# Patient Record
Sex: Male | Born: 1955
Health system: Southern US, Community
[De-identification: ages and names within clinical notes are randomized; demographics above are authoritative.]

## PROBLEM LIST (undated history)

## (undated) ENCOUNTER — Emergency Department (HOSPITAL_COMMUNITY): Discharge status: Absent without leave | Payer: Self-pay

## (undated) DIAGNOSIS — K219 Gastro-esophageal reflux disease without esophagitis: Secondary | ICD-10-CM

## (undated) DIAGNOSIS — R9431 Abnormal electrocardiogram [ECG] [EKG]: Secondary | ICD-10-CM

## (undated) DIAGNOSIS — R519 Headache, unspecified: Secondary | ICD-10-CM

## (undated) DIAGNOSIS — G4730 Sleep apnea, unspecified: Secondary | ICD-10-CM

## (undated) DIAGNOSIS — S02609A Fracture of mandible, unspecified, initial encounter for closed fracture: Secondary | ICD-10-CM

## (undated) DIAGNOSIS — F1910 Other psychoactive substance abuse, uncomplicated: Secondary | ICD-10-CM

## (undated) DIAGNOSIS — N289 Disorder of kidney and ureter, unspecified: Secondary | ICD-10-CM

## (undated) DIAGNOSIS — N189 Chronic kidney disease, unspecified: Secondary | ICD-10-CM

## (undated) DIAGNOSIS — I10 Essential (primary) hypertension: Secondary | ICD-10-CM

## (undated) DIAGNOSIS — B1920 Unspecified viral hepatitis C without hepatic coma: Secondary | ICD-10-CM

## (undated) HISTORY — PX: HAND SURGERY: SHX662

## (undated) HISTORY — DX: Unspecified viral hepatitis C without hepatic coma: B19.20

## (undated) HISTORY — PX: BRAIN SURGERY: SHX531

## (undated) HISTORY — PX: SALIVARY GLAND SURGERY: SHX768

---

## 2007-02-13 ENCOUNTER — Emergency Department (HOSPITAL_COMMUNITY): Admission: EM | Admit: 2007-02-13 | Discharge: 2007-02-13 | Payer: Self-pay | Admitting: Emergency Medicine

## 2008-11-22 ENCOUNTER — Emergency Department (HOSPITAL_COMMUNITY): Admission: EM | Admit: 2008-11-22 | Discharge: 2008-11-22 | Payer: Self-pay | Admitting: Emergency Medicine

## 2009-11-22 ENCOUNTER — Emergency Department (HOSPITAL_COMMUNITY): Admission: EM | Admit: 2009-11-22 | Discharge: 2009-11-23 | Payer: Self-pay | Admitting: Emergency Medicine

## 2009-11-28 ENCOUNTER — Inpatient Hospital Stay (HOSPITAL_COMMUNITY): Admission: EM | Admit: 2009-11-28 | Discharge: 2009-12-01 | Payer: Self-pay | Admitting: Emergency Medicine

## 2009-11-28 ENCOUNTER — Ambulatory Visit: Payer: Self-pay | Admitting: Cardiovascular Disease

## 2009-11-29 ENCOUNTER — Encounter (INDEPENDENT_AMBULATORY_CARE_PROVIDER_SITE_OTHER): Payer: Self-pay | Admitting: Internal Medicine

## 2009-12-08 ENCOUNTER — Ambulatory Visit: Payer: Self-pay | Admitting: Internal Medicine

## 2010-01-25 ENCOUNTER — Encounter: Payer: Self-pay | Admitting: Cardiovascular Disease

## 2010-01-25 ENCOUNTER — Ambulatory Visit: Payer: Self-pay | Admitting: Cardiovascular Disease

## 2010-01-25 ENCOUNTER — Inpatient Hospital Stay (HOSPITAL_COMMUNITY): Admission: EM | Admit: 2010-01-25 | Discharge: 2010-01-27 | Payer: Self-pay | Admitting: Emergency Medicine

## 2010-01-30 ENCOUNTER — Emergency Department (HOSPITAL_COMMUNITY): Admission: EM | Admit: 2010-01-30 | Discharge: 2010-01-30 | Payer: Self-pay | Admitting: Emergency Medicine

## 2010-02-01 ENCOUNTER — Encounter (INDEPENDENT_AMBULATORY_CARE_PROVIDER_SITE_OTHER): Payer: Self-pay | Admitting: *Deleted

## 2010-07-31 NOTE — Letter (Signed)
Summary: Appointment - Missed  [facility], [place]  [address] [place]   [place], [place] [address]   Phone: [phone]  Fax: [phone]     February 01, 2010 MRN: [id number]   Corey Guerrero [place], [place]  [address]   Dear [name],  [facility] records indicate you  need to make your post hospital appointment and to get lab work done with Dr. Burt Knack.  It is very important that we reach you to schedule this appointment. We look forward to participating in your health care needs. Please contact us at the number listed above at your earliest convenience to schedule this appointment.     Sincerely, Acupuncturist Team LG

## 2010-09-14 LAB — CBC
HCT: 37.9 % — ABNORMAL LOW (ref 39.0–52.0)
Hemoglobin: 12.7 g/dL — ABNORMAL LOW (ref 13.0–17.0)
MCH: 32.2 pg (ref 26.0–34.0)
MCHC: 33.5 g/dL (ref 30.0–36.0)
MCV: 95.9 fL (ref 78.0–100.0)
Platelets: 155 10*3/uL (ref 150–400)
RBC: 3.95 MIL/uL — ABNORMAL LOW (ref 4.22–5.81)
RDW: 12.1 % (ref 11.5–15.5)
WBC: 3.9 10*3/uL — ABNORMAL LOW (ref 4.0–10.5)

## 2010-09-14 LAB — POCT CARDIAC MARKERS
CKMB, poc: 3 ng/mL (ref 1.0–8.0)
Myoglobin, poc: 81.1 ng/mL (ref 12–200)
Troponin i, poc: <0.05 ng/mL (ref 0.00–0.09)

## 2010-09-14 LAB — BASIC METABOLIC PANEL
BUN: 18 mg/dL (ref 6–23)
CO2: 30 mEq/L (ref 19–32)
Calcium: 9.1 mg/dL (ref 8.4–10.5)
Chloride: 107 mEq/L (ref 96–112)
Creatinine, Ser: 1.4 mg/dL (ref 0.4–1.5)
GFR calc Af Amer: >60 mL/min (ref >60)
GFR calc non Af Amer: 53 mL/min — ABNORMAL LOW (ref >60)
Glucose, Bld: 104 mg/dL — ABNORMAL HIGH (ref 70–99)
Potassium: 4.1 mEq/L (ref 3.5–5.1)
Sodium: 142 mEq/L (ref 135–145)

## 2010-09-14 LAB — DIFFERENTIAL
Basophils Absolute: 0 10*3/uL (ref 0.0–0.1)
Basophils Relative: 0 % (ref 0–1)
Eosinophils Absolute: 0 10*3/uL (ref 0.0–0.7)
Eosinophils Relative: 1 % (ref 0–5)
Lymphocytes Relative: 26 % (ref 12–46)
Lymphs Abs: 1 10*3/uL (ref 0.7–4.0)
Monocytes Absolute: 0.4 10*3/uL (ref 0.1–1.0)
Monocytes Relative: 9 % (ref 3–12)
Neutro Abs: 2.5 10*3/uL (ref 1.7–7.7)
Neutrophils Relative %: 64 % (ref 43–77)

## 2010-09-15 LAB — CBC
HCT: 35.5 % — ABNORMAL LOW (ref 39.0–52.0)
HCT: 40 % (ref 39.0–52.0)
Hemoglobin: 12.1 g/dL — ABNORMAL LOW (ref 13.0–17.0)
Hemoglobin: 13.6 g/dL (ref 13.0–17.0)
MCH: 33.4 pg (ref 26.0–34.0)
MCH: 33.4 pg (ref 26.0–34.0)
MCHC: 33.9 g/dL (ref 30.0–36.0)
MCHC: 34.1 g/dL (ref 30.0–36.0)
MCV: 97.8 fL (ref 78.0–100.0)
MCV: 98.6 fL (ref 78.0–100.0)
Platelets: 134 10*3/uL — ABNORMAL LOW (ref 150–400)
Platelets: 156 10*3/uL (ref 150–400)
RBC: 3.63 MIL/uL — ABNORMAL LOW (ref 4.22–5.81)
RBC: 4.06 MIL/uL — ABNORMAL LOW (ref 4.22–5.81)
RDW: 12.8 % (ref 11.5–15.5)
RDW: 12.8 % (ref 11.5–15.5)
WBC: 4.3 10*3/uL (ref 4.0–10.5)
WBC: 4.8 10*3/uL (ref 4.0–10.5)

## 2010-09-15 LAB — CARDIAC PANEL(CRET KIN+CKTOT+MB+TROPI)
CK, MB: 4.1 ng/mL — ABNORMAL HIGH (ref 0.3–4.0)
CK, MB: 4.8 ng/mL — ABNORMAL HIGH (ref 0.3–4.0)
Relative Index: 1.4 (ref 0.0–2.5)
Relative Index: 1.6 (ref 0.0–2.5)
Total CK: 258 U/L — ABNORMAL HIGH (ref 7–232)
Total CK: 338 U/L — ABNORMAL HIGH (ref 7–232)
Troponin I: 0.04 ng/mL (ref 0.00–0.06)
Troponin I: 0.05 ng/mL (ref 0.00–0.06)

## 2010-09-15 LAB — RAPID URINE DRUG SCREEN, HOSP PERFORMED
Amphetamines: NOT DETECTED
Barbiturates: NOT DETECTED
Benzodiazepines: NOT DETECTED
Cocaine: POSITIVE — AB
Opiates: POSITIVE — AB
Tetrahydrocannabinol: NOT DETECTED

## 2010-09-15 LAB — POCT I-STAT, CHEM 8
BUN: 16 mg/dL (ref 6–23)
Calcium, Ion: 1.03 mmol/L — ABNORMAL LOW (ref 1.12–1.32)
Chloride: 105 mEq/L (ref 96–112)
Creatinine, Ser: 1.5 mg/dL (ref 0.4–1.5)
Glucose, Bld: 127 mg/dL — ABNORMAL HIGH (ref 70–99)
HCT: 42 % (ref 39.0–52.0)
Hemoglobin: 14.3 g/dL (ref 13.0–17.0)
Potassium: 3.6 mEq/L (ref 3.5–5.1)
Sodium: 142 mEq/L (ref 135–145)
TCO2: 28 mmol/L (ref 0–100)

## 2010-09-15 LAB — BASIC METABOLIC PANEL
BUN: 11 mg/dL (ref 6–23)
BUN: 11 mg/dL (ref 6–23)
BUN: 13 mg/dL (ref 6–23)
BUN: 13 mg/dL (ref 6–23)
CO2: 28 mEq/L (ref 19–32)
CO2: 28 mEq/L (ref 19–32)
CO2: 28 mEq/L (ref 19–32)
CO2: 31 mEq/L (ref 19–32)
Calcium: 8.4 mg/dL (ref 8.4–10.5)
Calcium: 8.9 mg/dL (ref 8.4–10.5)
Calcium: 8.9 mg/dL (ref 8.4–10.5)
Calcium: 9.1 mg/dL (ref 8.4–10.5)
Chloride: 104 mEq/L (ref 96–112)
Chloride: 96 mEq/L (ref 96–112)
Chloride: 97 mEq/L (ref 96–112)
Chloride: 99 mEq/L (ref 96–112)
Creatinine, Ser: 1.31 mg/dL (ref 0.4–1.5)
Creatinine, Ser: 1.35 mg/dL (ref 0.4–1.5)
Creatinine, Ser: 1.41 mg/dL (ref 0.4–1.5)
Creatinine, Ser: 1.53 mg/dL — ABNORMAL HIGH (ref 0.4–1.5)
GFR calc Af Amer: 58 mL/min — ABNORMAL LOW (ref >60)
GFR calc Af Amer: >60 mL/min (ref >60)
GFR calc Af Amer: >60 mL/min (ref >60)
GFR calc Af Amer: >60 mL/min (ref >60)
GFR calc non Af Amer: 48 mL/min — ABNORMAL LOW (ref >60)
GFR calc non Af Amer: 53 mL/min — ABNORMAL LOW (ref >60)
GFR calc non Af Amer: 56 mL/min — ABNORMAL LOW (ref >60)
GFR calc non Af Amer: 57 mL/min — ABNORMAL LOW (ref >60)
Glucose, Bld: 132 mg/dL — ABNORMAL HIGH (ref 70–99)
Glucose, Bld: 93 mg/dL (ref 70–99)
Glucose, Bld: 96 mg/dL (ref 70–99)
Glucose, Bld: 99 mg/dL (ref 70–99)
Potassium: 2.9 mEq/L — ABNORMAL LOW (ref 3.5–5.1)
Potassium: 3.3 mEq/L — ABNORMAL LOW (ref 3.5–5.1)
Potassium: 3.5 mEq/L (ref 3.5–5.1)
Potassium: 3.6 mEq/L (ref 3.5–5.1)
Sodium: 131 mEq/L — ABNORMAL LOW (ref 135–145)
Sodium: 133 mEq/L — ABNORMAL LOW (ref 135–145)
Sodium: 135 mEq/L (ref 135–145)
Sodium: 140 mEq/L (ref 135–145)

## 2010-09-15 LAB — CK TOTAL AND CKMB (NOT AT ARMC)
CK, MB: 6.1 ng/mL (ref 0.3–4.0)
Relative Index: 1.3 (ref 0.0–2.5)
Total CK: 471 U/L — ABNORMAL HIGH (ref 7–232)

## 2010-09-15 LAB — POCT CARDIAC MARKERS
CKMB, poc: 2.8 ng/mL (ref 1.0–8.0)
Myoglobin, poc: 120 ng/mL (ref 12–200)
Troponin i, poc: <0.05 ng/mL (ref 0.00–0.09)

## 2010-09-15 LAB — DIFFERENTIAL
Basophils Absolute: 0 10*3/uL (ref 0.0–0.1)
Basophils Relative: 0 % (ref 0–1)
Eosinophils Absolute: 0 10*3/uL (ref 0.0–0.7)
Eosinophils Relative: 1 % (ref 0–5)
Lymphocytes Relative: 27 % (ref 12–46)
Lymphs Abs: 1.3 10*3/uL (ref 0.7–4.0)
Monocytes Absolute: 0.3 10*3/uL (ref 0.1–1.0)
Monocytes Relative: 7 % (ref 3–12)
Neutro Abs: 3.1 10*3/uL (ref 1.7–7.7)
Neutrophils Relative %: 65 % (ref 43–77)

## 2010-09-15 LAB — MRSA PCR SCREENING: MRSA by PCR: NEGATIVE

## 2010-09-15 LAB — TROPONIN I: Troponin I: 0.05 ng/mL (ref 0.00–0.06)

## 2010-09-17 LAB — BASIC METABOLIC PANEL
BUN: 10 mg/dL (ref 6–23)
BUN: 16 mg/dL (ref 6–23)
BUN: 8 mg/dL (ref 6–23)
BUN: 34 mg/dL — ABNORMAL HIGH (ref 6–23)
CO2: 30 mEq/L (ref 19–32)
CO2: 33 mEq/L — ABNORMAL HIGH (ref 19–32)
CO2: 34 mEq/L — ABNORMAL HIGH (ref 19–32)
CO2: 28 mEq/L (ref 19–32)
Calcium: 8.7 mg/dL (ref 8.4–10.5)
Calcium: 9.4 mg/dL (ref 8.4–10.5)
Calcium: 9.5 mg/dL (ref 8.4–10.5)
Calcium: 9.5 mg/dL (ref 8.4–10.5)
Chloride: 100 mEq/L (ref 96–112)
Chloride: 101 mEq/L (ref 96–112)
Chloride: 98 mEq/L (ref 96–112)
Chloride: 95 mEq/L — ABNORMAL LOW (ref 96–112)
Creatinine, Ser: 1.44 mg/dL (ref 0.4–1.5)
Creatinine, Ser: 1.53 mg/dL — ABNORMAL HIGH (ref 0.4–1.5)
Creatinine, Ser: 1.58 mg/dL — ABNORMAL HIGH (ref 0.4–1.5)
Creatinine, Ser: 3.48 mg/dL — ABNORMAL HIGH (ref 0.4–1.5)
GFR calc Af Amer: 56 mL/min — ABNORMAL LOW (ref >60)
GFR calc Af Amer: 58 mL/min — ABNORMAL LOW (ref >60)
GFR calc Af Amer: >60 mL/min (ref >60)
GFR calc Af Amer: 23 mL/min — ABNORMAL LOW (ref >60)
GFR calc non Af Amer: 46 mL/min — ABNORMAL LOW (ref >60)
GFR calc non Af Amer: 48 mL/min — ABNORMAL LOW (ref >60)
GFR calc non Af Amer: 52 mL/min — ABNORMAL LOW (ref >60)
GFR calc non Af Amer: 19 mL/min — ABNORMAL LOW (ref >60)
Glucose, Bld: 76 mg/dL (ref 70–99)
Glucose, Bld: 91 mg/dL (ref 70–99)
Glucose, Bld: 97 mg/dL (ref 70–99)
Glucose, Bld: 96 mg/dL (ref 70–99)
Potassium: 3.5 mEq/L (ref 3.5–5.1)
Potassium: 3.5 mEq/L (ref 3.5–5.1)
Potassium: 3.6 mEq/L (ref 3.5–5.1)
Potassium: 4.2 mEq/L (ref 3.5–5.1)
Sodium: 137 mEq/L (ref 135–145)
Sodium: 137 mEq/L (ref 135–145)
Sodium: 139 mEq/L (ref 135–145)
Sodium: 135 mEq/L (ref 135–145)

## 2010-09-17 LAB — COMPREHENSIVE METABOLIC PANEL
ALT: 33 U/L (ref 0–53)
AST: 49 U/L — ABNORMAL HIGH (ref 0–37)
Albumin: 4.3 g/dL (ref 3.5–5.2)
Alkaline Phosphatase: 51 U/L (ref 39–117)
BUN: 18 mg/dL (ref 6–23)
CO2: 26 mEq/L (ref 19–32)
Calcium: 8.9 mg/dL (ref 8.4–10.5)
Chloride: 109 mEq/L (ref 96–112)
Creatinine, Ser: 1.65 mg/dL — ABNORMAL HIGH (ref 0.4–1.5)
GFR calc Af Amer: 53 mL/min — ABNORMAL LOW (ref >60)
GFR calc non Af Amer: 44 mL/min — ABNORMAL LOW (ref >60)
Glucose, Bld: 96 mg/dL (ref 70–99)
Potassium: 3.4 mEq/L — ABNORMAL LOW (ref 3.5–5.1)
Sodium: 143 mEq/L (ref 135–145)
Total Bilirubin: 0.6 mg/dL (ref 0.3–1.2)
Total Protein: 7.8 g/dL (ref 6.0–8.3)

## 2010-09-17 LAB — CARDIAC PANEL(CRET KIN+CKTOT+MB+TROPI)
CK, MB: 4.4 ng/mL — ABNORMAL HIGH (ref 0.3–4.0)
CK, MB: 8.9 ng/mL (ref 0.3–4.0)
Relative Index: 0.6 (ref 0.0–2.5)
Relative Index: 1.2 (ref 0.0–2.5)
Total CK: 715 U/L — ABNORMAL HIGH (ref 7–232)
Total CK: 741 U/L — ABNORMAL HIGH (ref 7–232)
Troponin I: 0.05 ng/mL (ref 0.00–0.06)
Troponin I: 0.06 ng/mL (ref 0.00–0.06)

## 2010-09-17 LAB — RAPID URINE DRUG SCREEN, HOSP PERFORMED
Amphetamines: NOT DETECTED
Barbiturates: NOT DETECTED
Benzodiazepines: NOT DETECTED
Cocaine: POSITIVE — AB
Opiates: POSITIVE — AB
Tetrahydrocannabinol: NOT DETECTED

## 2010-09-17 LAB — CBC
HCT: 34.5 % — ABNORMAL LOW (ref 39.0–52.0)
HCT: 38.9 % — ABNORMAL LOW (ref 39.0–52.0)
HCT: 41.2 % (ref 39.0–52.0)
Hemoglobin: 11.6 g/dL — ABNORMAL LOW (ref 13.0–17.0)
Hemoglobin: 12.8 g/dL — ABNORMAL LOW (ref 13.0–17.0)
Hemoglobin: 13.7 g/dL (ref 13.0–17.0)
MCHC: 33.1 g/dL (ref 30.0–36.0)
MCHC: 32.9 g/dL (ref 30.0–36.0)
MCHC: 33.3 g/dL (ref 30.0–36.0)
MCV: 98.9 fL (ref 78.0–100.0)
MCV: 98.8 fL (ref 78.0–100.0)
MCV: 99 fL (ref 78.0–100.0)
Platelets: 157 10*3/uL (ref 150–400)
Platelets: 180 10*3/uL (ref 150–400)
Platelets: 216 10*3/uL (ref 150–400)
RBC: 3.49 MIL/uL — ABNORMAL LOW (ref 4.22–5.81)
RBC: 3.93 MIL/uL — ABNORMAL LOW (ref 4.22–5.81)
RBC: 4.17 MIL/uL — ABNORMAL LOW (ref 4.22–5.81)
RDW: 13.1 % (ref 11.5–15.5)
RDW: 13 % (ref 11.5–15.5)
RDW: 13.1 % (ref 11.5–15.5)
WBC: 5.2 10*3/uL (ref 4.0–10.5)
WBC: 6.1 10*3/uL (ref 4.0–10.5)
WBC: 7.9 10*3/uL (ref 4.0–10.5)

## 2010-09-17 LAB — CK TOTAL AND CKMB (NOT AT ARMC)
CK, MB: 9.1 ng/mL (ref 0.3–4.0)
Relative Index: 1.1 (ref 0.0–2.5)
Total CK: 813 U/L — ABNORMAL HIGH (ref 7–232)

## 2010-09-17 LAB — APTT: aPTT: 28 seconds (ref 24–37)

## 2010-09-17 LAB — ETHANOL: Alcohol, Ethyl (B): 114 mg/dL — ABNORMAL HIGH (ref 0–10)

## 2010-09-17 LAB — DIFFERENTIAL
Basophils Absolute: 0 10*3/uL (ref 0.0–0.1)
Basophils Relative: 0 % (ref 0–1)
Eosinophils Absolute: 0 10*3/uL (ref 0.0–0.7)
Eosinophils Relative: 0 % (ref 0–5)
Lymphocytes Relative: 22 % (ref 12–46)
Lymphs Abs: 1.7 10*3/uL (ref 0.7–4.0)
Monocytes Absolute: 0.7 10*3/uL (ref 0.1–1.0)
Monocytes Relative: 8 % (ref 3–12)
Neutro Abs: 5.5 10*3/uL (ref 1.7–7.7)
Neutrophils Relative %: 69 % (ref 43–77)

## 2010-09-17 LAB — URINALYSIS, ROUTINE W REFLEX MICROSCOPIC
Glucose, UA: NEGATIVE mg/dL
Ketones, ur: NEGATIVE mg/dL
Leukocytes, UA: NEGATIVE
Nitrite: NEGATIVE
Protein, ur: 100 mg/dL — AB
Specific Gravity, Urine: 1.037 — ABNORMAL HIGH (ref 1.005–1.030)
Urobilinogen, UA: 0.2 mg/dL (ref 0.0–1.0)
pH: 5 (ref 5.0–8.0)

## 2010-09-17 LAB — URINE MICROSCOPIC-ADD ON

## 2010-09-17 LAB — PROTIME-INR
INR: 1.03 (ref 0.00–1.49)
Prothrombin Time: 13.4 seconds (ref 11.6–15.2)

## 2010-09-17 LAB — POCT CARDIAC MARKERS
CKMB, poc: 9.6 ng/mL (ref 1.0–8.0)
Myoglobin, poc: >500 ng/mL (ref 12–200)
Troponin i, poc: <0.05 ng/mL (ref 0.00–0.09)

## 2010-09-17 LAB — TROPONIN I: Troponin I: 0.1 ng/mL — ABNORMAL HIGH (ref 0.00–0.06)

## 2010-09-17 LAB — SAMPLE TO BLOOD BANK

## 2010-10-09 LAB — RAPID STREP SCREEN (MED CTR MEBANE ONLY): Streptococcus, Group A Screen (Direct): NEGATIVE

## 2011-05-20 ENCOUNTER — Other Ambulatory Visit: Payer: Self-pay

## 2011-05-20 ENCOUNTER — Encounter: Payer: Self-pay | Admitting: *Deleted

## 2011-05-20 ENCOUNTER — Inpatient Hospital Stay (HOSPITAL_COMMUNITY)
Admission: EM | Admit: 2011-05-20 | Discharge: 2011-05-28 | DRG: 683 | Disposition: A | Discharge status: Other Acute Inpatient Hospital | Payer: Self-pay | Attending: Internal Medicine | Admitting: Internal Medicine

## 2011-05-20 DIAGNOSIS — F1010 Alcohol abuse, uncomplicated: Secondary | ICD-10-CM

## 2011-05-20 DIAGNOSIS — N183 Chronic kidney disease, stage 3 unspecified: Secondary | ICD-10-CM | POA: Diagnosis present

## 2011-05-20 DIAGNOSIS — I10 Essential (primary) hypertension: Secondary | ICD-10-CM | POA: Diagnosis present

## 2011-05-20 DIAGNOSIS — F1410 Cocaine abuse, uncomplicated: Secondary | ICD-10-CM | POA: Diagnosis present

## 2011-05-20 DIAGNOSIS — E876 Hypokalemia: Secondary | ICD-10-CM | POA: Diagnosis present

## 2011-05-20 DIAGNOSIS — R748 Abnormal levels of other serum enzymes: Secondary | ICD-10-CM | POA: Diagnosis present

## 2011-05-20 DIAGNOSIS — I129 Hypertensive chronic kidney disease with stage 1 through stage 4 chronic kidney disease, or unspecified chronic kidney disease: Principal | ICD-10-CM | POA: Diagnosis present

## 2011-05-20 DIAGNOSIS — Z9119 Patient's noncompliance with other medical treatment and regimen: Secondary | ICD-10-CM

## 2011-05-20 DIAGNOSIS — R9431 Abnormal electrocardiogram [ECG] [EKG]: Secondary | ICD-10-CM | POA: Diagnosis present

## 2011-05-20 DIAGNOSIS — Z91199 Patient's noncompliance with other medical treatment and regimen due to unspecified reason: Secondary | ICD-10-CM

## 2011-05-20 DIAGNOSIS — N289 Disorder of kidney and ureter, unspecified: Secondary | ICD-10-CM

## 2011-05-20 DIAGNOSIS — N179 Acute kidney failure, unspecified: Secondary | ICD-10-CM | POA: Diagnosis present

## 2011-05-20 DIAGNOSIS — F1910 Other psychoactive substance abuse, uncomplicated: Secondary | ICD-10-CM | POA: Diagnosis present

## 2011-05-20 DIAGNOSIS — R799 Abnormal finding of blood chemistry, unspecified: Secondary | ICD-10-CM | POA: Diagnosis present

## 2011-05-20 DIAGNOSIS — F1210 Cannabis abuse, uncomplicated: Secondary | ICD-10-CM | POA: Diagnosis present

## 2011-05-20 DIAGNOSIS — I161 Hypertensive emergency: Secondary | ICD-10-CM

## 2011-05-20 DIAGNOSIS — Z0189 Encounter for other specified special examinations: Secondary | ICD-10-CM

## 2011-05-20 HISTORY — DX: Abnormal electrocardiogram (ECG) (EKG): R94.31

## 2011-05-20 HISTORY — DX: Fracture of mandible, unspecified, initial encounter for closed fracture: S02.609A

## 2011-05-20 HISTORY — DX: Disorder of kidney and ureter, unspecified: N28.9

## 2011-05-20 HISTORY — DX: Other psychoactive substance abuse, uncomplicated: F19.10

## 2011-05-20 HISTORY — DX: Essential (primary) hypertension: I10

## 2011-05-20 LAB — DIFFERENTIAL
Basophils Absolute: 0 10*3/uL (ref 0.0–0.1)
Basophils Relative: 1 % (ref 0–1)
Eosinophils Absolute: 0.1 10*3/uL (ref 0.0–0.7)
Eosinophils Relative: 1 % (ref 0–5)
Lymphocytes Relative: 30 % (ref 12–46)
Lymphs Abs: 1.3 10*3/uL (ref 0.7–4.0)
Monocytes Absolute: 0.3 10*3/uL (ref 0.1–1.0)
Monocytes Relative: 7 % (ref 3–12)
Neutro Abs: 2.6 10*3/uL (ref 1.7–7.7)
Neutrophils Relative %: 61 % (ref 43–77)

## 2011-05-20 LAB — COMPREHENSIVE METABOLIC PANEL
ALT: 30 U/L (ref 0–53)
AST: 47 U/L — ABNORMAL HIGH (ref 0–37)
Albumin: 3.5 g/dL (ref 3.5–5.2)
Alkaline Phosphatase: 54 U/L (ref 39–117)
BUN: 28 mg/dL — ABNORMAL HIGH (ref 6–23)
CO2: 32 mEq/L (ref 19–32)
Calcium: 9.3 mg/dL (ref 8.4–10.5)
Chloride: 96 mEq/L (ref 96–112)
Creatinine, Ser: 2.72 mg/dL — ABNORMAL HIGH (ref 0.50–1.35)
GFR calc Af Amer: 29 mL/min — ABNORMAL LOW (ref >90)
GFR calc non Af Amer: 25 mL/min — ABNORMAL LOW (ref >90)
Glucose, Bld: 101 mg/dL — ABNORMAL HIGH (ref 70–99)
Potassium: 3.1 mEq/L — ABNORMAL LOW (ref 3.5–5.1)
Sodium: 135 mEq/L (ref 135–145)
Total Bilirubin: 0.5 mg/dL (ref 0.3–1.2)
Total Protein: 7.3 g/dL (ref 6.0–8.3)

## 2011-05-20 LAB — RAPID URINE DRUG SCREEN, HOSP PERFORMED
Amphetamines: NOT DETECTED
Barbiturates: NOT DETECTED
Benzodiazepines: NOT DETECTED
Cocaine: POSITIVE — AB
Opiates: NOT DETECTED
Tetrahydrocannabinol: NOT DETECTED

## 2011-05-20 LAB — CBC
HCT: 38.7 % — ABNORMAL LOW (ref 39.0–52.0)
HCT: 38.9 % — ABNORMAL LOW (ref 39.0–52.0)
Hemoglobin: 13.4 g/dL (ref 13.0–17.0)
Hemoglobin: 13.4 g/dL (ref 13.0–17.0)
MCH: 33.3 pg (ref 26.0–34.0)
MCH: 33.2 pg (ref 26.0–34.0)
MCHC: 34.6 g/dL (ref 30.0–36.0)
MCHC: 34.4 g/dL (ref 30.0–36.0)
MCV: 96.3 fL (ref 78.0–100.0)
MCV: 96.3 fL (ref 78.0–100.0)
Platelets: 139 10*3/uL — ABNORMAL LOW (ref 150–400)
Platelets: 142 10*3/uL — ABNORMAL LOW (ref 150–400)
RBC: 4.02 MIL/uL — ABNORMAL LOW (ref 4.22–5.81)
RBC: 4.04 MIL/uL — ABNORMAL LOW (ref 4.22–5.81)
RDW: 12.5 % (ref 11.5–15.5)
RDW: 12.6 % (ref 11.5–15.5)
WBC: 4.3 10*3/uL (ref 4.0–10.5)
WBC: 5.5 10*3/uL (ref 4.0–10.5)

## 2011-05-20 LAB — URINALYSIS, ROUTINE W REFLEX MICROSCOPIC
Bilirubin Urine: NEGATIVE
Glucose, UA: 100 mg/dL — AB
Ketones, ur: NEGATIVE mg/dL
Leukocytes, UA: NEGATIVE
Nitrite: NEGATIVE
Protein, ur: 100 mg/dL — AB
Specific Gravity, Urine: 1.021 (ref 1.005–1.030)
Urobilinogen, UA: 1 mg/dL (ref 0.0–1.0)
pH: 6.5 (ref 5.0–8.0)

## 2011-05-20 LAB — URINE MICROSCOPIC-ADD ON

## 2011-05-20 LAB — CREATININE, SERUM
Creatinine, Ser: 2.48 mg/dL — ABNORMAL HIGH (ref 0.50–1.35)
GFR calc Af Amer: 32 mL/min — ABNORMAL LOW (ref >90)
GFR calc non Af Amer: 28 mL/min — ABNORMAL LOW (ref >90)

## 2011-05-20 LAB — CARDIAC PANEL(CRET KIN+CKTOT+MB+TROPI)
CK, MB: 7.5 ng/mL (ref 0.3–4.0)
Relative Index: 1.7 (ref 0.0–2.5)
Total CK: 434 U/L — ABNORMAL HIGH (ref 7–232)
Troponin I: <0.3 ng/mL (ref <0.30)

## 2011-05-20 LAB — ETHANOL: Alcohol, Ethyl (B): <11 mg/dL (ref 0–11)

## 2011-05-20 MED ORDER — VITAMIN B-1 100 MG PO TABS
100.0000 mg | ORAL_TABLET | Freq: Every day | ORAL | Status: DC
  Administered 2011-05-20 – 2011-05-28 (×9): 100 mg via ORAL
  Filled 2011-05-20 (×9): qty 1

## 2011-05-20 MED ORDER — FOLIC ACID 1 MG PO TABS
1.0000 mg | ORAL_TABLET | Freq: Every day | ORAL | Status: DC
  Administered 2011-05-20 – 2011-05-28 (×9): 1 mg via ORAL
  Filled 2011-05-20 (×9): qty 1

## 2011-05-20 MED ORDER — POTASSIUM CHLORIDE CRYS ER 20 MEQ PO TBCR
40.0000 meq | EXTENDED_RELEASE_TABLET | Freq: Once | ORAL | Status: AC
  Administered 2011-05-20: 40 meq via ORAL
  Filled 2011-05-20: qty 2

## 2011-05-20 MED ORDER — NICARDIPINE HCL IN NACL 20-0.86 MG/200ML-% IV SOLN
5.0000 mg/h | INTRAVENOUS | Status: DC
  Administered 2011-05-20: 5 mg/h via INTRAVENOUS
  Administered 2011-05-20: 15 mg/h via INTRAVENOUS
  Administered 2011-05-21 (×2): 5 mg/h via INTRAVENOUS
  Administered 2011-05-21: 2.5 mg/h via INTRAVENOUS
  Administered 2011-05-21: 5 mg/h via INTRAVENOUS
  Administered 2011-05-22: 2 mg/h via INTRAVENOUS
  Filled 2011-05-20 (×6): qty 200

## 2011-05-20 MED ORDER — TRAZODONE HCL 50 MG PO TABS
50.0000 mg | ORAL_TABLET | Freq: Every evening | ORAL | Status: DC | PRN
  Administered 2011-05-20 – 2011-05-22 (×2): 50 mg via ORAL
  Filled 2011-05-20 (×2): qty 1

## 2011-05-20 MED ORDER — HYDROCHLOROTHIAZIDE 12.5 MG PO CAPS
12.5000 mg | ORAL_CAPSULE | ORAL | Status: AC
  Administered 2011-05-20: 12.5 mg via ORAL
  Filled 2011-05-20: qty 1

## 2011-05-20 MED ORDER — ACETAMINOPHEN 325 MG PO TABS
650.0000 mg | ORAL_TABLET | Freq: Four times a day (QID) | ORAL | Status: DC | PRN
  Administered 2011-05-20 – 2011-05-24 (×3): 650 mg via ORAL
  Filled 2011-05-20 (×3): qty 2

## 2011-05-20 MED ORDER — ACETAMINOPHEN 650 MG RE SUPP: 650.0000 mg | Freq: Four times a day (QID) | RECTAL | Status: DC | PRN

## 2011-05-20 MED ORDER — HEPARIN SODIUM (PORCINE) 5000 UNIT/ML IJ SOLN
5000.0000 [IU] | Freq: Three times a day (TID) | INTRAMUSCULAR | Status: DC
  Administered 2011-05-20 – 2011-05-28 (×23): 5000 [IU] via SUBCUTANEOUS
  Filled 2011-05-20 (×30): qty 1

## 2011-05-20 MED ORDER — DILTIAZEM HCL 100 MG IV SOLR
5.0000 mg/h | Freq: Once | INTRAVENOUS | Status: AC
  Administered 2011-05-20: 5 mg/h via INTRAVENOUS
  Filled 2011-05-20: qty 100

## 2011-05-20 MED ORDER — HYDRALAZINE HCL 20 MG/ML IJ SOLN
10.0000 mg | Freq: Once | INTRAMUSCULAR | Status: AC
  Administered 2011-05-20: 20 mg via INTRAVENOUS
  Filled 2011-05-20: qty 0.5

## 2011-05-20 NOTE — ED Notes (Signed)
Next set of cardiac enzymes at [facility]

## 2011-05-20 NOTE — ED Notes (Signed)
Verified cardene gtt with secondary rn.
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
## 2011-05-20 NOTE — ED Notes (Signed)
Pt has been wanded by security. 
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
## 2011-05-20 NOTE — ED Notes (Addendum)
Pt in NAD. Eating a snack. Used the bathroom. Systolic BP still 0000000 upon last check. Cardizem drip still running.
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
## 2011-05-20 NOTE — ED Notes (Signed)
cardene gtt increased to 10/hr per guidelines to keep sbp less than 160

## 2011-05-20 NOTE — ED Notes (Signed)
Pt states [facility] sent him over for medical clearance. Pt states he wants detox for alcohol and substance abuse

## 2011-05-21 ENCOUNTER — Other Ambulatory Visit: Payer: Self-pay

## 2011-05-21 LAB — CBC
HCT: 37.2 % — ABNORMAL LOW (ref 39.0–52.0)
Hemoglobin: 13.2 g/dL (ref 13.0–17.0)
MCH: 33.4 pg (ref 26.0–34.0)
MCHC: 35.5 g/dL (ref 30.0–36.0)
MCV: 94.2 fL (ref 78.0–100.0)
Platelets: 138 10*3/uL — ABNORMAL LOW (ref 150–400)
RBC: 3.95 MIL/uL — ABNORMAL LOW (ref 4.22–5.81)
RDW: 12.5 % (ref 11.5–15.5)
WBC: 6.6 10*3/uL (ref 4.0–10.5)

## 2011-05-21 LAB — CARDIAC PANEL(CRET KIN+CKTOT+MB+TROPI)
CK, MB: 6.5 ng/mL (ref 0.3–4.0)
CK, MB: 7.4 ng/mL (ref 0.3–4.0)
CK, MB: 7.5 ng/mL (ref 0.3–4.0)
Relative Index: 2.7 — ABNORMAL HIGH (ref 0.0–2.5)
Relative Index: 3 — ABNORMAL HIGH (ref 0.0–2.5)
Relative Index: 3.1 — ABNORMAL HIGH (ref 0.0–2.5)
Total CK: 238 U/L — ABNORMAL HIGH (ref 7–232)
Total CK: 248 U/L — ABNORMAL HIGH (ref 7–232)
Total CK: 244 U/L — ABNORMAL HIGH (ref 7–232)
Troponin I: 0.55 ng/mL (ref <0.30)
Troponin I: 0.72 ng/mL (ref <0.30)
Troponin I: 0.76 ng/mL (ref <0.30)

## 2011-05-21 LAB — MRSA PCR SCREENING: MRSA by PCR: NEGATIVE

## 2011-05-21 MED ORDER — PROMETHAZINE HCL 25 MG PO TABS: 12.5000 mg | ORAL_TABLET | Freq: Four times a day (QID) | ORAL | Status: DC | PRN

## 2011-05-21 MED ORDER — OXYCODONE HCL 5 MG PO TABS
5.0000 mg | ORAL_TABLET | Freq: Once | ORAL | Status: AC
  Administered 2011-05-22: 5 mg via ORAL
  Filled 2011-05-21: qty 1

## 2011-05-21 MED ORDER — BISACODYL 10 MG RE SUPP: 10.0000 mg | Freq: Every day | RECTAL | Status: DC | PRN

## 2011-05-21 MED ORDER — LIDOCAINE VISCOUS 2 % MT SOLN
20.0000 mL | OROMUCOSAL | Status: DC | PRN
  Administered 2011-05-21 – 2011-05-26 (×5): 20 mL via OROMUCOSAL
  Filled 2011-05-21 (×3): qty 20

## 2011-05-21 MED ORDER — SODIUM CHLORIDE 0.45 % IV SOLN
INTRAVENOUS | Status: DC
  Administered 2011-05-21: 15:00:00 via INTRAVENOUS

## 2011-05-21 MED ORDER — LORAZEPAM 2 MG/ML IJ SOLN
2.0000 mg | INTRAMUSCULAR | Status: DC | PRN
  Administered 2011-05-26 – 2011-05-27 (×4): 2 mg via INTRAVENOUS
  Filled 2011-05-21 (×4): qty 1

## 2011-05-21 MED ORDER — ASPIRIN EC 81 MG PO TBEC
81.0000 mg | DELAYED_RELEASE_TABLET | Freq: Every day | ORAL | Status: DC
  Administered 2011-05-21 – 2011-05-28 (×8): 81 mg via ORAL
  Filled 2011-05-21 (×8): qty 1

## 2011-05-21 MED ORDER — PROMETHAZINE HCL 25 MG/ML IJ SOLN
12.5000 mg | Freq: Four times a day (QID) | INTRAMUSCULAR | Status: DC | PRN
  Filled 2011-05-21: qty 1

## 2011-05-21 NOTE — Discharge Planning (Signed)
SDU 1235 Pt updated about [facility]

## 2011-05-21 NOTE — ED Notes (Signed)
Started cardene gtt back. Patient sbp over 160.

## 2011-05-21 NOTE — ED Notes (Signed)
Notifying md.

## 2011-05-21 NOTE — ED Notes (Signed)
Cardene drip restarted @5mg  /hr

## 2011-05-21 NOTE — ED Notes (Signed)
Turned cardene off.  Patient sbp below 160

## 2011-05-21 NOTE — ED Notes (Signed)
Critical lab....  Troponin 0.55 and mb 6.5

## 2011-05-21 NOTE — ED Notes (Signed)
Decreased cardene to 5 mg/hr

## 2011-05-21 NOTE — Discharge Planning (Signed)
Pt referred to and seen by [facility] representative about [facility] services.  Corey Guerrero voiced concern that pt states he is scheduled to be at [facility] recovery services on 05/22/11 at 8 am for detox.  CM spoke to [place], [place] team member to see if pt has been followed during ED visit.  He has not been referred to [facility].  Discussed concerns about scheduled appointment (appt).  CM spoke with pt who provided a card from [facility] confirming an appt on 05/22/11 at 8 am.

## 2011-05-21 NOTE — ED Notes (Signed)
Spoke to [place] regarding pt's plan of care with residential substance abuse services at [facility]. Writer assisted with pt's plan of care by calling [facility] [phone] to verify pt's scheduled appointment. Spoke with nursing staff Butch Penny at [facility] and she verified that pt's appointment is schedule 05/22/2011 @ 8 am. Pt will need to plan on starting the program 05/22/11 and his bed will be available on this day as well. Butch Penny sts that if pt is NOT able to keep his scheduled appointment he will need to contact [facility] in the morning so that he is not noted in their system as a "No Show".   Waldon Merl, MS (Assessment Counselor)

## 2011-05-21 NOTE — Progress Notes (Signed)
Subjective: Alert & orientedX3, denies CP, headaches, no SOB. Objective: Vital signs in last 24 hours: Temp:  [98 F (36.7 C)-98.7 F (37.1 C)] 98 F (36.7 C) (11/20 1600) Pulse Rate:  [41-78] 75  (11/20 1800) Resp:  [13-24] 16  (11/20 1800) BP: (100-198)/(51-115) 170/101 mmHg (11/20 1800) SpO2:  [95 %-100 %] 100 % (11/20 1800) Weight:  [67.9 kg (149 lb 11.1 oz)] 149 lb 11.1 oz (67.9 kg) (11/20 1600) Last BM Date: 05/20/11 Intake/Output from previous day: 11/19 0701 - 11/20 0700 In: -  Out: 1110 [Urine:1110] Intake/Output this shift: Total I/O In: 415 [P.O.:120; I.V.:295] Out: 1100 [Urine:1100]    General Appearance:    Alert, cooperative, no distress, appears stated age  Lungs:     Clear to auscultation bilaterally, respirations unlabored   Heart:    Regular rate and rhythm, S1 and S2 normal, no murmur, rub   or gallop  Abdomen:     Soft, non-tender, bowel sounds active all four quadrants,    no masses, no organomegaly  Extremities:   Extremities normal, atraumatic, no cyanosis or edema       Weight change:   Intake/Output Summary (Last 24 hours) at 05/21/11 1837 Last data filed at 05/21/11 1800  Gross per 24 hour  Intake    415 ml  Output   2210 ml  Net  -1795 ml    Lab Results:   Basename 05/20/11 1840 05/20/11 0854  NA -- 135  K -- 3.1*  CL -- 96  CO2 -- 32  GLUCOSE -- 101*  BUN -- 28*  CREATININE 2.48* 2.72*  CALCIUM -- 9.3    Basename 05/21/11 0532 05/20/11 1840  WBC 6.6 5.5  HGB 13.2 13.4  HCT 37.2* 38.9*  PLT 138* 142*  MCV 94.2 96.3   PT/INR No results found for this basename: LABPROT:2,INR:2 in the last 72 hours ABG No results found for this basename: PHART:2,PCO2:2,PO2:2,HCO3:2 in the last 72 hours  Micro Results: Recent Results (from the past 240 hour(s))  MRSA PCR SCREENING     Status: Normal   Collection Time   05/21/11  4:18 PM      Component Value Range Status Comment   MRSA by PCR NEGATIVE  NEGATIVE  Final     Studies/Results: No results found. Medications:  Scheduled Meds:   . aspirin EC  81 mg Oral Daily  . diltiazem (CARDIZEM) infusion  5 mg/hr Intravenous Once  . folic acid  1 mg Oral Daily  . heparin  5,000 Units Subcutaneous Q8H  . potassium chloride  40 mEq Oral Once  . thiamine  100 mg Oral Daily   Continuous Infusions:   . sodium chloride 50 mL/hr at 05/21/11 1447  . niCARDipine 5 mg/hr (05/21/11 1800)   PRN Meds:.acetaminophen, acetaminophen, bisacodyl, LORazepam, promethazine, promethazine, traZODone Assessment/Plan:  .HTN (hypertension), malignant/hypertensive emergency  - secondary to noncompliance and his polysubstance abuse.  continue nicardipine drip as BP still poorly controlled, follow and transition to POs when better controlled and follow. Continue close monitoring, change to ICU, follow.   .Acute renal failure (ARF)- possibly secondary to poorly controlled hypertension versus pre-renal, follow recheck and further manage as appropriate. Consider renal US in am if not improving on recheck. .Abnormal EKG/elevated Troponins- he is chest pain free,will place on ASA, avoiding b-blockers secondary to cocaine abuse. will obtain 2-d echo and follow, cardiology notified of troponin elevation .Hypokalemia- potassium replaced, follow and recheck. .Polysubstance abuse- once pt medically cleared we'll have social work to assist  with placement at [facility]     LOS: 1 day   Natalie Mceuen C 05/21/2011, 6:37 PM

## 2011-05-21 NOTE — Discharge Planning (Signed)
[place] to speak with Butch Penny Discussed admission for medical clearance for detox but found malignant htn.  Butch Penny spoke with [facility] administration and informed CM  that [facility] would work with pt per [facility] and/or unit CM to contact Jethro Bolus at [phone] on day of d/c so that he can be assisted to get back into [facility] program for detox.  Butch Penny confirmed [facility] is not a medical facility and has limited medical staff (part time RN) CM updated [facility] hospitalist [facility] 5 team CM

## 2011-05-21 NOTE — ED Notes (Signed)
Turned off cardene gtt

## 2011-05-21 NOTE — ED Notes (Signed)
sbp , 160 Cardene drip decreased to 2.5mg /hr

## 2011-05-22 ENCOUNTER — Observation Stay (HOSPITAL_COMMUNITY): Payer: Self-pay

## 2011-05-22 DIAGNOSIS — I517 Cardiomegaly: Secondary | ICD-10-CM

## 2011-05-22 LAB — CBC
HCT: 37.4 % — ABNORMAL LOW (ref 39.0–52.0)
Hemoglobin: 13 g/dL (ref 13.0–17.0)
MCH: 33.2 pg (ref 26.0–34.0)
MCHC: 34.8 g/dL (ref 30.0–36.0)
MCV: 95.4 fL (ref 78.0–100.0)
Platelets: 142 10*3/uL — ABNORMAL LOW (ref 150–400)
RBC: 3.92 MIL/uL — ABNORMAL LOW (ref 4.22–5.81)
RDW: 12.4 % (ref 11.5–15.5)
WBC: 5.7 10*3/uL (ref 4.0–10.5)

## 2011-05-22 LAB — BASIC METABOLIC PANEL
BUN: 19 mg/dL (ref 6–23)
BUN: 15 mg/dL (ref 6–23)
CO2: 34 mEq/L — ABNORMAL HIGH (ref 19–32)
CO2: 30 mEq/L (ref 19–32)
Calcium: 9.2 mg/dL (ref 8.4–10.5)
Calcium: 9.2 mg/dL (ref 8.4–10.5)
Chloride: 99 mEq/L (ref 96–112)
Chloride: 98 mEq/L (ref 96–112)
Creatinine, Ser: 2.16 mg/dL — ABNORMAL HIGH (ref 0.50–1.35)
Creatinine, Ser: 2 mg/dL — ABNORMAL HIGH (ref 0.50–1.35)
GFR calc Af Amer: 38 mL/min — ABNORMAL LOW (ref >90)
GFR calc Af Amer: 42 mL/min — ABNORMAL LOW (ref >90)
GFR calc non Af Amer: 33 mL/min — ABNORMAL LOW (ref >90)
GFR calc non Af Amer: 36 mL/min — ABNORMAL LOW (ref >90)
Glucose, Bld: 90 mg/dL (ref 70–99)
Glucose, Bld: 121 mg/dL — ABNORMAL HIGH (ref 70–99)
Potassium: 2.6 mEq/L — CL (ref 3.5–5.1)
Potassium: 3.5 mEq/L (ref 3.5–5.1)
Sodium: 138 mEq/L (ref 135–145)
Sodium: 135 mEq/L (ref 135–145)

## 2011-05-22 LAB — MAGNESIUM: Magnesium: 1.8 mg/dL (ref 1.5–2.5)

## 2011-05-22 LAB — CREATININE, URINE, RANDOM: Creatinine, Urine: 80.8 mg/dL

## 2011-05-22 LAB — SODIUM, URINE, RANDOM: Sodium, Ur: 56 mEq/L

## 2011-05-22 MED ORDER — POTASSIUM CHLORIDE CRYS ER 20 MEQ PO TBCR
40.0000 meq | EXTENDED_RELEASE_TABLET | Freq: Once | ORAL | Status: AC
  Administered 2011-05-22: 40 meq via ORAL
  Filled 2011-05-22: qty 2

## 2011-05-22 MED ORDER — POTASSIUM CHLORIDE CRYS ER 20 MEQ PO TBCR
40.0000 meq | EXTENDED_RELEASE_TABLET | Freq: Two times a day (BID) | ORAL | Status: DC
  Administered 2011-05-22 – 2011-05-23 (×3): 40 meq via ORAL
  Filled 2011-05-22 (×5): qty 2

## 2011-05-22 MED ORDER — SODIUM CHLORIDE 0.45 % IV SOLN
Freq: Once | INTRAVENOUS | Status: AC
  Administered 2011-05-22: 05:00:00 via INTRAVENOUS
  Filled 2011-05-22: qty 1000

## 2011-05-22 MED ORDER — DILTIAZEM HCL ER COATED BEADS 120 MG PO CP24
120.0000 mg | ORAL_CAPSULE | Freq: Every day | ORAL | Status: DC
  Administered 2011-05-22 – 2011-05-23 (×2): 120 mg via ORAL
  Filled 2011-05-22 (×3): qty 1

## 2011-05-22 MED ORDER — HYDRALAZINE HCL 20 MG/ML IJ SOLN
10.0000 mg | Freq: Once | INTRAMUSCULAR | Status: AC
  Administered 2011-05-22: 10 mg via INTRAVENOUS

## 2011-05-22 MED ORDER — MAGNESIUM SULFATE 40 MG/ML IJ SOLN
2.0000 g | Freq: Once | INTRAMUSCULAR | Status: AC
  Administered 2011-05-22: 2 g via INTRAVENOUS
  Filled 2011-05-22: qty 50

## 2011-05-22 MED ORDER — POTASSIUM CHLORIDE CRYS ER 20 MEQ PO TBCR
40.0000 meq | EXTENDED_RELEASE_TABLET | Freq: Three times a day (TID) | ORAL | Status: DC
  Administered 2011-05-22: 40 meq via ORAL

## 2011-05-22 MED ORDER — HYDRALAZINE HCL 20 MG/ML IJ SOLN
INTRAMUSCULAR | Status: AC
  Filled 2011-05-22: qty 1

## 2011-05-22 MED ORDER — POTASSIUM CHLORIDE CRYS ER 20 MEQ PO TBCR
EXTENDED_RELEASE_TABLET | ORAL | Status: AC
  Filled 2011-05-22: qty 2

## 2011-05-22 MED ORDER — POTASSIUM CHLORIDE CRYS ER 20 MEQ PO TBCR: 40.0000 meq | EXTENDED_RELEASE_TABLET | Freq: Once | ORAL | Status: DC

## 2011-05-22 MED ORDER — POTASSIUM CHLORIDE CRYS ER 20 MEQ PO TBCR: 40.0000 meq | EXTENDED_RELEASE_TABLET | Freq: Three times a day (TID) | ORAL | Status: DC

## 2011-05-22 NOTE — Progress Notes (Signed)
Subjective: Alert & orientedX3, denies CP, headaches, no SOB. Objective: Vital signs in last 24 hours: Temp:  [97.9 F (36.6 C)-98.3 F (36.8 C)] 98 F (36.7 C) (11/21 0800) Pulse Rate:  [56-90] 61  (11/21 0900) Resp:  [14-25] 17  (11/21 0900) BP: (107-170)/(61-105) 144/93 mmHg (11/21 0900) SpO2:  [97 %-100 %] 99 % (11/21 0900) Weight:  [67.9 kg (149 lb 11.1 oz)-69.5 kg (153 lb 3.5 oz)] 153 lb 3.5 oz (69.5 kg) (11/21 0000) Last BM Date: 05/21/11 Intake/Output from previous day: 11/20 0701 - 11/21 0700 In: 2697.5 [P.O.:1200; I.V.:1497.5] Out: 4005 [Urine:4005] Intake/Output this shift:      General Appearance:    Alert, cooperative, no distress, appears stated age  Lungs:     Clear to auscultation bilaterally, respirations unlabored   Heart:    Regular rate and rhythm, S1 and S2 normal, no murmur, rub   or gallop  Abdomen:     Soft, non-tender, bowel sounds active all four quadrants,    no masses, no organomegaly  Extremities:   Extremities normal, atraumatic, no cyanosis or edema       Weight change:   Intake/Output Summary (Last 24 hours) at 05/22/11 1048 Last data filed at 05/22/11 0700  Gross per 24 hour  Intake 2697.5 ml  Output   4005 ml  Net -1307.5 ml    Lab Results:   Basename 05/22/11 0310 05/20/11 1840 05/20/11 0854  NA 138 -- 135  K 2.6* -- 3.1*  CL 99 -- 96  CO2 34* -- 32  GLUCOSE 90 -- 101*  BUN 19 -- 28*  CREATININE 2.16* 2.48* --  CALCIUM 9.2 -- 9.3    Basename 05/22/11 0310 05/21/11 0532  WBC 5.7 6.6  HGB 13.0 13.2  HCT 37.4* 37.2*  PLT 142* 138*  MCV 95.4 94.2   PT/INR No results found for this basename: LABPROT:2,INR:2 in the last 72 hours ABG No results found for this basename: PHART:2,PCO2:2,PO2:2,HCO3:2 in the last 72 hours  Micro Results: Recent Results (from the past 240 hour(s))  MRSA PCR SCREENING     Status: Normal   Collection Time   05/21/11  4:18 PM      Component Value Range Status Comment   MRSA by PCR NEGATIVE   NEGATIVE  Final    Studies/Results: No results found. Medications:  Scheduled Meds:    . aspirin EC  81 mg Oral Daily  . diltiazem  120 mg Oral Daily  . folic acid  1 mg Oral Daily  . heparin  5,000 Units Subcutaneous Q8H  . magnesium sulfate IVPB  2 g Intravenous Once  . oxyCODONE  5 mg Oral Once  . potassium chloride  40 mEq Oral BID  . sodium chloride 0.45 % with kcl   Intravenous Once  . thiamine  100 mg Oral Daily  . DISCONTD: potassium chloride SA      . DISCONTD: potassium chloride  40 mEq Oral TID  . DISCONTD: potassium chloride  40 mEq Oral TID  . DISCONTD: potassium chloride  40 mEq Oral Once   Continuous Infusions:    . DISCONTD: sodium chloride 50 mL/hr at 05/21/11 1447  . DISCONTD: niCARDipine 1 mg/hr (05/22/11 0700)   PRN Meds:.acetaminophen, acetaminophen, bisacodyl, lidocaine, LORazepam, promethazine, promethazine, traZODone Assessment/Plan:  .HTN (hypertension), malignant/hypertensive emergency  - secondary to noncompliance and his polysubstance abuse.  BP improved.  nicardipine drip discontinued and patient currently on oral Cardizem per cardiology. Continue close monitoring, change to SDU, follow.   .Acute  renal failure (ARF)- possibly secondary to poorly controlled hypertension versus pre-renal, follow recheck and further manage as appropriate. Good urine output. Improving slowly. Check a FENA. Check renal US. Marland KitchenAbnormal EKG/elevated Troponins- he is chest pain free,will place on ASA, avoiding b-blockers secondary to cocaine abuse. will obtain 2-d echo pending, cardiology notified of troponin elevation and following and feel abn EKG secondary to LVH. Marland KitchenHypokalemia- potassium replace, check Magnesium level. .Polysubstance abuse- once pt medically cleared we'll have social work to assist with placement at [facility]     LOS: 2 days   [facility] 05/22/2011, 10:48 AM

## 2011-05-22 NOTE — Progress Notes (Signed)
UR CHART REVIEWED 

## 2011-05-22 NOTE — Progress Notes (Addendum)
CRITICAL VALUE ALERT  Critical value received:  Potassium 2.6  Date of notification: 05/22/11  Time of notification:  4:05  Critical value read back:yes  Nurse who received alert:  Benjamin Stain  MD notified (1st page):  Joie Bimler  Time of first page:  4:12  MD notified (2nd page):  Time of second page:  Responding MD:  Joie Bimler  Time MD responded:  4:23

## 2011-05-22 NOTE — Progress Notes (Signed)
Patient ID: Corey Guerrero, male   DOB: 1955/10/16, 55 y.o.   MRN: [id number] @ Subjective:  Denies SSCP, palpitations or Dyspnea Wants to go to rehab at [facility]  Objective:  Vital Signs in the last 24 hours:       Wt Readings from Last 1 Encounters:  05/22/11 69.5 kg (153 lb 3.5 oz)    Temp:  [97.9 F (36.6 C)-98.7 F (37.1 C)] 97.9 F (36.6 C) (11/21 0400) Pulse Rate:  [56-90] 65  (11/21 0645) Resp:  [14-25] 19  (11/21 0645) BP: (107-178)/(61-105) 158/94 mmHg (11/21 0746) SpO2:  [97 %-100 %] 98 % (11/21 0645) Weight:  [67.9 kg (149 lb 11.1 oz)-69.5 kg (153 lb 3.5 oz)] 153 lb 3.5 oz (69.5 kg) (11/21 0000)  Intake/Output from previous day: 11/20 0701 - 11/21 0700 In: 2697.5 [P.O.:1200; I.V.:1497.5] Out: 4005 [Urine:4005] Intake/Output from this shift:    Physical Exam: General appearance: alert and no distress Lungs: clear to auscultation bilaterally Heart: regular rate and rhythm, S1, S2 normal, no murmur, click, rub or gallop Abdomen: soft, non-tender; bowel sounds normal; no masses,  no organomegaly Extremities: extremities normal, atraumatic, no cyanosis or edema Pulses: 2+ and symmetric  Lab Results:  Basename 05/22/11 0310 05/21/11 0532  WBC 5.7 6.6  HGB 13.0 13.2  PLT 142* 138*    Basename 05/22/11 0310 05/20/11 1840 05/20/11 0854  NA 138 -- 135  K 2.6* -- 3.1*  CL 99 -- 96  CO2 34* -- 32  GLUCOSE 90 -- 101*  BUN 19 -- 28*  CREATININE 2.16* 2.48* --    Basename 05/21/11 1804 05/21/11 1100  TROPONINI 0.76* 0.72*   Hepatic Function Panel  Basename 05/20/11 0854  PROT 7.3  ALBUMIN 3.5  AST 47*  ALT 30  ALKPHOS 54  BILITOT 0.5  BILIDIR --  IBILI --   No results found for this basename: CHOL in the last 72 hours No results found for this basename: PROTIME in the last 72 hours  Imaging: No results found.  Cardiac Studies: ECG:  SR LVH with strain Echo:  01/25/10 normal with moderate LVH and EF 55%  Medications:     . aspirin EC  81 mg  Oral Daily  . folic acid  1 mg Oral Daily  . heparin  5,000 Units Subcutaneous Q8H  . oxyCODONE  5 mg Oral Once  . potassium chloride  40 mEq Oral TID  . sodium chloride 0.45 % with kcl   Intravenous Once  . thiamine  100 mg Oral Daily  . DISCONTD: potassium chloride SA      . DISCONTD: potassium chloride  40 mEq Oral TID    Assessment/Plan:  Abnromal ECG:  Represents LVH.  Confirmed on echo 7/11  F/U echo today HTN:  Off nicardipine drip start oral calcium blocker CRF:  Should be seen by nephrology in house to establish long term care as this will be difficult to arrange as outpatient KCL:  Supplement  Probably ok to go to rehab latter today or tomorrow Will sign off if echo ok  Jenkins Rouge 05/22/2011, 7:56 AM

## 2011-05-23 LAB — BASIC METABOLIC PANEL
BUN: 16 mg/dL (ref 6–23)
CO2: 30 mEq/L (ref 19–32)
Calcium: 9.1 mg/dL (ref 8.4–10.5)
Chloride: 101 mEq/L (ref 96–112)
Creatinine, Ser: 2 mg/dL — ABNORMAL HIGH (ref 0.50–1.35)
GFR calc Af Amer: 42 mL/min — ABNORMAL LOW (ref >90)
GFR calc non Af Amer: 36 mL/min — ABNORMAL LOW (ref >90)
Glucose, Bld: 92 mg/dL (ref 70–99)
Potassium: 3.8 mEq/L (ref 3.5–5.1)
Sodium: 136 mEq/L (ref 135–145)

## 2011-05-23 LAB — MAGNESIUM: Magnesium: 2.2 mg/dL (ref 1.5–2.5)

## 2011-05-23 MED ORDER — ZOLPIDEM TARTRATE 5 MG PO TABS: 5.0000 mg | ORAL_TABLET | Freq: Every evening | ORAL | Status: DC | PRN

## 2011-05-23 MED ORDER — LABETALOL HCL 200 MG PO TABS
200.0000 mg | ORAL_TABLET | Freq: Two times a day (BID) | ORAL | Status: DC
  Administered 2011-05-23 – 2011-05-25 (×5): 200 mg via ORAL
  Filled 2011-05-23 (×6): qty 1

## 2011-05-23 MED ORDER — HYDRALAZINE HCL 20 MG/ML IJ SOLN
10.0000 mg | Freq: Four times a day (QID) | INTRAMUSCULAR | Status: DC | PRN
  Administered 2011-05-23: 10 mg via INTRAVENOUS
  Filled 2011-05-23 (×2): qty 1

## 2011-05-23 NOTE — Progress Notes (Signed)
Subjective: Alert & orientedX3, denies CP, headaches, no SOB. Patient states did not tolerate trazodone last night. Objective: Vital signs in last 24 hours: Temp:  [97 F (36.1 C)-98.5 F (36.9 C)] 98.3 F (36.8 C) (11/22 0800) Pulse Rate:  [55-69] 55  (11/22 0354) Resp:  [15-25] 15  (11/22 0354) BP: (132-187)/(88-105) 187/105 mmHg (11/22 0914) SpO2:  [99 %-100 %] 99 % (11/22 0354) Weight:  [70.3 kg (154 lb 15.7 oz)] 154 lb 15.7 oz (70.3 kg) (11/22 0000) Last BM Date: 05/22/11 Intake/Output from previous day: 11/21 0701 - 11/22 0700 In: 2535 [P.O.:1680; I.V.:630] Out: 3125 [Urine:3125] Intake/Output this shift: Total I/O In: -  Out: 500 [Urine:500]    General Appearance:    Alert, cooperative, no distress, appears stated age  Lungs:     Clear to auscultation bilaterally, respirations unlabored   Heart:    Regular rate and rhythm, S1 and S2 normal, no murmur, rub   or gallop  Abdomen:     Soft, non-tender, bowel sounds active all four quadrants,    no masses, no organomegaly  Extremities:   Extremities normal, atraumatic, no cyanosis or edema       Weight change: 2.4 kg (5 lb 4.7 oz)  Intake/Output Summary (Last 24 hours) at 05/23/11 1021 Last data filed at 05/23/11 0800  Gross per 24 hour  Intake   2040 ml  Output   3275 ml  Net  -1235 ml    Lab Results:   [facility] 05/23/11 0317 05/22/11 1556  NA 136 135  K 3.8 3.5  CL 101 98  CO2 30 30  GLUCOSE 92 121*  BUN 16 15  CREATININE 2.00* 2.00*  CALCIUM 9.1 9.2    Basename 05/22/11 0310 05/21/11 0532  WBC 5.7 6.6  HGB 13.0 13.2  HCT 37.4* 37.2*  PLT 142* 138*  MCV 95.4 94.2   PT/INR No results found for this basename: LABPROT:2,INR:2 in the last 72 hours ABG No results found for this basename: PHART:2,PCO2:2,PO2:2,HCO3:2 in the last 72 hours  Micro Results: Recent Results (from the past 240 hour(s))  MRSA PCR SCREENING     Status: Normal   Collection Time   05/21/11  4:18 PM      Component Value  Range Status Comment   MRSA by PCR NEGATIVE  NEGATIVE  Final    Studies/Results: US Renal  05/22/2011  *RADIOLOGY REPORT*  Clinical Data: Acute renal failure.  RENAL/URINARY TRACT ULTRASOUND COMPLETE  Comparison:  CT scan of the abdomen dated and 01/25/2010  Findings:  Right Kidney:  9.9 cm in length.  Diffuse increased echogenicity of the renal parenchyma consistent with renal medical disease.  No obstruction.  Left Kidney:  9.6 cm in length.  Diffuse increased echogenicity of the renal parenchyma consistent with renal medical disease.  No hydronephrosis.  Bladder:  Normal.  IMPRESSION:  1.  Increased echogenicity of the renal parenchyma bilaterally consistent with renal medical disease. 2.  No hydronephrosis or mass lesions.  Original Report Authenticated By: Larey Seat, M.D.   Medications:  Scheduled Meds:    . aspirin EC  81 mg Oral Daily  . diltiazem  120 mg Oral Daily  . folic acid  1 mg Oral Daily  . heparin  5,000 Units Subcutaneous Q8H  . hydrALAZINE  10 mg Intravenous Once  . magnesium sulfate IVPB  2 g Intravenous Once  . potassium chloride  40 mEq Oral BID  . potassium chloride  40 mEq Oral Once  . potassium chloride  40 mEq Oral Once  . thiamine  100 mg Oral Daily  . DISCONTD: potassium chloride  40 mEq Oral Once   Continuous Infusions:   PRN Meds:.acetaminophen, acetaminophen, bisacodyl, hydrALAZINE, lidocaine, LORazepam, promethazine, promethazine, traZODone Assessment/Plan:  .HTN (hypertension), malignant/hypertensive emergency  - secondary to noncompliance and his polysubstance abuse.  BP improved.  nicardipine drip discontinued and patient currently on oral Cardizem per cardiology. SBP in 180s this morning. Will add labetalol 200mg  BID to regimen and follow. Continue close monitoring.  .Acute renal failure (ARF)- possibly secondary to poorly controlled hypertension versus pre-renal, follow recheck and further manage as appropriate. Good urine output. Improving  slowly. FENA = 1.08. Renal US with increased parencymal echogenicity consistent with medical renal dz. Last Cr 1.4 01/2010. Will need outpatient follow up. .Abnormal EKG/elevated Troponins- he is chest pain free,will place on ASA, avoiding b-blockers secondary to cocaine abuse.  2-d echo with EF 55-60% WITH nwma., cardiology  feel abn EKG AND TROPONINS  secondary to LVH. Patient asymptomatic. Continue BP control. .Hypokalemia- repleted. .Polysubstance abuse- once pt medically cleared we'll have social work to assist with placement at [facility] .    LOS: 3 days   [facility] 05/23/2011, 10:21 AM

## 2011-05-23 NOTE — Progress Notes (Signed)
Received Trazadone 50mg  at 2231 for sleep. About 40 minutes later he began feeling Short of breath,and than felt panic.Oxygen sats 100, heart rate 70, B/P 158/93. No wheezes heard,no rash or erythema noted.   No work of breathing noted.Stayed with him providing emotional support and reassurance, and encouraged him to relax. Gave him 2L of oxygen via nasal cannula. He began to feel better, and than fell asleep. We listed Trazadone as an intolerance, and informed him not to take it in the future. Requested MD on call to view note, and make any further recommendations.

## 2011-05-23 NOTE — Progress Notes (Signed)
Patient ID: Corey Guerrero, male   DOB: 10-21-1955, 55 y.o.   MRN: [id number] @ Subjective:  Denies SSCP, palpitations or Dyspnea Wants to go to rehab at [facility]  Objective:  Vital Signs in the last 24 hours:       Wt Readings from Last 1 Encounters:  05/23/11 70.3 kg (154 lb 15.7 oz)    Temp:  [97 F (36.1 C)-98.5 F (36.9 C)] 98.3 F (36.8 C) (11/22 0800) Pulse Rate:  [55-69] 55  (11/22 0354) Resp:  [15-25] 15  (11/22 0354) BP: (132-187)/(88-105) 187/105 mmHg (11/22 0914) SpO2:  [99 %-100 %] 99 % (11/22 0354) Weight:  [70.3 kg (154 lb 15.7 oz)] 154 lb 15.7 oz (70.3 kg) (11/22 0000)  Intake/Output from previous day: 11/21 0701 - 11/22 0700 In: 2535 [P.O.:1680; I.V.:630] Out: 3125 [Urine:3125] Intake/Output from this shift: Total I/O In: -  Out: 500 [Urine:500]  Physical Exam: General appearance: alert and no distress Lungs: clear to auscultation bilaterally Heart: regular rate and rhythm, S1, S2 normal, no murmur, click, rub or gallop Abdomen: soft, non-tender; bowel sounds normal; no masses,  no organomegaly Extremities: extremities normal, atraumatic, no cyanosis or edema Pulses: 2+ and symmetric  Lab Results:  Basename 05/22/11 0310 05/21/11 0532  WBC 5.7 6.6  HGB 13.0 13.2  PLT 142* 138*    Basename 05/23/11 0317 05/22/11 1556  NA 136 135  K 3.8 3.5  CL 101 98  CO2 30 30  GLUCOSE 92 121*  BUN 16 15  CREATININE 2.00* 2.00*    Basename 05/21/11 1804 05/21/11 1100  TROPONINI 0.76* 0.72*   Hepatic Function Panel No results found for this basename: PROT,ALBUMIN,AST,ALT,ALKPHOS,BILITOT,BILIDIR,IBILI in the last 72 hours No results found for this basename: CHOL in the last 72 hours No results found for this basename: PROTIME in the last 72 hours  Imaging: US Renal  05/22/2011  *RADIOLOGY REPORT*  Clinical Data: Acute renal failure.  RENAL/URINARY TRACT ULTRASOUND COMPLETE  Comparison:  CT scan of the abdomen dated and 01/25/2010  Findings:  Right Kidney:   9.9 cm in length.  Diffuse increased echogenicity of the renal parenchyma consistent with renal medical disease.  No obstruction.  Left Kidney:  9.6 cm in length.  Diffuse increased echogenicity of the renal parenchyma consistent with renal medical disease.  No hydronephrosis.  Bladder:  Normal.  IMPRESSION:  1.  Increased echogenicity of the renal parenchyma bilaterally consistent with renal medical disease. 2.  No hydronephrosis or mass lesions.  Original Report Authenticated By: Larey Seat, M.D.    Cardiac Studies: ECG:  SR LVH with strain Echo:  01/25/10 normal with moderate LVH and EF 55%  Medications:      . aspirin EC  81 mg Oral Daily  . diltiazem  120 mg Oral Daily  . folic acid  1 mg Oral Daily  . heparin  5,000 Units Subcutaneous Q8H  . hydrALAZINE  10 mg Intravenous Once  . magnesium sulfate IVPB  2 g Intravenous Once  . potassium chloride  40 mEq Oral BID  . potassium chloride  40 mEq Oral Once  . potassium chloride  40 mEq Oral Once  . thiamine  100 mg Oral Daily  . DISCONTD: potassium chloride  40 mEq Oral Once    Assessment/Plan:  Abnromal ECG:  Represents LVH.  Confirmed on echo 7/11  F/U yesterday reviewed and confirms moderat LVH no other abnormalities HTN:  Continue calcium blocker and consider labatolol  Plan per primary service  BP not  in dangerous range CRF:  Should be seen by nephrology in house to establish long term care as this will be difficult to arrange as outpatient  Probably ok to go to rehab latter today or tomorrow Will sign off   Jenkins Rouge 05/23/2011, 10:11 AM

## 2011-05-24 DIAGNOSIS — N179 Acute kidney failure, unspecified: Secondary | ICD-10-CM | POA: Diagnosis present

## 2011-05-24 LAB — BASIC METABOLIC PANEL
BUN: 16 mg/dL (ref 6–23)
CO2: 28 mEq/L (ref 19–32)
Calcium: 9.2 mg/dL (ref 8.4–10.5)
Chloride: 103 mEq/L (ref 96–112)
Creatinine, Ser: 2.31 mg/dL — ABNORMAL HIGH (ref 0.50–1.35)
GFR calc Af Amer: 35 mL/min — ABNORMAL LOW (ref >90)
GFR calc non Af Amer: 30 mL/min — ABNORMAL LOW (ref >90)
Glucose, Bld: 97 mg/dL (ref 70–99)
Potassium: 4 mEq/L (ref 3.5–5.1)
Sodium: 137 mEq/L (ref 135–145)

## 2011-05-24 MED ORDER — AMLODIPINE BESYLATE 10 MG PO TABS
10.0000 mg | ORAL_TABLET | Freq: Every day | ORAL | Status: DC
  Administered 2011-05-24 – 2011-05-28 (×5): 10 mg via ORAL
  Filled 2011-05-24 (×5): qty 1

## 2011-05-24 NOTE — Progress Notes (Signed)
Subjective: Alert & orientedX3. No complaints. Objective: Vital signs in last 24 hours: Temp:  [97.2 F (36.2 C)-98.5 F (36.9 C)] 97.8 F (36.6 C) (11/23 1428) Pulse Rate:  [59-71] 61  (11/23 1428) Resp:  [18-22] 18  (11/23 1428) BP: (135-152)/(80-96) 147/80 mmHg (11/23 1428) SpO2:  [97 %-100 %] 98 % (11/23 1428) Weight:  [69.5 kg (153 lb 3.5 oz)] 153 lb 3.5 oz (69.5 kg) (11/22 2133) Last BM Date: 05/24/11 Intake/Output from previous day: 11/22 0701 - 11/23 0700 In: 240 [P.O.:240] Out: 500 [Urine:500] Intake/Output this shift: Total I/O In: 120 [P.O.:120] Out: -     General Appearance:    Alert, cooperative, no distress, appears stated age  Lungs:     Clear to auscultation bilaterally, respirations unlabored   Heart:    Regular rate and rhythm, S1 and S2 normal, no murmur, rub   or gallop  Abdomen:     Soft, non-tender, bowel sounds active all four quadrants,    no masses, no organomegaly  Extremities:   Extremities normal, atraumatic, no cyanosis or edema       Weight change: -0.8 kg (-1 lb 12.2 oz)  Intake/Output Summary (Last 24 hours) at 05/24/11 1851 Last data filed at 05/24/11 0900  Gross per 24 hour  Intake    360 ml  Output      0 ml  Net    360 ml    Lab Results:   Basename 05/24/11 0513 05/23/11 0317  NA 137 136  K 4.0 3.8  CL 103 101  CO2 28 30  GLUCOSE 97 92  BUN 16 16  CREATININE 2.31* 2.00*  CALCIUM 9.2 9.1    Basename 05/22/11 0310  WBC 5.7  HGB 13.0  HCT 37.4*  PLT 142*  MCV 95.4   PT/INR No results found for this basename: LABPROT:2,INR:2 in the last 72 hours ABG No results found for this basename: PHART:2,PCO2:2,PO2:2,HCO3:2 in the last 72 hours  Micro Results: Recent Results (from the past 240 hour(s))  MRSA PCR SCREENING     Status: Normal   Collection Time   05/21/11  4:18 PM      Component Value Range Status Comment   MRSA by PCR NEGATIVE  NEGATIVE  Final    Studies/Results: No results found. Medications:    Scheduled Meds:    . amLODipine  10 mg Oral Daily  . aspirin EC  81 mg Oral Daily  . folic acid  1 mg Oral Daily  . heparin  5,000 Units Subcutaneous Q8H  . labetalol  200 mg Oral BID  . thiamine  100 mg Oral Daily  . DISCONTD: diltiazem  120 mg Oral Daily  . DISCONTD: potassium chloride  40 mEq Oral BID   Continuous Infusions:   PRN Meds:.acetaminophen, acetaminophen, bisacodyl, hydrALAZINE, lidocaine, LORazepam, promethazine, promethazine, zolpidem Assessment/Plan:  .HTN (hypertension), malignant/hypertensive emergency  - secondary to noncompliance and his polysubstance abuse.  BP improved.  nicardipine drip discontinued and patient currently on oral Cardizem per cardiology. Cardizem changed to norvasc. Continue labetalol.Hydralazine PRN.  Marland KitchenAcute renal failure (ARF) on CKD- possibly secondary to poorly controlled hypertension versus pre-renal. Renal function worse from yesterday. Good urine output. Improving slowly. FENA = 1.08. Renal US with increased parencymal echogenicity consistent with medical renal dz. Last Cr 1.4 01/2010. Renal consulting and appreciate their input and rxcs. .Abnormal EKG/elevated Troponins- he is chest pain free,will place on ASA, avoiding b-blockers secondary to cocaine abuse.  2-d echo with EF 55-60% WITH nwma., cardiology  feel  abn EKG AND TROPONINS  secondary to LVH. Patient asymptomatic. Continue BP control. .Hypokalemia- repleted. .Polysubstance abuse- once pt medically cleared we'll have social work to assist with placement at [facility] .    LOS: 4 days   [facility] 05/24/2011, 6:51 PM
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
## 2011-05-25 LAB — RENAL FUNCTION PANEL
Albumin: 3.3 g/dL — ABNORMAL LOW (ref 3.5–5.2)
BUN: 18 mg/dL (ref 6–23)
CO2: 25 mEq/L (ref 19–32)
Calcium: 9.4 mg/dL (ref 8.4–10.5)
Chloride: 102 mEq/L (ref 96–112)
Creatinine, Ser: 2.23 mg/dL — ABNORMAL HIGH (ref 0.50–1.35)
GFR calc Af Amer: 37 mL/min — ABNORMAL LOW (ref >90)
GFR calc non Af Amer: 32 mL/min — ABNORMAL LOW (ref >90)
Glucose, Bld: 89 mg/dL (ref 70–99)
Phosphorus: 3.3 mg/dL (ref 2.3–4.6)
Potassium: 4.3 mEq/L (ref 3.5–5.1)
Sodium: 136 mEq/L (ref 135–145)

## 2011-05-25 LAB — CBC
HCT: 37.3 % — ABNORMAL LOW (ref 39.0–52.0)
Hemoglobin: 12.6 g/dL — ABNORMAL LOW (ref 13.0–17.0)
MCH: 33 pg (ref 26.0–34.0)
MCHC: 33.8 g/dL (ref 30.0–36.0)
MCV: 97.6 fL (ref 78.0–100.0)
Platelets: 149 10*3/uL — ABNORMAL LOW (ref 150–400)
RBC: 3.82 MIL/uL — ABNORMAL LOW (ref 4.22–5.81)
RDW: 12.8 % (ref 11.5–15.5)
WBC: 5 10*3/uL (ref 4.0–10.5)

## 2011-05-25 MED ORDER — FUROSEMIDE 80 MG PO TABS
80.0000 mg | ORAL_TABLET | Freq: Two times a day (BID) | ORAL | Status: DC
  Administered 2011-05-25 – 2011-05-27 (×4): 80 mg via ORAL
  Filled 2011-05-25 (×5): qty 1

## 2011-05-25 MED ORDER — LABETALOL HCL 200 MG PO TABS
400.0000 mg | ORAL_TABLET | Freq: Two times a day (BID) | ORAL | Status: DC
  Administered 2011-05-25 – 2011-05-28 (×6): 400 mg via ORAL
  Filled 2011-05-25 (×7): qty 2

## 2011-05-25 MED ORDER — HYDRALAZINE HCL 20 MG/ML IJ SOLN
10.0000 mg | Freq: Once | INTRAMUSCULAR | Status: AC
  Administered 2011-05-25: 10 mg via INTRAVENOUS

## 2011-05-25 NOTE — Progress Notes (Signed)
Subjective: No complaints. Patient motivated for rehab at [facility]. Objective: Vital signs in last 24 hours: Temp:  [97.5 F (36.4 C)-98.1 F (36.7 C)] 97.5 F (36.4 C) (11/24 1426) Pulse Rate:  [64-68] 68  (11/24 1426) Resp:  [20] 20  (11/24 1426) BP: (126-178)/(87-121) 126/87 mmHg (11/24 1426) SpO2:  [98 %-99 %] 99 % (11/24 1426) Last BM Date: 05/25/11 Intake/Output from previous day: 11/23 0701 - 11/24 0700 In: 120 [P.O.:120] Out: -  Intake/Output this shift: Total I/O In: 240 [P.O.:240] Out: -     General Appearance:    Alert, cooperative, no distress, appears stated age.  Lungs:     Clear to auscultation bilaterally, respirations unlabored.   Heart:    Regular rate and rhythm, S1 and S2 normal, no murmur, rub   or gallop  Abdomen:     Soft, non-tender, bowel sounds active all four quadrants,    no masses, no organomegaly  Extremities:   Extremities normal, atraumatic, no cyanosis or edema       Weight change:   Intake/Output Summary (Last 24 hours) at 05/25/11 1808 Last data filed at 05/25/11 1700  Gross per 24 hour  Intake    240 ml  Output      0 ml  Net    240 ml    Lab Results:   Basename 05/25/11 0535 05/24/11 0513  NA 136 137  K 4.3 4.0  CL 102 103  CO2 25 28  GLUCOSE 89 97  BUN 18 16  CREATININE 2.23* 2.31*  CALCIUM 9.4 9.2    Basename 05/25/11 0535  WBC 5.0  HGB 12.6*  HCT 37.3*  PLT 149*  MCV 97.6   PT/INR No results found for this basename: LABPROT:2,INR:2 in the last 72 hours ABG No results found for this basename: PHART:2,PCO2:2,PO2:2,HCO3:2 in the last 72 hours  Micro Results: Recent Results (from the past 240 hour(s))  MRSA PCR SCREENING     Status: Normal   Collection Time   05/21/11  4:18 PM      Component Value Range Status Comment   MRSA by PCR NEGATIVE  NEGATIVE  Final    Studies/Results: No results found. Medications:  Scheduled Meds:    . amLODipine  10 mg Oral Daily  . aspirin EC  81 mg Oral Daily  . folic  acid  1 mg Oral Daily  . furosemide  80 mg Oral BID  . heparin  5,000 Units Subcutaneous Q8H  . hydrALAZINE  10 mg Intravenous Once  . labetalol  400 mg Oral BID  . thiamine  100 mg Oral Daily  . DISCONTD: labetalol  200 mg Oral BID   Continuous Infusions:   PRN Meds:.acetaminophen, acetaminophen, bisacodyl, hydrALAZINE, lidocaine, LORazepam, promethazine, promethazine, zolpidem Assessment/Plan:  .HTN (hypertension), malignant/hypertensive emergency  - secondary to noncompliance and his polysubstance abuse.Continue norvasc. Labetalolol dose increased. Patient started on diuretic per renal. Hydralazine prn.  .Acute renal failure (ARF) on CKD- possibly secondary to poorly controlled hypertension and cocaine abuse. Renal function fluctuating. Good urine output. Renal US with increased parencymal echogenicity consistent with medical renal dz. Last Cr 1.4 01/2010. Renal consulting and appreciate their input and rxcs. .Abnormal EKG/elevated Troponins- he is chest pain free. avoiding b-blockers secondary to cocaine abuse.  2-d echo with EF 55-60% WITH nwma., cardiology  feel abn EKG AND TROPONINS  secondary to LVH. Patient asymptomatic. Continue BP control. .Hypokalemia- repleted. .Polysubstance abuse- once pt medically cleared we'll have social work to assist with placement at [facility] .  LOS: 5 days   Ossiel Marchio 05/25/2011, 6:08 PM

## 2011-05-25 NOTE — Progress Notes (Signed)
Subjective: Interval History: none.  Objective: Vital signs in last 24 hours: Temp:  [97.8 F (36.6 C)-98.1 F (36.7 C)] 98.1 F (36.7 C) (11/24 0634) Pulse Rate:  [61-68] 68  (11/24 0634) Resp:  [18-20] 20  (11/24 0634) BP: (147-178)/(80-121) 171/121 mmHg (11/24 0707) SpO2:  [98 %] 98 % (11/24 0634) Weight change:   Intake/Output from previous day: 11/23 0701 - 11/24 0700 In: 120 [P.O.:120] Out: -  Intake/Output this shift:    General appearance: alert, cooperative and anxious,pressured speech Resp: clear to auscultation bilaterally Cardio: regular rate and rhythm, S1, S2 normal, systolic murmur: holosystolic 2/6, blowing at apex and LV lift GI: Liver down 4 cm Extremities: no edema, redness or tenderness in the calves or thighs  Lab Results:  [facility] 05/25/11 0535  WBC 5.0  HGB 12.6*  HCT 37.3*  PLT 149*   BMET:  Basename 05/25/11 0535 05/24/11 0513  NA 136 137  K 4.3 4.0  CL 102 103  CO2 25 28  GLUCOSE 89 97  BUN 18 16  CREATININE 2.23* 2.31*  CALCIUM 9.4 9.2   No results found for this basename: PTH:2 in the last 72 hours Iron Studies: No results found for this basename: IRON,TIBC,TRANSFERRIN,FERRITIN in the last 72 hours  Studies/Results: No results found.  I have reviewed the patient's current medications. And modified  Assessment/Plan: 1CkD 3, secondary to HTN and cocaine 2 HTN will ^ meds, add diuretic 3 Substance abuse needs rehab, ? Using or withdrawal  P as above    LOS: 5 days   Maddock Finigan L 05/25/2011,1:36 PM

## 2011-05-26 LAB — RENAL FUNCTION PANEL
Albumin: 3.4 g/dL — ABNORMAL LOW (ref 3.5–5.2)
BUN: 17 mg/dL (ref 6–23)
CO2: 29 mEq/L (ref 19–32)
Calcium: 9.6 mg/dL (ref 8.4–10.5)
Chloride: 99 mEq/L (ref 96–112)
Creatinine, Ser: 2.49 mg/dL — ABNORMAL HIGH (ref 0.50–1.35)
GFR calc Af Amer: 32 mL/min — ABNORMAL LOW (ref >90)
GFR calc non Af Amer: 28 mL/min — ABNORMAL LOW (ref >90)
Glucose, Bld: 88 mg/dL (ref 70–99)
Phosphorus: 4.3 mg/dL (ref 2.3–4.6)
Potassium: 3.6 mEq/L (ref 3.5–5.1)
Sodium: 136 mEq/L (ref 135–145)

## 2011-05-26 MED ORDER — POTASSIUM CHLORIDE CRYS ER 20 MEQ PO TBCR
40.0000 meq | EXTENDED_RELEASE_TABLET | Freq: Every day | ORAL | Status: DC
  Administered 2011-05-26 – 2011-05-28 (×3): 40 meq via ORAL
  Filled 2011-05-26 (×3): qty 2

## 2011-05-26 NOTE — Progress Notes (Signed)
Subjective: Interval History: none.  Objective: Vital signs in last 24 hours:  Temp:  [97.5 F (36.4 C)-98.2 F (36.8 C)] 98.2 F (36.8 C) (11/25 0600) Pulse Rate:  [60-68] 60  (11/25 0600) Resp:  [18-20] 18  (11/25 0600) BP: (126-155)/(87-96) 136/87 mmHg (11/25 0600) SpO2:  [99 %-100 %] 100 % (11/25 0600)  Weight change:   Intake/Output: I/O last 3 completed shifts: In: 240 [P.O.:240] Out: -    Intake/Output this shift:     General appearance: alert, cooperative and anxious,pressured speech  Resp: clear to auscultation bilaterally  Cardio: regular rate and rhythm, S1, S2 normal, systolic murmur: holosystolic 2/6, blowing at apex and LV lift  GI: Liver down 4 cm  Extremities: no edema, redness or tenderness in the calves or thighs   Lab Results:  [facility] 05/25/11 0535  WBC 5.0  HGB 12.6*  HCT 37.3*  PLT 149*   BMET  Basename 05/26/11 0605 05/25/11 0535 05/24/11 0513  NA 136 136 137  K 3.6 4.3 4.0  CL 99 102 103  CO2 29 25 28   GLUCOSE 88 89 97  BUN 17 18 16   CREATININE 2.49* 2.23* 2.31*  CALCIUM 9.6 9.4 9.2  PHOS 4.3 3.3 --   LFT  Basename 05/26/11 0605  PROT --  ALBUMIN 3.4*  AST --  ALT --  ALKPHOS --  BILITOT --  BILIDIR --  IBILI --   PT/INR No results found for this basename: LABPROT:2,INR:2 in the last 72 hours Hepatitis Panel No results found for this basename: HEPBSAG,HCVAB,HEPAIGM,HEPBIGM in the last 72 hours  Studies/Results: No results found.  I have reviewed the patient's current medications.  Assessment/Plan: 1CkD 3, secondary to HTN and cocaine  2 HTN will ^ meds, add diuretic  3 Substance abuse needs rehab, ? Using or withdrawal 4.Follow up with Dr Marval Regal as an outpatient     LOS: 6 Corey Guerrero @TODAY @9 :26 AM

## 2011-05-26 NOTE — Progress Notes (Signed)
Subjective: No complaints. Patient motivated for rehab at [facility]. Objective: Vital signs in last 24 hours: Temp:  [97.5 F (36.4 C)-98.2 F (36.8 C)] 98.2 F (36.8 C) (11/25 0600) Pulse Rate:  [60-68] 60  (11/25 0600) Resp:  [18-20] 18  (11/25 0600) BP: (126-155)/(87-96) 136/87 mmHg (11/25 0600) SpO2:  [99 %-100 %] 100 % (11/25 0600) Last BM Date: 05/26/11 Intake/Output from previous day: 11/24 0701 - 11/25 0700 In: 240 [P.O.:240] Out: -  Intake/Output this shift:      General Appearance:    Alert, cooperative, no distress, appears stated age.  Lungs:     Clear to auscultation bilaterally, respirations unlabored.   Heart:    Regular rate and rhythm, S1 and S2 normal, no murmur, rub   or gallop  Abdomen:     Soft, non-tender, bowel sounds active all four quadrants,    no masses, no organomegaly  Extremities:   Extremities normal, atraumatic, no cyanosis or edema       Weight change:   Intake/Output Summary (Last 24 hours) at 05/26/11 0959 Last data filed at 05/25/11 1700  Gross per 24 hour  Intake    240 ml  Output      0 ml  Net    240 ml    Lab Results:   [facility] 05/26/11 0605 05/25/11 0535  NA 136 136  K 3.6 4.3  CL 99 102  CO2 29 25  GLUCOSE 88 89  BUN 17 18  CREATININE 2.49* 2.23*  CALCIUM 9.6 9.4    Basename 05/25/11 0535  WBC 5.0  HGB 12.6*  HCT 37.3*  PLT 149*  MCV 97.6   PT/INR No results found for this basename: LABPROT:2,INR:2 in the last 72 hours ABG No results found for this basename: PHART:2,PCO2:2,PO2:2,HCO3:2 in the last 72 hours  Micro Results: Recent Results (from the past 240 hour(s))  MRSA PCR SCREENING     Status: Normal   Collection Time   05/21/11  4:18 PM      Component Value Range Status Comment   MRSA by PCR NEGATIVE  NEGATIVE  Final    Studies/Results: No results found. Medications:  Scheduled Meds:    . amLODipine  10 mg Oral Daily  . aspirin EC  81 mg Oral Daily  . folic acid  1 mg Oral Daily  .  furosemide  80 mg Oral BID  . heparin  5,000 Units Subcutaneous Q8H  . labetalol  400 mg Oral BID  . potassium chloride  40 mEq Oral Daily  . thiamine  100 mg Oral Daily  . DISCONTD: labetalol  200 mg Oral BID   Continuous Infusions:   PRN Meds:.acetaminophen, acetaminophen, bisacodyl, hydrALAZINE, lidocaine, LORazepam, promethazine, promethazine, zolpidem Assessment/Plan:  .HTN (hypertension), malignant/hypertensive emergency  - secondary to noncompliance and his polysubstance abuse.Continue norvasc, labetalol, lasix, per renal. Hydralazine prn.  .Acute renal failure (ARF) on CKD3-  secondary to poorly controlled hypertension and cocaine abuse. Renal function fluctuating. Creatinine rising. If further rise may need to decrease diuretic. Good urine output. Renal US with increased parencymal echogenicity consistent with medical renal dz. Last Cr 1.4 01/2010. Renal consulting and appreciate their input and rxcs. .Abnormal EKG/elevated Troponins- he is chest pain free. avoiding b-blockers secondary to cocaine abuse.  2-d echo with EF 55-60% WITH nwma., cardiology  feel abn EKG AND TROPONINS  secondary to LVH. Patient asymptomatic. Continue BP control. .Hypokalemia- repleted. .Polysubstance abuse- once pt medically cleared we'll have social work to assist with placement at [facility] .  LOS: 6 days   Amberli Ruegg 05/26/2011, 9:59 AM

## 2011-05-27 LAB — RENAL FUNCTION PANEL
Albumin: 3.6 g/dL (ref 3.5–5.2)
BUN: 21 mg/dL (ref 6–23)
CO2: 28 mEq/L (ref 19–32)
Calcium: 9.6 mg/dL (ref 8.4–10.5)
Chloride: 99 mEq/L (ref 96–112)
Creatinine, Ser: 2.93 mg/dL — ABNORMAL HIGH (ref 0.50–1.35)
GFR calc Af Amer: 26 mL/min — ABNORMAL LOW (ref >90)
GFR calc non Af Amer: 23 mL/min — ABNORMAL LOW (ref >90)
Glucose, Bld: 91 mg/dL (ref 70–99)
Phosphorus: 4.5 mg/dL (ref 2.3–4.6)
Potassium: 4.1 mEq/L (ref 3.5–5.1)
Sodium: 137 mEq/L (ref 135–145)

## 2011-05-27 LAB — PARATHYROID HORMONE, INTACT (NO CA): PTH: 53.4 pg/mL (ref 14.0–72.0)

## 2011-05-27 MED ORDER — FUROSEMIDE 40 MG PO TABS
40.0000 mg | ORAL_TABLET | Freq: Two times a day (BID) | ORAL | Status: DC
  Administered 2011-05-27 – 2011-05-28 (×2): 40 mg via ORAL
  Filled 2011-05-27 (×3): qty 1

## 2011-05-27 NOTE — Progress Notes (Signed)
Subjective: No complaints. Objective: Vital signs in last 24 hours: Temp:  [97.7 F (36.5 C)-98.7 F (37.1 C)] 98.7 F (37.1 C) (11/26 1417) Pulse Rate:  [58-74] 74  (11/26 1417) Resp:  [16-18] 16  (11/26 1417) BP: (114-144)/(62-85) 114/62 mmHg (11/26 1417) SpO2:  [99 %-100 %] 99 % (11/26 1417) Last BM Date: 05/27/11 Intake/Output from previous day: 11/25 0701 - 11/26 0700 In: 360 [P.O.:360] Out: -  Intake/Output this shift: Total I/O In: 240 [P.O.:240] Out: -     General Appearance:    Alert, cooperative, no distress, appears stated age.  Lungs:     Clear to auscultation bilaterally, respirations unlabored.   Heart:    Regular rate and rhythm, S1 and S2 normal, no murmur, rub   or gallop  Abdomen:     Soft, non-tender, bowel sounds active all four quadrants,    no masses, no organomegaly  Extremities:   Extremities normal, atraumatic, no cyanosis or edema       Weight change:   Intake/Output Summary (Last 24 hours) at 05/27/11 1725 Last data filed at 05/27/11 1403  Gross per 24 hour  Intake    600 ml  Output      0 ml  Net    600 ml    Lab Results:   Basename 05/27/11 0550 05/26/11 0605  NA 137 136  K 4.1 3.6  CL 99 99  CO2 28 29  GLUCOSE 91 88  BUN 21 17  CREATININE 2.93* 2.49*  CALCIUM 9.6 9.6    Basename 05/25/11 0535  WBC 5.0  HGB 12.6*  HCT 37.3*  PLT 149*  MCV 97.6   PT/INR No results found for this basename: LABPROT:2,INR:2 in the last 72 hours ABG No results found for this basename: PHART:2,PCO2:2,PO2:2,HCO3:2 in the last 72 hours  Micro Results: Recent Results (from the past 240 hour(s))  MRSA PCR SCREENING     Status: Normal   Collection Time   05/21/11  4:18 PM      Component Value Range Status Comment   MRSA by PCR NEGATIVE  NEGATIVE  Final    Studies/Results: No results found. Medications:  Scheduled Meds:    . amLODipine  10 mg Oral Daily  . aspirin EC  81 mg Oral Daily  . folic acid  1 mg Oral Daily  . furosemide   40 mg Oral BID  . heparin  5,000 Units Subcutaneous Q8H  . labetalol  400 mg Oral BID  . potassium chloride  40 mEq Oral Daily  . thiamine  100 mg Oral Daily  . DISCONTD: furosemide  80 mg Oral BID   Continuous Infusions:   PRN Meds:.acetaminophen, acetaminophen, bisacodyl, hydrALAZINE, lidocaine, LORazepam, promethazine, promethazine, zolpidem Assessment/Plan:  .HTN (hypertension), malignant/hypertensive emergency  - secondary to noncompliance and his polysubstance abuse.Continue norvasc, labetalol, lasix, per renal. Hydralazine prn.  .Acute renal failure (ARF) on CKD3-  secondary to poorly controlled hypertension and cocaine abuse. Renal function fluctuating. Creatinine rising. Will decrease diuretic. Good urine output. Renal US with increased parencymal echogenicity consistent with medical renal dz. Last Cr 1.4 01/2010. Renal consulting and appreciate their input and rxcs. .Abnormal EKG/elevated Troponins- he is chest pain free. avoiding b-blockers secondary to cocaine abuse.  2-d echo with EF 55-60% WITH nwma., cardiology  feel abn EKG AND TROPONINS  secondary to LVH. Patient asymptomatic. Continue BP control. .Hypokalemia- repleted. .Polysubstance abuse- once pt medically cleared we'll have social work to assist with placement at [facility] .    LOS:  7 days   [facility] 05/27/2011, 5:25 PM

## 2011-05-27 NOTE — Progress Notes (Signed)
CSW spoke with pt and completed psychosocial assessment (pls see shadow chart). Pt reports he has never been to an inpatient rehab before and he is looking forward to going to [facility]. Pt states he has support from several family members. CSW spoke with Timmothy Sours at [facility] and he reports pt can come to the facility but he needed to be medically cleared before he could participate in the program. [facility] reports that the pt will need to have at least a weeks supply of his medications and he needs to be at the facility between 8-8:30 am. CSW has informed Dr. Grandville Silos on guidelines for admission. Pt reports his family can help with getting his medications filled so he can have them at the facility. CSW will continue to follow the pt and offer support. Zollie Scale 05/27/2011 3:43 PM [id number]

## 2011-05-28 LAB — BASIC METABOLIC PANEL
BUN: 25 mg/dL — ABNORMAL HIGH (ref 6–23)
CO2: 28 mEq/L (ref 19–32)
Calcium: 9.3 mg/dL (ref 8.4–10.5)
Chloride: 101 mEq/L (ref 96–112)
Creatinine, Ser: 3.04 mg/dL — ABNORMAL HIGH (ref 0.50–1.35)
GFR calc Af Amer: 25 mL/min — ABNORMAL LOW (ref >90)
GFR calc non Af Amer: 22 mL/min — ABNORMAL LOW (ref >90)
Glucose, Bld: 101 mg/dL — ABNORMAL HIGH (ref 70–99)
Potassium: 3.7 mEq/L (ref 3.5–5.1)
Sodium: 139 mEq/L (ref 135–145)

## 2011-05-28 MED ORDER — AMLODIPINE BESYLATE 10 MG PO TABS: 10.0000 mg | ORAL_TABLET | Freq: Every day | ORAL | Status: DC

## 2011-05-28 MED ORDER — LABETALOL HCL 200 MG PO TABS: 400.0000 mg | ORAL_TABLET | Freq: Two times a day (BID) | ORAL | Status: DC

## 2011-05-28 MED ORDER — ASPIRIN 81 MG PO TBEC: 81.0000 mg | DELAYED_RELEASE_TABLET | Freq: Every day | ORAL | Status: DC

## 2011-05-28 MED ORDER — FUROSEMIDE 40 MG PO TABS: 40.0000 mg | ORAL_TABLET | Freq: Two times a day (BID) | ORAL | Status: DC

## 2011-05-28 MED ORDER — POTASSIUM CHLORIDE CRYS ER 20 MEQ PO TBCR: 40.0000 meq | EXTENDED_RELEASE_TABLET | Freq: Every day | ORAL | Status: DC

## 2011-05-28 NOTE — Progress Notes (Signed)
PATIENT NEEDS BMET TO BE DONE IN ONE WEEK; TELEPHONE CALL TO [facility], THEY WILL NOT SEE THE PATIENT UNTIL HIS APT IN January- [facility] CALLED - THEY WILL BE ABLE TO DO THE BLOOD WORK - CLINICAL INFORMATION FAXED TO THE [facility] FAX # [phone]; PATIENTS SISTER CRYSTAL CALLED CONCERNING BLOOD WORK - CRYSTAL WILL GIVE THE INFORMATION TO THE PATIENT; Emmet RN, BSN, MHA.

## 2011-05-28 NOTE — Progress Notes (Signed)
Dr. Grandville Silos phoned by [facility] regarding patient stating he wants to see doctor or signing self out of hospital, explained to patient he would be signing out [place] and doctor stated he would be in a little later to see him.  Patient states he will wait for a bit

## 2011-05-28 NOTE — Progress Notes (Signed)
TALKED TO PATIENT ABOUT FOLLOW UP MEDICAL CARE; PATIENT IS KNOWLEDGEABLE ABOUT [facility] AND IS AGREEABLE TO RETURN TO THE CLINIC; ELIGIBILITY APT IS Jul 11, 2011 AT 3:15 AND APT WITH DR McIntosh 14, 2013 AT Bobbie Stack RN, BSN, MHA.

## 2011-05-28 NOTE — Discharge Summary (Signed)
Discharge Summary  Corey Guerrero MR#: [id number]  DOB:08-23-1955  Date of Admission: 05/20/2011 Date of Discharge: 05/28/2011  Patient's PCP: No primary provider on file.  Attending Physician:Carianne Taira  Consults:    1. CARDIOLOGY - Dr Dorris Carnes 2. Nephrology - Dr Marval Regal   Discharge Diagnoses: HTN (hypertension), malignant Present on Admission:  .HTN (hypertension), malignant .Hypokalemia .Acute renal failure (ARF)on CKD 3 .Abnormal EKG .Polysubstance abuse  Brief Admitting History and Physical The patient is a 55 year old black male with past medical history significant for polysubstance abuse, hypertension, abnormal EKG in 2011 who presented to the ED for medical clearance for admission tp [facility] rehabilitation next week. he was seen in the ED and his blood pressures were noted to be markedly elevated -235/139 initially, and on recheck improved to 184/111. Patient also had an EKG done and a newT-wave changes were noted as well as abnormalities previously noted in 2011. Cardiology was consulted and they requested admission to the hospitalist service for our control of his blood pressures -the impression was that the EKG changes were likely due to his hypertension and LVH with repolarization abnormalities. Cardiac enzymes done in the ED revealed a normal troponins. The patient denies a chest pain, shortness of breath, cough, fevers, dysuria, melena,hematochezia, nausea or vomiting and no diarrhea.  The patient states that his last cocaine use was yesterday. He he also admits that he has been noncompliant with the blood pressure medications he was supposed to be taking for at least a year. He is admitted for further evaluation and management   Discharge Medications Current Discharge Medication List    START taking these medications   Details  amLODipine (NORVASC) 10 MG tablet Take 1 tablet (10 mg total) by mouth daily. Qty: 60 tablet, Refills: 0    aspirin EC 81 MG EC  tablet Take 1 tablet (81 mg total) by mouth daily.    furosemide (LASIX) 40 MG tablet Take 1 tablet (40 mg total) by mouth 2 (two) times daily. Qty: 90 tablet, Refills: 0    labetalol (NORMODYNE) 200 MG tablet Take 2 tablets (400 mg total) by mouth 2 (two) times daily. Qty: 60 tablet, Refills: 0    potassium chloride SA (K-DUR,KLOR-CON) 20 MEQ tablet Take 2 tablets (40 mEq total) by mouth daily. Qty: 90 tablet, Refills: 0        Hospital Course: 1.HTN (hypertension), malignant  On admission patient was noted to have severely elevated blood pressure with systolic blood pressure in the 235/139s. It was felt patient's malignant hypertension was likely secondary to medication noncompliance and polysubstance abuse. Patient was placed on a nicardipine drip and admitted to the step down unit and cardiac enzymes were cycled. EKG which was done was noted to be abnormal the patient was seen in consultation by cardiology and was felt his abnormal EKG was due to hypertension and LVH. Cardiology followed the patient during the initial part of the hospitalization. Patient's cardiac enzymes had  elevated troponins and a such a 2-D echo was also obtained. Patient's blood pressure improved on a nicardipine drip and patient was subsequently transitioned to oral calcium channel blocker of Cardizem. 2-D echo which was obtained showed moderate LVH with no other abnormalities. Labetalol was subsequently added to his regimen for better pressure control and patient was subsequently transferred to the telemetry floor. During this time. A nephrology consultation had been obtained for patient's acute on chronic kidney disease. Patient was seen in consultation by Dr. Myna Hidalgo on 06/23/2011 at which point  in time his heart rate was noted to be less than 60 and a such as diltiazem was discontinued and patient was started on Norvasc. Patient's blood pressure improved and at diuretic was subsequently added to his regimen. Patient  was maintained on these medications with good blood pressure control and do too increase in his  creatinine his Lasix dose was decreased by half. Patient continued to have good blood pressure control and he will subsequently be discharged home on this new regimen. Patient will need a basic metabolic profile done one week post discharge to followup on his electrolytes and renal function. [facility] and further management of his hypertension. Patient will be discharged in stable and improved condition. #2 hypokalemia- patient was noted to be hypokalemic during the hospitalization. This was felt to be secondary to diuretics. Patient's potassium was subsequently repleted. Patient will need followup basic metabolic profile done one week post discharge. #3 abnormal EKG  On admission patient was noted to have an abnormal EKG. A cardiology consultation was obtained and patient was seen in consultation by Dr. [name] of about cardiology on 05/20/2011. It was felt at that time that patient's abnormal EKG was secondary to his uncontrolled hypertension and cocaine abuse. Cardiac enzymes were cycled and came back with elevated troponins. 2-D echo was subsequently obtained which was consistent with moderate LVH, with EF of 55-60%, with no wall motion abnormalities. Patient was seen per cardiology, it was felt his abnormal echo and elevated troponins were secondary to hypertensive malignancy and polysubstance abuse. It was recommended to continue blood pressure management and no further cardiac workup was needed at this time. Patient be discharged in stable condition. #4 acute on chronic kidney disease stage III On admission patient was noted to have an elevated creatinine, renal ultrasound which was done was consistent with chronic medical disease. Urinalysis which was done had non-nephrotic proteinuria and it was felt underlying cause of patient's kidney disease was his  malignant hypertension and cocaine abuse. Patient's creatinine on admission was 2.7 with some blood pressure control then went down to 2.0 and started to rise back up. A nephrology consultation was obtained patient was seen in consultation by Dr. Marval Regal of nephrology. It was recommended per nephrology fall good blood pressure control avoidance of NSAIDs and Cox 2 inhibitors and no ACE or ARB is at this time. Blood pressure medications were adjusted and his renal function was followed. It was felt per nephrology the patient has chronic kidney disease stage III. And with better control of his blood pressure was going to be some orbital regulatory effect such that his renal function may slowly rise up and then plateau and that subsequently start to improve. At diuretic was added to patient's regimen and his blood pressure that his creatinine slowly rose up to 2.93 the day prior to discharge. Patient's Lasix dose was decreased in half with minimal increase in his renal function by day of discharge the creatinine of 3.04. Patient will be discharged on this current dose of diuretic and would need a basic metabolic profile done one week post discharge to followup on his renal function. If this further worsening of his renal function his diuretic dose will need to be decreased further. Patient will need to followup with nephrology as an outpatient as stated above. Patient is currently in stable condition. #5 polysubstance abuse Patient was seeking polysubstance rehabilitation  prior to admission and was told to present to the ED  for medical clearance. Patient was subsequently admitted secondary to the above problems. Patient remained stable during the hospitalization. And patient has been cleared medically to attend polysubstance rehabilitation at [facility] on 05/29/2011. Patient be discharged in stable condition.      Present on Admission:  .HTN (hypertension), malignant .Hypokalemia .Acute renal failure  (ARF) .Abnormal EKG .Polysubstance abuse .Renal failure (ARF), acute on chronic   Day of Discharge BP 122/85  Pulse 59  Temp(Src) 98 F (36.7 C) (Oral)  Resp 20  Ht 5\' 6"  (1.676 m)  Wt 69.5 kg (153 lb 3.5 oz)  BMI 24.73 kg/m2  SpO2 98% General: Alert, awake, oriented x3, in no acute distress. HEENT: No bruits, no goiter. Heart: Regular rate and rhythm, without murmurs, rubs, gallops. Lungs: Clear to auscultation bilaterally. Abdomen: Soft, nontender, nondistended, positive bowel sounds. Extremities: No clubbing cyanosis or edema with positive pedal pulses. Neuro: Grossly intact, nonfocal.   Results for orders placed during the hospital encounter of 05/20/11 (from the past 48 hour(s))  RENAL FUNCTION PANEL     Status: Abnormal   Collection Time   05/27/11  5:50 AM      Component Value Range Comment   Sodium 137  135 - 145 (mEq/L)    Potassium 4.1  3.5 - 5.1 (mEq/L)    Chloride 99  96 - 112 (mEq/L)    CO2 28  19 - 32 (mEq/L)    Glucose, Bld 91  70 - 99 (mg/dL)    BUN 21  6 - 23 (mg/dL)    Creatinine, Ser 2.93 (*) 0.50 - 1.35 (mg/dL)    Calcium 9.6  8.4 - 10.5 (mg/dL)    Phosphorus 4.5  2.3 - 4.6 (mg/dL)    Albumin 3.6  3.5 - 5.2 (g/dL)    GFR calc non Af Amer 23 (*) >90 (mL/min)    GFR calc Af Amer 26 (*) >90 (mL/min)   BASIC METABOLIC PANEL     Status: Abnormal   Collection Time   05/28/11  5:28 AM      Component Value Range Comment   Sodium 139  135 - 145 (mEq/L)    Potassium 3.7  3.5 - 5.1 (mEq/L)    Chloride 101  96 - 112 (mEq/L)    CO2 28  19 - 32 (mEq/L)    Glucose, Bld 101 (*) 70 - 99 (mg/dL)    BUN 25 (*) 6 - 23 (mg/dL)    Creatinine, Ser 3.04 (*) 0.50 - 1.35 (mg/dL)    Calcium 9.3  8.4 - 10.5 (mg/dL)    GFR calc non Af Amer 22 (*) >90 (mL/min)    GFR calc Af Amer 25 (*) >90 (mL/min)     US Renal  05/22/2011  *RADIOLOGY REPORT*  Clinical Data: Acute renal failure.  RENAL/URINARY TRACT ULTRASOUND COMPLETE  Comparison:  CT scan of the abdomen dated and  01/25/2010  Findings:  Right Kidney:  9.9 cm in length.  Diffuse increased echogenicity of the renal parenchyma consistent with renal medical disease.  No obstruction.  Left Kidney:  9.6 cm in length.  Diffuse increased echogenicity of the renal parenchyma consistent with renal medical disease.  No hydronephrosis.  Bladder:  Normal.  IMPRESSION:  1.  Increased echogenicity of the renal parenchyma bilaterally consistent with renal medical disease. 2.  No hydronephrosis or mass lesions.  Original Report Authenticated By: Larey Seat, M.D.     Disposition: Home to go to [facility] on 05/29/11 Diet: low salt diet  Activity: No  restrictions.   Follow-up Appts: Discharge Orders    Future Orders Please Complete By Expires   Diet - low sodium heart healthy      Discharge instructions      Comments:   [facility] up with Dr Arty Baumgartner in 2 weeks (Nephrologist) Follow up at [facility] tomorrow 05/29/11   Activity as tolerated - No restrictions         TESTS THAT NEED FOLLOW-UP BMET in 1 week  Time spent on discharge, talking to the patient, and coordinating care: 60 mins.   SignedIrine Seal 05/28/2011, 1:31 PM

## 2011-06-20 ENCOUNTER — Encounter (HOSPITAL_BASED_OUTPATIENT_CLINIC_OR_DEPARTMENT_OTHER): Payer: Self-pay | Admitting: Family Medicine

## 2011-06-20 ENCOUNTER — Emergency Department (HOSPITAL_BASED_OUTPATIENT_CLINIC_OR_DEPARTMENT_OTHER)
Admission: EM | Admit: 2011-06-20 | Discharge: 2011-06-20 | Disposition: A | Discharge status: Home / Self Care | Payer: Self-pay | Attending: Emergency Medicine | Admitting: Emergency Medicine

## 2011-06-20 ENCOUNTER — Emergency Department (INDEPENDENT_AMBULATORY_CARE_PROVIDER_SITE_OTHER): Payer: Self-pay

## 2011-06-20 DIAGNOSIS — M2010 Hallux valgus (acquired), unspecified foot: Secondary | ICD-10-CM

## 2011-06-20 DIAGNOSIS — M7730 Calcaneal spur, unspecified foot: Secondary | ICD-10-CM | POA: Insufficient documentation

## 2011-06-20 DIAGNOSIS — M722 Plantar fascial fibromatosis: Secondary | ICD-10-CM | POA: Insufficient documentation

## 2011-06-20 DIAGNOSIS — I10 Essential (primary) hypertension: Secondary | ICD-10-CM | POA: Insufficient documentation

## 2011-06-20 DIAGNOSIS — W219XXA Striking against or struck by unspecified sports equipment, initial encounter: Secondary | ICD-10-CM

## 2011-06-20 DIAGNOSIS — M79609 Pain in unspecified limb: Secondary | ICD-10-CM
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
## 2011-06-20 NOTE — ED Notes (Addendum)
Pt c/o right heel pain since yesterday while playing kickball and previously had pain to same area 3 wks ago. Pt using crutches upon arrival today. Pt is in [facility] for polysubstance abuse

## 2011-07-21 ENCOUNTER — Other Ambulatory Visit: Payer: Self-pay | Admitting: Internal Medicine

## 2011-08-19 ENCOUNTER — Other Ambulatory Visit: Payer: Self-pay | Admitting: Internal Medicine

## 2011-10-15 ENCOUNTER — Encounter (HOSPITAL_COMMUNITY): Payer: Self-pay | Admitting: Adult Health

## 2011-10-15 ENCOUNTER — Emergency Department (HOSPITAL_COMMUNITY): Payer: Self-pay

## 2011-10-15 ENCOUNTER — Inpatient Hospital Stay (HOSPITAL_COMMUNITY)
Admission: EM | Admit: 2011-10-15 | Discharge: 2011-10-17 | DRG: 683 | Disposition: A | Discharge status: Home / Self Care | Payer: Self-pay | Attending: Internal Medicine | Admitting: Internal Medicine

## 2011-10-15 ENCOUNTER — Inpatient Hospital Stay (HOSPITAL_COMMUNITY): Payer: Self-pay

## 2011-10-15 ENCOUNTER — Other Ambulatory Visit: Payer: Self-pay

## 2011-10-15 DIAGNOSIS — R079 Chest pain, unspecified: Secondary | ICD-10-CM | POA: Diagnosis present

## 2011-10-15 DIAGNOSIS — R9431 Abnormal electrocardiogram [ECG] [EKG]: Secondary | ICD-10-CM | POA: Diagnosis present

## 2011-10-15 DIAGNOSIS — R51 Headache: Secondary | ICD-10-CM | POA: Diagnosis not present

## 2011-10-15 DIAGNOSIS — F1910 Other psychoactive substance abuse, uncomplicated: Secondary | ICD-10-CM | POA: Diagnosis present

## 2011-10-15 DIAGNOSIS — N179 Acute kidney failure, unspecified: Secondary | ICD-10-CM | POA: Diagnosis present

## 2011-10-15 DIAGNOSIS — I10 Essential (primary) hypertension: Secondary | ICD-10-CM | POA: Diagnosis present

## 2011-10-15 DIAGNOSIS — I129 Hypertensive chronic kidney disease with stage 1 through stage 4 chronic kidney disease, or unspecified chronic kidney disease: Principal | ICD-10-CM | POA: Diagnosis present

## 2011-10-15 DIAGNOSIS — H9209 Otalgia, unspecified ear: Secondary | ICD-10-CM | POA: Diagnosis not present

## 2011-10-15 DIAGNOSIS — R6884 Jaw pain: Secondary | ICD-10-CM | POA: Diagnosis present

## 2011-10-15 DIAGNOSIS — N189 Chronic kidney disease, unspecified: Secondary | ICD-10-CM | POA: Diagnosis present

## 2011-10-15 DIAGNOSIS — N182 Chronic kidney disease, stage 2 (mild): Secondary | ICD-10-CM | POA: Diagnosis present

## 2011-10-15 DIAGNOSIS — F1010 Alcohol abuse, uncomplicated: Secondary | ICD-10-CM | POA: Diagnosis present

## 2011-10-15 DIAGNOSIS — F1410 Cocaine abuse, uncomplicated: Secondary | ICD-10-CM | POA: Diagnosis present

## 2011-10-15 DIAGNOSIS — M549 Dorsalgia, unspecified: Secondary | ICD-10-CM | POA: Diagnosis present

## 2011-10-15 DIAGNOSIS — E876 Hypokalemia: Secondary | ICD-10-CM | POA: Diagnosis present

## 2011-10-15 LAB — BASIC METABOLIC PANEL
BUN: 21 mg/dL (ref 6–23)
CO2: 26 mEq/L (ref 19–32)
Calcium: 9.6 mg/dL (ref 8.4–10.5)
Chloride: 98 mEq/L (ref 96–112)
Creatinine, Ser: 2.29 mg/dL — ABNORMAL HIGH (ref 0.50–1.35)
GFR calc Af Amer: 36 mL/min — ABNORMAL LOW (ref >90)
GFR calc non Af Amer: 31 mL/min — ABNORMAL LOW (ref >90)
Glucose, Bld: 105 mg/dL — ABNORMAL HIGH (ref 70–99)
Potassium: 2.9 mEq/L — ABNORMAL LOW (ref 3.5–5.1)
Sodium: 137 mEq/L (ref 135–145)

## 2011-10-15 LAB — CARDIAC PANEL(CRET KIN+CKTOT+MB+TROPI)
CK, MB: 6.7 ng/mL (ref 0.3–4.0)
CK, MB: 6.5 ng/mL (ref 0.3–4.0)
CK, MB: 5 ng/mL — ABNORMAL HIGH (ref 0.3–4.0)
Relative Index: 1.6 (ref 0.0–2.5)
Relative Index: 1.7 (ref 0.0–2.5)
Relative Index: 1.7 (ref 0.0–2.5)
Total CK: 423 U/L — ABNORMAL HIGH (ref 7–232)
Total CK: 377 U/L — ABNORMAL HIGH (ref 7–232)
Total CK: 293 U/L — ABNORMAL HIGH (ref 7–232)
Troponin I: <0.3 ng/mL (ref <0.30)
Troponin I: <0.3 ng/mL (ref <0.30)
Troponin I: <0.3 ng/mL (ref <0.30)

## 2011-10-15 LAB — CBC
HCT: 43.6 % (ref 39.0–52.0)
Hemoglobin: 14.9 g/dL (ref 13.0–17.0)
MCH: 32.3 pg (ref 26.0–34.0)
MCHC: 34.2 g/dL (ref 30.0–36.0)
MCV: 94.6 fL (ref 78.0–100.0)
Platelets: 166 10*3/uL (ref 150–400)
RBC: 4.61 MIL/uL (ref 4.22–5.81)
RDW: 12.3 % (ref 11.5–15.5)
WBC: 8.8 10*3/uL (ref 4.0–10.5)

## 2011-10-15 LAB — MAGNESIUM: Magnesium: 1.9 mg/dL (ref 1.5–2.5)

## 2011-10-15 LAB — RAPID URINE DRUG SCREEN, HOSP PERFORMED
Amphetamines: NOT DETECTED
Barbiturates: NOT DETECTED
Benzodiazepines: NOT DETECTED
Cocaine: POSITIVE — AB
Opiates: NOT DETECTED
Tetrahydrocannabinol: NOT DETECTED

## 2011-10-15 LAB — PRO B NATRIURETIC PEPTIDE: Pro B Natriuretic peptide (BNP): 4642 pg/mL — ABNORMAL HIGH (ref 0–125)

## 2011-10-15 LAB — MRSA PCR SCREENING: MRSA by PCR: NEGATIVE

## 2011-10-15 MED ORDER — DIPHENHYDRAMINE HCL 50 MG PO CAPS: 50.0000 mg | ORAL_CAPSULE | ORAL | Status: DC | PRN

## 2011-10-15 MED ORDER — POTASSIUM CHLORIDE CRYS ER 20 MEQ PO TBCR
40.0000 meq | EXTENDED_RELEASE_TABLET | ORAL | Status: AC
  Administered 2011-10-15 (×3): 40 meq via ORAL
  Filled 2011-10-15 (×3): qty 2

## 2011-10-15 MED ORDER — ONDANSETRON HCL 4 MG PO TABS: 4.0000 mg | ORAL_TABLET | Freq: Four times a day (QID) | ORAL | Status: DC | PRN

## 2011-10-15 MED ORDER — FAMOTIDINE 20 MG PO TABS
20.0000 mg | ORAL_TABLET | Freq: Two times a day (BID) | ORAL | Status: DC
  Administered 2011-10-16 – 2011-10-17 (×4): 20 mg via ORAL
  Filled 2011-10-15 (×5): qty 1

## 2011-10-15 MED ORDER — SODIUM CHLORIDE 0.9 % IJ SOLN
3.0000 mL | Freq: Two times a day (BID) | INTRAMUSCULAR | Status: DC
  Administered 2011-10-15 – 2011-10-17 (×4): 3 mL via INTRAVENOUS

## 2011-10-15 MED ORDER — ONDANSETRON HCL 4 MG/2ML IJ SOLN: 4.0000 mg | Freq: Four times a day (QID) | INTRAMUSCULAR | Status: DC | PRN

## 2011-10-15 MED ORDER — DIPHENHYDRAMINE HCL 25 MG PO CAPS
25.0000 mg | ORAL_CAPSULE | ORAL | Status: DC | PRN
  Administered 2011-10-16: 25 mg via ORAL
  Filled 2011-10-15: qty 1

## 2011-10-15 MED ORDER — DIPHENHYDRAMINE HCL 50 MG/ML IJ SOLN
25.0000 mg | Freq: Once | INTRAMUSCULAR | Status: AC
  Administered 2011-10-15: 25 mg via INTRAVENOUS
  Filled 2011-10-15: qty 1

## 2011-10-15 MED ORDER — LABETALOL HCL 5 MG/ML IV SOLN
10.0000 mg | INTRAVENOUS | Status: DC | PRN
  Administered 2011-10-15: 10 mg via INTRAVENOUS
  Filled 2011-10-15: qty 4

## 2011-10-15 MED ORDER — NITROGLYCERIN IN D5W 200-5 MCG/ML-% IV SOLN
5.0000 ug/min | INTRAVENOUS | Status: DC
  Administered 2011-10-15: 120 ug/min via INTRAVENOUS
  Administered 2011-10-15: 5 ug/min via INTRAVENOUS
  Filled 2011-10-15 (×5): qty 250

## 2011-10-15 MED ORDER — ASPIRIN 325 MG PO TABS
325.0000 mg | ORAL_TABLET | ORAL | Status: DC
  Filled 2011-10-15: qty 1

## 2011-10-15 MED ORDER — ASPIRIN 81 MG PO CHEW
324.0000 mg | CHEWABLE_TABLET | Freq: Once | ORAL | Status: AC
  Administered 2011-10-15: 324 mg via ORAL
  Filled 2011-10-15: qty 1
  Filled 2011-10-15: qty 4

## 2011-10-15 MED ORDER — MORPHINE SULFATE 2 MG/ML IJ SOLN
2.0000 mg | INTRAMUSCULAR | Status: DC | PRN
  Administered 2011-10-15 (×2): 2 mg via INTRAVENOUS
  Filled 2011-10-15 (×2): qty 1

## 2011-10-15 MED ORDER — ACETAMINOPHEN 325 MG PO TABS
650.0000 mg | ORAL_TABLET | Freq: Four times a day (QID) | ORAL | Status: DC | PRN
  Administered 2011-10-15 (×2): 650 mg via ORAL
  Filled 2011-10-15 (×2): qty 2

## 2011-10-15 MED ORDER — ACETAMINOPHEN 650 MG RE SUPP: 650.0000 mg | Freq: Four times a day (QID) | RECTAL | Status: DC | PRN

## 2011-10-15 MED ORDER — ADULT MULTIVITAMIN W/MINERALS CH
1.0000 | ORAL_TABLET | Freq: Every day | ORAL | Status: DC
  Administered 2011-10-15 – 2011-10-17 (×3): 1 via ORAL
  Filled 2011-10-15 (×3): qty 1

## 2011-10-15 MED ORDER — FAMOTIDINE IN NACL 20-0.9 MG/50ML-% IV SOLN
20.0000 mg | Freq: Once | INTRAVENOUS | Status: AC
  Administered 2011-10-15: 20 mg via INTRAVENOUS
  Filled 2011-10-15: qty 50

## 2011-10-15 MED ORDER — LABETALOL HCL 5 MG/ML IV SOLN
2.0000 mg/min | INTRAVENOUS | Status: DC
  Administered 2011-10-15: 0.133 mg/min via INTRAVENOUS
  Filled 2011-10-15: qty 100

## 2011-10-15 MED ORDER — METHYLPREDNISOLONE SODIUM SUCC 125 MG IJ SOLR
125.0000 mg | Freq: Four times a day (QID) | INTRAMUSCULAR | Status: AC
  Administered 2011-10-15 – 2011-10-16 (×2): 125 mg via INTRAVENOUS
  Filled 2011-10-15 (×2): qty 2

## 2011-10-15 MED ORDER — LORAZEPAM 2 MG/ML IJ SOLN
1.0000 mg | Freq: Once | INTRAMUSCULAR | Status: AC
  Administered 2011-10-15: 1 mg via INTRAVENOUS
  Filled 2011-10-15: qty 1

## 2011-10-15 MED ORDER — SENNA 8.6 MG PO TABS
1.0000 | ORAL_TABLET | Freq: Two times a day (BID) | ORAL | Status: DC
  Administered 2011-10-15 – 2011-10-17 (×5): 8.6 mg via ORAL
  Filled 2011-10-15 (×5): qty 1

## 2011-10-15 MED ORDER — DOCUSATE SODIUM 100 MG PO CAPS
100.0000 mg | ORAL_CAPSULE | Freq: Two times a day (BID) | ORAL | Status: DC
  Administered 2011-10-15 – 2011-10-17 (×5): 100 mg via ORAL
  Filled 2011-10-15 (×6): qty 1

## 2011-10-15 NOTE — ED Notes (Signed)
Pt presents with a c/c of severe back and chest pain following consumption of cocaine earlier today.  Pt st's no SOB.  Pt also complains of a nodule just under L side of jaw.

## 2011-10-15 NOTE — Progress Notes (Signed)
CSW received consult for polysubstance abuse and reviewed chart, spoke with RN. CSW will assess Pt fully in the am. Pt positive for cocaine on this admit. Pt sleepy this am, per RN. CSW to assess on 4/17. [place], [place] 10/15/2011 12:02 PM [phone]
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
## 2011-10-15 NOTE — Progress Notes (Signed)
CARE MANAGEMENT NOTE 10/15/2011  Patient:  Corey Guerrero, Corey Guerrero   Account Number:  [id number]  Date Initiated:  10/15/2011  Documentation initiated by:  Jakyron Fabro  Subjective/Objective Assessment:   pt with hx of coccaine and etoh abuse came in with increasing chest pain, htn,admitted sdu on iv ntg drip to control pain     Action/Plan:   lives alone   Anticipated DC Date:  10/18/2011   Anticipated DC Plan:  HOME/SELF CARE  In-house referral  Clinical Social Worker      DC Planning Services  NA      [facility] Choice  NA   Choice offered to / List presented to:  NA   DME arranged  NA      DME agency  NA     [place] arranged  NA      [place] agency  NA   Status of service:  In process, will continue to follow Medicare Important Message given?  NA - LOS <3 / Initial given by admissions (If response is "NO", the following Medicare IM given date fields will be blank) Date Medicare IM given:   Date Additional Medicare IM given:    Discharge Disposition:    Per UR Regulation:  Reviewed for med. necessity/level of care/duration of stay  If discussed at [place] of Stay Meetings, dates discussed:    Comments:  [id number]/Smitty Ackerley Victorio Palm Case Management [id number]

## 2011-10-15 NOTE — ED Notes (Signed)
Pt. On cardiac monitor. 

## 2011-10-15 NOTE — Progress Notes (Addendum)
Subjective: No CP or SOB at this moment. Patient reports some HA and also left ear discomfort. Afebrile.  Objective: Vital signs in last 24 hours: Temp:  [97.7 F (36.5 C)-98.7 F (37.1 C)] 97.7 F (36.5 C) (04/16 0800) Pulse Rate:  [60-79] 74  (04/16 0900) Resp:  [16-23] 18  (04/16 0900) BP: (195-249)/(122-167) 220/129 mmHg (04/16 0900) SpO2:  [97 %-100 %] 97 % (04/16 0900) FiO2 (%):  [21 %] 21 % (04/16 0149) Weight:  [66.3 kg (146 lb 2.6 oz)] 66.3 kg (146 lb 2.6 oz) (04/16 0630) Weight change:  Last BM Date: 10/15/11  Intake/Output from previous day: 04/15 0701 - 04/16 0700 In: -  Out: 500 [Urine:500] Total I/O In: 36 [I.V.:36] Out: -    Physical Exam: General: Alert, awake, oriented x3, in no acute distress. HEENT: No bruits, no goiter. Left and right ear with no tympanic membrane bulging or erythema; no discharges. Heart: Regular rate and rhythm, without murmurs, rubs, gallops. Lungs: Clear to auscultation bilaterally. Abdomen: Soft, nontender, nondistended, positive bowel sounds. Extremities: No clubbing, cyanosis or edema with positive pedal pulses. Neuro: Grossly intact, nonfocal.  Lab Results: Basic Metabolic Panel:  Basename 10/15/11 0205  NA 137  K 2.9*  CL 98  CO2 26  GLUCOSE 105*  BUN 21  CREATININE 2.29*  CALCIUM 9.6  MG --  PHOS --   CBC:  Basename 10/15/11 0205  WBC 8.8  NEUTROABS --  HGB 14.9  HCT 43.6  MCV 94.6  PLT 166   Cardiac Enzymes:  Basename 10/15/11 0826 10/15/11 0205  CKTOTAL PENDING 423*  CKMB PENDING 6.7*  CKMBINDEX -- --  TROPONINI <0.30 <0.30   BNP:  Basename 10/15/11 0149  PROBNP 4642.0*   Urine Drug Screen: Drugs of Abuse     Component Value Date/Time   LABOPIA NONE DETECTED 10/15/2011 0257   COCAINSCRNUR POSITIVE* 10/15/2011 0257   LABBENZ NONE DETECTED 10/15/2011 0257   AMPHETMU NONE DETECTED 10/15/2011 0257   THCU NONE DETECTED 10/15/2011 0257   LABBARB NONE DETECTED 10/15/2011 0257     Studies/Results: Chest Portable 1 View  10/15/2011  *RADIOLOGY REPORT*  Clinical Data: Upper back pain.  PORTABLE CHEST - 1 VIEW  Comparison: Chest radiograph performed 01/30/2010  Findings: The lungs are well-aerated and clear.  There is no evidence of focal opacification, pleural effusion or pneumothorax.  The cardiomediastinal silhouette is within normal limits.  No acute osseous abnormalities are seen.  IMPRESSION: No acute cardiopulmonary process seen.  Original Report Authenticated By: Santa Lighter, M.D.    Medications: Scheduled Meds:   . aspirin  324 mg Oral Once  . docusate sodium  100 mg Oral BID  . LORazepam  1 mg Intravenous Once  . LORazepam  1 mg Intravenous Once  . senna  1 tablet Oral BID  . sodium chloride  3 mL Intravenous Q12H  . DISCONTD: aspirin  325 mg Oral STAT   Continuous Infusions:   . nitroGLYCERIN 70 mcg/min (10/15/11 0900)   PRN Meds:.acetaminophen, acetaminophen, labetalol, morphine, ondansetron (ZOFRAN) IV, ondansetron  Assessment/Plan: 1-Chest pain: most likely secondary to cocaine abuse and demand ischemis in settings of accelerated HTN. Will continue tx with NTG drip, ASA, follow cardiac enzymes and repeat EKG. Troponin are negative so far; is becomes positive will involved cardiology.   2-HTN (hypertension), malignant: 2/2 to cocaine use on top of underlying uncontrolled HTN. Will resume PO meds, continue NTG drip and follow low sodium diet.  3-Hypokalemia: will replete and check Mg level.  4-Abnormal  EKG: unchanged from 05/2011; will cycle CE'z, continue tx for accelerated HTN and continue ASA.   5-Polysubstance abuse: counseling provided; once he is medically optimized will discussed again about inpatient vs outpatient option for substance abuse.  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  7-Jaw pain and difficulty swallowing: no erythema or exudate inside his mouth; but there is swelling  on left base of his cheek and submandibular area. Will change diet to Dys 1 and will get CT of neck w/o contrast (due to renal failure). Will remove denture.  8-Ear pain: examined and no signs of infection.   9-DVT:SCD's    LOS: 0 days   Alyha Marines [facility] [phone]  10/15/2011, 9:28 AM

## 2011-10-15 NOTE — ED Notes (Signed)
Social worker consult by Raeanne Barry secretary-not Darlina Guys NT
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
## 2011-10-15 NOTE — ED Notes (Signed)
Placed pt on 3L [place]

## 2011-10-15 NOTE — ED Notes (Signed)
Notified RN, Manus Gunning of elevated blood pressure.

## 2011-10-15 NOTE — ED Notes (Addendum)
Chest pain, right shoulder pain that radiates to back associated with diaphoresis that began 3-4 days ago and has worsened. BP 249/167. Admits to cocaine use today

## 2011-10-16 LAB — BASIC METABOLIC PANEL
BUN: 20 mg/dL (ref 6–23)
CO2: 25 mEq/L (ref 19–32)
Calcium: 9.6 mg/dL (ref 8.4–10.5)
Chloride: 96 mEq/L (ref 96–112)
Creatinine, Ser: 2.34 mg/dL — ABNORMAL HIGH (ref 0.50–1.35)
GFR calc Af Amer: 35 mL/min — ABNORMAL LOW (ref >90)
GFR calc non Af Amer: 30 mL/min — ABNORMAL LOW (ref >90)
Glucose, Bld: 183 mg/dL — ABNORMAL HIGH (ref 70–99)
Potassium: 3.9 mEq/L (ref 3.5–5.1)
Sodium: 131 mEq/L — ABNORMAL LOW (ref 135–145)

## 2011-10-16 LAB — CARDIAC PANEL(CRET KIN+CKTOT+MB+TROPI)
CK, MB: 3.9 ng/mL (ref 0.3–4.0)
Relative Index: 1.6 (ref 0.0–2.5)
Total CK: 243 U/L — ABNORMAL HIGH (ref 7–232)
Troponin I: <0.3 ng/mL (ref <0.30)

## 2011-10-16 LAB — CBC
HCT: 39.9 % (ref 39.0–52.0)
Hemoglobin: 13.7 g/dL (ref 13.0–17.0)
MCH: 32.4 pg (ref 26.0–34.0)
MCHC: 34.3 g/dL (ref 30.0–36.0)
MCV: 94.3 fL (ref 78.0–100.0)
Platelets: 178 10*3/uL (ref 150–400)
RBC: 4.23 MIL/uL (ref 4.22–5.81)
RDW: 12 % (ref 11.5–15.5)
WBC: 12.2 10*3/uL — ABNORMAL HIGH (ref 4.0–10.5)

## 2011-10-16 MED ORDER — HYDROCHLOROTHIAZIDE 25 MG PO TABS
25.0000 mg | ORAL_TABLET | Freq: Every day | ORAL | Status: DC
  Administered 2011-10-16 – 2011-10-17 (×2): 25 mg via ORAL
  Filled 2011-10-16 (×2): qty 1

## 2011-10-16 MED ORDER — POTASSIUM CHLORIDE CRYS ER 20 MEQ PO TBCR
40.0000 meq | EXTENDED_RELEASE_TABLET | Freq: Two times a day (BID) | ORAL | Status: DC
  Administered 2011-10-16 – 2011-10-17 (×3): 40 meq via ORAL
  Filled 2011-10-16 (×4): qty 2

## 2011-10-16 MED ORDER — PREDNISONE 20 MG PO TABS
40.0000 mg | ORAL_TABLET | Freq: Every day | ORAL | Status: DC
Start: 2011-10-16 — End: 2011-10-17
  Administered 2011-10-16 – 2011-10-17 (×2): 40 mg via ORAL
  Filled 2011-10-16 (×2): qty 2

## 2011-10-16 MED ORDER — PREDNISONE 20 MG PO TABS: 20.0000 mg | ORAL_TABLET | Freq: Every day | ORAL | Status: DC

## 2011-10-16 MED ORDER — DILTIAZEM HCL 60 MG PO TABS
60.0000 mg | ORAL_TABLET | Freq: Four times a day (QID) | ORAL | Status: DC
  Administered 2011-10-16 – 2011-10-17 (×4): 60 mg via ORAL
  Filled 2011-10-16 (×8): qty 1

## 2011-10-16 MED ORDER — DIPHENHYDRAMINE HCL 50 MG PO CAPS
50.0000 mg | ORAL_CAPSULE | ORAL | Status: AC
  Administered 2011-10-16 (×4): 50 mg via ORAL
  Filled 2011-10-16 (×4): qty 1

## 2011-10-16 MED ORDER — LABETALOL HCL 5 MG/ML IV SOLN
10.0000 mg | INTRAVENOUS | Status: DC | PRN
  Filled 2011-10-16: qty 4

## 2011-10-16 NOTE — Progress Notes (Signed)
Subjective: PT states that he uses cocaine approximately every other day. Had a previous diagnosis of HTN when incarcerated but had no follow up. Reason for presenting to ED was back pain and chest pain. Pt states that chest pain and back pain both resolved. He C/O headache with id 2/10.  Objective: Filed Vitals:   10/16/11 0645 10/16/11 0700 10/16/11 0715 10/16/11 0800  BP: 141/75  155/89 148/80  Pulse: 58 66 65 67  Temp:    98.5 F (36.9 C)  TempSrc:    Oral  Resp: 17 18 19 22   Height:      Weight:      SpO2: 99% 97% 98% 98%   Weight change: 2.2 kg (4 lb 13.6 oz)  Intake/Output Summary (Last 24 hours) at 10/16/11 0931 Last data filed at 10/16/11 0700  Gross per 24 hour  Intake 2112.58 ml  Output   1050 ml  Net 1062.58 ml    General: Alert, awake, oriented x3, in no acute distress.  HEENT: [place]/AT PEERL, EOMI Neck: Trachea midline,  no masses, no thyromegal,y no JVD, no carotid bruit OROPHARYNX:  Moist, No exudate/ erythema/lesions. Poor dentition.  Heart: Regular rate and rhythm, without murmurs, rubs, gallops, PMI non-displaced, no heaves or thrills on palpation.  Lungs: Clear to auscultation, no wheezing or rhonchi noted. No increased vocal fremitus resonant to percussion  Abdomen: Soft, nontender, nondistended, positive bowel sounds, no masses no hepatosplenomegaly noted..  Neuro: No focal neurological deficits noted cranial nerves II through XII grossly intact. DTRs 2+ bilaterally upper and lower extremities. Strength 5/5 in bilateral upper and lower extremities. Musculoskeletal: No warm swelling or erythema around joints, no spinal tenderness noted.       Lab Results:  Basename 10/16/11 0334 10/15/11 0826 10/15/11 0205  NA 131* -- 137  K 3.9 -- 2.9*  CL 96 -- 98  CO2 25 -- 26  GLUCOSE 183* -- 105*  BUN 20 -- 21  CREATININE 2.34* -- 2.29*  CALCIUM 9.6 -- 9.6  MG -- 1.9 --  PHOS -- -- --   No results found for this basename:  AST:2,ALT:2,ALKPHOS:2,BILITOT:2,PROT:2,ALBUMIN:2 in the last 72 hours No results found for this basename: LIPASE:2,AMYLASE:2 in the last 72 hours  Basename 10/16/11 0334 10/15/11 0205  WBC 12.2* 8.8  NEUTROABS -- --  HGB 13.7 14.9  HCT 39.9 43.6  MCV 94.3 94.6  PLT 178 166    Basename 10/16/11 10/15/11 1806 10/15/11 0826  CKTOTAL 243* 293* 377*  CKMB 3.9 5.0* 6.5*  CKMBINDEX -- -- --  TROPONINI <0.30 <0.30 <0.30   No components found with this basename: POCBNP:3 No results found for this basename: DDIMER:2 in the last 72 hours No results found for this basename: HGBA1C:2 in the last 72 hours No results found for this basename: CHOL:2,HDL:2,LDLCALC:2,TRIG:2,CHOLHDL:2,LDLDIRECT:2 in the last 72 hours No results found for this basename: TSH,T4TOTAL,FREET3,T3FREE,THYROIDAB in the last 72 hours No results found for this basename: VITAMINB12:2,FOLATE:2,FERRITIN:2,TIBC:2,IRON:2,RETICCTPCT:2 in the last 72 hours  Micro Results: Recent Results (from the past 240 hour(s))  MRSA PCR SCREENING     Status: Normal   Collection Time   10/15/11  6:36 AM      Component Value Range Status Comment   MRSA by PCR NEGATIVE  NEGATIVE  Final     Studies/Results: Chest Portable 1 View  10/15/2011  *RADIOLOGY REPORT*  Clinical Data: Upper back pain.  PORTABLE CHEST - 1 VIEW  Comparison: Chest radiograph performed 01/30/2010  Findings: The lungs are well-aerated and clear.  There  is no evidence of focal opacification, pleural effusion or pneumothorax.  The cardiomediastinal silhouette is within normal limits.  No acute osseous abnormalities are seen.  IMPRESSION: No acute cardiopulmonary process seen.  Original Report Authenticated By: Santa Lighter, M.D.    Medications: I have reviewed the patient's current medications. Scheduled Meds:   . diphenhydrAMINE  50 mg Oral Custom  . diphenhydrAMINE  25 mg Intravenous Once  . docusate sodium  100 mg Oral BID  . famotidine (PEPCID) IV  20 mg Intravenous  Once  . famotidine  20 mg Oral BID  . methylPREDNISolone (SOLU-MEDROL) injection  125 mg Intravenous Q6H  . mulitivitamin with minerals  1 tablet Oral Daily  . potassium chloride  40 mEq Oral Q4H  . predniSONE  40 mg Oral Q breakfast  . senna  1 tablet Oral BID  . sodium chloride  3 mL Intravenous Q12H  . DISCONTD: predniSONE  20 mg Oral Q breakfast   Continuous Infusions:   . labetalol (NORMODYNE) infusion 0.007 mg/min (10/16/11 0700)  . nitroGLYCERIN 5 mcg/min (10/16/11 0700)   PRN Meds:.acetaminophen, acetaminophen, diphenhydrAMINE, labetalol, morphine, ondansetron (ZOFRAN) IV, ondansetron, DISCONTD: diphenhydrAMINE Assessment/Plan: Patient Active Hospital Problem List: Chest pain (10/15/2011)   Assessment: resolved. NTG off st this time.    HTN (hypertension), malignant (05/20/2011)   Assessment: Will D/C B-blocker in light of pt's habitual use of cocaine. Start on CCB and Diuretic.    Hypokalemia (05/20/2011)   Assessment: repleted.    Abnormal EKG (05/20/2011)   Assessment: Pt has some  Mild S-T depression and T-wave inversion but is asymptomatic and without any change in cardiac enzymes. I suspect that this is in part due to LVH in combination with vasoconstriction associated with cocaine use. Recommend abstinence from cocaine, continued ASA,  and BP control.   Polysubstance abuse ()   Assessment: SW to see for counselling    CKD (chronic kidney disease) (10/15/2011)   Assessment: Cr. Stable and would likely benefit from ACE inhibitor, however in light of the possible angioedema reaction to NSAIDS or maybe cocaine this would be a risk with his patient. B-Blockers relatively contraindicated in habitual use of Cocaine. Will continue CCB.    Back pain (10/15/2011)   Assessment: Resolved    Jaw pain (10/15/2011)   Assessment: resolved.     Headache (10/16/2011)   Assessment: Likely due to  NTG. Improved will continue to monitor.   LOS: 1 day

## 2011-10-16 NOTE — Plan of Care (Signed)
Problem: Phase I Progression Outcomes Goal: Pain controlled with appropriate interventions Outcome: Progressing Denies pain. Goal: OOB as tolerated unless otherwise ordered Outcome: Progressing Up to bathroom with observation.  Steady gait.  Goal: Initial discharge plan identified Outcome: Progressing Social work following patient.

## 2011-10-16 NOTE — Progress Notes (Signed)
Clinical Social Work Department BRIEF PSYCHOSOCIAL ASSESSMENT 10/16/2011  Patient:  Corey Guerrero, Corey Guerrero     Account Number:  [id number]     Admit date:  10/15/2011  Clinical Social Worker:  Armstead Peaks, Latanya Presser  Date/Time:  10/16/2011 12:00 M  Referred by:  Physician  Date Referred:  10/15/2011 Referred for  Substance Abuse   Other Referral:   Interview type:  Patient Other interview type:   CHART REVIEW, DISCUSSION WITH RN    PSYCHOSOCIAL DATA Living Status:  FRIEND(S) Admitted from facility:   Level of care:   Primary support name:  EARL BURGER Primary support relationship to patient:  FRIEND Degree of support available:   limited    CURRENT CONCERNS Current Concerns  Substance Abuse   Other Concerns:   Family stressors, but would not disclose specifics at time of interview.    SOCIAL WORK ASSESSMENT / PLAN Pt open to meeting with CSW. Pt was here last fall and went to [facility] for sub. abuse treatment on that d/c. Pt stated that things were going well until he relapsed. Pt would not disclose when he first began this most recent relapse, but has been using cocaine daily most recently. CSW inquired if Pt has had a recent stressor that may have led to his relapse. He responded that he "was the one that relapsed not his stress". Pt eluded to stress from his family, but was not ready to discuss this with CSW this am. CSW discussed with Pt that if we could address some of these stressors that we might be able to come up with some positive coping mechanisms. Pt agreed and will discuss further later, as he became sleepy. Pt would like to try inpatient treatment again for his substance abuse. He is open to any open inpt. treatment option currently.   Assessment/plan status:  Referral to [employer] Other assessment/ plan:   Assist with inpt treatment as requested from Pt.  Follow up on current stressors/family issues when Pt ready to discuss  Pt may need psych consult to  assist with placement at inpt. rehab. CSW to share case with psych CSW.   Information/referral to community resources:   [facility] and other inpt treatment options.    PATIENT'S/FAMILY'S RESPONSE TO PLAN OF CARE: Pt very agreeable and interested in treatment currently. CSW will follow and assist with possible inpt treatment to address drug abuse.   Lanell Matar [facility] [place] 10/16/2011 10:28 AM [phone]
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
## 2011-10-16 NOTE — Progress Notes (Signed)
RN reports pt c/o swelling to tongue and inside of his mouth. States he feels like it is hard to swallow. Denies having this happen before. Pt noted w/ mild to mod swelling to his tongue and lips (R>L) as well as bil buccal MM. Pt w/o redness, rash,  SOB or difficulty breathing. BBS CTA. Denies itching. Noted 2 small approx 1-2 cm enlarged submandibular lymph nodes bil. Pt treated for angioedema. IV Solumedrol, IV Pepcid and IV Benadryl given. Upon re-eval pt's swelling has much improved. Pt now states he no longer has sensation of difficulty swallowing. Will continue Pepcid, Benadryl and Prednisone x 5 days. Pt's diabetes considered w/ regard to Prednisone therapy but feel benefit outweighs risks. Will monitor CBG's closely. Discussed plan w/ Dr Quay Burow who is agreeable.

## 2011-10-16 NOTE — Progress Notes (Signed)
CSW spoke with Pt again at his request. Pt disclosed multiple stressors regarding work and family. 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 He helps them get to appointments and also checks on them per his report. He has coped with these stressors by using cocaine. 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 As a result of these stressors, he feels he cannot attend inpt. Rehab at this time. He is agreeable to following up with NA meetings on discharge and states he is aware of where to attend. Pt did say he will think about his options overnight.  CSW had requested psych consult to MD to assist with inpt rehab. Now not warranted, as Pt has changed his mind. If he is interested in inpt. treatment a psych consult may still be needed, as some facilities do now require this documentation to admit.  CSW will follow and reassess in the am. Armstead Peaks, Latanya Presser 10/16/2011 12:09 PM [id number]

## 2011-10-17 LAB — BASIC METABOLIC PANEL
BUN: 25 mg/dL — ABNORMAL HIGH (ref 6–23)
CO2: 26 mEq/L (ref 19–32)
Calcium: 9.9 mg/dL (ref 8.4–10.5)
Chloride: 101 mEq/L (ref 96–112)
Creatinine, Ser: 2.3 mg/dL — ABNORMAL HIGH (ref 0.50–1.35)
GFR calc Af Amer: 35 mL/min — ABNORMAL LOW (ref >90)
GFR calc non Af Amer: 30 mL/min — ABNORMAL LOW (ref >90)
Glucose, Bld: 131 mg/dL — ABNORMAL HIGH (ref 70–99)
Potassium: 4.6 mEq/L (ref 3.5–5.1)
Sodium: 134 mEq/L — ABNORMAL LOW (ref 135–145)

## 2011-10-17 MED ORDER — ADULT MULTIVITAMIN W/MINERALS CH: 1.0000 | ORAL_TABLET | Freq: Every day | ORAL | Status: DC

## 2011-10-17 MED ORDER — AMLODIPINE BESYLATE 5 MG PO TABS: 5.0000 mg | ORAL_TABLET | Freq: Every day | ORAL | Status: DC

## 2011-10-17 MED ORDER — HYDROCHLOROTHIAZIDE 25 MG PO TABS: 25.0000 mg | ORAL_TABLET | Freq: Every day | ORAL | Status: DC

## 2011-10-17 NOTE — Discharge Summary (Signed)
Corey Guerrero MRN: [id number] DOB/AGE: [birth year] 56 y.o.  Admit date: 10/15/2011 Discharge date: 10/17/2011  Primary Care Physician:  No primary provider on file.   Discharge Diagnoses:   Patient Active Problem List  Diagnoses  . HTN (hypertension), malignant  . Hypokalemia  . Abnormal EKG  . Polysubstance abuse  . Renal failure (ARF), acute on chronic  . CKD (chronic kidney disease)  . Chest pain  . Back pain  . Jaw pain    DISCHARGE MEDICATION: Medication List  As of 10/17/2011  9:15 AM   STOP taking these medications         ibuprofen 200 MG tablet         TAKE these medications         amLODipine 5 MG tablet   Commonly known as: NORVASC   Take 1 tablet (5 mg total) by mouth daily.      hydrochlorothiazide 25 MG tablet   Commonly known as: HYDRODIURIL   Take 1 tablet (25 mg total) by mouth daily.      mulitivitamin with minerals Tabs   Take 1 tablet by mouth daily.              Consults:     SIGNIFICANT DIAGNOSTIC STUDIES:  Ct Soft Tissue Neck Wo Contrast  10/16/2011  *RADIOLOGY REPORT*  Clinical Data: Dysphagia and left-sided neck swelling.  CT NECK WITHOUT CONTRAST  Technique:  Multidetector CT imaging of the neck was performed without intravenous contrast.  Comparison: Neck CT without contrast 11/22/2009.  Findings: Diffuse edematous changes are present within the left side of the neck.  There is stranding of the left parapharyngeal fat.  The fat planes in the left sublingual space are indistinct. There are edematous changes surrounding the carotid sheath.  There is slight asymmetry of the left palatine tonsillar tissue. No discrete abscess is evident.  Edematous changes extend into the left vallecula.  The vocal cords are symmetric in midline.  Reactive type level II cervical adenopathy is present bilaterally.  The moderate degenerative endplate changes from 075-GRM through C6-7 are similar to the prior exam.  No focal lytic or blastic lesions are present.   The patient is edentulous and maxilla and has only six remaining mandibular teeth.  The mandible is located.  IMPRESSION:  1.  Indistinct edematous changes throughout the left side of neck extending into the sublingual space and base of tongue.  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 2.  Bilateral level II adenopathy appears reactive.  There is no significant difference from left to right. 3.  Mild soft tissue prominence within the palatine tonsil area on the left.  No discrete mass or abscess is evident. 4.  Moderate spondylosis in the cervical spine.  Original Report Authenticated By: Resa Miner. MATTERN, M.D.   Chest Portable 1 View  10/15/2011  *RADIOLOGY REPORT*  Clinical Data: Upper back pain.  PORTABLE CHEST - 1 VIEW  Comparison: Chest radiograph performed 01/30/2010  Findings: The lungs are well-aerated and clear.  There is no evidence of focal opacification, pleural effusion or pneumothorax.  The cardiomediastinal silhouette is within normal limits.  No acute osseous abnormalities are seen.  IMPRESSION: No acute cardiopulmonary process seen.  Original Report Authenticated By: Santa Lighter, M.D.        Recent Results (from the past 240 hour(s))  MRSA PCR SCREENING     Status: Normal   Collection Time   10/15/11  6:36 AM      Component  Value Range Status Comment   MRSA by PCR NEGATIVE  NEGATIVE  Final     BRIEF ADMITTING H & P: 56yoM with h/o cocaine and alcohol abuse, at least two prior admissions for malignant HTN  and chest pain in the setting of cocaine abuse presents for the same.  Tonight, he came to the ED complaining of substernal chest pain and told the ED that he  had done cocaine earlier tonight while drinking some beers, and incidentally noted some  left sided jaw pain and possibly with a swollen lymph node under his jaw. However, by the  time of my interview the patient was sleeping, was arousable but sleepy and inattentive  to conversation  and my interview, and basically not reliable, unable to get history. His  complaint was back pain but denied any injury, which by my discussion with ED staff he  did not mention previously the ED pt had marked HTN up to 249/167. Labs with hypoK 2.9, and renal 21/2.29. Total  CK 423 with MB pending, Trop negative, BNP 4642. CBC normal. Utox positive for cocaine.  CXR negative. Admission requested for chest pain, severe HTN. Started on nitroglycerin  driop and given ativan 1mg  x2 and ASA 325.     Hospital Course:  Present on Admission:  .HTN (hypertension), malignant: The patient had excessively high BP's on admission. He was placed on a labetalol infusion and transitioned to oral Calcium Channel Blocker. The patient was also started on HCTZ. The patient had a dramatic decrease in BP which I feel is more likely secondary to the washout of Cocaine from his system. The patient does have some essential HTN which I will treat with HCTZ and Norvasc 5 MG. I have chosen Norvasc rather then  ACE-I for  renoprotection in light of the fact that the patient had an angioedema like reaction to an unknown entity. I recommend avoiding B-blockers as patient has reported period habitual use of cocaine when in high stress situations.  .Abnormal EKG: Pt had S-T depression and T-wave inversion in several lateral leads without any increase in cardiac enzymes. This is felt to be secondary to effect of cocaine effect and LVH. The EKG has essentially normalized at the time of D/C except for the findings consistent with LVH.   Marland KitchenHypokalemia: Repleted  .Polysubstance abuse: Pt received counsel from SW and will follow up with NA  .CKD (chronic kidney disease): Cr. Stable  .Chest pain: resolved  .Angioedema: Pt had reported lip swelling. He was treated with steroids and PPI. He had a complete resolution of symptoms within 12 hours.   Disposition and Follow-up:  Pt to follow up with [facility].  DISCHARGE  EXAM:  General: Alert, awake, oriented x3. States that he feels great and is up ambulating.  Vital Signs:Blood pressure 152/96, pulse 61, temperature 97.4 F (36.3 C), temperature source Oral, resp. rate 20, height 5\' 6"  (1.676 m), weight 69.2 kg (152 lb 8.9 oz), SpO2 95.00%. HEENT: [place]/AT PEERL, EOMI  Neck: Trachea midline, no masses, no thyromegal,y no JVD, no carotid bruit  OROPHARYNX: Moist, No exudate/ erythema/lesions. Poor dentition.  Heart: Regular rate and rhythm, without murmurs, rubs, gallops, PMI non-displaced, no heaves or thrills on palpation.  Lungs: Clear to auscultation, no wheezing or rhonchi noted. No increased vocal fremitus resonant to percussion  Abdomen: Soft, nontender, nondistended, positive bowel sounds, no masses no hepatosplenomegaly noted..  Neuro: No focal neurological deficits noted cranial nerves II through XII grossly intact. DTRs 2+ bilaterally upper and lower  extremities. Strength 5/5 in bilateral upper and lower extremities.  Musculoskeletal: No warm swelling or erythema around joints, no spinal tenderness noted.     Basename 10/17/11 0335 10/16/11 0334 10/15/11 0826  NA 134* 131* --  K 4.6 3.9 --  CL 101 96 --  CO2 26 25 --  GLUCOSE 131* 183* --  BUN 25* 20 --  CREATININE 2.30* 2.34* --  CALCIUM 9.9 9.6 --  MG -- -- 1.9  PHOS -- -- --   No results found for this basename: AST:2,ALT:2,ALKPHOS:2,BILITOT:2,PROT:2,ALBUMIN:2 in the last 72 hours No results found for this basename: LIPASE:2,AMYLASE:2 in the last 72 hours  Basename 10/16/11 0334 10/15/11 0205  WBC 12.2* 8.8  NEUTROABS -- --  HGB 13.7 14.9  HCT 39.9 43.6  MCV 94.3 94.6  PLT 178 166   Total Time for discharge process including face to face time approximately 40 minutes.  Signed: Shadoe Cryan A. 10/17/2011, 9:15 AM

## 2011-11-12 ENCOUNTER — Emergency Department (HOSPITAL_COMMUNITY)
Admission: EM | Admit: 2011-11-12 | Discharge: 2011-11-12 | Disposition: A | Discharge status: Home / Self Care | Payer: No Typology Code available for payment source | Attending: Emergency Medicine | Admitting: Emergency Medicine

## 2011-11-12 ENCOUNTER — Emergency Department (HOSPITAL_COMMUNITY): Payer: Self-pay

## 2011-11-12 ENCOUNTER — Encounter (HOSPITAL_COMMUNITY): Payer: Self-pay | Admitting: *Deleted

## 2011-11-12 DIAGNOSIS — R109 Unspecified abdominal pain: Secondary | ICD-10-CM | POA: Insufficient documentation

## 2011-11-12 DIAGNOSIS — E876 Hypokalemia: Secondary | ICD-10-CM | POA: Insufficient documentation

## 2011-11-12 DIAGNOSIS — F1010 Alcohol abuse, uncomplicated: Secondary | ICD-10-CM | POA: Insufficient documentation

## 2011-11-12 DIAGNOSIS — Y92410 Unspecified street and highway as the place of occurrence of the external cause: Secondary | ICD-10-CM | POA: Insufficient documentation

## 2011-11-12 DIAGNOSIS — I10 Essential (primary) hypertension: Secondary | ICD-10-CM | POA: Insufficient documentation

## 2011-11-12 DIAGNOSIS — F1910 Other psychoactive substance abuse, uncomplicated: Secondary | ICD-10-CM | POA: Insufficient documentation

## 2011-11-12 DIAGNOSIS — M25519 Pain in unspecified shoulder: Secondary | ICD-10-CM | POA: Insufficient documentation

## 2011-11-12 LAB — POCT I-STAT, CHEM 8
BUN: 27 mg/dL — ABNORMAL HIGH (ref 6–23)
Calcium, Ion: 1.16 mmol/L (ref 1.12–1.32)
Chloride: 103 mEq/L (ref 96–112)
Creatinine, Ser: 2.6 mg/dL — ABNORMAL HIGH (ref 0.50–1.35)
Glucose, Bld: 98 mg/dL (ref 70–99)
HCT: 42 % (ref 39.0–52.0)
Hemoglobin: 14.3 g/dL (ref 13.0–17.0)
Potassium: 2.9 mEq/L — ABNORMAL LOW (ref 3.5–5.1)
Sodium: 140 mEq/L (ref 135–145)
TCO2: 27 mmol/L (ref 0–100)

## 2011-11-12 LAB — CBC
HCT: 39.1 % (ref 39.0–52.0)
Hemoglobin: 13.7 g/dL (ref 13.0–17.0)
MCH: 33 pg (ref 26.0–34.0)
MCHC: 35 g/dL (ref 30.0–36.0)
MCV: 94.2 fL (ref 78.0–100.0)
Platelets: 152 10*3/uL (ref 150–400)
RBC: 4.15 MIL/uL — ABNORMAL LOW (ref 4.22–5.81)
RDW: 12.4 % (ref 11.5–15.5)
WBC: 4.3 10*3/uL (ref 4.0–10.5)

## 2011-11-12 LAB — COMPREHENSIVE METABOLIC PANEL
ALT: 23 U/L (ref 0–53)
AST: 33 U/L (ref 0–37)
Albumin: 3.6 g/dL (ref 3.5–5.2)
Alkaline Phosphatase: 55 U/L (ref 39–117)
BUN: 27 mg/dL — ABNORMAL HIGH (ref 6–23)
CO2: 26 mEq/L (ref 19–32)
Calcium: 9.2 mg/dL (ref 8.4–10.5)
Chloride: 100 mEq/L (ref 96–112)
Creatinine, Ser: 2.49 mg/dL — ABNORMAL HIGH (ref 0.50–1.35)
GFR calc Af Amer: 32 mL/min — ABNORMAL LOW (ref >90)
GFR calc non Af Amer: 28 mL/min — ABNORMAL LOW (ref >90)
Glucose, Bld: 99 mg/dL (ref 70–99)
Potassium: 2.9 mEq/L — ABNORMAL LOW (ref 3.5–5.1)
Sodium: 137 mEq/L (ref 135–145)
Total Bilirubin: 0.4 mg/dL (ref 0.3–1.2)
Total Protein: 7 g/dL (ref 6.0–8.3)

## 2011-11-12 LAB — URINALYSIS, MICROSCOPIC ONLY
Bilirubin Urine: NEGATIVE
Glucose, UA: NEGATIVE mg/dL
Ketones, ur: NEGATIVE mg/dL
Leukocytes, UA: NEGATIVE
Nitrite: NEGATIVE
Protein, ur: 100 mg/dL — AB
Specific Gravity, Urine: 1.016 (ref 1.005–1.030)
Urobilinogen, UA: 0.2 mg/dL (ref 0.0–1.0)
pH: 6 (ref 5.0–8.0)

## 2011-11-12 LAB — LACTIC ACID, PLASMA: Lactic Acid, Venous: 1.4 mmol/L (ref 0.5–2.2)

## 2011-11-12 LAB — SAMPLE TO BLOOD BANK

## 2011-11-12 LAB — ETHANOL: Alcohol, Ethyl (B): <11 mg/dL (ref 0–11)

## 2011-11-12 LAB — GLUCOSE, CAPILLARY: Glucose-Capillary: 108 mg/dL — ABNORMAL HIGH (ref 70–99)

## 2011-11-12 LAB — PROTIME-INR
INR: 0.92 (ref 0.00–1.49)
Prothrombin Time: 12.6 seconds (ref 11.6–15.2)

## 2011-11-12 MED ORDER — POTASSIUM CHLORIDE 10 MEQ/100ML IV SOLN: 10.0000 meq | INTRAVENOUS | Status: DC

## 2011-11-12 MED ORDER — POTASSIUM CHLORIDE 20 MEQ/15ML (10%) PO LIQD
40.0000 meq | Freq: Once | ORAL | Status: AC
  Administered 2011-11-12: 40 meq via ORAL
  Filled 2011-11-12: qty 30

## 2011-11-12 NOTE — ED Notes (Signed)
The pt denies cocaine use today.  He did take a xanax
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
## 2011-11-12 NOTE — ED Notes (Signed)
The pt is hypertensive.  He has a history of the same.  hehas not takne any bp med for 2-3 weeks.  He is sleeping soundly when he is not disturbed

## 2011-11-12 NOTE — ED Provider Notes (Signed)
I saw and evaluated the patient, reviewed the resident's note and I agree with the findings and plan.  Barbara Cower, MD 11/12/11 [phone]
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
## 2011-11-12 NOTE — ED Notes (Signed)
The pt taken to xray.

## 2011-11-12 NOTE — ED Notes (Signed)
Ed res aware of the pts hypertension.  Telling the pt to follow up with his doctor

## 2011-11-20 LAB — CDS SEROLOGY

## 2011-12-02 ENCOUNTER — Encounter (HOSPITAL_BASED_OUTPATIENT_CLINIC_OR_DEPARTMENT_OTHER): Payer: Self-pay

## 2011-12-02 ENCOUNTER — Emergency Department (HOSPITAL_BASED_OUTPATIENT_CLINIC_OR_DEPARTMENT_OTHER)
Admission: EM | Admit: 2011-12-02 | Discharge: 2011-12-02 | Disposition: A | Discharge status: Home / Self Care | Payer: Self-pay | Attending: Emergency Medicine | Admitting: Emergency Medicine

## 2011-12-02 DIAGNOSIS — I10 Essential (primary) hypertension: Secondary | ICD-10-CM

## 2011-12-02 DIAGNOSIS — N189 Chronic kidney disease, unspecified: Secondary | ICD-10-CM | POA: Insufficient documentation

## 2011-12-02 DIAGNOSIS — Z91199 Patient's noncompliance with other medical treatment and regimen due to unspecified reason: Secondary | ICD-10-CM | POA: Insufficient documentation

## 2011-12-02 DIAGNOSIS — I129 Hypertensive chronic kidney disease with stage 1 through stage 4 chronic kidney disease, or unspecified chronic kidney disease: Secondary | ICD-10-CM | POA: Insufficient documentation

## 2011-12-02 DIAGNOSIS — Z9114 Patient's other noncompliance with medication regimen: Secondary | ICD-10-CM

## 2011-12-02 DIAGNOSIS — Z9119 Patient's noncompliance with other medical treatment and regimen: Secondary | ICD-10-CM | POA: Insufficient documentation

## 2011-12-02 LAB — CBC
HCT: 40.1 % (ref 39.0–52.0)
Hemoglobin: 14.3 g/dL (ref 13.0–17.0)
MCH: 33.6 pg (ref 26.0–34.0)
MCHC: 35.7 g/dL (ref 30.0–36.0)
MCV: 94.4 fL (ref 78.0–100.0)
Platelets: 110 10*3/uL — ABNORMAL LOW (ref 150–400)
RBC: 4.25 MIL/uL (ref 4.22–5.81)
RDW: 11.9 % (ref 11.5–15.5)
WBC: 4.4 10*3/uL (ref 4.0–10.5)

## 2011-12-02 LAB — BASIC METABOLIC PANEL
BUN: 22 mg/dL (ref 6–23)
CO2: 32 mEq/L (ref 19–32)
Calcium: 9.2 mg/dL (ref 8.4–10.5)
Chloride: 99 mEq/L (ref 96–112)
Creatinine, Ser: 2.3 mg/dL — ABNORMAL HIGH (ref 0.50–1.35)
GFR calc Af Amer: 35 mL/min — ABNORMAL LOW (ref >90)
GFR calc non Af Amer: 30 mL/min — ABNORMAL LOW (ref >90)
Glucose, Bld: 99 mg/dL (ref 70–99)
Potassium: 3.2 mEq/L — ABNORMAL LOW (ref 3.5–5.1)
Sodium: 139 mEq/L (ref 135–145)

## 2011-12-02 LAB — TROPONIN I: Troponin I: <0.3 ng/mL (ref <0.30)

## 2011-12-02 MED ORDER — HYDRALAZINE HCL 20 MG/ML IJ SOLN
10.0000 mg | Freq: Once | INTRAMUSCULAR | Status: AC
  Administered 2011-12-02: 10 mg via INTRAVENOUS

## 2011-12-02 MED ORDER — HYDRALAZINE HCL 20 MG/ML IJ SOLN
10.0000 mg | Freq: Once | INTRAMUSCULAR | Status: DC
  Filled 2011-12-02: qty 1

## 2011-12-02 MED ORDER — AMLODIPINE BESYLATE 10 MG PO TABS: 10.0000 mg | ORAL_TABLET | Freq: Every day | ORAL | Status: DC

## 2011-12-02 MED ORDER — POTASSIUM CHLORIDE 20 MEQ/15ML (10%) PO LIQD
20.0000 meq | Freq: Once | ORAL | Status: AC
  Administered 2011-12-02: 20 meq via ORAL
  Filled 2011-12-02: qty 15

## 2011-12-02 MED ORDER — HYDROCHLOROTHIAZIDE 25 MG PO TABS: 25.0000 mg | ORAL_TABLET | Freq: Every day | ORAL | Status: DC

## 2011-12-02 MED ORDER — HYDRALAZINE HCL 20 MG/ML IJ SOLN
10.0000 mg | Freq: Once | INTRAMUSCULAR | Status: AC
  Administered 2011-12-02: 10 mg via INTRAVENOUS
  Filled 2011-12-02: qty 1

## 2011-12-02 NOTE — ED Notes (Signed)
Pt presents from [facility] with hypertension.  He denies any pain or symptoms and tells me he has been out of blood pressure medications x 2 weeks except for Norvasc.  Last used cocaine Saturday and is in [facility] for detox.

## 2011-12-02 NOTE — Discharge Instructions (Signed)
Arterial Hypertension Arterial hypertension (high blood pressure) is a condition of elevated pressure in your blood vessels. Hypertension over a long period of time is a risk factor for strokes, heart attacks, and heart failure. It is also the leading cause of kidney (renal) failure.  CAUSES   In Adults -- Over 90% of all hypertension has no known cause. This is called essential or primary hypertension. In the other 10% of people with hypertension, the increase in blood pressure is caused by another disorder. This is called secondary hypertension. Important causes of secondary hypertension are:   Heavy alcohol use.   Obstructive sleep apnea.   Hyperaldosterosim (Conn's syndrome).   Steroid use.   Chronic kidney failure.   Hyperparathyroidism.   Medications.   Renal artery stenosis.   Pheochromocytoma.   Cushing's disease.   Coarctation of the aorta.   Scleroderma renal crisis.   Licorice (in excessive amounts).   Drugs (cocaine, methamphetamine).  Your caregiver can explain any items above that apply to you.  In Children -- Secondary hypertension is more common and should always be considered.   Pregnancy -- Few women of childbearing age have high blood pressure. However, up to 10% of them develop hypertension of pregnancy. Generally, this will not harm the woman. It may be a sign of 3 complications of pregnancy: preeclampsia, HELLP syndrome, and eclampsia. Follow up and control with medication is necessary.  SYMPTOMS   This condition normally does not produce any noticeable symptoms. It is usually found during a routine exam.   Malignant hypertension is a late problem of high blood pressure. It may have the following symptoms:   Headaches.   Blurred vision.   End-organ damage (this means your kidneys, heart, lungs, and other organs are being damaged).   Stressful situations can increase the blood pressure. If a person with normal blood pressure has their blood  pressure go up while being seen by their caregiver, this is often termed "white coat hypertension." Its importance is not known. It may be related with eventually developing hypertension or complications of hypertension.   Hypertension is often confused with mental tension, stress, and anxiety.  DIAGNOSIS  The diagnosis is made by 3 separate blood pressure measurements. They are taken at least 1 week apart from each other. If there is organ damage from hypertension, the diagnosis may be made without repeat measurements. Hypertension is usually identified by having blood pressure readings:  Above 140/90 mmHg measured in both arms, at 3 separate times, over a couple weeks.   Over 130/80 mmHg should be considered a risk factor and may require treatment in patients with diabetes.  Blood pressure readings over 120/80 mmHg are called "pre-hypertension" even in non-diabetic patients. To get a true blood pressure measurement, use the following guidelines. Be aware of the factors that can alter blood pressure readings.  Take measurements at least 1 hour after caffeine.   Take measurements 30 minutes after smoking and without any stress. This is another reason to quit smoking - it raises your blood pressure.   Use a proper cuff size. Ask your caregiver if you are not sure about your cuff size.   Most home blood pressure cuffs are automatic. They will measure systolic and diastolic pressures. The systolic pressure is the pressure reading at the start of sounds. Diastolic pressure is the pressure at which the sounds disappear. If you are elderly, measure pressures in multiple postures. Try sitting, lying or standing.   Sit at rest for a minimum of   5 minutes before taking measurements.   You should not be on any medications like decongestants. These are found in many cold medications.   Record your blood pressure readings and review them with your caregiver.  If you have hypertension:  Your caregiver  may do tests to be sure you do not have secondary hypertension (see "causes" above).   Your caregiver may also look for signs of metabolic syndrome. This is also called Syndrome X or Insulin Resistance Syndrome. You may have this syndrome if you have type 2 diabetes, abdominal obesity, and abnormal blood lipids in addition to hypertension.   Your caregiver will take your medical and family history and perform a physical exam.   Diagnostic tests may include blood tests (for glucose, cholesterol, potassium, and kidney function), a urinalysis, or an EKG. Other tests may also be necessary depending on your condition.  PREVENTION  There are important lifestyle issues that you can adopt to reduce your chance of developing hypertension:  Maintain a normal weight.   Limit the amount of salt (sodium) in your diet.   Exercise often.   Limit alcohol intake.   Get enough potassium in your diet. Discuss specific advice with your caregiver.   Follow a DASH diet (dietary approaches to stop hypertension). This diet is rich in fruits, vegetables, and low-fat dairy products, and avoids certain fats.  PROGNOSIS  Essential hypertension cannot be cured. Lifestyle changes and medical treatment can lower blood pressure and reduce complications. The prognosis of secondary hypertension depends on the underlying cause. Many people whose hypertension is controlled with medicine or lifestyle changes can live a normal, healthy life.  RISKS AND COMPLICATIONS  While high blood pressure alone is not an illness, it often requires treatment due to its short- and long-term effects on many organs. Hypertension increases your risk for:  CVAs or strokes (cerebrovascular accident).   Heart failure due to chronically high blood pressure (hypertensive cardiomyopathy).   Heart attack (myocardial infarction).   Damage to the retina (hypertensive retinopathy).   Kidney failure (hypertensive nephropathy).  Your caregiver can  explain list items above that apply to you. Treatment of hypertension can significantly reduce the risk of complications. TREATMENT   For overweight patients, weight loss and regular exercise are recommended. Physical fitness lowers blood pressure.   Mild hypertension is usually treated with diet and exercise. A diet rich in fruits and vegetables, fat-free dairy products, and foods low in fat and salt (sodium) can help lower blood pressure. Decreasing salt intake decreases blood pressure in a 1/3 of people.   Stop smoking if you are a smoker.  The steps above are highly effective in reducing blood pressure. While these actions are easy to suggest, they are difficult to achieve. Most patients with moderate or severe hypertension end up requiring medications to bring their blood pressure down to a normal level. There are several classes of medications for treatment. Blood pressure pills (antihypertensives) will lower blood pressure by their different actions. Lowering the blood pressure by 10 mmHg may decrease the risk of complications by as much as 25%. The goal of treatment is effective blood pressure control. This will reduce your risk for complications. Your caregiver will help you determine the best treatment for you according to your lifestyle. What is excellent treatment for one person, may not be for you. HOME CARE INSTRUCTIONS   Do not smoke.   Follow the lifestyle changes outlined in the "Prevention" section.   If you are on medications, follow the directions   carefully. Blood pressure medications must be taken as prescribed. Skipping doses reduces their benefit. It also puts you at risk for problems.   Follow up with your caregiver, as directed.   If you are asked to monitor your blood pressure at home, follow the guidelines in the "Diagnosis" section above.  SEEK MEDICAL CARE IF:   You think you are having medication side effects.   You have recurrent headaches or lightheadedness.     You have swelling in your ankles.   You have trouble with your vision.  SEEK IMMEDIATE MEDICAL CARE IF:   You have sudden onset of chest pain or pressure, difficulty breathing, or other symptoms of a heart attack.   You have a severe headache.   You have symptoms of a stroke (such as sudden weakness, difficulty speaking, difficulty walking).  MAKE SURE YOU:   Understand these instructions.   Will watch your condition.   Will get help right away if you are not doing well or get worse.  Document Released: 06/17/2005 Document Revised: 06/06/2011 Document Reviewed: 01/15/2007 [facility] Patient Information 2012 [place].  Chronic Renal Insufficiency Chronic renal insufficiency (also called kidney failure) occurs when there is kidney damage done. The damage prevents the kidneys from working like they should.  The kidneys do many important things. They:  Filter waste out of the blood.   Regulate the amount of water and various salts in the blood stream.   Produce chemicals that:   Prompt the bone marrow to make red blood cells.   Regulate blood pressure.   Keep calcium in balance throughout the bones and the body.  When the kidneys are damaged, they can no longer filter waste products out of the blood. These substances build up in the blood, causing illness.  CAUSES   Diabetes.   High blood pressure.   Glomerular diseases: Conditions that damage the tiny blood vessels (glomeruli) within the kidneys, such as:   Membranous nephropathy.   IgA nephropathy.   Focal segmental glomerulosclerosis.   Poisons (such as overdoses or misuse of acetaminophen or NSAIDS, or exposure to other toxic substances).   Kidney injuries.   Kidney cancer or cancer that spreads to the kidney.   Medications such as NSAIDs. These problems are rare.   Kidney stones.   Alport disease.   Polycystic kidneys.  SYMPTOMS  Most people do not notice symptoms of kidney failure until their  kidney function drops below about 30-40% of normal. Symptoms can include:  Weakness.   Tiredness.   Frequent urination.   Intense need to urinate.   Excess bruising.   Low urine production.   Blood in the urine.   Pain in the kidney area.   Feeling sick to your stomach (nausea).   Vomiting.   Unusual bleeding.   Numbness in hands and feet.   Swelling in legs, arms and face.   Confusion.  DIAGNOSIS  Your caregiver will look for signs of kidney failure. Tests to diagnose kidney failure may include:  Urine tests: May reveal the presence of blood, protein or sugar.   Blood tests: May show low red blood cell count (anemia) or high levels of waste products (BUN and creatinine) that are normally filtered out of the bloodstream by the kidneys.   Imaging tests - These are tests that create pictures of the organs inside the abdomen, such as the kidneys. They may reveal masses growing in the kidneys or blockages to the flow of urine. Possible imaging tests may include:  Ultrasound.   CT scan.   MRI.   Intravenous pyelogram or IVP. This is a test that involves injecting dye into the bloodstream and then taking a series of x-rays of the kidneys. This allows the kidneys and other parts of the urinary system to be viewed more clearly.   Kidney biopsy - A small sample of kidney is removed using a special needle. The sample is examined for abnormalities under a microscope.  TREATMENT  Chronic kidney failure cannot usually be cured. The various symptoms are treated, and measures are taken to avoid further kidney damage. Treatment for mild to moderate kidney failure may include:  Medication for high blood pressure.   Good control of diabetes.   Medication and diet change to improve anemia.   A low-sodium, low-potassium, low-protein and/or low-cholesterol diet.   Limiting the quantity of liquids in the diet.  Treatment for more severe kidney failure may require:  Dialysis -  Mechanical methods of filtering the blood.   Kidney transplant - An operation that removes the diseased kidney and replaces it with a donated kidney.  HOME CARE INSTRUCTIONS   Take medication as told by your caregiver.   Quit smoking if you are a smoker. Talk to your caregiver about a smoking cessation program.   Follow your prescribed diet.   If you are prescribed vitamins, take them as told.  SEEK IMMEDIATE MEDICAL CARE IF:  You start to produce less urine.   You notice blood in your urine.   You have increased pain.   You have increased weakness, fatigue or confusion.   You notice new swelling.   You develop a fever.   You feel that you are having side effects of medicines prescribed.  Document Released: 03/26/2008 Document Revised: 06/06/2011 Document Reviewed: 07/09/2010 [facility] Patient Information 2012 [place].  RESOURCE GUIDE  Dental Problems  Patients with Medicaid: [place] Phone:  [phone]                                                   Phone:  [phone]  If unable to pay or uninsured, contact:  [facility] or [facility]. to become qualified for the adult dental clinic.  Chronic Pain Problems Contact [facility]  [phone] Patients need to be referred by their primary care doctor.  Insufficient Money for Medicine Contact [employer]:  call "211" or [place] [phone].  No Primary Care Doctor Call [facility]  [phone] Other agencies that provide inexpensive medical care    [place]  [phone]    [facility]  [place]  [phone]    [facility]  [phone]    [facility]  [place]  [place]   [phone] [place]   [phone] (emergency services [phone])  Abuse/Neglect [place] [phone] [place] [phone] (After Hours)  Emergency [place] [phone]  [place] [facility] [place] [phone] [place] [phone]  MRSA Hotline #:   985-102-1903    [facility] Dept. [address]         [place] Phone:  [phone]                                  Phone:  [phone]                   Phone:  [place] Phone:  [place] [phone] (After Hours)
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
## 2011-12-02 NOTE — ED Notes (Signed)
MD at bedside. 

## 2011-12-02 NOTE — ED Notes (Signed)
Pt reports hypertension and has been out of medications x 2 weeks.

## 2011-12-02 NOTE — ED Notes (Signed)
Report received from [place], [place], care assumed.

## 2011-12-13 ENCOUNTER — Encounter (HOSPITAL_BASED_OUTPATIENT_CLINIC_OR_DEPARTMENT_OTHER): Payer: Self-pay | Admitting: *Deleted

## 2011-12-13 ENCOUNTER — Emergency Department (HOSPITAL_BASED_OUTPATIENT_CLINIC_OR_DEPARTMENT_OTHER)
Admission: EM | Admit: 2011-12-13 | Discharge: 2011-12-13 | Disposition: A | Discharge status: Home / Self Care | Payer: Self-pay | Attending: Emergency Medicine | Admitting: Emergency Medicine

## 2011-12-13 DIAGNOSIS — Z7982 Long term (current) use of aspirin: Secondary | ICD-10-CM | POA: Insufficient documentation

## 2011-12-13 DIAGNOSIS — K0889 Other specified disorders of teeth and supporting structures: Secondary | ICD-10-CM

## 2011-12-13 DIAGNOSIS — Z79899 Other long term (current) drug therapy: Secondary | ICD-10-CM | POA: Insufficient documentation

## 2011-12-13 DIAGNOSIS — X58XXXA Exposure to other specified factors, initial encounter: Secondary | ICD-10-CM | POA: Insufficient documentation

## 2011-12-13 DIAGNOSIS — S025XXA Fracture of tooth (traumatic), initial encounter for closed fracture: Secondary | ICD-10-CM | POA: Insufficient documentation

## 2011-12-13 DIAGNOSIS — K029 Dental caries, unspecified: Secondary | ICD-10-CM | POA: Insufficient documentation

## 2011-12-13 DIAGNOSIS — I10 Essential (primary) hypertension: Secondary | ICD-10-CM | POA: Insufficient documentation

## 2011-12-13 HISTORY — DX: Gastro-esophageal reflux disease without esophagitis: K21.9

## 2011-12-13 MED ORDER — PENICILLIN V POTASSIUM 250 MG PO TABS: 250.0000 mg | ORAL_TABLET | Freq: Four times a day (QID) | ORAL | Status: AC

## 2011-12-13 NOTE — ED Provider Notes (Signed)
Medical screening examination/treatment/procedure(s) were performed by non-physician practitioner and as supervising physician I was immediately available for consultation/collaboration.   Delora Fuel, MD [phone] [id number]

## 2011-12-13 NOTE — ED Notes (Signed)
Patient states he has a broken tooth in the front lower jaw and left lower jaw filling removed. Developed pain in teeth several days ago.
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
## 2011-12-13 NOTE — Discharge Instructions (Signed)
Dental Pain A tooth ache may be caused by cavities (tooth decay). Cavities expose the nerve of the tooth to air and hot or cold temperatures. It may come from an infection or abscess (also called a boil or furuncle) around your tooth. It is also often caused by dental caries (tooth decay). This causes the pain you are having. DIAGNOSIS  Your caregiver can diagnose this problem by exam. TREATMENT   If caused by an infection, it may be treated with medications which kill germs (antibiotics) and pain medications as prescribed by your caregiver. Take medications as directed.   Only take over-the-counter or prescription medicines for pain, discomfort, or fever as directed by your caregiver.   Whether the tooth ache today is caused by infection or dental disease, you should see your dentist as soon as possible for further care.  SEEK MEDICAL CARE IF: The exam and treatment you received today has been provided on an emergency basis only. This is not a substitute for complete medical or dental care. If your problem worsens or new problems (symptoms) appear, and you are unable to meet with your dentist, call or return to this location. SEEK IMMEDIATE MEDICAL CARE IF:   You have a fever.   You develop redness and swelling of your face, jaw, or neck.   You are unable to open your mouth.   You have severe pain uncontrolled by pain medicine.  MAKE SURE YOU:   Understand these instructions.   Will watch your condition.   Will get help right away if you are not doing well or get worse.  Document Released: 06/17/2005 Document Revised: 06/06/2011 Document Reviewed: 02/03/2008 [facility] Patient Information 2012 [place].

## 2011-12-22 ENCOUNTER — Encounter (HOSPITAL_BASED_OUTPATIENT_CLINIC_OR_DEPARTMENT_OTHER): Payer: Self-pay | Admitting: *Deleted

## 2011-12-22 ENCOUNTER — Emergency Department (HOSPITAL_BASED_OUTPATIENT_CLINIC_OR_DEPARTMENT_OTHER)
Admission: EM | Admit: 2011-12-22 | Discharge: 2011-12-23 | Disposition: A | Discharge status: Home / Self Care | Payer: Self-pay | Attending: Emergency Medicine | Admitting: Emergency Medicine

## 2011-12-22 DIAGNOSIS — R51 Headache: Secondary | ICD-10-CM | POA: Insufficient documentation

## 2011-12-22 DIAGNOSIS — I10 Essential (primary) hypertension: Secondary | ICD-10-CM | POA: Insufficient documentation

## 2011-12-22 DIAGNOSIS — R1013 Epigastric pain: Secondary | ICD-10-CM | POA: Insufficient documentation

## 2011-12-22 DIAGNOSIS — Z79899 Other long term (current) drug therapy: Secondary | ICD-10-CM | POA: Insufficient documentation

## 2011-12-22 DIAGNOSIS — R0602 Shortness of breath: Secondary | ICD-10-CM | POA: Insufficient documentation

## 2011-12-22 LAB — DIFFERENTIAL
Basophils Absolute: 0 10*3/uL (ref 0.0–0.1)
Basophils Relative: 0 % (ref 0–1)
Eosinophils Absolute: 0.1 10*3/uL (ref 0.0–0.7)
Eosinophils Relative: 2 % (ref 0–5)
Lymphocytes Relative: 31 % (ref 12–46)
Lymphs Abs: 1.7 10*3/uL (ref 0.7–4.0)
Monocytes Absolute: 0.6 10*3/uL (ref 0.1–1.0)
Monocytes Relative: 10 % (ref 3–12)
Neutro Abs: 3.1 10*3/uL (ref 1.7–7.7)
Neutrophils Relative %: 57 % (ref 43–77)

## 2011-12-22 LAB — COMPREHENSIVE METABOLIC PANEL
ALT: 39 U/L (ref 0–53)
AST: 37 U/L (ref 0–37)
Albumin: 4.1 g/dL (ref 3.5–5.2)
Alkaline Phosphatase: 68 U/L (ref 39–117)
BUN: 28 mg/dL — ABNORMAL HIGH (ref 6–23)
CO2: 30 mEq/L (ref 19–32)
Calcium: 9.6 mg/dL (ref 8.4–10.5)
Chloride: 99 mEq/L (ref 96–112)
Creatinine, Ser: 2 mg/dL — ABNORMAL HIGH (ref 0.50–1.35)
GFR calc Af Amer: 42 mL/min — ABNORMAL LOW (ref >90)
GFR calc non Af Amer: 36 mL/min — ABNORMAL LOW (ref >90)
Glucose, Bld: 120 mg/dL — ABNORMAL HIGH (ref 70–99)
Potassium: 3.1 mEq/L — ABNORMAL LOW (ref 3.5–5.1)
Sodium: 139 mEq/L (ref 135–145)
Total Bilirubin: 0.3 mg/dL (ref 0.3–1.2)
Total Protein: 7.7 g/dL (ref 6.0–8.3)

## 2011-12-22 LAB — CBC
HCT: 38.9 % — ABNORMAL LOW (ref 39.0–52.0)
Hemoglobin: 13.6 g/dL (ref 13.0–17.0)
MCH: 32.6 pg (ref 26.0–34.0)
MCHC: 35 g/dL (ref 30.0–36.0)
MCV: 93.3 fL (ref 78.0–100.0)
Platelets: 174 10*3/uL (ref 150–400)
RBC: 4.17 MIL/uL — ABNORMAL LOW (ref 4.22–5.81)
RDW: 11.6 % (ref 11.5–15.5)
WBC: 5.5 10*3/uL (ref 4.0–10.5)

## 2011-12-22 LAB — LIPASE, BLOOD: Lipase: 28 U/L (ref 11–59)

## 2011-12-22 LAB — TROPONIN I: Troponin I: <0.3 ng/mL (ref <0.30)

## 2011-12-22 NOTE — ED Notes (Signed)
Patient states that earlier today he had a "sharp pain" in his left chest, right chest, LLQ, and RLQ. States that he had a headache and was feeling warm. Stepped out to get some air while at his [place] meeting and was told he should come in to be checked out. He is currently a patient at [facility].
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
## 2011-12-23 LAB — TROPONIN I: Troponin I: <0.3 ng/mL (ref <0.30)

## 2011-12-23 NOTE — Discharge Instructions (Signed)

## 2012-01-10 ENCOUNTER — Emergency Department (HOSPITAL_BASED_OUTPATIENT_CLINIC_OR_DEPARTMENT_OTHER)
Admission: EM | Admit: 2012-01-10 | Discharge: 2012-01-10 | Disposition: A | Discharge status: Home / Self Care | Payer: Self-pay | Attending: Emergency Medicine | Admitting: Emergency Medicine

## 2012-01-10 ENCOUNTER — Encounter (HOSPITAL_BASED_OUTPATIENT_CLINIC_OR_DEPARTMENT_OTHER): Payer: Self-pay | Admitting: *Deleted

## 2012-01-10 DIAGNOSIS — K644 Residual hemorrhoidal skin tags: Secondary | ICD-10-CM | POA: Insufficient documentation

## 2012-01-10 DIAGNOSIS — K219 Gastro-esophageal reflux disease without esophagitis: Secondary | ICD-10-CM | POA: Insufficient documentation

## 2012-01-10 DIAGNOSIS — K649 Unspecified hemorrhoids: Secondary | ICD-10-CM

## 2012-01-10 DIAGNOSIS — I10 Essential (primary) hypertension: Secondary | ICD-10-CM | POA: Insufficient documentation

## 2012-01-10 MED ORDER — HYDROCORTISONE 2.5 % RE CREA: TOPICAL_CREAM | RECTAL | Status: AC

## 2012-01-10 NOTE — ED Notes (Signed)
Has hemorrhoids is at [facility] they wont give him any over the counter cream there sent him here for meds/prescriptions

## 2012-01-10 NOTE — ED Notes (Signed)
[facility]ia [facility]--spoke with Rhonda---to transfer to CDU at [facility]
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
## 2012-01-10 NOTE — ED Notes (Signed)
Given wrong information on patient for transfer.

## 2012-01-10 NOTE — ED Provider Notes (Signed)
Medical screening examination/treatment/procedure(s) were performed by non-physician practitioner and as supervising physician I was immediately available for consultation/collaboration.   Saddie Benders. Calvin Jablonowski, MD 01/10/12 1516

## 2012-02-03 ENCOUNTER — Encounter (HOSPITAL_BASED_OUTPATIENT_CLINIC_OR_DEPARTMENT_OTHER): Payer: Self-pay | Admitting: *Deleted

## 2012-02-03 ENCOUNTER — Emergency Department (HOSPITAL_BASED_OUTPATIENT_CLINIC_OR_DEPARTMENT_OTHER)
Admission: EM | Admit: 2012-02-03 | Discharge: 2012-02-03 | Disposition: A | Discharge status: Home / Self Care | Payer: Self-pay | Attending: Emergency Medicine | Admitting: Emergency Medicine

## 2012-02-03 DIAGNOSIS — I10 Essential (primary) hypertension: Secondary | ICD-10-CM | POA: Insufficient documentation

## 2012-02-03 DIAGNOSIS — R51 Headache: Secondary | ICD-10-CM | POA: Insufficient documentation

## 2012-02-03 DIAGNOSIS — K219 Gastro-esophageal reflux disease without esophagitis: Secondary | ICD-10-CM | POA: Insufficient documentation

## 2012-02-03 LAB — POCT I-STAT, CHEM 8
BUN: 26 mg/dL — ABNORMAL HIGH (ref 6–23)
Calcium, Ion: 1.16 mmol/L (ref 1.12–1.23)
Chloride: 100 mEq/L (ref 96–112)
Creatinine, Ser: 2.1 mg/dL — ABNORMAL HIGH (ref 0.50–1.35)
Glucose, Bld: 90 mg/dL (ref 70–99)
HCT: 41 % (ref 39.0–52.0)
Hemoglobin: 13.9 g/dL (ref 13.0–17.0)
Potassium: 2.9 mEq/L — ABNORMAL LOW (ref 3.5–5.1)
Sodium: 142 mEq/L (ref 135–145)
TCO2: 29 mmol/L (ref 0–100)

## 2012-02-03 LAB — URINALYSIS, ROUTINE W REFLEX MICROSCOPIC
Bilirubin Urine: NEGATIVE
Glucose, UA: NEGATIVE mg/dL
Hgb urine dipstick: NEGATIVE
Ketones, ur: NEGATIVE mg/dL
Leukocytes, UA: NEGATIVE
Nitrite: NEGATIVE
Protein, ur: 30 mg/dL — AB
Specific Gravity, Urine: 1.016 (ref 1.005–1.030)
Urobilinogen, UA: 1 mg/dL (ref 0.0–1.0)
pH: 7 (ref 5.0–8.0)

## 2012-02-03 LAB — URINE MICROSCOPIC-ADD ON

## 2012-02-03 LAB — SEDIMENTATION RATE: Sed Rate: 10 mm/hr (ref 0–16)

## 2012-02-03 MED ORDER — ACETAMINOPHEN 325 MG PO TABS
650.0000 mg | ORAL_TABLET | Freq: Once | ORAL | Status: AC
  Administered 2012-02-03: 650 mg via ORAL
  Filled 2012-02-03: qty 2

## 2012-02-03 MED ORDER — POTASSIUM CHLORIDE CRYS ER 20 MEQ PO TBCR
60.0000 meq | EXTENDED_RELEASE_TABLET | Freq: Once | ORAL | Status: AC
  Administered 2012-02-03: 60 meq via ORAL
  Filled 2012-02-03: qty 3

## 2012-02-03 NOTE — ED Notes (Signed)
Patient states he developed a headache several days ago, and has been monitoring his blood pressure and it has been ranging 169/110.   Here today for additional evaluation.  C.o headache today.
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
## 2012-02-10 ENCOUNTER — Emergency Department (HOSPITAL_BASED_OUTPATIENT_CLINIC_OR_DEPARTMENT_OTHER): Payer: Self-pay

## 2012-02-10 ENCOUNTER — Encounter (HOSPITAL_BASED_OUTPATIENT_CLINIC_OR_DEPARTMENT_OTHER): Payer: Self-pay

## 2012-02-10 ENCOUNTER — Emergency Department (HOSPITAL_BASED_OUTPATIENT_CLINIC_OR_DEPARTMENT_OTHER)
Admission: EM | Admit: 2012-02-10 | Discharge: 2012-02-10 | Disposition: A | Discharge status: Home / Self Care | Payer: Self-pay | Attending: Emergency Medicine | Admitting: Emergency Medicine

## 2012-02-10 DIAGNOSIS — S46911A Strain of unspecified muscle, fascia and tendon at shoulder and upper arm level, right arm, initial encounter: Secondary | ICD-10-CM

## 2012-02-10 DIAGNOSIS — M549 Dorsalgia, unspecified: Secondary | ICD-10-CM | POA: Insufficient documentation

## 2012-02-10 DIAGNOSIS — X503XXA Overexertion from repetitive movements, initial encounter: Secondary | ICD-10-CM | POA: Insufficient documentation

## 2012-02-10 DIAGNOSIS — IMO0002 Reserved for concepts with insufficient information to code with codable children: Secondary | ICD-10-CM | POA: Insufficient documentation

## 2012-02-10 DIAGNOSIS — Y92838 Other recreation area as the place of occurrence of the external cause: Secondary | ICD-10-CM | POA: Insufficient documentation

## 2012-02-10 DIAGNOSIS — K219 Gastro-esophageal reflux disease without esophagitis: Secondary | ICD-10-CM | POA: Insufficient documentation

## 2012-02-10 DIAGNOSIS — I10 Essential (primary) hypertension: Secondary | ICD-10-CM | POA: Insufficient documentation

## 2012-02-10 DIAGNOSIS — Y9239 Other specified sports and athletic area as the place of occurrence of the external cause: Secondary | ICD-10-CM | POA: Insufficient documentation

## 2012-02-10 LAB — BASIC METABOLIC PANEL
BUN: 25 mg/dL — ABNORMAL HIGH (ref 6–23)
CO2: 33 mEq/L — ABNORMAL HIGH (ref 19–32)
Calcium: 9.8 mg/dL (ref 8.4–10.5)
Chloride: 97 mEq/L (ref 96–112)
Creatinine, Ser: 2.1 mg/dL — ABNORMAL HIGH (ref 0.50–1.35)
GFR calc Af Amer: 39 mL/min — ABNORMAL LOW (ref >90)
GFR calc non Af Amer: 34 mL/min — ABNORMAL LOW (ref >90)
Glucose, Bld: 82 mg/dL (ref 70–99)
Potassium: 3.1 mEq/L — ABNORMAL LOW (ref 3.5–5.1)
Sodium: 139 mEq/L (ref 135–145)

## 2012-02-10 MED ORDER — CLONIDINE HCL 0.1 MG PO TABS
0.1000 mg | ORAL_TABLET | Freq: Once | ORAL | Status: AC
  Administered 2012-02-10: 0.1 mg via ORAL
  Filled 2012-02-10: qty 1

## 2012-02-10 MED ORDER — CYCLOBENZAPRINE HCL 10 MG PO TABS
5.0000 mg | ORAL_TABLET | Freq: Once | ORAL | Status: AC
  Administered 2012-02-10: 5 mg via ORAL
  Filled 2012-02-10: qty 1

## 2012-02-10 MED ORDER — CYCLOBENZAPRINE HCL 5 MG PO TABS: 5.0000 mg | ORAL_TABLET | Freq: Three times a day (TID) | ORAL | Status: AC | PRN

## 2012-02-10 MED ORDER — CLONIDINE HCL 0.1 MG PO TABS: 0.1000 mg | ORAL_TABLET | Freq: Two times a day (BID) | ORAL | Status: DC

## 2012-02-10 MED ORDER — POTASSIUM CHLORIDE CRYS ER 20 MEQ PO TBCR
40.0000 meq | EXTENDED_RELEASE_TABLET | Freq: Once | ORAL | Status: AC
  Administered 2012-02-10: 40 meq via ORAL
  Filled 2012-02-10: qty 2

## 2012-02-10 NOTE — ED Notes (Signed)
Pt reports right shoulder pain radiating to back x 7 days.

## 2012-02-10 NOTE — ED Notes (Signed)
Patient transported to X-ray 
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
## 2012-02-10 NOTE — ED Notes (Signed)
MD at bedside. 

## 2013-05-15 ENCOUNTER — Encounter (HOSPITAL_COMMUNITY): Payer: Self-pay | Admitting: Emergency Medicine

## 2013-05-15 ENCOUNTER — Emergency Department (HOSPITAL_COMMUNITY)
Admission: EM | Admit: 2013-05-15 | Discharge: 2013-05-15 | Disposition: A | Discharge status: Home / Self Care | Payer: No Typology Code available for payment source | Attending: Emergency Medicine | Admitting: Emergency Medicine

## 2013-05-15 DIAGNOSIS — Z7982 Long term (current) use of aspirin: Secondary | ICD-10-CM | POA: Insufficient documentation

## 2013-05-15 DIAGNOSIS — Z79899 Other long term (current) drug therapy: Secondary | ICD-10-CM | POA: Insufficient documentation

## 2013-05-15 DIAGNOSIS — I10 Essential (primary) hypertension: Secondary | ICD-10-CM | POA: Insufficient documentation

## 2013-05-15 DIAGNOSIS — S0190XA Unspecified open wound of unspecified part of head, initial encounter: Secondary | ICD-10-CM | POA: Insufficient documentation

## 2013-05-15 DIAGNOSIS — Y939 Activity, unspecified: Secondary | ICD-10-CM | POA: Insufficient documentation

## 2013-05-15 DIAGNOSIS — Z8719 Personal history of other diseases of the digestive system: Secondary | ICD-10-CM | POA: Insufficient documentation

## 2013-05-15 DIAGNOSIS — Y929 Unspecified place or not applicable: Secondary | ICD-10-CM | POA: Insufficient documentation

## 2013-05-15 DIAGNOSIS — Z87448 Personal history of other diseases of urinary system: Secondary | ICD-10-CM | POA: Insufficient documentation

## 2013-05-15 DIAGNOSIS — Z8781 Personal history of (healed) traumatic fracture: Secondary | ICD-10-CM | POA: Insufficient documentation

## 2013-05-15 DIAGNOSIS — IMO0002 Reserved for concepts with insufficient information to code with codable children: Secondary | ICD-10-CM | POA: Insufficient documentation

## 2013-05-15 DIAGNOSIS — S0990XA Unspecified injury of head, initial encounter: Secondary | ICD-10-CM

## 2013-05-15 DIAGNOSIS — Y990 Civilian activity done for income or pay: Secondary | ICD-10-CM | POA: Insufficient documentation

## 2013-05-15 NOTE — ED Provider Notes (Signed)
Medical screening examination/treatment/procedure(s) were performed by non-physician practitioner and as supervising physician I was immediately available for consultation/collaboration.  EKG Interpretation   None         Blanchard Kelch, MD 05/15/13 2054

## 2013-05-15 NOTE — ED Notes (Signed)
Pt from work, hit head on vent, $ cm lac to forehead, bleeding controlled pta
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
## 2013-05-30 ENCOUNTER — Encounter (HOSPITAL_COMMUNITY): Payer: Self-pay | Admitting: Emergency Medicine

## 2013-05-30 ENCOUNTER — Emergency Department (HOSPITAL_COMMUNITY)
Admission: EM | Admit: 2013-05-30 | Discharge: 2013-05-30 | Disposition: A | Discharge status: Home / Self Care | Payer: No Typology Code available for payment source | Attending: Emergency Medicine | Admitting: Emergency Medicine

## 2013-05-30 DIAGNOSIS — Z7982 Long term (current) use of aspirin: Secondary | ICD-10-CM | POA: Insufficient documentation

## 2013-05-30 DIAGNOSIS — K648 Other hemorrhoids: Secondary | ICD-10-CM | POA: Insufficient documentation

## 2013-05-30 DIAGNOSIS — Z79899 Other long term (current) drug therapy: Secondary | ICD-10-CM | POA: Insufficient documentation

## 2013-05-30 DIAGNOSIS — Z87448 Personal history of other diseases of urinary system: Secondary | ICD-10-CM | POA: Insufficient documentation

## 2013-05-30 DIAGNOSIS — I10 Essential (primary) hypertension: Secondary | ICD-10-CM | POA: Insufficient documentation

## 2013-05-30 DIAGNOSIS — Z8781 Personal history of (healed) traumatic fracture: Secondary | ICD-10-CM | POA: Insufficient documentation

## 2013-05-30 DIAGNOSIS — Z8719 Personal history of other diseases of the digestive system: Secondary | ICD-10-CM | POA: Insufficient documentation

## 2013-05-30 DIAGNOSIS — K649 Unspecified hemorrhoids: Secondary | ICD-10-CM

## 2013-05-30 MED ORDER — DOCUSATE SODIUM 100 MG PO CAPS: 100.0000 mg | ORAL_CAPSULE | Freq: Two times a day (BID) | ORAL | Status: DC

## 2013-05-30 NOTE — ED Notes (Signed)
Pt states he has hemorrhoids.  He called his boss and told her he couldn't come to work and she told him he needed to bring a work note.  Pt also says he is constipated.

## 2013-05-30 NOTE — ED Notes (Signed)
Pt states he has been having issues with hemorrhoids for 88mos. Pt states a few days ago he had a flair up and is having some pain.
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
## 2013-07-24 ENCOUNTER — Emergency Department (HOSPITAL_COMMUNITY)
Admission: EM | Admit: 2013-07-24 | Discharge: 2013-07-24 | Disposition: A | Discharge status: Home / Self Care | Payer: No Typology Code available for payment source | Attending: Emergency Medicine | Admitting: Emergency Medicine

## 2013-07-24 ENCOUNTER — Emergency Department (HOSPITAL_COMMUNITY): Payer: No Typology Code available for payment source

## 2013-07-24 ENCOUNTER — Encounter (HOSPITAL_COMMUNITY): Payer: Self-pay | Admitting: Emergency Medicine

## 2013-07-24 DIAGNOSIS — R0602 Shortness of breath: Secondary | ICD-10-CM | POA: Insufficient documentation

## 2013-07-24 DIAGNOSIS — R259 Unspecified abnormal involuntary movements: Secondary | ICD-10-CM | POA: Insufficient documentation

## 2013-07-24 DIAGNOSIS — R6883 Chills (without fever): Secondary | ICD-10-CM | POA: Insufficient documentation

## 2013-07-24 DIAGNOSIS — Z79899 Other long term (current) drug therapy: Secondary | ICD-10-CM | POA: Insufficient documentation

## 2013-07-24 DIAGNOSIS — Z8719 Personal history of other diseases of the digestive system: Secondary | ICD-10-CM | POA: Insufficient documentation

## 2013-07-24 DIAGNOSIS — R072 Precordial pain: Secondary | ICD-10-CM | POA: Insufficient documentation

## 2013-07-24 DIAGNOSIS — R61 Generalized hyperhidrosis: Secondary | ICD-10-CM | POA: Insufficient documentation

## 2013-07-24 DIAGNOSIS — I10 Essential (primary) hypertension: Secondary | ICD-10-CM | POA: Insufficient documentation

## 2013-07-24 DIAGNOSIS — F411 Generalized anxiety disorder: Secondary | ICD-10-CM | POA: Insufficient documentation

## 2013-07-24 DIAGNOSIS — T50905A Adverse effect of unspecified drugs, medicaments and biological substances, initial encounter: Secondary | ICD-10-CM | POA: Diagnosis present

## 2013-07-24 DIAGNOSIS — Z8781 Personal history of (healed) traumatic fracture: Secondary | ICD-10-CM | POA: Insufficient documentation

## 2013-07-24 DIAGNOSIS — Z87448 Personal history of other diseases of urinary system: Secondary | ICD-10-CM | POA: Insufficient documentation

## 2013-07-24 DIAGNOSIS — Z7982 Long term (current) use of aspirin: Secondary | ICD-10-CM | POA: Insufficient documentation

## 2013-07-24 LAB — CBC
HCT: 48.9 % (ref 39.0–52.0)
Hemoglobin: 17.9 g/dL — ABNORMAL HIGH (ref 13.0–17.0)
MCH: 32.9 pg (ref 26.0–34.0)
MCHC: 36.6 g/dL — ABNORMAL HIGH (ref 30.0–36.0)
MCV: 89.9 fL (ref 78.0–100.0)
Platelets: 437 10*3/uL — ABNORMAL HIGH (ref 150–400)
RBC: 5.44 MIL/uL (ref 4.22–5.81)
RDW: 12.5 % (ref 11.5–15.5)
WBC: 8.8 10*3/uL (ref 4.0–10.5)

## 2013-07-24 LAB — BASIC METABOLIC PANEL
BUN: 26 mg/dL — ABNORMAL HIGH (ref 6–23)
CO2: 28 mEq/L (ref 19–32)
Calcium: 10.3 mg/dL (ref 8.4–10.5)
Chloride: 98 mEq/L (ref 96–112)
Creatinine, Ser: 1.78 mg/dL — ABNORMAL HIGH (ref 0.50–1.35)
GFR calc Af Amer: 47 mL/min — ABNORMAL LOW (ref >90)
GFR calc non Af Amer: 41 mL/min — ABNORMAL LOW (ref >90)
Glucose, Bld: 119 mg/dL — ABNORMAL HIGH (ref 70–99)
Potassium: 2.9 mEq/L — CL (ref 3.7–5.3)
Sodium: 144 mEq/L (ref 137–147)

## 2013-07-24 LAB — POCT I-STAT TROPONIN I: Troponin i, poc: 0.01 ng/mL (ref 0.00–0.08)

## 2013-07-24 MED ORDER — POTASSIUM CHLORIDE CRYS ER 20 MEQ PO TBCR: 40.0000 meq | EXTENDED_RELEASE_TABLET | Freq: Once | ORAL | Status: DC

## 2013-07-24 MED ORDER — DIPHENHYDRAMINE HCL 50 MG/ML IJ SOLN
12.5000 mg | Freq: Once | INTRAMUSCULAR | Status: AC
  Administered 2013-07-24: 12.5 mg via INTRAVENOUS
  Filled 2013-07-24: qty 1

## 2013-07-24 MED ORDER — CLONIDINE HCL 0.1 MG PO TABS
0.1000 mg | ORAL_TABLET | ORAL | Status: AC
  Administered 2013-07-24: 0.1 mg via ORAL
  Filled 2013-07-24: qty 1

## 2013-07-24 MED ORDER — ACETAMINOPHEN 325 MG PO TABS
650.0000 mg | ORAL_TABLET | Freq: Once | ORAL | Status: AC
  Administered 2013-07-24: 650 mg via ORAL
  Filled 2013-07-24: qty 2

## 2013-07-24 MED ORDER — METOCLOPRAMIDE HCL 5 MG/ML IJ SOLN
5.0000 mg | Freq: Once | INTRAMUSCULAR | Status: AC
  Administered 2013-07-24: 5 mg via INTRAVENOUS
  Filled 2013-07-24: qty 2

## 2013-07-24 MED ORDER — POTASSIUM CHLORIDE 10 MEQ/100ML IV SOLN
10.0000 meq | Freq: Once | INTRAVENOUS | Status: AC
  Administered 2013-07-24: 10 meq via INTRAVENOUS
  Filled 2013-07-24: qty 100

## 2013-07-24 MED ORDER — SODIUM CHLORIDE 0.9 % IV BOLUS (SEPSIS)
500.0000 mL | INTRAVENOUS | Status: AC
  Administered 2013-07-24: 500 mL via INTRAVENOUS

## 2013-07-24 MED ORDER — HYDRALAZINE HCL 20 MG/ML IJ SOLN
10.0000 mg | Freq: Once | INTRAMUSCULAR | Status: AC
  Administered 2013-07-24: 10 mg via INTRAVENOUS
  Filled 2013-07-24: qty 1

## 2013-07-24 MED ORDER — GI COCKTAIL ~~LOC~~
30.0000 mL | Freq: Once | ORAL | Status: AC
  Administered 2013-07-24: 30 mL via ORAL
  Filled 2013-07-24: qty 30

## 2013-07-24 MED ORDER — LORAZEPAM 1 MG PO TABS
0.5000 mg | ORAL_TABLET | Freq: Once | ORAL | Status: AC
  Administered 2013-07-24: 0.5 mg via ORAL
  Filled 2013-07-24: qty 1

## 2013-07-24 MED ORDER — ONDANSETRON HCL 4 MG/2ML IJ SOLN
4.0000 mg | Freq: Once | INTRAMUSCULAR | Status: AC
  Administered 2013-07-24: 4 mg via INTRAVENOUS
  Filled 2013-07-24: qty 2

## 2013-07-24 MED ORDER — LABETALOL HCL 5 MG/ML IV SOLN
10.0000 mg | Freq: Once | INTRAVENOUS | Status: AC
  Administered 2013-07-24: 10 mg via INTRAVENOUS
  Filled 2013-07-24: qty 4

## 2013-07-24 MED ORDER — METOPROLOL TARTRATE 25 MG PO TABS
25.0000 mg | ORAL_TABLET | ORAL | Status: AC
  Administered 2013-07-24: 25 mg via ORAL
  Filled 2013-07-24: qty 1

## 2013-07-24 NOTE — ED Notes (Signed)
EDP made aware of pt's BP. Pt appropriate for discharge. Pt denies any complaint, including pain.

## 2013-07-24 NOTE — ED Notes (Signed)
Per Katie @ lab, Potassium is 2.9.  Dr. Zenia Resides made aware.

## 2013-07-24 NOTE — ED Notes (Signed)
Pt has returned from being out of the department; pt placed back monitor, continuous pulse oximetry and blood pressure cuff

## 2013-07-24 NOTE — ED Notes (Signed)
Pt states he took "sexual enhancement pills" that he bought at a convenience store.  Pt states that he took the pills a couple of hours ago and has been having CP, diaphoresis, and shaking since that time.

## 2013-07-24 NOTE — ED Notes (Signed)
Apple juice given.  

## 2013-07-24 NOTE — ED Notes (Signed)
Neighbor Gwinda Passe if needed [phone]
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
## 2013-07-24 NOTE — ED Notes (Signed)
Patient transported to X-ray 

## 2014-03-22 ENCOUNTER — Encounter (HOSPITAL_COMMUNITY): Payer: Self-pay | Admitting: Emergency Medicine

## 2014-03-22 ENCOUNTER — Emergency Department (HOSPITAL_COMMUNITY): Payer: Self-pay

## 2014-03-22 ENCOUNTER — Emergency Department (HOSPITAL_COMMUNITY)
Admission: EM | Admit: 2014-03-22 | Discharge: 2014-03-22 | Disposition: A | Discharge status: Home / Self Care | Payer: Self-pay | Attending: Emergency Medicine | Admitting: Emergency Medicine

## 2014-03-22 DIAGNOSIS — R519 Headache, unspecified: Secondary | ICD-10-CM

## 2014-03-22 DIAGNOSIS — Z7982 Long term (current) use of aspirin: Secondary | ICD-10-CM | POA: Insufficient documentation

## 2014-03-22 DIAGNOSIS — Z87448 Personal history of other diseases of urinary system: Secondary | ICD-10-CM | POA: Insufficient documentation

## 2014-03-22 DIAGNOSIS — Z8719 Personal history of other diseases of the digestive system: Secondary | ICD-10-CM | POA: Insufficient documentation

## 2014-03-22 DIAGNOSIS — I10 Essential (primary) hypertension: Secondary | ICD-10-CM | POA: Insufficient documentation

## 2014-03-22 DIAGNOSIS — Z86011 Personal history of benign neoplasm of the brain: Secondary | ICD-10-CM | POA: Insufficient documentation

## 2014-03-22 DIAGNOSIS — Z79899 Other long term (current) drug therapy: Secondary | ICD-10-CM | POA: Insufficient documentation

## 2014-03-22 DIAGNOSIS — Z8781 Personal history of (healed) traumatic fracture: Secondary | ICD-10-CM | POA: Insufficient documentation

## 2014-03-22 DIAGNOSIS — R51 Headache: Secondary | ICD-10-CM | POA: Insufficient documentation

## 2014-03-22 DIAGNOSIS — H539 Unspecified visual disturbance: Secondary | ICD-10-CM | POA: Insufficient documentation

## 2014-03-22 LAB — COMPREHENSIVE METABOLIC PANEL
ALT: 129 U/L — ABNORMAL HIGH (ref 0–53)
AST: 77 U/L — ABNORMAL HIGH (ref 0–37)
Albumin: 3.8 g/dL (ref 3.5–5.2)
Alkaline Phosphatase: 63 U/L (ref 39–117)
Anion gap: 12 (ref 5–15)
BUN: 22 mg/dL (ref 6–23)
CO2: 29 mEq/L (ref 19–32)
Calcium: 9.6 mg/dL (ref 8.4–10.5)
Chloride: 97 mEq/L (ref 96–112)
Creatinine, Ser: 1.69 mg/dL — ABNORMAL HIGH (ref 0.50–1.35)
GFR calc Af Amer: 50 mL/min — ABNORMAL LOW (ref >90)
GFR calc non Af Amer: 43 mL/min — ABNORMAL LOW (ref >90)
Glucose, Bld: 104 mg/dL — ABNORMAL HIGH (ref 70–99)
Potassium: 3.8 mEq/L (ref 3.7–5.3)
Sodium: 138 mEq/L (ref 137–147)
Total Bilirubin: 0.4 mg/dL (ref 0.3–1.2)
Total Protein: 8.2 g/dL (ref 6.0–8.3)

## 2014-03-22 LAB — RAPID URINE DRUG SCREEN, HOSP PERFORMED
Amphetamines: NOT DETECTED
Barbiturates: NOT DETECTED
Benzodiazepines: NOT DETECTED
Cocaine: NOT DETECTED
Opiates: NOT DETECTED
Tetrahydrocannabinol: NOT DETECTED

## 2014-03-22 LAB — DIFFERENTIAL
Basophils Absolute: 0 10*3/uL (ref 0.0–0.1)
Basophils Relative: 0 % (ref 0–1)
Eosinophils Absolute: 0.1 10*3/uL (ref 0.0–0.7)
Eosinophils Relative: 1 % (ref 0–5)
Lymphocytes Relative: 21 % (ref 12–46)
Lymphs Abs: 1.4 10*3/uL (ref 0.7–4.0)
Monocytes Absolute: 0.6 10*3/uL (ref 0.1–1.0)
Monocytes Relative: 8 % (ref 3–12)
Neutro Abs: 4.7 10*3/uL (ref 1.7–7.7)
Neutrophils Relative %: 70 % (ref 43–77)

## 2014-03-22 LAB — URINALYSIS, ROUTINE W REFLEX MICROSCOPIC
Bilirubin Urine: NEGATIVE
Glucose, UA: NEGATIVE mg/dL
Hgb urine dipstick: NEGATIVE
Ketones, ur: NEGATIVE mg/dL
Leukocytes, UA: NEGATIVE
Nitrite: NEGATIVE
Protein, ur: >300 mg/dL — AB
Specific Gravity, Urine: 1.015 (ref 1.005–1.030)
Urobilinogen, UA: 1 mg/dL (ref 0.0–1.0)
pH: 7 (ref 5.0–8.0)

## 2014-03-22 LAB — CBC
HCT: 43.2 % (ref 39.0–52.0)
Hemoglobin: 15.1 g/dL (ref 13.0–17.0)
MCH: 31.7 pg (ref 26.0–34.0)
MCHC: 35 g/dL (ref 30.0–36.0)
MCV: 90.8 fL (ref 78.0–100.0)
Platelets: 588 10*3/uL — ABNORMAL HIGH (ref 150–400)
RBC: 4.76 MIL/uL (ref 4.22–5.81)
RDW: 12.8 % (ref 11.5–15.5)
WBC: 6.8 10*3/uL (ref 4.0–10.5)

## 2014-03-22 LAB — I-STAT CHEM 8, ED
BUN: 24 mg/dL — ABNORMAL HIGH (ref 6–23)
Calcium, Ion: 1.17 mmol/L (ref 1.12–1.23)
Chloride: 96 mEq/L (ref 96–112)
Creatinine, Ser: 1.7 mg/dL — ABNORMAL HIGH (ref 0.50–1.35)
Glucose, Bld: 105 mg/dL — ABNORMAL HIGH (ref 70–99)
HCT: 50 % (ref 39.0–52.0)
Hemoglobin: 17 g/dL (ref 13.0–17.0)
Potassium: 3.2 mEq/L — ABNORMAL LOW (ref 3.7–5.3)
Sodium: 138 mEq/L (ref 137–147)
TCO2: 29 mmol/L (ref 0–100)

## 2014-03-22 LAB — APTT: aPTT: 31 seconds (ref 24–37)

## 2014-03-22 LAB — ETHANOL: Alcohol, Ethyl (B): <11 mg/dL (ref 0–11)

## 2014-03-22 LAB — URINE MICROSCOPIC-ADD ON

## 2014-03-22 LAB — PROTIME-INR
INR: 1.06 (ref 0.00–1.49)
Prothrombin Time: 13.8 seconds (ref 11.6–15.2)

## 2014-03-22 LAB — I-STAT TROPONIN, ED: Troponin i, poc: 0.02 ng/mL (ref 0.00–0.08)

## 2014-03-22 MED ORDER — DEXAMETHASONE SODIUM PHOSPHATE 10 MG/ML IJ SOLN
10.0000 mg | Freq: Once | INTRAMUSCULAR | Status: AC
  Administered 2014-03-22: 10 mg via INTRAVENOUS
  Filled 2014-03-22: qty 1

## 2014-03-22 MED ORDER — DIPHENHYDRAMINE HCL 50 MG/ML IJ SOLN
25.0000 mg | Freq: Once | INTRAMUSCULAR | Status: AC
  Administered 2014-03-22: 25 mg via INTRAVENOUS
  Filled 2014-03-22: qty 1

## 2014-03-22 MED ORDER — POTASSIUM CHLORIDE CRYS ER 20 MEQ PO TBCR
40.0000 meq | EXTENDED_RELEASE_TABLET | Freq: Once | ORAL | Status: AC
  Administered 2014-03-22: 40 meq via ORAL
  Filled 2014-03-22: qty 2

## 2014-03-22 MED ORDER — METOCLOPRAMIDE HCL 5 MG/ML IJ SOLN
10.0000 mg | Freq: Once | INTRAMUSCULAR | Status: AC
  Administered 2014-03-22: 10 mg via INTRAVENOUS
  Filled 2014-03-22: qty 2

## 2014-03-22 MED ORDER — SODIUM CHLORIDE 0.9 % IV BOLUS (SEPSIS)
1000.0000 mL | Freq: Once | INTRAVENOUS | Status: AC
  Administered 2014-03-22: 1000 mL via INTRAVENOUS
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
## 2014-03-22 NOTE — Progress Notes (Deleted)
[place],  Provided pt with list of self-pay providers to help patient establish primary care. Patient was sleeping, left information with visitors.

## 2014-03-22 NOTE — ED Notes (Signed)
Pt is calling for transport

## 2014-03-22 NOTE — Discharge Instructions (Signed)

## 2014-03-22 NOTE — ED Notes (Signed)
Per pt, states headache in back of head and having vision changes

## 2014-03-22 NOTE — Progress Notes (Signed)
[facility]ia[facility] [place], ° °Provided pt with a list of self-pay providers to help patient establish primary care.  °

## 2014-03-24 NOTE — ED Provider Notes (Signed)
Medical screening examination/treatment/procedure(s) were performed by non-physician practitioner and as supervising physician I was immediately available for consultation/collaboration.   EKG Interpretation   Date/Time:  Tuesday March 22 2014 15:43:25 EDT Ventricular Rate:  44 PR Interval:  155 QRS Duration: 98 QT Interval:  494 QTC Calculation: 423 R Axis:   67 Text Interpretation:  Sinus bradycardia Minimal ST depression Baseline  wander in lead(s) V2 V3 V4 V5 V6 Confirmed by Jeneen Rinks  MD, [place] ([id number]) on  03/22/2014 3:57:56 PM        Tanna Furry, MD 03/24/14 [phone]

## 2014-10-06 ENCOUNTER — Encounter (HOSPITAL_COMMUNITY): Payer: Self-pay | Admitting: Physical Medicine and Rehabilitation

## 2014-10-06 ENCOUNTER — Emergency Department (HOSPITAL_COMMUNITY)
Admission: EM | Admit: 2014-10-06 | Discharge: 2014-10-06 | Disposition: A | Discharge status: Home / Self Care | Payer: Self-pay | Attending: Emergency Medicine | Admitting: Emergency Medicine

## 2014-10-06 DIAGNOSIS — Z7982 Long term (current) use of aspirin: Secondary | ICD-10-CM | POA: Insufficient documentation

## 2014-10-06 DIAGNOSIS — Z8719 Personal history of other diseases of the digestive system: Secondary | ICD-10-CM | POA: Insufficient documentation

## 2014-10-06 DIAGNOSIS — Z87448 Personal history of other diseases of urinary system: Secondary | ICD-10-CM | POA: Insufficient documentation

## 2014-10-06 DIAGNOSIS — Z760 Encounter for issue of repeat prescription: Secondary | ICD-10-CM | POA: Insufficient documentation

## 2014-10-06 DIAGNOSIS — Z79899 Other long term (current) drug therapy: Secondary | ICD-10-CM | POA: Insufficient documentation

## 2014-10-06 DIAGNOSIS — I160 Hypertensive urgency: Secondary | ICD-10-CM

## 2014-10-06 DIAGNOSIS — I10 Essential (primary) hypertension: Secondary | ICD-10-CM | POA: Insufficient documentation

## 2014-10-06 DIAGNOSIS — Z8781 Personal history of (healed) traumatic fracture: Secondary | ICD-10-CM | POA: Insufficient documentation

## 2014-10-06 LAB — CBC WITH DIFFERENTIAL/PLATELET
Basophils Absolute: 0 10*3/uL (ref 0.0–0.1)
Basophils Relative: 0 % (ref 0–1)
Eosinophils Absolute: 0 10*3/uL (ref 0.0–0.7)
Eosinophils Relative: 1 % (ref 0–5)
HCT: 46.8 % (ref 39.0–52.0)
Hemoglobin: 16.1 g/dL (ref 13.0–17.0)
Lymphocytes Relative: 30 % (ref 12–46)
Lymphs Abs: 1.5 10*3/uL (ref 0.7–4.0)
MCH: 32.2 pg (ref 26.0–34.0)
MCHC: 34.4 g/dL (ref 30.0–36.0)
MCV: 93.6 fL (ref 78.0–100.0)
Monocytes Absolute: 0.3 10*3/uL (ref 0.1–1.0)
Monocytes Relative: 5 % (ref 3–12)
Neutro Abs: 3.2 10*3/uL (ref 1.7–7.7)
Neutrophils Relative %: 64 % (ref 43–77)
Platelets: 201 10*3/uL (ref 150–400)
RBC: 5 MIL/uL (ref 4.22–5.81)
RDW: 13.2 % (ref 11.5–15.5)
WBC: 5.1 10*3/uL (ref 4.0–10.5)

## 2014-10-06 LAB — BASIC METABOLIC PANEL
Anion gap: 13 (ref 5–15)
BUN: 17 mg/dL (ref 6–23)
CO2: 25 mmol/L (ref 19–32)
Calcium: 9.6 mg/dL (ref 8.4–10.5)
Chloride: 103 mmol/L (ref 96–112)
Creatinine, Ser: 1.7 mg/dL — ABNORMAL HIGH (ref 0.50–1.35)
GFR calc Af Amer: 50 mL/min — ABNORMAL LOW (ref >90)
GFR calc non Af Amer: 43 mL/min — ABNORMAL LOW (ref >90)
Glucose, Bld: 102 mg/dL — ABNORMAL HIGH (ref 70–99)
Potassium: 3.7 mmol/L (ref 3.5–5.1)
Sodium: 141 mmol/L (ref 135–145)

## 2014-10-06 MED ORDER — CLONIDINE HCL 0.2 MG PO TABS
0.2000 mg | ORAL_TABLET | Freq: Once | ORAL | Status: AC
  Administered 2014-10-06: 0.2 mg via ORAL
  Filled 2014-10-06: qty 1

## 2014-10-06 MED ORDER — AMLODIPINE BESYLATE 10 MG PO TABS: 10.0000 mg | ORAL_TABLET | Freq: Every day | ORAL | Status: DC

## 2014-10-06 MED ORDER — SILDENAFIL CITRATE 100 MG PO TABS: 100.0000 mg | ORAL_TABLET | Freq: Every day | ORAL | Status: DC | PRN

## 2014-10-06 MED ORDER — HYDROCHLOROTHIAZIDE 25 MG PO TABS: 25.0000 mg | ORAL_TABLET | Freq: Every day | ORAL | Status: DC

## 2014-10-06 MED ORDER — LABETALOL HCL 200 MG PO TABS
200.0000 mg | ORAL_TABLET | Freq: Once | ORAL | Status: DC
  Filled 2014-10-06: qty 1

## 2014-10-06 MED ORDER — METOPROLOL TARTRATE 25 MG PO TABS: 25.0000 mg | ORAL_TABLET | Freq: Two times a day (BID) | ORAL | Status: DC

## 2014-10-06 NOTE — ED Notes (Signed)
Pt presents to department for evaluation of headache and medication refill. Reports he ran out of BP medications x2 weeks ago. Now reports 7/10 headache. Pt is alert and oriented x4.
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
## 2014-10-06 NOTE — Discharge Instructions (Signed)
Continue your blood pressure medicines as before.  Follow-up with your primary Dr. in the next week, and return to the ER if your symptoms significantly worsen or change.  Keep a record of your blood pressures which you can take to your next appointment.   Hypertension Hypertension, commonly called high blood pressure, is when the force of blood pumping through your arteries is too strong. Your arteries are the blood vessels that carry blood from your heart throughout your body. A blood pressure reading consists of a higher number over a lower number, such as 110/72. The higher number (systolic) is the pressure inside your arteries when your heart pumps. The lower number (diastolic) is the pressure inside your arteries when your heart relaxes. Ideally you want your blood pressure below 120/80. Hypertension forces your heart to work harder to pump blood. Your arteries may become narrow or stiff. Having hypertension puts you at risk for heart disease, stroke, and other problems.  RISK FACTORS Some risk factors for high blood pressure are controllable. Others are not.  Risk factors you cannot control include:   Race. You may be at higher risk if you are African American.  Age. Risk increases with age.  Gender. Men are at higher risk than women before age 51 years. After age 79, women are at higher risk than men. Risk factors you can control include:  Not getting enough exercise or physical activity.  Being overweight.  Getting too much fat, sugar, calories, or salt in your diet.  Drinking too much alcohol. SIGNS AND SYMPTOMS Hypertension does not usually cause signs or symptoms. Extremely high blood pressure (hypertensive crisis) may cause headache, anxiety, shortness of breath, and nosebleed. DIAGNOSIS  To check if you have hypertension, your health care provider will measure your blood pressure while you are seated, with your arm held at the level of your heart. It should be measured at  least twice using the same arm. Certain conditions can cause a difference in blood pressure between your right and left arms. A blood pressure reading that is higher than normal on one occasion does not mean that you need treatment. If one blood pressure reading is high, ask your health care provider about having it checked again. TREATMENT  Treating high blood pressure includes making lifestyle changes and possibly taking medicine. Living a healthy lifestyle can help lower high blood pressure. You may need to change some of your habits. Lifestyle changes may include:  Following the DASH diet. This diet is high in fruits, vegetables, and whole grains. It is low in salt, red meat, and added sugars.  Getting at least 2 hours of brisk physical activity every week.  Losing weight if necessary.  Not smoking.  Limiting alcoholic beverages.  Learning ways to reduce stress. If lifestyle changes are not enough to get your blood pressure under control, your health care provider may prescribe medicine. You may need to take more than one. Work closely with your health care provider to understand the risks and benefits. HOME CARE INSTRUCTIONS  Have your blood pressure rechecked as directed by your health care provider.   Take medicines only as directed by your health care provider. Follow the directions carefully. Blood pressure medicines must be taken as prescribed. The medicine does not work as well when you skip doses. Skipping doses also puts you at risk for problems.   Do not smoke.   Monitor your blood pressure at home as directed by your health care provider. SEEK MEDICAL CARE IF:  You think you are having a reaction to medicines taken.  You have recurrent headaches or feel dizzy.  You have swelling in your ankles.  You have trouble with your vision. SEEK IMMEDIATE MEDICAL CARE IF:  You develop a severe headache or confusion.  You have unusual weakness, numbness, or feel  faint.  You have severe chest or abdominal pain.  You vomit repeatedly.  You have trouble breathing. MAKE SURE YOU:   Understand these instructions.  Will watch your condition.  Will get help right away if you are not doing well or get worse. Document Released: 06/17/2005 Document Revised: 11/01/2013 Document Reviewed: 04/09/2013 [facility] Patient Information 2015 [place], [place]. This information is not intended to replace advice given to you by your health care provider. Make sure you discuss any questions you have with your health care provider.

## 2014-10-18 ENCOUNTER — Emergency Department (HOSPITAL_COMMUNITY)
Admission: EM | Admit: 2014-10-18 | Discharge: 2014-10-18 | Disposition: A | Discharge status: Home / Self Care | Payer: Self-pay | Attending: Emergency Medicine | Admitting: Emergency Medicine

## 2014-10-18 ENCOUNTER — Emergency Department (HOSPITAL_COMMUNITY): Payer: Self-pay

## 2014-10-18 ENCOUNTER — Encounter (HOSPITAL_COMMUNITY): Payer: Self-pay | Admitting: Neurology

## 2014-10-18 DIAGNOSIS — Z79899 Other long term (current) drug therapy: Secondary | ICD-10-CM | POA: Insufficient documentation

## 2014-10-18 DIAGNOSIS — Z87448 Personal history of other diseases of urinary system: Secondary | ICD-10-CM | POA: Insufficient documentation

## 2014-10-18 DIAGNOSIS — K115 Sialolithiasis: Secondary | ICD-10-CM | POA: Insufficient documentation

## 2014-10-18 DIAGNOSIS — Z8719 Personal history of other diseases of the digestive system: Secondary | ICD-10-CM | POA: Insufficient documentation

## 2014-10-18 DIAGNOSIS — K1120 Sialoadenitis, unspecified: Secondary | ICD-10-CM | POA: Insufficient documentation

## 2014-10-18 DIAGNOSIS — Z8781 Personal history of (healed) traumatic fracture: Secondary | ICD-10-CM | POA: Insufficient documentation

## 2014-10-18 DIAGNOSIS — I10 Essential (primary) hypertension: Secondary | ICD-10-CM | POA: Insufficient documentation

## 2014-10-18 DIAGNOSIS — Z7982 Long term (current) use of aspirin: Secondary | ICD-10-CM | POA: Insufficient documentation

## 2014-10-18 LAB — CBC
HCT: 46.8 % (ref 39.0–52.0)
Hemoglobin: 15.8 g/dL (ref 13.0–17.0)
MCH: 31.6 pg (ref 26.0–34.0)
MCHC: 33.8 g/dL (ref 30.0–36.0)
MCV: 93.6 fL (ref 78.0–100.0)
Platelets: 226 10*3/uL (ref 150–400)
RBC: 5 MIL/uL (ref 4.22–5.81)
RDW: 12.5 % (ref 11.5–15.5)
WBC: 11.1 10*3/uL — ABNORMAL HIGH (ref 4.0–10.5)

## 2014-10-18 LAB — BASIC METABOLIC PANEL
Anion gap: 10 (ref 5–15)
BUN: 16 mg/dL (ref 6–23)
CO2: 27 mmol/L (ref 19–32)
Calcium: 8.7 mg/dL (ref 8.4–10.5)
Chloride: 98 mmol/L (ref 96–112)
Creatinine, Ser: 1.46 mg/dL — ABNORMAL HIGH (ref 0.50–1.35)
GFR calc Af Amer: 60 mL/min — ABNORMAL LOW (ref >90)
GFR calc non Af Amer: 52 mL/min — ABNORMAL LOW (ref >90)
Glucose, Bld: 122 mg/dL — ABNORMAL HIGH (ref 70–99)
Potassium: 3.1 mmol/L — ABNORMAL LOW (ref 3.5–5.1)
Sodium: 135 mmol/L (ref 135–145)

## 2014-10-18 MED ORDER — SODIUM CHLORIDE 0.9 % IV BOLUS (SEPSIS)
1000.0000 mL | Freq: Once | INTRAVENOUS | Status: AC
  Administered 2014-10-18: 1000 mL via INTRAVENOUS

## 2014-10-18 MED ORDER — OXYCODONE-ACETAMINOPHEN 5-325 MG/5ML PO SOLN: 5.0000 mL | ORAL | Status: DC | PRN

## 2014-10-18 MED ORDER — CLINDAMYCIN HCL 150 MG PO CAPS: 300.0000 mg | ORAL_CAPSULE | Freq: Four times a day (QID) | ORAL | Status: DC

## 2014-10-18 MED ORDER — RANITIDINE HCL 15 MG/ML PO SYRP: 75.0000 mg | ORAL_SOLUTION | Freq: Once | ORAL | Status: DC

## 2014-10-18 MED ORDER — IOHEXOL 300 MG/ML  SOLN
80.0000 mL | Freq: Once | INTRAMUSCULAR | Status: AC | PRN
  Administered 2014-10-18: 75 mL via INTRAVENOUS

## 2014-10-18 MED ORDER — OXYCODONE-ACETAMINOPHEN 5-325 MG PO TABS: 1.0000 | ORAL_TABLET | Freq: Once | ORAL | Status: DC

## 2014-10-18 MED ORDER — EPINEPHRINE 0.3 MG/0.3ML IJ SOAJ
0.3000 mg | Freq: Once | INTRAMUSCULAR | Status: AC
  Administered 2014-10-18: 0.3 mg via INTRAMUSCULAR
  Filled 2014-10-18: qty 0.3

## 2014-10-18 MED ORDER — PREDNISONE 10 MG PO TABS: 60.0000 mg | ORAL_TABLET | Freq: Every day | ORAL | Status: DC

## 2014-10-18 MED ORDER — OXYCODONE HCL 5 MG/5ML PO SOLN
5.0000 mg | Freq: Once | ORAL | Status: AC
  Administered 2014-10-18: 5 mg via ORAL

## 2014-10-18 MED ORDER — RANITIDINE HCL 150 MG/10ML PO SYRP
75.0000 mg | ORAL_SOLUTION | Freq: Once | ORAL | Status: AC
  Administered 2014-10-18: 75 mg via ORAL
  Filled 2014-10-18: qty 10

## 2014-10-18 MED ORDER — METHYLPREDNISOLONE SODIUM SUCC 125 MG IJ SOLR
125.0000 mg | Freq: Once | INTRAMUSCULAR | Status: AC
  Administered 2014-10-18: 125 mg via INTRAVENOUS
  Filled 2014-10-18: qty 2

## 2014-10-18 MED ORDER — MORPHINE SULFATE 4 MG/ML IJ SOLN
4.0000 mg | Freq: Once | INTRAMUSCULAR | Status: AC
Start: 2014-10-18 — End: 2014-10-18
  Administered 2014-10-18: 4 mg via INTRAVENOUS
  Filled 2014-10-18: qty 1

## 2014-10-18 MED ORDER — CLINDAMYCIN PALMITATE HCL 75 MG/5ML PO SOLR
300.0000 mg | Freq: Once | ORAL | Status: AC
  Administered 2014-10-18: 300 mg via ORAL
  Filled 2014-10-18: qty 20

## 2014-10-18 MED ORDER — DIPHENHYDRAMINE HCL 50 MG/ML IJ SOLN
25.0000 mg | Freq: Once | INTRAMUSCULAR | Status: AC
  Administered 2014-10-18: 25 mg via INTRAVENOUS
  Filled 2014-10-18: qty 1

## 2014-10-18 MED ORDER — ACETAMINOPHEN 160 MG/5ML PO SOLN
325.0000 mg | Freq: Once | ORAL | Status: AC
  Administered 2014-10-18: 325 mg via ORAL
  Filled 2014-10-18: qty 20.3

## 2014-10-18 NOTE — ED Notes (Signed)
Pt reports last night seen at [facility] for salivary stone, this morning woke up with swelling to his tongue. Yesterday was started on naproxen and vicodin. No respiratory distress. Is a x 4.

## 2014-10-18 NOTE — ED Notes (Signed)
[name], RN present when vitals were taken

## 2014-10-18 NOTE — Discharge Instructions (Signed)
Salivary Stone °Your exam shows you have a stone in one of your saliva glands. These small stones form around a mucous plug in the ducts of the glands and cause the saliva in the gland to be blocked. This makes the gland swollen and painful, especially when you eat. If repeated episodes occur, the gland can become infected. Sometimes these stones can be seen on x-ray. °Treatment includes stimulating the production of saliva to push the stone out. You should suck on a lemon or sour candies several times daily. Antibiotic medicine may be needed if the gland is infected. Increasing fluids, applying warm compresses to the swollen area 3-4 times daily, and massaging the gland from back to front may encourage drainage and passage of the stone. °Surgical treatment to remove the stone is sometimes necessary, so proper medical follow up is very important. Call your doctor for an appointment as recommended. Call right away if you have a high fever, severe headache, vomiting, uncontrolled pain, or other serious symptoms. °Document Released: 07/25/2004 Document Revised: 09/09/2011 Document Reviewed: 06/17/2005 °[employer]® Patient Information ©2015 [employer]. This information is not intended to replace advice given to you by your health care provider. Make sure you discuss any questions you have with your health care provider. ° °
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
## 2014-10-24 ENCOUNTER — Emergency Department (HOSPITAL_COMMUNITY)
Admission: EM | Admit: 2014-10-24 | Discharge: 2014-10-25 | Disposition: A | Discharge status: Home / Self Care | Payer: Self-pay | Attending: Emergency Medicine | Admitting: Emergency Medicine

## 2014-10-24 ENCOUNTER — Encounter (HOSPITAL_COMMUNITY): Payer: Self-pay | Admitting: Emergency Medicine

## 2014-10-24 DIAGNOSIS — I10 Essential (primary) hypertension: Secondary | ICD-10-CM | POA: Insufficient documentation

## 2014-10-24 DIAGNOSIS — Z7982 Long term (current) use of aspirin: Secondary | ICD-10-CM | POA: Insufficient documentation

## 2014-10-24 DIAGNOSIS — Z79899 Other long term (current) drug therapy: Secondary | ICD-10-CM | POA: Insufficient documentation

## 2014-10-24 DIAGNOSIS — Z8719 Personal history of other diseases of the digestive system: Secondary | ICD-10-CM | POA: Insufficient documentation

## 2014-10-24 DIAGNOSIS — Z87448 Personal history of other diseases of urinary system: Secondary | ICD-10-CM | POA: Insufficient documentation

## 2014-10-24 DIAGNOSIS — R079 Chest pain, unspecified: Secondary | ICD-10-CM | POA: Insufficient documentation

## 2014-10-24 DIAGNOSIS — Z8781 Personal history of (healed) traumatic fracture: Secondary | ICD-10-CM | POA: Insufficient documentation

## 2014-10-24 LAB — CBC
HCT: 43.4 % (ref 39.0–52.0)
Hemoglobin: 15.3 g/dL (ref 13.0–17.0)
MCH: 32.3 pg (ref 26.0–34.0)
MCHC: 35.3 g/dL (ref 30.0–36.0)
MCV: 91.6 fL (ref 78.0–100.0)
Platelets: 256 10*3/uL (ref 150–400)
RBC: 4.74 MIL/uL (ref 4.22–5.81)
RDW: 12.5 % (ref 11.5–15.5)
WBC: 7.3 10*3/uL (ref 4.0–10.5)

## 2014-10-24 LAB — BASIC METABOLIC PANEL
Anion gap: 11 (ref 5–15)
BUN: 26 mg/dL — ABNORMAL HIGH (ref 6–23)
CO2: 24 mmol/L (ref 19–32)
Calcium: 9.4 mg/dL (ref 8.4–10.5)
Chloride: 103 mmol/L (ref 96–112)
Creatinine, Ser: 1.63 mg/dL — ABNORMAL HIGH (ref 0.50–1.35)
GFR calc Af Amer: 52 mL/min — ABNORMAL LOW (ref >90)
GFR calc non Af Amer: 45 mL/min — ABNORMAL LOW (ref >90)
Glucose, Bld: 134 mg/dL — ABNORMAL HIGH (ref 70–99)
Potassium: 3.3 mmol/L — ABNORMAL LOW (ref 3.5–5.1)
Sodium: 138 mmol/L (ref 135–145)

## 2014-10-24 LAB — BRAIN NATRIURETIC PEPTIDE: B Natriuretic Peptide: 81 pg/mL (ref 0.0–100.0)

## 2014-10-24 LAB — I-STAT TROPONIN, ED: Troponin i, poc: 0.01 ng/mL (ref 0.00–0.08)

## 2014-10-24 MED ORDER — IBUPROFEN 400 MG PO TABS
600.0000 mg | ORAL_TABLET | Freq: Once | ORAL | Status: AC
  Administered 2014-10-25: 600 mg via ORAL
  Filled 2014-10-24 (×2): qty 1

## 2014-10-24 MED ORDER — OXYCODONE-ACETAMINOPHEN 5-325 MG PO TABS
1.0000 | ORAL_TABLET | Freq: Once | ORAL | Status: AC
  Administered 2014-10-25: 1 via ORAL
  Filled 2014-10-24: qty 1

## 2014-10-24 NOTE — ED Notes (Addendum)
My chest pain didn't start until I started taking my new meds. Started clindamycin and prednisone on the 19th. Pt had allergic reaction to a medication and was put on the above medications due to the reaction. Pt stated that the medication he had a reaction to made his tongue swell up.
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
## 2014-10-24 NOTE — ED Notes (Signed)
Pt also complaining of "stones in my neck, they hurt, they are about a 3, and I don't have any pain medication for it".

## 2014-10-24 NOTE — ED Notes (Signed)
Dr. Campos at bedside   

## 2014-10-24 NOTE — ED Notes (Signed)
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

## 2014-10-24 NOTE — ED Notes (Signed)
Patient here with chest pain x6 days. Described as burning. States no previous cardiac problems. 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 Additionally reports shortness of breath.

## 2014-10-25 ENCOUNTER — Emergency Department (HOSPITAL_COMMUNITY): Payer: Self-pay

## 2014-10-25 NOTE — Discharge Instructions (Signed)

## 2015-01-09 ENCOUNTER — Encounter (HOSPITAL_COMMUNITY): Payer: Self-pay | Admitting: Nurse Practitioner

## 2015-01-09 ENCOUNTER — Emergency Department (HOSPITAL_COMMUNITY)
Admission: EM | Admit: 2015-01-09 | Discharge: 2015-01-09 | Disposition: A | Discharge status: Home / Self Care | Payer: Self-pay | Attending: Emergency Medicine | Admitting: Emergency Medicine

## 2015-01-09 ENCOUNTER — Emergency Department (HOSPITAL_COMMUNITY): Payer: Self-pay

## 2015-01-09 DIAGNOSIS — Z7982 Long term (current) use of aspirin: Secondary | ICD-10-CM | POA: Insufficient documentation

## 2015-01-09 DIAGNOSIS — W208XXA Other cause of strike by thrown, projected or falling object, initial encounter: Secondary | ICD-10-CM | POA: Insufficient documentation

## 2015-01-09 DIAGNOSIS — Y9289 Other specified places as the place of occurrence of the external cause: Secondary | ICD-10-CM | POA: Insufficient documentation

## 2015-01-09 DIAGNOSIS — Z760 Encounter for issue of repeat prescription: Secondary | ICD-10-CM | POA: Insufficient documentation

## 2015-01-09 DIAGNOSIS — S59901A Unspecified injury of right elbow, initial encounter: Secondary | ICD-10-CM | POA: Insufficient documentation

## 2015-01-09 DIAGNOSIS — Z8719 Personal history of other diseases of the digestive system: Secondary | ICD-10-CM | POA: Insufficient documentation

## 2015-01-09 DIAGNOSIS — Z79899 Other long term (current) drug therapy: Secondary | ICD-10-CM | POA: Insufficient documentation

## 2015-01-09 DIAGNOSIS — Z87448 Personal history of other diseases of urinary system: Secondary | ICD-10-CM | POA: Insufficient documentation

## 2015-01-09 DIAGNOSIS — S4991XA Unspecified injury of right shoulder and upper arm, initial encounter: Secondary | ICD-10-CM | POA: Insufficient documentation

## 2015-01-09 DIAGNOSIS — Y998 Other external cause status: Secondary | ICD-10-CM | POA: Insufficient documentation

## 2015-01-09 DIAGNOSIS — Y9389 Activity, other specified: Secondary | ICD-10-CM | POA: Insufficient documentation

## 2015-01-09 DIAGNOSIS — I10 Essential (primary) hypertension: Secondary | ICD-10-CM | POA: Insufficient documentation

## 2015-01-09 DIAGNOSIS — Z8781 Personal history of (healed) traumatic fracture: Secondary | ICD-10-CM | POA: Insufficient documentation

## 2015-01-09 MED ORDER — HYDROCODONE-ACETAMINOPHEN 5-325 MG PO TABS: 1.0000 | ORAL_TABLET | Freq: Four times a day (QID) | ORAL | Status: DC | PRN

## 2015-01-09 MED ORDER — HYDROCHLOROTHIAZIDE 25 MG PO TABS
25.0000 mg | ORAL_TABLET | Freq: Once | ORAL | Status: AC
  Administered 2015-01-09: 25 mg via ORAL
  Filled 2015-01-09: qty 1

## 2015-01-09 MED ORDER — METOPROLOL TARTRATE 25 MG PO TABS
25.0000 mg | ORAL_TABLET | Freq: Once | ORAL | Status: AC
  Administered 2015-01-09: 25 mg via ORAL
  Filled 2015-01-09: qty 1

## 2015-01-09 MED ORDER — HYDROCHLOROTHIAZIDE 25 MG PO TABS: 25.0000 mg | ORAL_TABLET | Freq: Every day | ORAL | Status: DC

## 2015-01-09 MED ORDER — AMLODIPINE BESYLATE 10 MG PO TABS: 10.0000 mg | ORAL_TABLET | Freq: Every day | ORAL | Status: DC

## 2015-01-09 MED ORDER — METOPROLOL TARTRATE 25 MG PO TABS: 25.0000 mg | ORAL_TABLET | Freq: Two times a day (BID) | ORAL | Status: DC

## 2015-01-09 MED ORDER — KETOROLAC TROMETHAMINE 60 MG/2ML IM SOLN
60.0000 mg | Freq: Once | INTRAMUSCULAR | Status: AC
  Administered 2015-01-09: 60 mg via INTRAMUSCULAR
  Filled 2015-01-09: qty 2

## 2015-01-09 NOTE — Discharge Instructions (Signed)
1. Medications: Percocet for severe pain, ibuprofen for mild to moderate pain, usual home medications including amlodipine, metoprolol and HCTZ all refill today 2. Treatment: rest, drink plenty of fluids, wear sling, ice elbow, no weight lifting until cleared by orthopedics 3. Follow Up: Please followup with the [facility] for discussion of your diagnoses and further evaluation after today's visit; Please return to the ER for worsening symptoms   Elbow Contusion An elbow contusion is a deep bruise of the elbow. Contusions are the result of an injury that caused bleeding under the skin. The contusion may turn blue, purple, or yellow. Minor injuries will give you a painless contusion, but more severe contusions may stay painful and swollen for a few weeks.  CAUSES  An elbow contusion comes from a direct force to that area, such as falling on the elbow. SYMPTOMS   Swelling and redness of the elbow.  Bruising of the elbow area.  Tenderness or soreness of the elbow. DIAGNOSIS  You will have a physical exam and will be asked about your history. You may need an X-ray of your elbow to look for a broken bone (fracture).  TREATMENT  A sling or splint may be needed to support your injury. Resting, elevating, and applying cold compresses to the elbow area are often the best treatments for an elbow contusion. Over-the-counter medicines may also be recommended for pain control. HOME CARE INSTRUCTIONS   Put ice on the injured area.  Put ice in a plastic bag.  Place a towel between your skin and the bag.  Leave the ice on for 15-20 minutes, 03-04 times a day.  Only take over-the-counter or prescription medicines for pain, discomfort, or fever as directed by your caregiver.  Rest your injured elbow until the pain and swelling are better.  Elevate your elbow to reduce swelling.  Apply a compression wrap as directed by your caregiver. This can help reduce swelling and motion. You may  remove the wrap for sleeping, showers, and baths. If your fingers become numb, cold, or blue, take the wrap off and reapply it more loosely.  Use your elbow only as directed by your caregiver. You may be asked to do range of motion exercises. Do them as directed.  See your caregiver as directed. It is very important to keep all follow-up appointments in order to avoid any long-term problems with your elbow, including chronic pain or inability to move your elbow normally. SEEK IMMEDIATE MEDICAL CARE IF:   You have increased redness, swelling, or pain in your elbow.  Your swelling or pain is not relieved with medicines.  You have swelling of the hand and fingers.  You are unable to move your fingers or wrist.  You begin to lose feeling in your hand or fingers.  Your fingers or hand become cold or blue. MAKE SURE YOU:   Understand these instructions.  Will watch your condition.  Will get help right away if you are not doing well or get worse. Document Released: 05/26/2006 Document Revised: 09/09/2011 Document Reviewed: 05/03/2011 [facility] Patient Information 2015 [place], [place]. This information is not intended to replace advice given to you by your health care provider. Make sure you discuss any questions you have with your health care provider.

## 2015-01-09 NOTE — ED Notes (Addendum)
Pt was at the gym and the weight fell of the rack onto his R elbow. He has pain and swelling since. Cms intact. He also states he ran out of his daily medications because he can not afford them

## 2015-01-09 NOTE — Care Management Note (Signed)
Case Management Note  Patient Details  Name: Corey Guerrero MRN: [id number] Date of Birth: Oct 19, 1955  Subjective/Objective:                  Patient presented to [facility] ED with c/o arm pain after dropping a bar bell onto his arm, and needing b/p medication refill.  Action/Plan: PCP referral for follow up  Expected Discharge Date:   01/09/15               Expected Discharge Plan:     In-House Referral:     Discharge planning Services     Follow up at the [facility] for Primary Care needs  Post Acute Care Choice:    Choice offered to:     DME Arranged:    DME Agency:     HH Arranged:    HH Agency:     Status of Service:   Completed  Medicare Important Message Given:    Date Medicare IM Given:    Medicare IM give by:    Date Additional Medicare IM Given:    Additional Medicare Important Message give by:     If discussed at [place] of Stay Meetings, dates discussed:    Additional Comments: ED CM was consulted by H. Mutherbaugh PA-C concerning patient needing follow-up patient has not taken b/p meds in over a 2 months.  Patient states, he was receiving care at the [facility] in [facility] but a couple of months ago moved to [place] and has not been able to establish care at the [facility]. Arranged a f/u appointment for 7/15 at 10:30a at the [facility] card information also provided, patient verbalizes understanding and appreciation for the assistance.  Updated H. [place] PA-C and Alfredo Martinez on discharge plan. No further ED CM needs identified.   Laurena Slimmer, RN 01/09/2015, 7:39 PM
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
## 2015-01-13 ENCOUNTER — Ambulatory Visit: Payer: Self-pay | Attending: Family Medicine | Admitting: Family Medicine

## 2015-01-13 ENCOUNTER — Encounter: Payer: Self-pay | Admitting: Family Medicine

## 2015-01-13 VITALS — BP 160/110 | HR 55 | Temp 98.0°F | Ht 66.0 in | Wt 171.0 lb

## 2015-01-13 DIAGNOSIS — N179 Acute kidney failure, unspecified: Secondary | ICD-10-CM

## 2015-01-13 DIAGNOSIS — M79621 Pain in right upper arm: Secondary | ICD-10-CM

## 2015-01-13 DIAGNOSIS — I10 Essential (primary) hypertension: Secondary | ICD-10-CM

## 2015-01-13 DIAGNOSIS — N189 Chronic kidney disease, unspecified: Secondary | ICD-10-CM

## 2015-01-13 DIAGNOSIS — M25521 Pain in right elbow: Secondary | ICD-10-CM

## 2015-01-13 LAB — BASIC METABOLIC PANEL
BUN: 27 mg/dL — ABNORMAL HIGH (ref 6–23)
CO2: 34 mEq/L — ABNORMAL HIGH (ref 19–32)
Calcium: 9.6 mg/dL (ref 8.4–10.5)
Chloride: 98 mEq/L (ref 96–112)
Creat: 1.84 mg/dL — ABNORMAL HIGH (ref 0.50–1.35)
Glucose, Bld: 103 mg/dL — ABNORMAL HIGH (ref 70–99)
Potassium: 3.4 mEq/L — ABNORMAL LOW (ref 3.5–5.3)
Sodium: 142 mEq/L (ref 135–145)

## 2015-01-13 MED ORDER — TRAMADOL HCL 50 MG PO TABS: 50.0000 mg | ORAL_TABLET | Freq: Three times a day (TID) | ORAL | Status: DC | PRN

## 2015-01-13 MED ORDER — LABETALOL HCL 200 MG PO TABS: 200.0000 mg | ORAL_TABLET | Freq: Two times a day (BID) | ORAL | Status: DC
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
## 2015-01-13 NOTE — Progress Notes (Signed)
Patient here for follow up on dropping a 150 lb weight on his right shoulder He has been taking the hydrocodone for pain Pain is 5 out of 10 today but he is having trouble with ADL's

## 2015-01-13 NOTE — Patient Instructions (Signed)

## 2015-01-17 ENCOUNTER — Telehealth: Payer: Self-pay | Admitting: *Deleted

## 2015-01-17 NOTE — Telephone Encounter (Signed)
-----   Message from Arnoldo Morale, MD sent at 01/16/2015  1:11 PM EDT ----- Please inform him his kidney function is abnormal. Advised to refrain from using NSAIDS. Potassium is also low, increase intake of Potassium rich foods.

## 2015-01-17 NOTE — Telephone Encounter (Signed)
Verified name and date of birth and went over results of labs.  Discussed a list of potassium rich foods such as bananas, spinach, yogurt, milk, sweet potatoes, winter squash.Marland KitchenMarland Kitchen

## 2015-01-19 ENCOUNTER — Encounter: Payer: Self-pay | Admitting: Family Medicine

## 2015-01-19 ENCOUNTER — Ambulatory Visit: Payer: Self-pay | Attending: Family Medicine | Admitting: Family Medicine

## 2015-01-19 ENCOUNTER — Ambulatory Visit (HOSPITAL_COMMUNITY)
Admission: RE | Admit: 2015-01-19 | Discharge: 2015-01-19 | Disposition: A | Discharge status: Home / Self Care | Payer: Self-pay | Source: Ambulatory Visit | Attending: Family Medicine | Admitting: Family Medicine

## 2015-01-19 VITALS — BP 142/88 | HR 45 | Temp 98.4°F | Ht 66.0 in | Wt 175.0 lb

## 2015-01-19 DIAGNOSIS — N529 Male erectile dysfunction, unspecified: Secondary | ICD-10-CM | POA: Insufficient documentation

## 2015-01-19 DIAGNOSIS — M7021 Olecranon bursitis, right elbow: Secondary | ICD-10-CM

## 2015-01-19 DIAGNOSIS — M25519 Pain in unspecified shoulder: Secondary | ICD-10-CM | POA: Insufficient documentation

## 2015-01-19 DIAGNOSIS — E876 Hypokalemia: Secondary | ICD-10-CM

## 2015-01-19 DIAGNOSIS — M25521 Pain in right elbow: Secondary | ICD-10-CM | POA: Insufficient documentation

## 2015-01-19 DIAGNOSIS — N521 Erectile dysfunction due to diseases classified elsewhere: Secondary | ICD-10-CM

## 2015-01-19 DIAGNOSIS — N182 Chronic kidney disease, stage 2 (mild): Secondary | ICD-10-CM

## 2015-01-19 DIAGNOSIS — M25511 Pain in right shoulder: Secondary | ICD-10-CM

## 2015-01-19 MED ORDER — METOPROLOL TARTRATE 25 MG PO TABS: 25.0000 mg | ORAL_TABLET | Freq: Two times a day (BID) | ORAL | Status: DC

## 2015-01-19 MED ORDER — CYCLOBENZAPRINE HCL 10 MG PO TABS: 10.0000 mg | ORAL_TABLET | Freq: Two times a day (BID) | ORAL | Status: DC | PRN

## 2015-01-19 MED ORDER — PREDNISONE 10 MG PO TABS: 10.0000 mg | ORAL_TABLET | Freq: Every day | ORAL | Status: DC

## 2015-01-19 MED ORDER — POTASSIUM CHLORIDE ER 10 MEQ PO TBCR: 10.0000 meq | EXTENDED_RELEASE_TABLET | Freq: Every day | ORAL | Status: DC

## 2015-01-19 MED ORDER — SILDENAFIL CITRATE 100 MG PO TABS: 100.0000 mg | ORAL_TABLET | Freq: Every day | ORAL | Status: DC | PRN

## 2015-01-19 NOTE — Patient Instructions (Signed)
Olecranon Bursitis Bursitis is swelling and soreness (inflammation) of a fluid-filled sac (bursa) that covers and protects a joint. Olecranon bursitis occurs over the elbow.  CAUSES Bursitis can be caused by injury, overuse of the joint, arthritis, or infection.  SYMPTOMS   Tenderness, swelling, warmth, or redness over the elbow.  Elbow pain with movement. This is greater with bending the elbow.  Squeaking sound when the bursa is rubbed or moved.  Increasing size of the bursa without pain or discomfort.  Fever with increasing pain and swelling if the bursa becomes infected. HOME CARE INSTRUCTIONS   Put ice on the affected area.  Put ice in a plastic bag.  Place a towel between your skin and the bag.  Leave the ice on for 15-20 minutes each hour while awake. Do this for the first 2 days.  When resting, elevate your elbow above the level of your heart. This helps reduce swelling.  Continue to put the joint through a full range of motion 4 times per day. Rest the injured joint at other times. When the pain lessens, begin normal slow movements and usual activities.  Only take over-the-counter or prescription medicines for pain, discomfort, or fever as directed by your caregiver.  Reduce your intake of milk and related dairy products (cheese, yogurt). They may make your condition worse. SEEK IMMEDIATE MEDICAL CARE IF:   Your pain increases even during treatment.  You have a fever.  You have heat and inflammation over the bursa and elbow.  You have a red line that goes up your arm.  You have pain with movement of your elbow. MAKE SURE YOU:   Understand these instructions.  Will watch your condition.  Will get help right away if you are not doing well or get worse. Document Released: 07/17/2006 Document Revised: 09/09/2011 Document Reviewed: 06/02/2007 [employer] Patient Information 2015 [employer]. This information is not intended to replace advice given to you by your  health care provider. Make sure you discuss any questions you have with your health care provider.  

## 2015-01-19 NOTE — Progress Notes (Signed)
Patient here to follow up on right arm and shoulder pain Pain is 4/10 constant and patient cannot lift anything with his arm He never got his Tramadol filled Swelling noted at the elbow
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
## 2015-01-26 ENCOUNTER — Ambulatory Visit: Payer: Self-pay | Attending: Family Medicine | Admitting: Family Medicine

## 2015-01-26 ENCOUNTER — Encounter: Payer: Self-pay | Admitting: Family Medicine

## 2015-01-26 VITALS — BP 148/94 | HR 50 | Temp 97.5°F | Resp 18 | Ht 66.0 in | Wt 179.2 lb

## 2015-01-26 DIAGNOSIS — I129 Hypertensive chronic kidney disease with stage 1 through stage 4 chronic kidney disease, or unspecified chronic kidney disease: Secondary | ICD-10-CM | POA: Insufficient documentation

## 2015-01-26 DIAGNOSIS — M7021 Olecranon bursitis, right elbow: Secondary | ICD-10-CM

## 2015-01-26 DIAGNOSIS — N189 Chronic kidney disease, unspecified: Secondary | ICD-10-CM | POA: Insufficient documentation

## 2015-01-26 DIAGNOSIS — K219 Gastro-esophageal reflux disease without esophagitis: Secondary | ICD-10-CM | POA: Insufficient documentation

## 2015-01-26 DIAGNOSIS — Z7952 Long term (current) use of systemic steroids: Secondary | ICD-10-CM | POA: Insufficient documentation

## 2015-01-26 DIAGNOSIS — N529 Male erectile dysfunction, unspecified: Secondary | ICD-10-CM | POA: Insufficient documentation

## 2015-01-26 DIAGNOSIS — R42 Dizziness and giddiness: Secondary | ICD-10-CM

## 2015-01-26 DIAGNOSIS — N182 Chronic kidney disease, stage 2 (mild): Secondary | ICD-10-CM

## 2015-01-26 DIAGNOSIS — Z79899 Other long term (current) drug therapy: Secondary | ICD-10-CM | POA: Insufficient documentation

## 2015-01-26 DIAGNOSIS — M25511 Pain in right shoulder: Secondary | ICD-10-CM

## 2015-01-26 DIAGNOSIS — I10 Essential (primary) hypertension: Secondary | ICD-10-CM

## 2015-01-26 MED ORDER — SILDENAFIL CITRATE 100 MG PO TABS: 100.0000 mg | ORAL_TABLET | Freq: Every day | ORAL | Status: DC | PRN

## 2015-01-26 MED ORDER — ASPIRIN 81 MG PO CHEW: 81.0000 mg | CHEWABLE_TABLET | Freq: Every day | ORAL | Status: DC

## 2015-01-26 NOTE — Progress Notes (Signed)
Patient here for follow up for right elbow injury.  Patient has difficulty pulling/pushing anything because it sends shooting pains in his arm. Patient rates pain as constant 3/10.  Patient reports pain in elbow, shoulder, and neck, all related to injury.  Patient reports some of the medications "do not agree" with him, and that he does not like taking steroids.  Patient reports dizziness with the medications, but he thinks it is because he did not eat when he took it.   Patient reports work related stress causing him lack of sleep.  Patient does not want anything medication-wise for sleep, states it makes him stop breathing.  Patient requests a note for work because of this appointment.    Patient requests refill on Viagra.
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
## 2015-01-30 ENCOUNTER — Telehealth: Payer: Self-pay | Admitting: *Deleted

## 2015-01-30 NOTE — Telephone Encounter (Signed)
Spoke to patient and verified name and date of birth.  Gave patient results and patient verbalized understanding.

## 2015-01-30 NOTE — Telephone Encounter (Signed)
-----   Message from Arnoldo Morale, MD sent at 01/20/2015  8:31 AM EDT ----- Odette Horns reveals olecranon bursitis which i am already treating him for. Advised to comply with medications and i will see him back at his next office visit.

## 2015-01-30 NOTE — Telephone Encounter (Signed)
-----   Message from Arnoldo Morale, MD sent at 01/20/2015  8:31 AM EDT ----- Corey Guerrero reveals olecranon bursitis which i am already treating him for. Advised to comply with medications and i will see him back at his next office visit.

## 2015-02-03 ENCOUNTER — Emergency Department (HOSPITAL_COMMUNITY): Payer: Self-pay

## 2015-02-03 ENCOUNTER — Emergency Department (HOSPITAL_COMMUNITY)
Admission: EM | Admit: 2015-02-03 | Discharge: 2015-02-03 | Disposition: A | Discharge status: Home / Self Care | Payer: Self-pay | Attending: Emergency Medicine | Admitting: Emergency Medicine

## 2015-02-03 ENCOUNTER — Encounter (HOSPITAL_COMMUNITY): Payer: Self-pay | Admitting: Emergency Medicine

## 2015-02-03 DIAGNOSIS — M7021 Olecranon bursitis, right elbow: Secondary | ICD-10-CM | POA: Insufficient documentation

## 2015-02-03 DIAGNOSIS — Z7982 Long term (current) use of aspirin: Secondary | ICD-10-CM | POA: Insufficient documentation

## 2015-02-03 DIAGNOSIS — I10 Essential (primary) hypertension: Secondary | ICD-10-CM | POA: Insufficient documentation

## 2015-02-03 DIAGNOSIS — Z79899 Other long term (current) drug therapy: Secondary | ICD-10-CM | POA: Insufficient documentation

## 2015-02-03 DIAGNOSIS — Y9389 Activity, other specified: Secondary | ICD-10-CM | POA: Insufficient documentation

## 2015-02-03 DIAGNOSIS — Z8781 Personal history of (healed) traumatic fracture: Secondary | ICD-10-CM | POA: Insufficient documentation

## 2015-02-03 MED ORDER — TRAMADOL HCL 50 MG PO TABS: 50.0000 mg | ORAL_TABLET | Freq: Three times a day (TID) | ORAL | Status: DC | PRN

## 2015-02-03 MED ORDER — MELOXICAM 15 MG PO TABS: 15.0000 mg | ORAL_TABLET | Freq: Every day | ORAL | Status: DC

## 2015-02-03 MED ORDER — METHYLPREDNISOLONE ACETATE 80 MG/ML IJ SUSP
80.0000 mg | Freq: Once | INTRAMUSCULAR | Status: AC
  Administered 2015-02-03: 80 mg via INTRAMUSCULAR
  Filled 2015-02-03: qty 1

## 2015-02-03 NOTE — Discharge Instructions (Signed)
Bursitis Bursitis is when the fluid-filled sac (bursa) that covers and protects a joint gets puffy and irritated. The elbow, shoulder, hip, and knee joints are most often affected. HOME CARE  Put ice on the area.  Put ice in a plastic bag.  Place a towel between your skin and the bag.  Leave the ice on for 15-20 minutes, 03-04 times a day.  Put the joint through a full range of motion 4 times a day. Rest the injured joint at other times. When you have less pain, begin slow movements and usual activities.  Only take medicine as told by your doctor.  Follow up with your doctor. Any delay in care could stop the bursitis from healing. This could cause long-term pain. GET HELP RIGHT AWAY IF:   You have more pain with treatment.  You have a temperature by mouth above 102 F (38.9 C), not controlled by medicine.  You have heat and irritation over the fluid-filled sac. MAKE SURE YOU:   Understand these instructions.  Will watch your condition.  Will get help right away if you are not doing well or get worse. Document Released: 12/05/2009 Document Revised: 09/09/2011 Document Reviewed: 09/06/2013 [facility] Patient Information 2015 [place], [place]. This information is not intended to replace advice given to you by your health care provider. Make sure you discuss any questions you have with your health care provider.

## 2015-02-03 NOTE — ED Notes (Addendum)
Pt sts right elbow pain with swelling noted after he fell onto yesterday
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
## 2015-02-09 ENCOUNTER — Ambulatory Visit: Payer: Self-pay | Attending: Family Medicine | Admitting: Family Medicine

## 2015-02-09 ENCOUNTER — Encounter: Payer: Self-pay | Admitting: Family Medicine

## 2015-02-09 VITALS — BP 148/98 | HR 54 | Temp 98.5°F | Ht 66.0 in | Wt 179.0 lb

## 2015-02-09 DIAGNOSIS — I129 Hypertensive chronic kidney disease with stage 1 through stage 4 chronic kidney disease, or unspecified chronic kidney disease: Secondary | ICD-10-CM | POA: Insufficient documentation

## 2015-02-09 DIAGNOSIS — Z79899 Other long term (current) drug therapy: Secondary | ICD-10-CM | POA: Insufficient documentation

## 2015-02-09 DIAGNOSIS — N182 Chronic kidney disease, stage 2 (mild): Secondary | ICD-10-CM

## 2015-02-09 DIAGNOSIS — Z7982 Long term (current) use of aspirin: Secondary | ICD-10-CM | POA: Insufficient documentation

## 2015-02-09 DIAGNOSIS — W19XXXA Unspecified fall, initial encounter: Secondary | ICD-10-CM | POA: Insufficient documentation

## 2015-02-09 DIAGNOSIS — N189 Chronic kidney disease, unspecified: Secondary | ICD-10-CM | POA: Insufficient documentation

## 2015-02-09 DIAGNOSIS — M7021 Olecranon bursitis, right elbow: Secondary | ICD-10-CM | POA: Insufficient documentation

## 2015-02-09 DIAGNOSIS — K219 Gastro-esophageal reflux disease without esophagitis: Secondary | ICD-10-CM | POA: Insufficient documentation

## 2015-02-09 DIAGNOSIS — Z7952 Long term (current) use of systemic steroids: Secondary | ICD-10-CM | POA: Insufficient documentation

## 2015-02-09 MED ORDER — AMLODIPINE BESYLATE 10 MG PO TABS: 10.0000 mg | ORAL_TABLET | Freq: Every day | ORAL | Status: DC

## 2015-02-09 MED ORDER — HYDROCHLOROTHIAZIDE 25 MG PO TABS: 25.0000 mg | ORAL_TABLET | Freq: Every day | ORAL | Status: DC

## 2015-02-09 MED ORDER — METOPROLOL TARTRATE 25 MG PO TABS: 25.0000 mg | ORAL_TABLET | Freq: Two times a day (BID) | ORAL | Status: DC

## 2015-02-09 NOTE — Patient Instructions (Signed)
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
## 2015-02-09 NOTE — Progress Notes (Signed)
F/u on his olecranon bursitis of the right elbow Patient seen in ED on 8/5 after falling on his right elbow Given steroid injection and Rx for Tramadol and Meloxicam that he states he needs to pick up at our pharmacy He needs refills of his blood pressure medications but did take them this am

## 2015-03-15 ENCOUNTER — Encounter: Payer: Self-pay | Admitting: Family Medicine

## 2015-03-15 ENCOUNTER — Ambulatory Visit: Payer: No Typology Code available for payment source | Attending: Family Medicine | Admitting: Family Medicine

## 2015-03-15 VITALS — BP 157/82 | HR 51 | Temp 98.0°F | Resp 16 | Ht 66.0 in | Wt 175.0 lb

## 2015-03-15 DIAGNOSIS — I10 Essential (primary) hypertension: Secondary | ICD-10-CM | POA: Insufficient documentation

## 2015-03-15 DIAGNOSIS — M7021 Olecranon bursitis, right elbow: Secondary | ICD-10-CM | POA: Insufficient documentation

## 2015-03-15 DIAGNOSIS — E876 Hypokalemia: Secondary | ICD-10-CM | POA: Insufficient documentation

## 2015-03-15 LAB — BASIC METABOLIC PANEL
BUN: 22 mg/dL (ref 7–25)
CO2: 32 mmol/L — ABNORMAL HIGH (ref 20–31)
Calcium: 9.7 mg/dL (ref 8.6–10.3)
Chloride: 100 mmol/L (ref 98–110)
Creat: 1.72 mg/dL — ABNORMAL HIGH (ref 0.70–1.33)
Glucose, Bld: 91 mg/dL (ref 65–99)
Potassium: 3.8 mmol/L (ref 3.5–5.3)
Sodium: 139 mmol/L (ref 135–146)

## 2015-03-15 MED ORDER — HYDROCHLOROTHIAZIDE 25 MG PO TABS: 25.0000 mg | ORAL_TABLET | Freq: Every day | ORAL | Status: DC

## 2015-03-15 MED ORDER — ASPIRIN 81 MG PO CHEW: 81.0000 mg | CHEWABLE_TABLET | Freq: Every day | ORAL | Status: DC

## 2015-03-15 MED ORDER — DICLOFENAC SODIUM 1 % TD GEL: 2.0000 g | Freq: Four times a day (QID) | TRANSDERMAL | Status: DC

## 2015-03-15 MED ORDER — METOPROLOL TARTRATE 25 MG PO TABS: 25.0000 mg | ORAL_TABLET | Freq: Two times a day (BID) | ORAL | Status: DC

## 2015-03-15 MED ORDER — AMLODIPINE BESYLATE 10 MG PO TABS: 10.0000 mg | ORAL_TABLET | Freq: Every day | ORAL | Status: DC

## 2015-03-15 NOTE — Progress Notes (Signed)
Establish  Care  Complaining of rt arm sore since July  Pain scale #2 Unable to left wt

## 2015-03-15 NOTE — Patient Instructions (Signed)
Mr. Mackert,  Thank you for coming in today. It was a pleasure meeting you. I look forward to being your primary doctor.   1. HTN: Goal BP is < 140/90 Refilled HCTZ  2. Olecranon bursitis vs tendinitis voltaren gel ordered   3. Low potassium: checking level today  4. Poor vision: likely due to hypertension Referral to opthalmology placed  Refilled aspirin   F/u in 3 week for BP check F/u for flu shot F.u with me in 6-8 weeks for HTN and R elbow pain   Dr. Adrian Blackwater
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
## 2015-03-16 ENCOUNTER — Telehealth: Payer: Self-pay | Admitting: *Deleted

## 2015-03-16 NOTE — Telephone Encounter (Signed)
LVM to return call.

## 2015-03-16 NOTE — Telephone Encounter (Signed)
-----   Message from Boykin Nearing, MD sent at 03/16/2015  9:15 AM EDT ----- Stable, Cr Normal potassium no need for supplement at this time

## 2015-04-05 ENCOUNTER — Emergency Department (HOSPITAL_COMMUNITY): Payer: Self-pay

## 2015-04-05 ENCOUNTER — Encounter (HOSPITAL_COMMUNITY): Payer: Self-pay

## 2015-04-05 ENCOUNTER — Emergency Department (HOSPITAL_COMMUNITY)
Admission: EM | Admit: 2015-04-05 | Discharge: 2015-04-06 | Disposition: A | Discharge status: Home / Self Care | Payer: Self-pay | Attending: Emergency Medicine | Admitting: Emergency Medicine

## 2015-04-05 DIAGNOSIS — Z791 Long term (current) use of non-steroidal anti-inflammatories (NSAID): Secondary | ICD-10-CM | POA: Insufficient documentation

## 2015-04-05 DIAGNOSIS — Z7982 Long term (current) use of aspirin: Secondary | ICD-10-CM | POA: Insufficient documentation

## 2015-04-05 DIAGNOSIS — Z79899 Other long term (current) drug therapy: Secondary | ICD-10-CM | POA: Insufficient documentation

## 2015-04-05 DIAGNOSIS — R079 Chest pain, unspecified: Secondary | ICD-10-CM | POA: Insufficient documentation

## 2015-04-05 DIAGNOSIS — Z8719 Personal history of other diseases of the digestive system: Secondary | ICD-10-CM | POA: Insufficient documentation

## 2015-04-05 DIAGNOSIS — I10 Essential (primary) hypertension: Secondary | ICD-10-CM | POA: Insufficient documentation

## 2015-04-05 DIAGNOSIS — M5442 Lumbago with sciatica, left side: Secondary | ICD-10-CM | POA: Insufficient documentation

## 2015-04-05 LAB — CBC
HCT: 43 % (ref 39.0–52.0)
Hemoglobin: 15 g/dL (ref 13.0–17.0)
MCH: 32 pg (ref 26.0–34.0)
MCHC: 34.9 g/dL (ref 30.0–36.0)
MCV: 91.7 fL (ref 78.0–100.0)
Platelets: 489 10*3/uL — ABNORMAL HIGH (ref 150–400)
RBC: 4.69 MIL/uL (ref 4.22–5.81)
RDW: 13.3 % (ref 11.5–15.5)
WBC: 6.1 10*3/uL (ref 4.0–10.5)

## 2015-04-05 LAB — BASIC METABOLIC PANEL
Anion gap: 11 (ref 5–15)
BUN: 23 mg/dL — ABNORMAL HIGH (ref 6–20)
CO2: 32 mmol/L (ref 22–32)
Calcium: 9.3 mg/dL (ref 8.9–10.3)
Chloride: 94 mmol/L — ABNORMAL LOW (ref 101–111)
Creatinine, Ser: 1.97 mg/dL — ABNORMAL HIGH (ref 0.61–1.24)
GFR calc Af Amer: 41 mL/min — ABNORMAL LOW (ref >60)
GFR calc non Af Amer: 36 mL/min — ABNORMAL LOW (ref >60)
Glucose, Bld: 107 mg/dL — ABNORMAL HIGH (ref 65–99)
Potassium: 2.9 mmol/L — ABNORMAL LOW (ref 3.5–5.1)
Sodium: 137 mmol/L (ref 135–145)

## 2015-04-05 LAB — I-STAT TROPONIN, ED
Troponin i, poc: 0.01 ng/mL (ref 0.00–0.08)
Troponin i, poc: 0.01 ng/mL (ref 0.00–0.08)

## 2015-04-05 MED ORDER — MORPHINE SULFATE (PF) 4 MG/ML IV SOLN
4.0000 mg | Freq: Once | INTRAVENOUS | Status: AC
  Administered 2015-04-05: 4 mg via INTRAVENOUS
  Filled 2015-04-05: qty 1

## 2015-04-05 MED ORDER — CYCLOBENZAPRINE HCL 10 MG PO TABS
10.0000 mg | ORAL_TABLET | Freq: Once | ORAL | Status: AC
  Administered 2015-04-05: 10 mg via ORAL
  Filled 2015-04-05: qty 1

## 2015-04-05 MED ORDER — POTASSIUM CHLORIDE CRYS ER 20 MEQ PO TBCR
40.0000 meq | EXTENDED_RELEASE_TABLET | Freq: Once | ORAL | Status: AC
  Administered 2015-04-05: 40 meq via ORAL
  Filled 2015-04-05: qty 2

## 2015-04-05 MED ORDER — CYCLOBENZAPRINE HCL 10 MG PO TABS: 10.0000 mg | ORAL_TABLET | Freq: Two times a day (BID) | ORAL | Status: DC | PRN
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
## 2015-04-05 NOTE — Discharge Instructions (Signed)
Sciatica With Rehab The sciatic nerve runs from the back down the leg and is responsible for sensation and control of the muscles in the back (posterior) side of the thigh, lower leg, and foot. Sciatica is a condition that is characterized by inflammation of this nerve.  SYMPTOMS   Signs of nerve damage, including numbness and/or weakness along the posterior side of the lower extremity.  Pain in the back of the thigh that may also travel down the leg.  Pain that worsens when sitting for long periods of time.  Occasionally, pain in the back or buttock. CAUSES  Inflammation of the sciatic nerve is the cause of sciatica. The inflammation is due to something irritating the nerve. Common sources of irritation include:  Sitting for long periods of time.  Direct trauma to the nerve.  Arthritis of the spine.  Herniated or ruptured disk.  Slipping of the vertebrae (spondylolisthesis).  Pressure from soft tissues, such as muscles or ligament-like tissue (fascia). RISK INCREASES WITH:  Sports that place pressure or stress on the spine (football or weightlifting).  Poor strength and flexibility.  Failure to warm up properly before activity.  Family history of low back pain or disk disorders.  Previous back injury or surgery.  Poor body mechanics, especially when lifting, or poor posture. PREVENTION   Warm up and stretch properly before activity.  Maintain physical fitness:  Strength, flexibility, and endurance.  Cardiovascular fitness.  Learn and use proper technique, especially with posture and lifting. When possible, have coach correct improper technique.  Avoid activities that place stress on the spine. PROGNOSIS If treated properly, then sciatica usually resolves within 6 weeks. However, occasionally surgery is necessary.  RELATED COMPLICATIONS   Permanent nerve damage, including pain, numbness, tingle, or weakness.  Chronic back pain.  Risks of surgery: infection,  bleeding, nerve damage, or damage to surrounding tissues. TREATMENT Treatment initially involves resting from any activities that aggravate your symptoms. The use of ice and medication may help reduce pain and inflammation. The use of strengthening and stretching exercises may help reduce pain with activity. These exercises may be performed at home or with referral to a therapist. A therapist may recommend further treatments, such as transcutaneous electronic nerve stimulation (TENS) or ultrasound. Your caregiver may recommend corticosteroid injections to help reduce inflammation of the sciatic nerve. If symptoms persist despite non-surgical (conservative) treatment, then surgery may be recommended. MEDICATION  If pain medication is necessary, then nonsteroidal anti-inflammatory medications, such as aspirin and ibuprofen, or other minor pain relievers, such as acetaminophen, are often recommended.  Do not take pain medication for 7 days before surgery.  Prescription pain relievers may be given if deemed necessary by your caregiver. Use only as directed and only as much as you need.  Ointments applied to the skin may be helpful.  Corticosteroid injections may be given by your caregiver. These injections should be reserved for the most serious cases, because they may only be given a certain number of times. HEAT AND COLD  Cold treatment (icing) relieves pain and reduces inflammation. Cold treatment should be applied for 10 to 15 minutes every 2 to 3 hours for inflammation and pain and immediately after any activity that aggravates your symptoms. Use ice packs or massage the area with a piece of ice (ice massage).  Heat treatment may be used prior to performing the stretching and strengthening activities prescribed by your caregiver, physical therapist, or athletic trainer. Use a heat pack or soak the injury in warm water.  SEEK MEDICAL CARE IF:  Treatment seems to offer no benefit, or the condition  worsens.  Any medications produce adverse side effects. EXERCISES  RANGE OF MOTION (ROM) AND STRETCHING EXERCISES - Sciatica Most people with sciatic will find that their symptoms worsen with either excessive bending forward (flexion) or arching at the low back (extension). The exercises which will help resolve your symptoms will focus on the opposite motion. Your physician, physical therapist or athletic trainer will help you determine which exercises will be most helpful to resolve your low back pain. Do not complete any exercises without first consulting with your clinician. Discontinue any exercises which worsen your symptoms until you speak to your clinician. If you have pain, numbness or tingling which travels down into your buttocks, leg or foot, the goal of the therapy is for these symptoms to move closer to your back and eventually resolve. Occasionally, these leg symptoms will get better, but your low back pain may worsen; this is typically an indication of progress in your rehabilitation. Be certain to be very alert to any changes in your symptoms and the activities in which you participated in the 24 hours prior to the change. Sharing this information with your clinician will allow him/her to most efficiently treat your condition. These exercises may help you when beginning to rehabilitate your injury. Your symptoms may resolve with or without further involvement from your physician, physical therapist or athletic trainer. While completing these exercises, remember:   Restoring tissue flexibility helps normal motion to return to the joints. This allows healthier, less painful movement and activity.  An effective stretch should be held for at least 30 seconds.  A stretch should never be painful. You should only feel a gentle lengthening or release in the stretched tissue. FLEXION RANGE OF MOTION AND STRETCHING EXERCISES: STRETCH - Flexion, Single Knee to Chest   Lie on a firm bed or floor  with both legs extended in front of you.  Keeping one leg in contact with the floor, bring your opposite knee to your chest. Hold your leg in place by either grabbing behind your thigh or at your knee.  Pull until you feel a gentle stretch in your low back. Hold __________ seconds.  Slowly release your grasp and repeat the exercise with the opposite side. Repeat __________ times. Complete this exercise __________ times per day.  STRETCH - Flexion, Double Knee to Chest  Lie on a firm bed or floor with both legs extended in front of you.  Keeping one leg in contact with the floor, bring your opposite knee to your chest.  Tense your stomach muscles to support your back and then lift your other knee to your chest. Hold your legs in place by either grabbing behind your thighs or at your knees.  Pull both knees toward your chest until you feel a gentle stretch in your low back. Hold __________ seconds.  Tense your stomach muscles and slowly return one leg at a time to the floor. Repeat __________ times. Complete this exercise __________ times per day.  STRETCH - Low Trunk Rotation   Lie on a firm bed or floor. Keeping your legs in front of you, bend your knees so they are both pointed toward the ceiling and your feet are flat on the floor.  Extend your arms out to the side. This will stabilize your upper body by keeping your shoulders in contact with the floor.  Gently and slowly drop both knees together to one side until  you feel a gentle stretch in your low back. Hold for __________ seconds.  Tense your stomach muscles to support your low back as you bring your knees back to the starting position. Repeat the exercise to the other side. Repeat __________ times. Complete this exercise __________ times per day  EXTENSION RANGE OF MOTION AND FLEXIBILITY EXERCISES: STRETCH - Extension, Prone on Elbows  Lie on your stomach on the floor, a bed will be too soft. Place your palms about shoulder  width apart and at the height of your head.  Place your elbows under your shoulders. If this is too painful, stack pillows under your chest.  Allow your body to relax so that your hips drop lower and make contact more completely with the floor.  Hold this position for __________ seconds.  Slowly return to lying flat on the floor. Repeat __________ times. Complete this exercise __________ times per day.  RANGE OF MOTION - Extension, Prone Press Ups  Lie on your stomach on the floor, a bed will be too soft. Place your palms about shoulder width apart and at the height of your head.  Keeping your back as relaxed as possible, slowly straighten your elbows while keeping your hips on the floor. You may adjust the placement of your hands to maximize your comfort. As you gain motion, your hands will come more underneath your shoulders.  Hold this position __________ seconds.  Slowly return to lying flat on the floor. Repeat __________ times. Complete this exercise __________ times per day.  STRENGTHENING EXERCISES - Sciatica  These exercises may help you when beginning to rehabilitate your injury. These exercises should be done near your "sweet spot." This is the neutral, low-back arch, somewhere between fully rounded and fully arched, that is your least painful position. When performed in this safe range of motion, these exercises can be used for people who have either a flexion or extension based injury. These exercises may resolve your symptoms with or without further involvement from your physician, physical therapist or athletic trainer. While completing these exercises, remember:   Muscles can gain both the endurance and the strength needed for everyday activities through controlled exercises.  Complete these exercises as instructed by your physician, physical therapist or athletic trainer. Progress with the resistance and repetition exercises only as your caregiver advises.  You may  experience muscle soreness or fatigue, but the pain or discomfort you are trying to eliminate should never worsen during these exercises. If this pain does worsen, stop and make certain you are following the directions exactly. If the pain is still present after adjustments, discontinue the exercise until you can discuss the trouble with your clinician. STRENGTHENING - Deep Abdominals, Pelvic Tilt   Lie on a firm bed or floor. Keeping your legs in front of you, bend your knees so they are both pointed toward the ceiling and your feet are flat on the floor.  Tense your lower abdominal muscles to press your low back into the floor. This motion will rotate your pelvis so that your tail bone is scooping upwards rather than pointing at your feet or into the floor.  With a gentle tension and even breathing, hold this position for __________ seconds. Repeat __________ times. Complete this exercise __________ times per day.  STRENGTHENING - Abdominals, Crunches   Lie on a firm bed or floor. Keeping your legs in front of you, bend your knees so they are both pointed toward the ceiling and your feet are flat on the  floor. Cross your arms over your chest.  Slightly tip your chin down without bending your neck.  Tense your abdominals and slowly lift your trunk high enough to just clear your shoulder blades. Lifting higher can put excessive stress on the low back and does not further strengthen your abdominal muscles.  Control your return to the starting position. Repeat __________ times. Complete this exercise __________ times per day.  STRENGTHENING - Quadruped, Opposite UE/LE Lift  Assume a hands and knees position on a firm surface. Keep your hands under your shoulders and your knees under your hips. You may place padding under your knees for comfort.  Find your neutral spine and gently tense your abdominal muscles so that you can maintain this position. Your shoulders and hips should form a rectangle  that is parallel with the floor and is not twisted.  Keeping your trunk steady, lift your right hand no higher than your shoulder and then your left leg no higher than your hip. Make sure you are not holding your breath. Hold this position __________ seconds.  Continuing to keep your abdominal muscles tense and your back steady, slowly return to your starting position. Repeat with the opposite arm and leg. Repeat __________ times. Complete this exercise __________ times per day.  STRENGTHENING - Abdominals and Quadriceps, Straight Leg Raise   Lie on a firm bed or floor with both legs extended in front of you.  Keeping one leg in contact with the floor, bend the other knee so that your foot can rest flat on the floor.  Find your neutral spine, and tense your abdominal muscles to maintain your spinal position throughout the exercise.  Slowly lift your straight leg off the floor about 6 inches for a count of 15, making sure to not hold your breath.  Still keeping your neutral spine, slowly lower your leg all the way to the floor. Repeat this exercise with each leg __________ times. Complete this exercise __________ times per day. POSTURE AND BODY MECHANICS CONSIDERATIONS - Sciatica Keeping correct posture when sitting, standing or completing your activities will reduce the stress put on different body tissues, allowing injured tissues a chance to heal and limiting painful experiences. The following are general guidelines for improved posture. Your physician or physical therapist will provide you with any instructions specific to your needs. While reading these guidelines, remember:  The exercises prescribed by your provider will help you have the flexibility and strength to maintain correct postures.  The correct posture provides the optimal environment for your joints to work. All of your joints have less wear and tear when properly supported by a spine with good posture. This means you will  experience a healthier, less painful body.  Correct posture must be practiced with all of your activities, especially prolonged sitting and standing. Correct posture is as important when doing repetitive low-stress activities (typing) as it is when doing a single heavy-load activity (lifting). RESTING POSITIONS Consider which positions are most painful for you when choosing a resting position. If you have pain with flexion-based activities (sitting, bending, stooping, squatting), choose a position that allows you to rest in a less flexed posture. You would want to avoid curling into a fetal position on your side. If your pain worsens with extension-based activities (prolonged standing, working overhead), avoid resting in an extended position such as sleeping on your stomach. Most people will find more comfort when they rest with their spine in a more neutral position, neither too rounded nor too  arched. Lying on a non-sagging bed on your side with a pillow between your knees, or on your back with a pillow under your knees will often provide some relief. Keep in mind, being in any one position for a prolonged period of time, no matter how correct your posture, can still lead to stiffness. PROPER SITTING POSTURE In order to minimize stress and discomfort on your spine, you must sit with correct posture Sitting with good posture should be effortless for a healthy body. Returning to good posture is a gradual process. Many people can work toward this most comfortably by using various supports until they have the flexibility and strength to maintain this posture on their own. When sitting with proper posture, your ears will fall over your shoulders and your shoulders will fall over your hips. You should use the back of the chair to support your upper back. Your low back will be in a neutral position, just slightly arched. You may place a small pillow or folded towel at the base of your low back for support.  When  working at a desk, create an environment that supports good, upright posture. Without extra support, muscles fatigue and lead to excessive strain on joints and other tissues. Keep these recommendations in mind: CHAIR:   A chair should be able to slide under your desk when your back makes contact with the back of the chair. This allows you to work closely.  The chair's height should allow your eyes to be level with the upper part of your monitor and your hands to be slightly lower than your elbows. BODY POSITION  Your feet should make contact with the floor. If this is not possible, use a foot rest.  Keep your ears over your shoulders. This will reduce stress on your neck and low back. INCORRECT SITTING POSTURES   If you are feeling tired and unable to assume a healthy sitting posture, do not slouch or slump. This puts excessive strain on your back tissues, causing more damage and pain. Healthier options include:  Using more support, like a lumbar pillow.  Switching tasks to something that requires you to be upright or walking.  Talking a brief walk.  Lying down to rest in a neutral-spine position. PROLONGED STANDING WHILE SLIGHTLY LEANING FORWARD  When completing a task that requires you to lean forward while standing in one place for a long time, place either foot up on a stationary 2-4 inch high object to help maintain the best posture. When both feet are on the ground, the low back tends to lose its slight inward curve. If this curve flattens (or becomes too large), then the back and your other joints will experience too much stress, fatigue more quickly and can cause pain.  CORRECT STANDING POSTURES Proper standing posture should be assumed with all daily activities, even if they only take a few moments, like when brushing your teeth. As in sitting, your ears should fall over your shoulders and your shoulders should fall over your hips. You should keep a slight tension in your abdominal  muscles to brace your spine. Your tailbone should point down to the ground, not behind your body, resulting in an over-extended swayback posture.  INCORRECT STANDING POSTURES  Common incorrect standing postures include a forward head, locked knees and/or an excessive swayback. WALKING Walk with an upright posture. Your ears, shoulders and hips should all line-up. PROLONGED ACTIVITY IN A FLEXED POSITION When completing a task that requires you to bend forward  at your waist or lean over a low surface, try to find a way to stabilize 3 of 4 of your limbs. You can place a hand or elbow on your thigh or rest a knee on the surface you are reaching across. This will provide you more stability so that your muscles do not fatigue as quickly. By keeping your knees relaxed, or slightly bent, you will also reduce stress across your low back. CORRECT LIFTING TECHNIQUES DO :   Assume a wide stance. This will provide you more stability and the opportunity to get as close as possible to the object which you are lifting.  Tense your abdominals to brace your spine; then bend at the knees and hips. Keeping your back locked in a neutral-spine position, lift using your leg muscles. Lift with your legs, keeping your back straight.  Test the weight of unknown objects before attempting to lift them.  Try to keep your elbows locked down at your sides in order get the best strength from your shoulders when carrying an object.  Always ask for help when lifting heavy or awkward objects. INCORRECT LIFTING TECHNIQUES DO NOT:   Lock your knees when lifting, even if it is a small object.  Bend and twist. Pivot at your feet or move your feet when needing to change directions.  Assume that you cannot safely pick up a paperclip without proper posture.   This information is not intended to replace advice given to you by your health care provider. Make sure you discuss any questions you have with your health care provider.     Document Released: 06/17/2005 Document Revised: 11/01/2014 Document Reviewed: 09/29/2008 [employer] Interactive Patient Education 2016 [employer] Nonspecific Chest Pain  Chest pain can be caused by many different conditions. There is always a chance that your pain could be related to something serious, such as a heart attack or a blood clot in your lungs. Chest pain can also be caused by conditions that are not life-threatening. If you have chest pain, it is very important to follow up with your health care provider. CAUSES  Chest pain can be caused by:  Heartburn.  Pneumonia or bronchitis.  Anxiety or stress.  Inflammation around your heart (pericarditis) or lung (pleuritis or pleurisy).  A blood clot in your lung.  A collapsed lung (pneumothorax). It can develop suddenly on its own (spontaneous pneumothorax) or from trauma to the chest.  Shingles infection (varicella-zoster virus).  Heart attack.  Damage to the bones, muscles, and cartilage that make up your chest wall. This can include:  Bruised bones due to injury.  Strained muscles or cartilage due to frequent or repeated coughing or overwork.  Fracture to one or more ribs.  Sore cartilage due to inflammation (costochondritis). RISK FACTORS  Risk factors for chest pain may include:  Activities that increase your risk for trauma or injury to your chest.  Respiratory infections or conditions that cause frequent coughing.  Medical conditions or overeating that can cause heartburn.  Heart disease or family history of heart disease.  Conditions or health behaviors that increase your risk of developing a blood clot.  Having had chicken pox (varicella zoster). SIGNS AND SYMPTOMS Chest pain can feel like:  Burning or tingling on the surface of your chest or deep in your chest.  Crushing, pressure, aching, or squeezing pain.  Dull or sharp pain that is worse when you move, cough, or take a deep breath.  Pain  that is also felt in your  back, neck, shoulder, or arm, or pain that spreads to any of these areas. Your chest pain may come and go, or it may stay constant. DIAGNOSIS Lab tests or other studies may be needed to find the cause of your pain. Your health care provider may have you take a test called an ambulatory ECG (electrocardiogram). An ECG records your heartbeat patterns at the time the test is performed. You may also have other tests, such as:  Transthoracic echocardiogram (TTE). During echocardiography, sound waves are used to create a picture of all of the heart structures and to look at how blood flows through your heart.  Transesophageal echocardiogram (TEE).This is a more advanced imaging test that obtains images from inside your body. It allows your health care provider to see your heart in finer detail.  Cardiac monitoring. This allows your health care provider to monitor your heart rate and rhythm in real time.  Holter monitor. This is a portable device that records your heartbeat and can help to diagnose abnormal heartbeats. It allows your health care provider to track your heart activity for several days, if needed.  Stress tests. These can be done through exercise or by taking medicine that makes your heart beat more quickly.  Blood tests.  Imaging tests. TREATMENT  Your treatment depends on what is causing your chest pain. Treatment may include:  Medicines. These may include:  Acid blockers for heartburn.  Anti-inflammatory medicine.  Pain medicine for inflammatory conditions.  Antibiotic medicine, if an infection is present.  Medicines to dissolve blood clots.  Medicines to treat coronary artery disease.  Supportive care for conditions that do not require medicines. This may include:  Resting.  Applying heat or cold packs to injured areas.  Limiting activities until pain decreases. HOME CARE INSTRUCTIONS  If you were prescribed an antibiotic medicine,  finish it all even if you start to feel better.  Avoid any activities that bring on chest pain.  Do not use any tobacco products, including cigarettes, chewing tobacco, or electronic cigarettes. If you need help quitting, ask your health care provider.  Do not drink alcohol.  Take medicines only as directed by your health care provider.  Keep all follow-up visits as directed by your health care provider. This is important. This includes any further testing if your chest pain does not go away.  If heartburn is the cause for your chest pain, you may be told to keep your head raised (elevated) while sleeping. This reduces the chance that acid will go from your stomach into your esophagus.  Make lifestyle changes as directed by your health care provider. These may include:  Getting regular exercise. Ask your health care provider to suggest some activities that are safe for you.  Eating a heart-healthy diet. A registered dietitian can help you to learn healthy eating options.  Maintaining a healthy weight.  Managing diabetes, if necessary.  Reducing stress. SEEK MEDICAL CARE IF:  Your chest pain does not go away after treatment.  You have a rash with blisters on your chest.  You have a fever. SEEK IMMEDIATE MEDICAL CARE IF:   Your chest pain is worse.  You have an increasing cough, or you cough up blood.  You have severe abdominal pain.  You have severe weakness.  You faint.  You have chills.  You have sudden, unexplained chest discomfort.  You have sudden, unexplained discomfort in your arms, back, neck, or jaw.  You have shortness of breath at any time.  You  suddenly start to sweat, or your skin gets clammy.  You feel nauseous or you vomit.  You suddenly feel light-headed or dizzy.  Your heart begins to beat quickly, or it feels like it is skipping beats. These symptoms may represent a serious problem that is an emergency. Do not wait to see if the symptoms  will go away. Get medical help right away. Call your local emergency services (911 in the [place] Do not drive yourself to the hospital.   This information is not intended to replace advice given to you by your health care provider. Make sure you discuss any questions you have with your health care provider.   Document Released: 03/27/2005 Document Revised: 07/08/2014 Document Reviewed: 01/21/2014 Elsevier Interactive Patient Education [employer].

## 2015-04-05 NOTE — ED Notes (Signed)
Pt reports back pain and central chest pain that began today while pt was at work - states he felt like he had a hot flash - pt attributes back pain to possible twisting or turning the wrong way. Pt denies n/v or shortness of breath. Pt in no acute distress, speaking complete sentences.

## 2015-04-05 NOTE — ED Notes (Signed)
Pt was at work and started having back pain and chest pain. Also reports some SOB. States it started a couple hours ago and took some aleve but it hasn't helped. States the pain is sharp in nature.

## 2015-04-06 NOTE — ED Notes (Signed)
Pt ambulating independently w/ steady gait on d/c in no acute distress, A&Ox4. D/c instructions reviewed w/ pt and family - pt and family deny any further questions or concerns at present. Rx given x1  

## 2015-04-17 ENCOUNTER — Encounter (HOSPITAL_COMMUNITY): Payer: Self-pay | Admitting: Family Medicine

## 2015-04-17 ENCOUNTER — Emergency Department (HOSPITAL_COMMUNITY): Payer: Self-pay

## 2015-04-17 ENCOUNTER — Observation Stay (HOSPITAL_COMMUNITY)
Admission: EM | Admit: 2015-04-17 | Discharge: 2015-04-19 | Disposition: A | Discharge status: Home / Self Care | Payer: Self-pay | Attending: Family Medicine | Admitting: Family Medicine

## 2015-04-17 DIAGNOSIS — Z23 Encounter for immunization: Secondary | ICD-10-CM | POA: Insufficient documentation

## 2015-04-17 DIAGNOSIS — I10 Essential (primary) hypertension: Secondary | ICD-10-CM | POA: Insufficient documentation

## 2015-04-17 DIAGNOSIS — R079 Chest pain, unspecified: Principal | ICD-10-CM | POA: Diagnosis present

## 2015-04-17 DIAGNOSIS — R001 Bradycardia, unspecified: Secondary | ICD-10-CM | POA: Insufficient documentation

## 2015-04-17 DIAGNOSIS — R072 Precordial pain: Secondary | ICD-10-CM

## 2015-04-17 DIAGNOSIS — Z79899 Other long term (current) drug therapy: Secondary | ICD-10-CM | POA: Insufficient documentation

## 2015-04-17 DIAGNOSIS — M6281 Muscle weakness (generalized): Secondary | ICD-10-CM

## 2015-04-17 DIAGNOSIS — K219 Gastro-esophageal reflux disease without esophagitis: Secondary | ICD-10-CM | POA: Insufficient documentation

## 2015-04-17 DIAGNOSIS — N289 Disorder of kidney and ureter, unspecified: Secondary | ICD-10-CM | POA: Insufficient documentation

## 2015-04-17 DIAGNOSIS — E876 Hypokalemia: Secondary | ICD-10-CM | POA: Insufficient documentation

## 2015-04-17 LAB — RAPID URINE DRUG SCREEN, HOSP PERFORMED
Amphetamines: NOT DETECTED
Barbiturates: NOT DETECTED
Benzodiazepines: NOT DETECTED
Cocaine: NOT DETECTED
Opiates: NOT DETECTED
Tetrahydrocannabinol: NOT DETECTED

## 2015-04-17 LAB — BASIC METABOLIC PANEL
Anion gap: 10 (ref 5–15)
BUN: 21 mg/dL — ABNORMAL HIGH (ref 6–20)
CO2: 30 mmol/L (ref 22–32)
Calcium: 9.2 mg/dL (ref 8.9–10.3)
Chloride: 100 mmol/L — ABNORMAL LOW (ref 101–111)
Creatinine, Ser: 1.63 mg/dL — ABNORMAL HIGH (ref 0.61–1.24)
GFR calc Af Amer: 52 mL/min — ABNORMAL LOW (ref >60)
GFR calc non Af Amer: 45 mL/min — ABNORMAL LOW (ref >60)
Glucose, Bld: 109 mg/dL — ABNORMAL HIGH (ref 65–99)
Potassium: 2.9 mmol/L — ABNORMAL LOW (ref 3.5–5.1)
Sodium: 140 mmol/L (ref 135–145)

## 2015-04-17 LAB — COMPREHENSIVE METABOLIC PANEL
ALT: 72 U/L — ABNORMAL HIGH (ref 17–63)
AST: 56 U/L — ABNORMAL HIGH (ref 15–41)
Albumin: 3.5 g/dL (ref 3.5–5.0)
Alkaline Phosphatase: 44 U/L (ref 38–126)
Anion gap: 6 (ref 5–15)
BUN: 17 mg/dL (ref 6–20)
CO2: 31 mmol/L (ref 22–32)
Calcium: 8.7 mg/dL — ABNORMAL LOW (ref 8.9–10.3)
Chloride: 99 mmol/L — ABNORMAL LOW (ref 101–111)
Creatinine, Ser: 1.49 mg/dL — ABNORMAL HIGH (ref 0.61–1.24)
GFR calc Af Amer: 58 mL/min — ABNORMAL LOW (ref >60)
GFR calc non Af Amer: 50 mL/min — ABNORMAL LOW (ref >60)
Glucose, Bld: 115 mg/dL — ABNORMAL HIGH (ref 65–99)
Potassium: 3 mmol/L — ABNORMAL LOW (ref 3.5–5.1)
Sodium: 136 mmol/L (ref 135–145)
Total Bilirubin: 0.7 mg/dL (ref 0.3–1.2)
Total Protein: 7 g/dL (ref 6.5–8.1)

## 2015-04-17 LAB — LIPID PANEL
Cholesterol: 167 mg/dL (ref 0–200)
HDL: 38 mg/dL — ABNORMAL LOW (ref >40)
LDL Cholesterol: 115 mg/dL — ABNORMAL HIGH (ref 0–99)
Total CHOL/HDL Ratio: 4.4 RATIO
Triglycerides: 70 mg/dL (ref <150)
VLDL: 14 mg/dL (ref 0–40)

## 2015-04-17 LAB — I-STAT TROPONIN, ED
Troponin i, poc: 0.02 ng/mL (ref 0.00–0.08)
Troponin i, poc: 0.02 ng/mL (ref 0.00–0.08)

## 2015-04-17 LAB — CBC
HCT: 41.9 % (ref 39.0–52.0)
Hemoglobin: 14.7 g/dL (ref 13.0–17.0)
MCH: 32.2 pg (ref 26.0–34.0)
MCHC: 35.1 g/dL (ref 30.0–36.0)
MCV: 91.9 fL (ref 78.0–100.0)
Platelets: 375 10*3/uL (ref 150–400)
RBC: 4.56 MIL/uL (ref 4.22–5.81)
RDW: 13.2 % (ref 11.5–15.5)
WBC: 5 10*3/uL (ref 4.0–10.5)

## 2015-04-17 LAB — TSH: TSH: 0.816 u[IU]/mL (ref 0.350–4.500)

## 2015-04-17 LAB — TROPONIN I: Troponin I: 0.03 ng/mL (ref <0.031)

## 2015-04-17 MED ORDER — SODIUM CHLORIDE 0.9 % IV SOLN
INTRAVENOUS | Status: DC
  Administered 2015-04-17 – 2015-04-18 (×2): via INTRAVENOUS

## 2015-04-17 MED ORDER — MORPHINE SULFATE (PF) 2 MG/ML IV SOLN
2.0000 mg | INTRAVENOUS | Status: DC | PRN
  Administered 2015-04-17 – 2015-04-19 (×7): 2 mg via INTRAVENOUS
  Filled 2015-04-17 (×8): qty 1

## 2015-04-17 MED ORDER — ONDANSETRON HCL 4 MG/2ML IJ SOLN: 4.0000 mg | Freq: Four times a day (QID) | INTRAMUSCULAR | Status: DC | PRN

## 2015-04-17 MED ORDER — ACETAMINOPHEN 325 MG PO TABS: 650.0000 mg | ORAL_TABLET | ORAL | Status: DC | PRN

## 2015-04-17 MED ORDER — POTASSIUM CHLORIDE CRYS ER 20 MEQ PO TBCR: 20.0000 meq | EXTENDED_RELEASE_TABLET | Freq: Every day | ORAL | Status: DC

## 2015-04-17 MED ORDER — SODIUM CHLORIDE 0.9 % IV SOLN
Freq: Once | INTRAVENOUS | Status: AC
  Administered 2015-04-17: 15:00:00 via INTRAVENOUS

## 2015-04-17 MED ORDER — METOPROLOL TARTRATE 25 MG PO TABS
50.0000 mg | ORAL_TABLET | Freq: Once | ORAL | Status: AC
  Administered 2015-04-17: 50 mg via ORAL
  Filled 2015-04-17: qty 2

## 2015-04-17 MED ORDER — LABETALOL HCL 5 MG/ML IV SOLN
20.0000 mg | Freq: Once | INTRAVENOUS | Status: AC
  Administered 2015-04-17: 20 mg via INTRAVENOUS
  Filled 2015-04-17: qty 4

## 2015-04-17 MED ORDER — SODIUM CHLORIDE 0.9 % IV BOLUS (SEPSIS)
500.0000 mL | Freq: Once | INTRAVENOUS | Status: AC
  Administered 2015-04-17: 500 mL via INTRAVENOUS

## 2015-04-17 MED ORDER — HYDRALAZINE HCL 20 MG/ML IJ SOLN
10.0000 mg | Freq: Four times a day (QID) | INTRAMUSCULAR | Status: DC | PRN
Start: 2015-04-17 — End: 2015-04-19
  Filled 2015-04-17: qty 1

## 2015-04-17 MED ORDER — POTASSIUM CHLORIDE 10 MEQ/100ML IV SOLN
10.0000 meq | Freq: Once | INTRAVENOUS | Status: AC
Start: 2015-04-17 — End: 2015-04-17
  Administered 2015-04-17: 10 meq via INTRAVENOUS
  Filled 2015-04-17: qty 100

## 2015-04-17 MED ORDER — IOHEXOL 350 MG/ML SOLN
80.0000 mL | Freq: Once | INTRAVENOUS | Status: AC | PRN
  Administered 2015-04-17: 100 mL via INTRAVENOUS

## 2015-04-17 MED ORDER — ENOXAPARIN SODIUM 40 MG/0.4ML ~~LOC~~ SOLN
40.0000 mg | SUBCUTANEOUS | Status: DC
  Administered 2015-04-17 – 2015-04-18 (×2): 40 mg via SUBCUTANEOUS
  Filled 2015-04-17 (×2): qty 0.4

## 2015-04-17 MED ORDER — GI COCKTAIL ~~LOC~~
30.0000 mL | Freq: Four times a day (QID) | ORAL | Status: DC | PRN
Start: 2015-04-17 — End: 2015-04-19

## 2015-04-17 MED ORDER — AMLODIPINE BESYLATE 10 MG PO TABS
10.0000 mg | ORAL_TABLET | Freq: Every day | ORAL | Status: DC
  Administered 2015-04-17 – 2015-04-19 (×3): 10 mg via ORAL
  Filled 2015-04-17 (×3): qty 1

## 2015-04-17 MED ORDER — POTASSIUM CHLORIDE CRYS ER 20 MEQ PO TBCR
40.0000 meq | EXTENDED_RELEASE_TABLET | Freq: Once | ORAL | Status: AC
Start: 2015-04-17 — End: 2015-04-17
  Administered 2015-04-17: 40 meq via ORAL
  Filled 2015-04-17: qty 2

## 2015-04-17 MED ORDER — HYDROCHLOROTHIAZIDE 25 MG PO TABS
25.0000 mg | ORAL_TABLET | Freq: Every day | ORAL | Status: DC
  Administered 2015-04-17 – 2015-04-18 (×2): 25 mg via ORAL
  Filled 2015-04-17 (×2): qty 1

## 2015-04-17 MED ORDER — ASPIRIN 81 MG PO CHEW
81.0000 mg | CHEWABLE_TABLET | Freq: Every day | ORAL | Status: DC
  Administered 2015-04-18 – 2015-04-19 (×2): 81 mg via ORAL
  Filled 2015-04-17 (×2): qty 1

## 2015-04-17 MED ORDER — MORPHINE SULFATE (PF) 4 MG/ML IV SOLN
6.0000 mg | Freq: Once | INTRAVENOUS | Status: AC
  Administered 2015-04-17: 6 mg via INTRAVENOUS
  Filled 2015-04-17: qty 2
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
## 2015-04-17 NOTE — ED Notes (Signed)
Pt here for right sided chest pain radiating into right arm and shoulder. sts hurts when he takes a deep breath and moves. Pt hypertensive, 211/132. sts he took his BP meds this am.

## 2015-04-17 NOTE — Progress Notes (Addendum)
Pt HR dropping into 30s at times, [facility] MD notified, no new orders received  At this time, will continue to monitor closely.  Edward Qualia RN

## 2015-04-17 NOTE — ED Notes (Signed)
Has been out of amlodipine for 1 week-- taking metoprolol and HCTZ as directed. (took meds this am)-- has taken OTC meds and used "patch for pain" on chest and back area without relief. C/o pain with movement/breathing. Radiates to back-- no known injury.

## 2015-04-18 ENCOUNTER — Observation Stay (HOSPITAL_COMMUNITY): Payer: Self-pay

## 2015-04-18 ENCOUNTER — Ambulatory Visit: Payer: Self-pay | Admitting: Family Medicine

## 2015-04-18 DIAGNOSIS — R071 Chest pain on breathing: Secondary | ICD-10-CM

## 2015-04-18 DIAGNOSIS — R079 Chest pain, unspecified: Secondary | ICD-10-CM | POA: Insufficient documentation

## 2015-04-18 DIAGNOSIS — I10 Essential (primary) hypertension: Secondary | ICD-10-CM

## 2015-04-18 DIAGNOSIS — E876 Hypokalemia: Secondary | ICD-10-CM

## 2015-04-18 DIAGNOSIS — M6281 Muscle weakness (generalized): Secondary | ICD-10-CM

## 2015-04-18 DIAGNOSIS — N289 Disorder of kidney and ureter, unspecified: Secondary | ICD-10-CM

## 2015-04-18 LAB — BASIC METABOLIC PANEL
Anion gap: 9 (ref 5–15)
BUN: 16 mg/dL (ref 6–20)
CO2: 30 mmol/L (ref 22–32)
Calcium: 8.9 mg/dL (ref 8.9–10.3)
Chloride: 98 mmol/L — ABNORMAL LOW (ref 101–111)
Creatinine, Ser: 1.55 mg/dL — ABNORMAL HIGH (ref 0.61–1.24)
GFR calc Af Amer: 55 mL/min — ABNORMAL LOW (ref >60)
GFR calc non Af Amer: 48 mL/min — ABNORMAL LOW (ref >60)
Glucose, Bld: 100 mg/dL — ABNORMAL HIGH (ref 65–99)
Potassium: 2.7 mmol/L — CL (ref 3.5–5.1)
Sodium: 137 mmol/L (ref 135–145)

## 2015-04-18 LAB — CBC
HCT: 42.2 % (ref 39.0–52.0)
Hemoglobin: 14.6 g/dL (ref 13.0–17.0)
MCH: 31.9 pg (ref 26.0–34.0)
MCHC: 34.6 g/dL (ref 30.0–36.0)
MCV: 92.1 fL (ref 78.0–100.0)
Platelets: 366 10*3/uL (ref 150–400)
RBC: 4.58 MIL/uL (ref 4.22–5.81)
RDW: 13.4 % (ref 11.5–15.5)
WBC: 5.7 10*3/uL (ref 4.0–10.5)

## 2015-04-18 LAB — TROPONIN I
Troponin I: <0.03 ng/mL (ref <0.031)
Troponin I: <0.03 ng/mL (ref <0.031)

## 2015-04-18 LAB — MAGNESIUM: Magnesium: 1.8 mg/dL (ref 1.7–2.4)

## 2015-04-18 LAB — HEMOGLOBIN A1C
Hgb A1c MFr Bld: 5.7 % — ABNORMAL HIGH (ref 4.8–5.6)
Mean Plasma Glucose: 117 mg/dL

## 2015-04-18 LAB — POTASSIUM: Potassium: 3 mmol/L — ABNORMAL LOW (ref 3.5–5.1)

## 2015-04-18 MED ORDER — HYDROCHLOROTHIAZIDE 25 MG PO TABS
12.5000 mg | ORAL_TABLET | Freq: Every day | ORAL | Status: DC
  Administered 2015-04-19: 12.5 mg via ORAL
  Filled 2015-04-18: qty 1

## 2015-04-18 MED ORDER — PNEUMOCOCCAL VAC POLYVALENT 25 MCG/0.5ML IJ INJ
0.5000 mL | INJECTION | INTRAMUSCULAR | Status: AC
  Administered 2015-04-19: 0.5 mL via INTRAMUSCULAR
  Filled 2015-04-18: qty 0.5

## 2015-04-18 MED ORDER — VITAMIN B-1 100 MG PO TABS
100.0000 mg | ORAL_TABLET | Freq: Every day | ORAL | Status: DC
  Administered 2015-04-18 – 2015-04-19 (×2): 100 mg via ORAL
  Filled 2015-04-18 (×2): qty 1

## 2015-04-18 MED ORDER — LORAZEPAM 2 MG/ML IJ SOLN
1.0000 mg | Freq: Four times a day (QID) | INTRAMUSCULAR | Status: DC | PRN
  Filled 2015-04-18: qty 1

## 2015-04-18 MED ORDER — INFLUENZA VAC SPLIT QUAD 0.5 ML IM SUSY
0.5000 mL | PREFILLED_SYRINGE | INTRAMUSCULAR | Status: AC
  Administered 2015-04-19: 0.5 mL via INTRAMUSCULAR
  Filled 2015-04-18: qty 0.5

## 2015-04-18 MED ORDER — POTASSIUM CHLORIDE 10 MEQ/100ML IV SOLN
10.0000 meq | Freq: Once | INTRAVENOUS | Status: AC
  Administered 2015-04-18: 10 meq via INTRAVENOUS
  Filled 2015-04-18: qty 100

## 2015-04-18 MED ORDER — LORAZEPAM 1 MG PO TABS: 1.0000 mg | ORAL_TABLET | Freq: Four times a day (QID) | ORAL | Status: DC | PRN

## 2015-04-18 MED ORDER — THIAMINE HCL 100 MG/ML IJ SOLN: 100.0000 mg | Freq: Every day | INTRAMUSCULAR | Status: DC

## 2015-04-18 MED ORDER — LISINOPRIL 10 MG PO TABS
10.0000 mg | ORAL_TABLET | Freq: Every day | ORAL | Status: DC
  Administered 2015-04-18: 10 mg via ORAL
  Filled 2015-04-18 (×2): qty 1

## 2015-04-18 MED ORDER — ADULT MULTIVITAMIN W/MINERALS CH
1.0000 | ORAL_TABLET | Freq: Every day | ORAL | Status: DC
  Administered 2015-04-18 – 2015-04-19 (×2): 1 via ORAL
  Filled 2015-04-18 (×2): qty 1

## 2015-04-18 MED ORDER — POTASSIUM CHLORIDE 10 MEQ/100ML IV SOLN
10.0000 meq | INTRAVENOUS | Status: AC
  Administered 2015-04-18 (×4): 10 meq via INTRAVENOUS
  Filled 2015-04-18 (×4): qty 100

## 2015-04-18 MED ORDER — POTASSIUM CHLORIDE CRYS ER 20 MEQ PO TBCR: 40.0000 meq | EXTENDED_RELEASE_TABLET | Freq: Three times a day (TID) | ORAL | Status: DC

## 2015-04-18 MED ORDER — FOLIC ACID 1 MG PO TABS
1.0000 mg | ORAL_TABLET | Freq: Every day | ORAL | Status: DC
  Administered 2015-04-18 – 2015-04-19 (×2): 1 mg via ORAL
  Filled 2015-04-18 (×2): qty 1

## 2015-04-18 NOTE — Progress Notes (Signed)
FMTS Attending Daily Note:  Annabell Sabal MD  S:  Patient feels better today.  With some arm pain and muscle atrophy of Right tricep.  No further chest pain.  Does continue to have back pain.    Exam:   Gen:  Awake and alert.  Heart:  RRR Lungs: Clear MSK:  Right shoulder with some tenderness directly over [facility] joint, but otherwise clear.  Good internal/external rotation.  Tricep strength is 1/5 on Right side.  Atrophy noted.   Back:  TTP mid-thoracic region on Right side.  Spasm palpated here.   Imp/Plan: 1. Chest pain:  - atypical.  Resolved.  Likely secondary to MSK pain from muscle spasm in back.   - troponins negative.  EKG bradycardia and LVH.  He is long-term runner. - Plan to DC home today after imaging, see below  2.  Right tricep pain:  - seems to have torn tricep.  Occurred 3 months ago during weight lifting.  Has lost much use of that arm since then. - MRI of humerus today to assess tear.  Unable to tell if at origin or insertion.   3.  HTN:  - decrease HCTZ in light of hypokalemia - better today.   4.  Hypokalemia:  - as above.    Alveda Reasons, MD 04/18/2015 1:49 PM
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
## 2015-04-18 NOTE — Progress Notes (Signed)
Family Medicine Teaching Service Daily Progress Note Intern Pager: [phone]  Patient name: Corey Guerrero Medical record number: [id number] Date of birth: 1955/08/03 Age: 59 y.o. Gender: male  Primary Care Provider: Minerva Ends, MD Consultants: none Code Status: full  Pt Overview and Major Events to Date:  10/17 admitted with chest pain and hypertensive urgency  Assessment and Plan: Corey Guerrero is a 59 y.o. male presenting with chest pain and hypertensive urgency . PMH is significant for hypertension, CKD II - III, GERD and substance use (sober now)  Chest pain: improved. Likely MSK. Pain with moving his right arm. Also right arm weakness and atrophy of right triceps since he dropped a dumbbell on himself about three months ago. Had X-ray of his right elbow in 7/16 and 8/16 that was negative for fractures but bursitis and soft tissue swelling.  HEART score of 4. However, pain is nonanginal, positional and reproducible, and there is no EKG changes and his troponin are negative. Aortic dissection less likely with negative CTA (Study Mostly Complete), normal pulse pressure, HTN consistent in both arms. Less suspicious for PE with no tachycardia, tachypnea and risk factors. Normal CXR and no sign of infection. No family Hx of early cardiac death. CBC within normal limit as well. Has significant hx of GERD. But this pain is different. UDS negative as well. -Admit FPTS. Attending Dr. Mingo Amber - MRI of right humerus including elbow - Continue baby ASA here - Tylenol prn for pain. Hold off NSAID given his CKD.  - morphine per CP order set.  - risk stratification labs (lipid panel & A1c), TSH  - Will attempt to improve BP control as below.  - Nitro prn.   Hypertensive urgency: resolved. BP 211/132 mHg on presentation, 144/89 this morning. S/p metoprolol 50 mg PO and Labetalol 20 mg IV in ED. No neurologic deficit on exam except for some weakness in his right triceps. No sign of new  end-organ damage. On amlodipine 10 mg, metoprolol tartrate 25 mg bid and HCTZ 25 mg daily at home. Has been out of amlodipine 10 mg for one week and HCTZ for two days that could have contributed to this. Concern for OSA as he reports snoring at night. Chart review with borderline high BP's in the clinic. This was presumably on his current home regimen.  - Start lisinopril 10 mg daily - Reduce his HCTZ to 12.5 mg daily due to chronic hypokalemia - Discontinue his metoprolol given his bradycardia although this could be exercise related - Alternate prn hydralazine and labetalol for SBP over 180 and DBP over 100 mmHg. - Can max out his BP meds if BP continues to be high  Hypokalemia: Pt. Noted to have K - 2.9 on admission, 2.7 this morning. Chronically low. Likely from his HCTZ - Decreased his HCTZ - Added lisinopril 10 mg - Repleting K - BMET  CKD: sCr 1.76 (appears to at baseline), CKD II-III. Received low dose IV contrast with CTA.  - Bmet in the morning.  GERD: Doesn't seem he is on any medication for this. Says he gets reflux any time he lays flat.  Gave GI cocktail once - Protonix 40 mg once daily  FEN/GI: -heart healthy diet  Prophylaxis: Lovenox  Disposition: floor pending right humerus MRI  Subjective:  Continues to report chest pain. This morning his pain is more over his shoulder blade. He also reports pain and weakness in his right arm. Pain 6/10. Denies numbness and tingling. Denies SOB, diaphoresis,  nausea and vomiting  Objective: Temp:  [97.5 F (36.4 C)-98 F (36.7 C)] 98 F (36.7 C) (10/18 0548) Pulse Rate:  [40-48] 47 (10/18 0548) Resp:  [13-18] 18 (10/18 0548) BP: (135-188)/(89-119) 144/89 mmHg (10/18 0548) SpO2:  [96 %-100 %] 98 % (10/18 0548) Weight:  [172 lb 6.4 oz (78.2 kg)-172 lb 9.6 oz (78.291 kg)] 172 lb 6.4 oz (78.2 kg) (10/18 0548)   Physical Exam: Gen: NAD, well-appearing Eyes: PERRLA, sclera anicteric, no conjunctival injection Ear: normal TM  and ear canal  Nares: clear, no erythema, swelling or congestion Oropharynx: clear, moist Chest: tender to palpation over right chest, pain with moving his right arm over his head CV: RRR. S1 & S2 audible, no murmurs. 2+ radial and DP pulses bilaterally. Resp: no apparent WOB, CTAB. Abd: +BS. Soft, NDNT, no rebound or guarding.  Ext: atrophy of right triceps muscle. No edema or gross deformities. Neuro: Alert, flexion, extension, internal and external rotation limited in right shoulder due to pain, tolerated full abduction some swelling over his right elbow. Sensation intact.  Motor 5/5 in all muscle groups except right tricep(4/5). Hand grips 5/5.  Laboratory:  Recent Labs Lab 04/17/15 0948 04/18/15 0535  WBC 5.0 5.7  HGB 14.7 14.6  HCT 41.9 42.2  PLT 375 366    Recent Labs Lab 04/17/15 0948 04/17/15 1830 04/18/15 0535  NA 140 136 137  K 2.9* 3.0* 2.7*  CL 100* 99* 98*  CO2 30 31 30   BUN 21* 17 16  CREATININE 1.63* 1.49* 1.55*  CALCIUM 9.2 8.7* 8.9  PROT  --  7.0  --   BILITOT  --  0.7  --   ALKPHOS  --  44  --   ALT  --  72*  --   AST  --  56*  --   GLUCOSE 109* 115* 100*    Imaging/Diagnostic Tests: No results found.  Mercy Riding, MD 04/18/2015, 1:53 PM PGY-1, [place] Intern pager: [phone], text pages welcome

## 2015-04-19 ENCOUNTER — Ambulatory Visit (HOSPITAL_BASED_OUTPATIENT_CLINIC_OR_DEPARTMENT_OTHER): Payer: Self-pay

## 2015-04-19 DIAGNOSIS — R079 Chest pain, unspecified: Principal | ICD-10-CM

## 2015-04-19 LAB — BASIC METABOLIC PANEL
Anion gap: 8 (ref 5–15)
BUN: 18 mg/dL (ref 6–20)
CO2: 32 mmol/L (ref 22–32)
Calcium: 9.2 mg/dL (ref 8.9–10.3)
Chloride: 94 mmol/L — ABNORMAL LOW (ref 101–111)
Creatinine, Ser: 1.61 mg/dL — ABNORMAL HIGH (ref 0.61–1.24)
GFR calc Af Amer: 53 mL/min — ABNORMAL LOW (ref >60)
GFR calc non Af Amer: 46 mL/min — ABNORMAL LOW (ref >60)
Glucose, Bld: 108 mg/dL — ABNORMAL HIGH (ref 65–99)
Potassium: 3.1 mmol/L — ABNORMAL LOW (ref 3.5–5.1)
Sodium: 134 mmol/L — ABNORMAL LOW (ref 135–145)

## 2015-04-19 LAB — HEPATITIS C ANTIBODY: HCV Ab: >11 s/co ratio — ABNORMAL HIGH (ref 0.0–0.9)

## 2015-04-19 MED ORDER — POTASSIUM CHLORIDE CRYS ER 20 MEQ PO TBCR: 20.0000 meq | EXTENDED_RELEASE_TABLET | Freq: Two times a day (BID) | ORAL | Status: DC

## 2015-04-19 MED ORDER — LISINOPRIL 20 MG PO TABS: 20.0000 mg | ORAL_TABLET | Freq: Every day | ORAL | Status: DC

## 2015-04-19 MED ORDER — POTASSIUM CHLORIDE 10 MEQ/100ML IV SOLN
10.0000 meq | INTRAVENOUS | Status: DC
  Administered 2015-04-19: 10 meq via INTRAVENOUS
  Filled 2015-04-19: qty 100

## 2015-04-19 MED ORDER — TRAMADOL HCL 50 MG PO TABS: 50.0000 mg | ORAL_TABLET | Freq: Four times a day (QID) | ORAL | Status: DC | PRN

## 2015-04-19 MED ORDER — LISINOPRIL 40 MG PO TABS
40.0000 mg | ORAL_TABLET | Freq: Every day | ORAL | Status: DC
  Administered 2015-04-19: 40 mg via ORAL
  Filled 2015-04-19: qty 1

## 2015-04-19 MED ORDER — POTASSIUM CHLORIDE CRYS ER 20 MEQ PO TBCR
40.0000 meq | EXTENDED_RELEASE_TABLET | Freq: Three times a day (TID) | ORAL | Status: DC
  Administered 2015-04-19 (×2): 40 meq via ORAL
  Filled 2015-04-19 (×2): qty 2

## 2015-04-19 MED ORDER — HYDROCHLOROTHIAZIDE 12.5 MG PO TABS
12.5000 mg | ORAL_TABLET | Freq: Every day | ORAL | Status: DC
Start: 2015-04-19 — End: 2015-04-28

## 2015-04-19 NOTE — Progress Notes (Signed)
Patient Name: Corey Guerrero Date of Encounter: 04/19/2015   SUBJECTIVE  CP and back pain improved. However still having some discomfort.   CURRENT MEDS . amLODipine  10 mg Oral Daily  . aspirin  81 mg Oral Daily  . enoxaparin (LOVENOX) injection  40 mg Subcutaneous Q24H  . folic acid  1 mg Oral Daily  . hydrochlorothiazide  12.5 mg Oral Daily  . Influenza vac split quadrivalent PF  0.5 mL Intramuscular Tomorrow-1000  . lisinopril  40 mg Oral Daily  . multivitamin with minerals  1 tablet Oral Daily  . pneumococcal 23 valent vaccine  0.5 mL Intramuscular Tomorrow-1000  . potassium chloride  40 mEq Oral TID  . thiamine  100 mg Oral Daily    OBJECTIVE  Filed Vitals:   04/19/15 0500 04/19/15 0745 04/19/15 0747 04/19/15 0922  BP: 160/98 181/104 175/104 147/91  Pulse: 54     Temp: 97.7 F (36.5 C) 97.4 F (36.3 C)    TempSrc: Oral Oral    Resp: 18 15    Height:      Weight: 171 lb 15.3 oz (78 kg)     SpO2: 98% 98%      Intake/Output Summary (Last 24 hours) at 04/19/15 1004 Last data filed at 04/18/15 1804  Gross per 24 hour  Intake 1709.67 ml  Output   1045 ml  Net 664.67 ml   Filed Weights   04/17/15 1740 04/18/15 0548 04/19/15 0500  Weight: 172 lb 9.6 oz (78.291 kg) 172 lb 6.4 oz (78.2 kg) 171 lb 15.3 oz (78 kg)    PHYSICAL EXAM  General: Pleasant, NAD. Neuro: Alert and oriented X 3. Moves all extremities spontaneously. Psych: Normal affect. HEENT:  Normal  Neck: Supple without bruits or JVD.  Lungs:  Resp regular and unlabored, CTA.TTP over R upper chest.  Heart: RRR no s3, s4, or murmurs. Abdomen: Soft, non-tender, non-distended, BS + x 4.  Extremities: No clubbing, cyanosis or edema. DP/PT/Radials 2+ and equal bilaterally.  Accessory Clinical Findings  CBC  Recent Labs  04/17/15 0948 04/18/15 0535  WBC 5.0 5.7  HGB 14.7 14.6  HCT 41.9 42.2  MCV 91.9 92.1  PLT 375 [id number]   Basic Metabolic Panel  Recent Labs  04/17/15 1830 04/18/15 0535  04/18/15 0912 04/18/15 1655  NA 136 137  --   --   K 3.0* 2.7*  --  3.0*  CL 99* 98*  --   --   CO2 31 30  --   --   GLUCOSE 115* 100*  --   --   BUN 17 16  --   --   CREATININE 1.49* 1.55*  --   --   CALCIUM 8.7* 8.9  --   --   MG  --   --  1.8  --    BMP Latest Ref Rng 04/19/2015 04/18/2015 04/18/2015  Glucose 65 - 99 mg/dL 108(H) - 100(H)  BUN 6 - 20 mg/dL 18 - 16  Creatinine 0.61 - 1.24 mg/dL 1.61(H) - 1.55(H)  Sodium 135 - 145 mmol/L 134(L) - 137  Potassium 3.5 - 5.1 mmol/L 3.1(L) 3.0(L) 2.7(LL)  Chloride 101 - 111 mmol/L 94(L) - 98(L)  CO2 22 - 32 mmol/L 32 - 30  Calcium 8.9 - 10.3 mg/dL 9.2 - 8.9    Liver Function Tests  Recent Labs  04/17/15 1830  AST 56*  ALT 72*  ALKPHOS 44  BILITOT 0.7  PROT 7.0  ALBUMIN 3.5   No  results for input(s): LIPASE, AMYLASE in the last 72 hours. Cardiac Enzymes  Recent Labs  04/17/15 1830 04/17/15 2348 04/18/15 0535  TROPONINI 0.03 <0.03 <0.03  Hemoglobin A1C  Recent Labs  04/17/15 1830  HGBA1C 5.7*   Fasting Lipid Panel  Recent Labs  04/17/15 1830  CHOL 167  HDL 38*  LDLCALC 115*  TRIG 70  CHOLHDL 4.4   Thyroid Function Tests  Recent Labs  04/17/15 1830  TSH 0.816    TELE  Sinus rhythm with transient ventri bigeminy.   Radiology/Studies  Dg Chest 2 View  04/17/2015  CLINICAL DATA:  Chest pain EXAM: CHEST  2 VIEW COMPARISON:  04/05/2015 chest radiograph FINDINGS: Stable cardiomediastinal silhouette with normal heart size. No pneumothorax. No pleural effusion. Clear lungs, with no focal lung consolidation and no pulmonary edema. IMPRESSION: No active disease in the chest. Electronically Signed   By: Ilona Sorrel M.D.   On: 04/17/2015 10:43   Dg Chest 2 View  04/05/2015  CLINICAL DATA:  Left-sided chest and back pain today. EXAM: CHEST  2 VIEW COMPARISON:  10/24/2014; 07/24/2013; 01/28/2013 FINDINGS: Grossly unchanged cardiac silhouette and mediastinal contours. No focal parenchymal opacities. No  pleural effusion or pneumothorax. No evidence of edema. Stable deformity involving the distal end of the right clavicle. No definite acute osseous abnormalities. IMPRESSION: No acute cardiopulmonary disease. Electronically Signed   By: Sandi Mariscal M.D.   On: 04/05/2015 19:44   Ct Angio Chest Aorta W/cm &/or Wo/cm  04/17/2015  ADDENDUM REPORT: 04/17/2015 14:00 ADDENDUM: The patient's thorax was rescanned using 50 mL Omnipaque 350 contrast. The upper aspect of the aortic arch and the origins of the great vessels are also normal. Thoracic inlet is normal. Electronically Signed   By: Skipper Cliche M.D.   On: 04/17/2015 14:00  04/17/2015  CLINICAL DATA:  Chest pain radiating to back with deep inspiration which began a few days ago EXAM: CT ANGIOGRAPHY CHEST, ABDOMEN AND PELVIS TECHNIQUE: Multidetector CT imaging through the chest, abdomen and pelvis was performed using the standard protocol during bolus administration of intravenous contrast. Multiplanar reconstructed images and MIPs were obtained and reviewed to evaluate the vascular anatomy. CONTRAST:  30mL OMNIPAQUE IOHEXOL 350 MG/ML SOLN COMPARISON:  01/25/2010 FINDINGS: CTA CHEST FINDINGS The cranial half of the aortic arch is not included on the postcontrast images. Ascending aorta shows no dissection or dilatation. Descending thoracic aorta shows no dissection or dilatation. Mild cardiomegaly stable from prior study. No evidence of pericardial effusion. No significant hilar or mediastinal adenopathy. Central pulmonary arteries normal. No pleural effusion.  Lungs are clear. No acute musculoskeletal findings in the thorax. Review of the MIP images confirms the above findings. CTA ABDOMEN AND PELVIS FINDINGS Abdominal aorta demonstrates no evidence of dissection or dilatation. Celiac artery and superior mesenteric artery are patent. Bilateral renal arteries are patent. Inferior mesenteric artery is patent. Mild noncalcified plaque in the infrarenal abdominal  aortic aneurysm just above the origin of the inferior mesenteric artery. This is mildly progressive when compared to the prior study. No evidence of iliac artery dissection or dilatation on either side. Liver and gallbladder are normal. Spleen is normal. Pancreas is normal. Adrenal glands are normal. Kidneys are normal. Bowel is normal. Bladder and reproductive organs are normal. There is no free fluid in the abdomen or pelvis. Tiny fat containing periumbilical hernia. There are no acute musculoskeletal findings in the abdomen or pelvis. Review of the MIP images confirms the above findings. IMPRESSION: No acute abnormalities with no evidence  of aortic dissection or aneurysm. However, portions of the upper aorta are excluded from the images due to malfunction of the CT scanner. The technologist is aware and the patient will be rescanned with a reduced dose to complete the thoracic portion of the study. Mild noncalcified atherosclerotic plaque distal abdominal aorta. Electronically Signed: By: Skipper Cliche M.D. On: 04/17/2015 13:35   Ct Angio Abd/pel W/ And/or W/o  04/17/2015  ADDENDUM REPORT: 04/17/2015 14:00 ADDENDUM: The patient's thorax was rescanned using 50 mL Omnipaque 350 contrast. The upper aspect of the aortic arch and the origins of the great vessels are also normal. Thoracic inlet is normal. Electronically Signed   By: Skipper Cliche M.D.   On: 04/17/2015 14:00  04/17/2015  CLINICAL DATA:  Chest pain radiating to back with deep inspiration which began a few days ago EXAM: CT ANGIOGRAPHY CHEST, ABDOMEN AND PELVIS TECHNIQUE: Multidetector CT imaging through the chest, abdomen and pelvis was performed using the standard protocol during bolus administration of intravenous contrast. Multiplanar reconstructed images and MIPs were obtained and reviewed to evaluate the vascular anatomy. CONTRAST:  64mL OMNIPAQUE IOHEXOL 350 MG/ML SOLN COMPARISON:  01/25/2010 FINDINGS: CTA CHEST FINDINGS The cranial half  of the aortic arch is not included on the postcontrast images. Ascending aorta shows no dissection or dilatation. Descending thoracic aorta shows no dissection or dilatation. Mild cardiomegaly stable from prior study. No evidence of pericardial effusion. No significant hilar or mediastinal adenopathy. Central pulmonary arteries normal. No pleural effusion.  Lungs are clear. No acute musculoskeletal findings in the thorax. Review of the MIP images confirms the above findings. CTA ABDOMEN AND PELVIS FINDINGS Abdominal aorta demonstrates no evidence of dissection or dilatation. Celiac artery and superior mesenteric artery are patent. Bilateral renal arteries are patent. Inferior mesenteric artery is patent. Mild noncalcified plaque in the infrarenal abdominal aortic aneurysm just above the origin of the inferior mesenteric artery. This is mildly progressive when compared to the prior study. No evidence of iliac artery dissection or dilatation on either side. Liver and gallbladder are normal. Spleen is normal. Pancreas is normal. Adrenal glands are normal. Kidneys are normal. Bowel is normal. Bladder and reproductive organs are normal. There is no free fluid in the abdomen or pelvis. Tiny fat containing periumbilical hernia. There are no acute musculoskeletal findings in the abdomen or pelvis. Review of the MIP images confirms the above findings. IMPRESSION: No acute abnormalities with no evidence of aortic dissection or aneurysm. However, portions of the upper aorta are excluded from the images due to malfunction of the CT scanner. The technologist is aware and the patient will be rescanned with a reduced dose to complete the thoracic portion of the study. Mild noncalcified atherosclerotic plaque distal abdominal aorta. Electronically Signed: By: Skipper Cliche M.D. On: 04/17/2015 13:35    ASSESSMENT AND PLAN  1. Atypical chest pain, likely MSK - Sharp R sided CP, reproducible with palpation. - Top x 3  negative. EKG non ischemic - Differential include chondritis given recent URI symptoms also high BP.  - CT chest, abdomen and pelvis negative for dissection or aneurysm. CXR normal. UDS negative. - Pending echo.   2. Accelerated hypertension - AT presentation his BP was 211/132 on s/p metoprolol 50 mg PO and Labetalol 20 mg IV.  - Continue current schedule amlodipine and HCTZ - BP still minimally elevated at 147/91. Home dose of lopressor 25mg  BID held due to transient bradycardia.  - Last echo 05/22/11 LV EF of 55-60%, grade 2 DD, moderate  LVH. Normal RV size and systolic function. No significant valvular dysfunction. Will get Echo today.  - Seems patient has uncontrolled HTN over times. He does snores at night. Consider sleep study as outpatient.  - consider adding hydralazine and possible evaluation for hyperaldosteronism.  3. Sinus bradycardia - sinus brady on tele with transient ventricular bigeminy. - continue to hold BB.   4. Hypokalemia - K still low to 3. Consider evaluation for hyperaldosteronism.  5. CKD stage II-III - Scr of 1.49 on presentation (~baseline). Scr worsen minimally to 1.55. Likely from contrast with CTA. - Continue IV hydration.  - Will get BMET today.    Jarrett Soho PA-C Pager [phone]  Patient seen and examined. Agree with assessment and plan.BP remains elevated despite amlodipine 10 mg, lisinopril 40 mg, and HCTZ 12.5 mg.   Creatinine is 1.6, 1 today and potassium remains low at 3.1.  He has been on supplemental potassium.  Since his creatinine is 1.6, I will not add spironolactone presently, but this may be beneficial to induce aldosterone blockade.  It is possible that he may have hyperaldosteronism contributing to his hypertension and low potassium levels despite ACE inhibition and supplemental KCl. Consider evaluation of adrenal glands.  2-D echo Doppler study was performed but results are still not yet available.    Troy Sine, MD, [facility] 04/19/2015 11:57 AM

## 2015-04-19 NOTE — Progress Notes (Signed)
Family Medicine Teaching Service Daily Progress Note Intern Pager: [phone]  Patient name: Corey Guerrero Medical record number: [id number] Date of birth: [birth year] Age: 59 y.o. Gender: male  Primary Care Provider: Minerva Ends, MD Consultants: none Code Status: full  Pt Overview and Major Events to Date:  10/17 admitted with chest pain and hypertensive urgency  Assessment and Plan: Corey Guerrero is a 59 y.o. male presenting with chest pain and hypertensive urgency . PMH is significant for hypertension, CKD II - III, GERD and substance use (sober now)  Chest pain: improved. Likely MSK. Pain with moving his right arm. Also right arm weakness and atrophy of right triceps since he dropped a dumbbell on himself about three months ago. Had X-ray of his right elbow in 7/16 and 8/16 that was negative for fractures but bursitis and soft tissue swelling.  HEART score of 4. However, pain is nonanginal, positional and reproducible, and there is no EKG changes and his troponin are negative. Aortic dissection less likely with negative CTA (Study Mostly Complete), normal pulse pressure, HTN consistent in both arms. Less suspicious for PE with no tachycardia, tachypnea and risk factors. Normal CXR and no sign of infection. No family Hx of early cardiac death. Last echo 06/02/11 LV EF of 55-60%, grade 2 DD, moderate LVH. Normal RV size and systolic function. No significant valvular dysfunction.CBC within normal limit as well. Has significant hx of GERD. But this pain is different. UDS negative as well. Patient claustrophobic and didn't get MRI of his arm. He is also refusing as his pain has improved. -Admit FPTS. Attending Dr. Mingo Amber - F/u Echo - Continue baby ASA here - Tylenol prn for pain. Hold off NSAID given his CKD.  - morphine per CP order set.  - risk stratification labs (lipid panel & A1c), TSH  - Will attempt to improve BP control as below.  - Nitro prn.   Accelerated Hypertension: BP  211/132 mHg on presentation, 175/104 this morning. S/p metoprolol 50 mg PO and Labetalol 20 mg IV in ED. No neurologic deficit on exam except for some weakness in his right triceps. No sign of new end-organ damage. On amlodipine 10 mg, metoprolol tartrate 25 mg bid and HCTZ 25 mg daily at home. Has been out of amlodipine 10 mg for one week and HCTZ for two days that could have contributed to this. Concern for OSA as he reports snoring at night. Chart review with borderline high BP's in the clinic. This was presumably on his current home regimen.  - Increased lisinopril to 20 mg daily - Reduce his HCTZ to 12.5 mg daily due to chronic hypokalemia - Discontinue his metoprolol given his bradycardia although this could be exercise related - Alternate prn hydralazine and labetalol for SBP over 180 and DBP over 100 mmHg. - Can max out his BP meds if BP continues to be high  Hypokalemia:Chronically low. 2.9>40 PO+ 10 IV>2.7>50 IV>3>. Mg 1.8.  Likely from his HCTZ - Decreased his HCTZ - Increased lisinopril to 20 mg - Repleting K - BMET  Positive Hep C antibody:  -Viral load with reflex for genotype -HBV screen  CKD: stable. sCr 1.55 (b/l 1.6-1.7), CKD II-III. Received low dose IV contrast with CTA.  - Bmet daily  GERD: Doesn't seem he is on any medication for this. Says he gets reflux any time he lays flat.  Gave GI cocktail once - Protonix 40 mg once daily  FEN/GI: -heart healthy diet  Prophylaxis: Lovenox  Disposition:  discharging home today  Subjective:  Reports pain is better today. Didn't get MRI due to claustrophobia.   Objective: Temp:  [97.7 F (36.5 C)-98.6 F (37 C)] 97.7 F (36.5 C) (10/19 0500) Pulse Rate:  [52-59] 54 (10/19 0500) Resp:  [18] 18 (10/19 0500) BP: (155-172)/(92-108) 160/98 mmHg (10/19 0500) SpO2:  [98 %-100 %] 98 % (10/19 0500) Weight:  [171 lb 15.3 oz (78 kg)] 171 lb 15.3 oz (78 kg) (10/19 0500)   Physical Exam: Gen: NAD, well-appearing Eyes:  PERRLA, sclera anicteric, no conjunctival injection Ear: normal TM and ear canal  Nares: clear, no erythema, swelling or congestion Oropharynx: clear, moist Chest: tender to palpation over right chest, pain with moving his right arm over his head CV: RRR. S1 & S2 audible, no murmurs. 2+ radial and DP pulses bilaterally. Resp: no apparent WOB, CTAB. Abd: +BS. Soft, NDNT, no rebound or guarding.  Ext: atrophy of right triceps muscle. No edema or gross deformities. Neuro: Alert, flexion, extension, internal and external rotation limited in right shoulder due to pain, tolerated full abduction some swelling over his right elbow. Sensation intact.  Motor 5/5 in all muscle groups except right tricep(4/5). Hand grips 5/5.  Laboratory:  Recent Labs Lab 04/17/15 0948 04/18/15 0535  WBC 5.0 5.7  HGB 14.7 14.6  HCT 41.9 42.2  PLT 375 366    Recent Labs Lab 04/17/15 0948 04/17/15 1830 04/18/15 0535 04/18/15 1655  NA 140 136 137  --   K 2.9* 3.0* 2.7* 3.0*  CL 100* 99* 98*  --   CO2 30 31 30   --   BUN 21* 17 16  --   CREATININE 1.63* 1.49* 1.55*  --   CALCIUM 9.2 8.7* 8.9  --   PROT  --  7.0  --   --   BILITOT  --  0.7  --   --   ALKPHOS  --  44  --   --   ALT  --  72*  --   --   AST  --  56*  --   --   GLUCOSE 109* 115* 100*  --     Imaging/Diagnostic Tests: No results found.  Mercy Riding, MD 04/19/2015, 7:07 AM PGY-1, [place] Intern pager: [phone], text pages welcome

## 2015-04-19 NOTE — Care Management Note (Signed)
Case Management Note  Patient Details  Name: Corey Guerrero MRN: [id number] Date of Birth: 11/07/1955  Subjective/Objective:     Pt admitted for HTN. Pt is set up at the clinic and PCP is MD Funches.                Action/Plan: CM did set up a hospital f/u scheduled for 10-31@ 10:00 am. No further needs from CM at this time.    Expected Discharge Date:                  Expected Discharge Plan:  Home/Self Care  In-House Referral:  NA  Discharge planning Services  CM Consult, Follow-up appt scheduled, [facility], Medication Assistance  Post Acute Care Choice:  NA Choice offered to:  NA  DME Arranged:  N/A DME Agency:  NA  HH Arranged:  NA HH Agency:  NA  Status of Service:  Completed, signed off  Medicare Important Message Given:    Date Medicare IM Given:    Medicare IM give by:    Date Additional Medicare IM Given:    Additional Medicare Important Message give by:     If discussed at [place] of Stay Meetings, dates discussed:    Additional Comments:  Bethena Roys, RN 04/19/2015, 10:32 AM

## 2015-04-19 NOTE — Discharge Summary (Signed)
[facility] Discharge Summary  Patient name: Corey Guerrero Medical record number: [id number] Date of birth: 1956/03/21 Age: 59 y.o. Gender: male Date of Admission: 04/17/2015  Date of Discharge: 04/19/2015 Admitting Physician: Alveda Reasons, MD  Primary Care Provider: Minerva Ends, MD Consultants: none  Indication for Hospitalization: chest pain, right arm pain and accelerated hypertension  Discharge Diagnoses/Problem List:  Patient Active Problem List   Diagnosis Date Noted  . Chest pain   . Pain in the chest   . Accelerated hypertension   . Muscle right arm weakness   . Olecranon bursitis of right elbow 01/19/2015  . Pain in joint, shoulder region 01/19/2015  . Erectile dysfunction 01/19/2015  . CKD (chronic kidney disease) 10/15/2011  . Polysubstance abuse      Disposition: home  Discharge Condition: stable  Discharge Exam: Gen: NAD, well-appearing Eyes: PERRLA, sclera anicteric, no conjunctival injection Ear: normal TM and ear canal  Nares: clear, no erythema, swelling or congestion Oropharynx: clear, moist Chest: tender to palpation over right chest, pain with moving his right arm over his head CV: RRR. S1 & S2 audible, no murmurs. Resp: no apparent WOB, CTAB. Abd: +BS. Soft, NDNT, no rebound or guarding.  Ext: atrophy of right triceps muscle. No edema or gross deformities. Neuro: Alert, flexion, extension, internal and external rotation limited in right shoulder due to pain, tolerated full abduction some swelling over his right elbow. Sensation intact. Motor 5/5 in all muscle groups except right tricep(4/5). Hand grips 5/5.  Brief Hospital Course:  Corey Guerrero is a 59 y.o. male presenting with chest pain and hypertensive urgency in the setting of not taking his blood pressure medicines. PMH is significant for hypertension, CKD II - III, GERD and substance use (sober now)  Chest pain/right arm pain: improved. Likely  MSK. Pain with moving his right arm. Also right arm weakness and atrophy of right triceps since he dropped a dumbbell on himself about three months ago. Had X-ray of his right elbow in 7/16 and 8/16 that was negative for fractures but bursitis and soft tissue swelling. HEART score of 4. However, pain is nonanginal, positional and reproducible, and there is no acute EKG changes and his troponin were negative three times. CTA chest negative for Aortic dissection PE unlikely with no tachycardia, tachypnea and risk factors. Pain not pleuritic either. CXR normal. Echo EF of 55-60%, grade 1 DD, mild LVH and no significant valvular dysfunction. His Echo is improved from his previous Echo in 2012. CBC and UDS negative as well. Ordered MRI of humerus. Patient claustrophobic, and declined MRI even with sedation saying his pain has improved. Discharged home on tramadol 50 mg tid and recommendation to follow up with sport medicine.  Accelerated Hypertension: improved. BP 211/132 mHg on presentation. Has been out of amlodipine 10 mg for one week and HCTZ for two days that could have contributed to this. Concern for OSA as he reports snoring at night as well. No neurologic deficit on exam except for some weakness in his right triceps. No sign of new end-organ damage. Chart review with borderline high BP's in the clinic. Reduced his HCTZ to 12.5 mg daily due to chronic hypokalemia. Stopped his metoprolol due to bradycardia although this could be exercise related. Added lisinopril 20 mg. This could also help his chronic hypokalemia.  Hypokalemia:K 2.9 on presentation, then down to 2.7 the follow day despite repletion. He has chronically low K. Baseline around 3. Likely from HCTZ, which was  reduced to 12.5 mg daily. Repleted with IV K 10 mEq time 6 and discharged home on oral Klor-con 20 mEq bid for three days. His potassium on discharge was 3.1.  Positive Hep C screening:  ordered viral load with reflex for genotype. Also  ordered HBV serology. Recommend following up on this as he can benefit from treatment.  Other chronic conditions were stable.  Issues for Follow Up:  1. Right arm pain: recommend referral to sport medicine if no improvement 2. Hypertension: changed his medications. Adjust doses as needed. BMP at follow up. 3. Hypokalemia: hope this to improve as we decreased his HCTZ to 12.5   mg and introduced Lisinopril. Check K at follow up. 4. Hepatitis Antibody positive: ordered viral load and genotype. Also order Hep B serology. Results were pending at the time of discharge. Follow up results and treat if detectable viral load.   Significant Procedures: none  Significant Labs and Imaging:   Recent Labs Lab 04/17/15 0948 04/18/15 0535  WBC 5.0 5.7  HGB 14.7 14.6  HCT 41.9 42.2  PLT 375 366    Recent Labs Lab 04/17/15 0948 04/17/15 1830 04/18/15 0535 04/18/15 0912 04/18/15 1655 04/19/15 1055  NA 140 136 137  --   --  134*  K 2.9* 3.0* 2.7*  --  3.0* 3.1*  CL 100* 99* 98*  --   --  94*  CO2 30 31 30   --   --  32  GLUCOSE 109* 115* 100*  --   --  108*  BUN 21* 17 16  --   --  18  CREATININE 1.63* 1.49* 1.55*  --   --  1.61*  CALCIUM 9.2 8.7* 8.9  --   --  9.2  MG  --   --   --  1.8  --   --   ALKPHOS  --  44  --   --   --   --   AST  --  56*  --   --   --   --   ALT  --  72*  --   --   --   --   ALBUMIN  --  3.5  --   --   --   --      Results/Tests Pending at Time of Discharge: Hepatitis C viral load. Hepatitis B serology  Discharge Medications:    Medication List    STOP taking these medications        metoprolol tartrate 25 MG tablet  Commonly known as:  LOPRESSOR      TAKE these medications        amLODipine 10 MG tablet  Commonly known as:  NORVASC  Take 1 tablet (10 mg total) by mouth daily.     aspirin 81 MG chewable tablet  Chew 1 tablet (81 mg total) by mouth daily.     cyclobenzaprine 10 MG tablet  Commonly known as:  FLEXERIL  Take 1 tablet (10 mg  total) by mouth 2 (two) times daily as needed for muscle spasms.     diclofenac sodium 1 % Gel  Commonly known as:  VOLTAREN  Apply 2 g topically 4 (four) times daily.     hydrochlorothiazide 12.5 MG tablet  Commonly known as:  HYDRODIURIL  Take 1 tablet (12.5 mg total) by mouth daily.     lisinopril 20 MG tablet  Commonly known as:  PRINIVIL,ZESTRIL  Take 1 tablet (20 mg total) by mouth daily.  multivitamin with minerals tablet  Take 1 tablet by mouth daily.     omega-3 acid ethyl esters 1 G capsule  Commonly known as:  LOVAZA  Take 1 g by mouth 2 (two) times daily.     potassium chloride SA 20 MEQ tablet  Commonly known as:  K-DUR,KLOR-CON  Take 1 tablet (20 mEq total) by mouth daily.     potassium chloride SA 20 MEQ tablet  Commonly known as:  K-DUR,KLOR-CON  Take 1 tablet (20 mEq total) by mouth 2 (two) times daily.     traMADol 50 MG tablet  Commonly known as:  ULTRAM  Take 1 tablet (50 mg total) by mouth every 6 (six) hours as needed.     VITAMIN B 12 PO  Take 1 tablet by mouth daily.        Discharge Instructions: Please refer to Patient Instructions section of EMR for full details.  Patient was counseled important signs and symptoms that should prompt return to medical care, changes in medications, dietary instructions, activity restrictions, and follow up appointments.   Follow-Up Appointments:     Follow-up Information    Follow up with Minerva Ends, MD. Call in 2 days.   Specialty:  Family Medicine   Contact information:   [place]  [phone]       Follow up with [facility] On 05/01/2015.   Why:  Hospital Follow UP  @ 10:00 am with Dr. Adrian Blackwater.    Contact information:   [place] [phone]      Mercy Riding, MD 04/19/2015, 3:30 PM PGY-1, [place]

## 2015-04-19 NOTE — Progress Notes (Signed)
  Echocardiogram 2D Echocardiogram has been performed.  Corey Guerrero 04/19/2015, 10:07 AM

## 2015-04-19 NOTE — Discharge Instructions (Signed)
It has been a pleasure taking care of you! You were admitted due to chest and right arm pain. After performing medical exams and tests we think your symptoms are not related to your heart. It is likely that this pain is from muscle injury in the past. Since your pain has improved we are discharging you so that you can follow up with your primary care doctor. We also recommed following up with sport medicine doctor for better evaluation and treatment.  You also had elevated blood pressure when you came in. We have changed your blood pressure medications. The names and directions on how to take these medications are found on this discharge paper under medication section.  You also need a follow up with your primary care physician. Please, given them a call to schedule a hospital follow up as soon as possible.   Take care,

## 2015-04-20 LAB — HEPATITIS B CORE ANTIBODY, TOTAL: Hep B Core Total Ab: NEGATIVE

## 2015-04-20 LAB — HEPATITIS B SURFACE ANTIBODY,QUALITATIVE: Hep B S Ab: NONREACTIVE

## 2015-04-20 LAB — HEPATITIS B SURFACE ANTIGEN: Hepatitis B Surface Ag: NEGATIVE

## 2015-04-24 LAB — HCV RNA QUANT RFLX ULTRA OR GENOTYP
HCV RNA Qnt(log copy/mL): 6.188 log10 IU/mL
HepC Qn: 1540000 IU/mL

## 2015-04-24 LAB — HEPATITIS C GENOTYPE

## 2015-04-26 ENCOUNTER — Telehealth: Payer: Self-pay | Admitting: *Deleted

## 2015-04-26 NOTE — Telephone Encounter (Signed)
-----   Message from Boykin Nearing, MD sent at 04/20/2015  8:38 AM EDT ----- Negative Hep B testing  Recommend patient initiate vaccine series at f/u

## 2015-04-26 NOTE — Telephone Encounter (Signed)
Unable to contact pt Not accepting calls

## 2015-04-28 ENCOUNTER — Ambulatory Visit: Payer: Self-pay | Attending: Family Medicine | Admitting: Internal Medicine

## 2015-04-28 ENCOUNTER — Encounter: Payer: Self-pay | Admitting: Family Medicine

## 2015-04-28 VITALS — BP 188/112 | HR 41 | Temp 97.6°F | Resp 20 | Ht 66.0 in | Wt 170.0 lb

## 2015-04-28 DIAGNOSIS — M79621 Pain in right upper arm: Secondary | ICD-10-CM

## 2015-04-28 DIAGNOSIS — E876 Hypokalemia: Secondary | ICD-10-CM

## 2015-04-28 DIAGNOSIS — M25521 Pain in right elbow: Secondary | ICD-10-CM

## 2015-04-28 DIAGNOSIS — M25529 Pain in unspecified elbow: Secondary | ICD-10-CM | POA: Insufficient documentation

## 2015-04-28 DIAGNOSIS — I10 Essential (primary) hypertension: Secondary | ICD-10-CM | POA: Insufficient documentation

## 2015-04-28 DIAGNOSIS — N182 Chronic kidney disease, stage 2 (mild): Secondary | ICD-10-CM

## 2015-04-28 DIAGNOSIS — R001 Bradycardia, unspecified: Secondary | ICD-10-CM | POA: Insufficient documentation

## 2015-04-28 MED ORDER — CLONIDINE HCL 0.1 MG PO TABS
0.1000 mg | ORAL_TABLET | Freq: Once | ORAL | Status: AC
  Administered 2015-04-28: 0.1 mg via ORAL

## 2015-04-28 MED ORDER — HYDROCHLOROTHIAZIDE 12.5 MG PO TABS: 12.5000 mg | ORAL_TABLET | Freq: Every day | ORAL | Status: DC

## 2015-04-28 MED ORDER — LISINOPRIL 20 MG PO TABS: 20.0000 mg | ORAL_TABLET | Freq: Every day | ORAL | Status: DC

## 2015-04-28 MED ORDER — AMLODIPINE BESYLATE 10 MG PO TABS: 10.0000 mg | ORAL_TABLET | Freq: Every day | ORAL | Status: DC

## 2015-04-28 MED ORDER — HYDRALAZINE HCL 50 MG PO TABS: 50.0000 mg | ORAL_TABLET | Freq: Three times a day (TID) | ORAL | Status: DC

## 2015-04-28 MED ORDER — TRAMADOL HCL 50 MG PO TABS: 50.0000 mg | ORAL_TABLET | Freq: Two times a day (BID) | ORAL | Status: DC | PRN

## 2015-04-28 MED ORDER — POTASSIUM CHLORIDE CRYS ER 20 MEQ PO TBCR: 20.0000 meq | EXTENDED_RELEASE_TABLET | Freq: Every day | ORAL | Status: DC

## 2015-04-28 NOTE — Progress Notes (Signed)
Patient is here for HFU for HTN. Patient denies pain at this time. Patient complains of pain in bilateral arms. Patient complains of pulling a muscle in his chest from dropping weights while working out. Patient states pain arises when picking up items.  Patient took BP med at 6:00am. Patient states Pulse range is 45-50 for his normal.  BP recheck: 209/112   Refill on Amlodipine, Aspirin 81, Tramadol, Potassium,
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
## 2015-04-28 NOTE — Patient Instructions (Signed)
DASH Eating Plan °DASH stands for "Dietary Approaches to Stop Hypertension." The DASH eating plan is a healthy eating plan that has been shown to reduce high blood pressure (hypertension). Additional health benefits may include reducing the risk of type 2 diabetes mellitus, heart disease, and stroke. The DASH eating plan may also help with weight loss. °WHAT DO I NEED TO KNOW ABOUT THE DASH EATING PLAN? °For the DASH eating plan, you will follow these general guidelines: °· Choose foods with a percent daily value for sodium of less than 5% (as listed on the food label). °· Use salt-free seasonings or herbs instead of table salt or sea salt. °· Check with your health care provider or pharmacist before using salt substitutes. °· Eat lower-sodium products, often labeled as "lower sodium" or "no salt added." °· Eat fresh foods. °· Eat more vegetables, fruits, and low-fat dairy products. °· Choose whole grains. Look for the word "whole" as the first word in the ingredient list. °· Choose fish and skinless chicken or turkey more often than red meat. Limit fish, poultry, and meat to 6 oz (170 g) each day. °· Limit sweets, desserts, sugars, and sugary drinks. °· Choose heart-healthy fats. °· Limit cheese to 1 oz (28 g) per day. °· Eat more home-cooked food and less restaurant, buffet, and fast food. °· Limit fried foods. °· Cook foods using methods other than frying. °· Limit canned vegetables. If you do use them, rinse them well to decrease the sodium. °· When eating at a restaurant, ask that your food be prepared with less salt, or no salt if possible. °WHAT FOODS CAN I EAT? °Seek help from a dietitian for individual calorie needs. °Grains °Whole grain or whole wheat bread. Brown rice. Whole grain or whole wheat pasta. Quinoa, bulgur, and whole grain cereals. Low-sodium cereals. Corn or whole wheat flour tortillas. Whole grain cornbread. Whole grain crackers. Low-sodium crackers. °Vegetables °Fresh or frozen vegetables  (raw, steamed, roasted, or grilled). Low-sodium or reduced-sodium tomato and vegetable juices. Low-sodium or reduced-sodium tomato sauce and paste. Low-sodium or reduced-sodium canned vegetables.  °Fruits °All fresh, canned (in natural juice), or frozen fruits. °Meat and Other Protein Products °Ground beef (85% or leaner), grass-fed beef, or beef trimmed of fat. Skinless chicken or turkey. Ground chicken or turkey. Pork trimmed of fat. All fish and seafood. Eggs. Dried beans, peas, or lentils. Unsalted nuts and seeds. Unsalted canned beans. °Dairy °Low-fat dairy products, such as skim or 1% milk, 2% or reduced-fat cheeses, low-fat ricotta or cottage cheese, or plain low-fat yogurt. Low-sodium or reduced-sodium cheeses. °Fats and Oils °Tub margarines without trans fats. Light or reduced-fat mayonnaise and salad dressings (reduced sodium). Avocado. Safflower, olive, or canola oils. Natural peanut or almond butter. °Other °Unsalted popcorn and pretzels. °The items listed above may not be a complete list of recommended foods or beverages. Contact your dietitian for more options. °WHAT FOODS ARE NOT RECOMMENDED? °Grains °White bread. White pasta. White rice. Refined cornbread. Bagels and croissants. Crackers that contain trans fat. °Vegetables °Creamed or fried vegetables. Vegetables in a cheese sauce. Regular canned vegetables. Regular canned tomato sauce and paste. Regular tomato and vegetable juices. °Fruits °Dried fruits. Canned fruit in light or heavy syrup. Fruit juice. °Meat and Other Protein Products °Fatty cuts of meat. Ribs, chicken wings, bacon, sausage, bologna, salami, chitterlings, fatback, hot dogs, bratwurst, and packaged luncheon meats. Salted nuts and seeds. Canned beans with salt. °Dairy °Whole or 2% milk, cream, half-and-half, and cream cheese. Whole-fat or sweetened yogurt. Full-fat   cheeses or blue cheese. Nondairy creamers and whipped toppings. Processed cheese, cheese spreads, or cheese  curds. °Condiments °Onion and garlic salt, seasoned salt, table salt, and sea salt. Canned and packaged gravies. Worcestershire sauce. Tartar sauce. Barbecue sauce. Teriyaki sauce. Soy sauce, including reduced sodium. Steak sauce. Fish sauce. Oyster sauce. Cocktail sauce. Horseradish. Ketchup and mustard. Meat flavorings and tenderizers. Bouillon cubes. Hot sauce. Tabasco sauce. Marinades. Taco seasonings. Relishes. °Fats and Oils °Butter, stick margarine, lard, shortening, ghee, and bacon fat. Coconut, palm kernel, or palm oils. Regular salad dressings. °Other °Pickles and olives. Salted popcorn and pretzels. °The items listed above may not be a complete list of foods and beverages to avoid. Contact your dietitian for more information. °WHERE CAN I FIND MORE INFORMATION? °[employer]: www.nhlbi.nih.gov/health/health-topics/topics/dash/ °  °This information is not intended to replace advice given to you by your health care provider. Make sure you discuss any questions you have with your health care provider. °  °Document Released: 06/06/2011 Document Revised: 07/08/2014 Document Reviewed: 04/21/2013 °Elsevier Interactive Patient Education ©2016 [employer] ° °Hypertension °Hypertension, commonly called high blood pressure, is when the force of blood pumping through your arteries is too strong. Your arteries are the blood vessels that carry blood from your heart throughout your body. A blood pressure reading consists of a higher number over a lower number, such as 110/72. The higher number (systolic) is the pressure inside your arteries when your heart pumps. The lower number (diastolic) is the pressure inside your arteries when your heart relaxes. Ideally you want your blood pressure below 120/80. °Hypertension forces your heart to work harder to pump blood. Your arteries may become narrow or stiff. Having untreated or uncontrolled hypertension can cause heart attack, stroke, kidney  disease, and other problems. °RISK FACTORS °Some risk factors for high blood pressure are controllable. Others are not.  °Risk factors you cannot control include:  °· Race. You may be at higher risk if you are African American. °· Age. Risk increases with age. °· Gender. Men are at higher risk than women before age 45 years. After age 65, women are at higher risk than men. °Risk factors you can control include: °· Not getting enough exercise or physical activity. °· Being overweight. °· Getting too much fat, sugar, calories, or salt in your diet. °· Drinking too much alcohol. °SIGNS AND SYMPTOMS °Hypertension does not usually cause signs or symptoms. Extremely high blood pressure (hypertensive crisis) may cause headache, anxiety, shortness of breath, and nosebleed. °DIAGNOSIS °To check if you have hypertension, your health care provider will measure your blood pressure while you are seated, with your arm held at the level of your heart. It should be measured at least twice using the same arm. Certain conditions can cause a difference in blood pressure between your right and left arms. A blood pressure reading that is higher than normal on one occasion does not mean that you need treatment. If it is not clear whether you have high blood pressure, you may be asked to return on a different day to have your blood pressure checked again. Or, you may be asked to monitor your blood pressure at home for 1 or more weeks. °TREATMENT °Treating high blood pressure includes making lifestyle changes and possibly taking medicine. Living a healthy lifestyle can help lower high blood pressure. You may need to change some of your habits. °Lifestyle changes may include: °· Following the DASH diet. This diet is high in fruits, vegetables, and whole   grains. It is low in salt, red meat, and added sugars. °· Keep your sodium intake below 2,300 mg per day. °· Getting at least 30-45 minutes of aerobic exercise at least 4 times per  week. °· Losing weight if necessary. °· Not smoking. °· Limiting alcoholic beverages. °· Learning ways to reduce stress. °Your health care provider may prescribe medicine if lifestyle changes are not enough to get your blood pressure under control, and if one of the following is true: °· You are 18-59 years of age and your systolic blood pressure is above 140. °· You are 60 years of age or older, and your systolic blood pressure is above 150. °· Your diastolic blood pressure is above 90. °· You have diabetes, and your systolic blood pressure is over 140 or your diastolic blood pressure is over 90. °· You have kidney disease and your blood pressure is above 140/90. °· You have heart disease and your blood pressure is above 140/90. °Your personal target blood pressure may vary depending on your medical conditions, your age, and other factors. °HOME CARE INSTRUCTIONS °· Have your blood pressure rechecked as directed by your health care provider.   °· Take medicines only as directed by your health care provider. Follow the directions carefully. Blood pressure medicines must be taken as prescribed. The medicine does not work as well when you skip doses. Skipping doses also puts you at risk for problems. °· Do not smoke.   °· Monitor your blood pressure at home as directed by your health care provider.  °SEEK MEDICAL CARE IF:  °· You think you are having a reaction to medicines taken. °· You have recurrent headaches or feel dizzy. °· You have swelling in your ankles. °· You have trouble with your vision. °SEEK IMMEDIATE MEDICAL CARE IF: °· You develop a severe headache or confusion. °· You have unusual weakness, numbness, or feel faint. °· You have severe chest or abdominal pain. °· You vomit repeatedly. °· You have trouble breathing. °MAKE SURE YOU:  °· Understand these instructions. °· Will watch your condition. °· Will get help right away if you are not doing well or get worse. °  °This information is not intended to  replace advice given to you by your health care provider. Make sure you discuss any questions you have with your health care provider. °  °Document Released: 06/17/2005 Document Revised: 11/01/2014 Document Reviewed: 04/09/2013 °Elsevier Interactive Patient Education ©2016 [employer] Inc[employer] ° °

## 2015-05-01 ENCOUNTER — Inpatient Hospital Stay: Payer: Self-pay | Admitting: Family Medicine

## 2015-05-11 ENCOUNTER — Ambulatory Visit: Payer: Self-pay

## 2015-05-11 ENCOUNTER — Ambulatory Visit: Payer: Self-pay | Attending: Family Medicine | Admitting: Pharmacist

## 2015-05-11 VITALS — BP 134/86 | HR 70

## 2015-05-11 DIAGNOSIS — Z79899 Other long term (current) drug therapy: Secondary | ICD-10-CM | POA: Insufficient documentation

## 2015-05-11 DIAGNOSIS — I10 Essential (primary) hypertension: Secondary | ICD-10-CM | POA: Insufficient documentation

## 2015-05-11 NOTE — Progress Notes (Signed)
S:    Patient arrives in good spirits.    Presents to the clinic for hypertension evaluation.   Patient reports adherence with medications.  Current BP Medications include:  Amlodipine 10 mg daily, hydralazine 50 mg TID, lisinopril 20 mg daily.   Patient reports that he has been in a lot of pain from muscle tears in his arms. He reports that the tramadol does not work for him and that he is not supposed to take NSAIDs due to kidney issues.     O:   Last 3 Office BP readings: BP Readings from Last 3 Encounters:  05/11/15 134/86  04/28/15 188/112  04/19/15 140/94    BMET    Component Value Date/Time   NA 134* 04/19/2015 1055   K 3.1* 04/19/2015 1055   CL 94* 04/19/2015 1055   CO2 32 04/19/2015 1055   GLUCOSE 108* 04/19/2015 1055   BUN 18 04/19/2015 1055   CREATININE 1.61* 04/19/2015 1055   CREATININE 1.72* 03/15/2015 0942   CALCIUM 9.2 04/19/2015 1055   GFRNONAA 46* 04/19/2015 1055   GFRAA 53* 04/19/2015 1055    A/P: History of hypertension currently controlled on current medications.  Will not make any changes at this time. Patient instructed to continue to take all medications prescribed. If patient would need additional agent for blood pressure in the future, would consider spironolactone due to low potassium despite taking an ACEi and potassium supplements. Would uptitrate the ACEi first though.   Patient will follow up with Dr. Doreene Burke for pain. It has not changed and is a chronic condition.    Results reviewed and written information provided.   Total time in face-to-face counseling 20 minutes.   F/U Clinic Visit with Dr. Doreene Burke.

## 2015-05-11 NOTE — Patient Instructions (Signed)
Thanks for coming to see me today!  Your blood pressure looks great! Keep taking all of your medications   Follow up with Dr. Doreene Burke for pain

## 2015-05-31 ENCOUNTER — Ambulatory Visit: Payer: Self-pay

## 2015-06-06 ENCOUNTER — Ambulatory Visit: Payer: Self-pay | Admitting: Family Medicine

## 2015-06-15 ENCOUNTER — Ambulatory Visit: Payer: Self-pay | Attending: Family Medicine | Admitting: Family Medicine

## 2015-06-15 ENCOUNTER — Encounter: Payer: Self-pay | Admitting: Family Medicine

## 2015-06-15 ENCOUNTER — Other Ambulatory Visit: Payer: Self-pay

## 2015-06-15 VITALS — BP 160/110 | HR 59 | Temp 98.4°F | Resp 16 | Ht 66.0 in | Wt 175.0 lb

## 2015-06-15 DIAGNOSIS — E876 Hypokalemia: Secondary | ICD-10-CM | POA: Insufficient documentation

## 2015-06-15 DIAGNOSIS — Z114 Encounter for screening for human immunodeficiency virus [HIV]: Secondary | ICD-10-CM | POA: Insufficient documentation

## 2015-06-15 DIAGNOSIS — B9789 Other viral agents as the cause of diseases classified elsewhere: Secondary | ICD-10-CM

## 2015-06-15 DIAGNOSIS — Z7982 Long term (current) use of aspirin: Secondary | ICD-10-CM | POA: Insufficient documentation

## 2015-06-15 DIAGNOSIS — J069 Acute upper respiratory infection, unspecified: Secondary | ICD-10-CM | POA: Insufficient documentation

## 2015-06-15 DIAGNOSIS — I10 Essential (primary) hypertension: Secondary | ICD-10-CM | POA: Insufficient documentation

## 2015-06-15 LAB — COMPLETE METABOLIC PANEL WITH GFR
ALT: 34 U/L (ref 9–46)
AST: 40 U/L — ABNORMAL HIGH (ref 10–35)
Albumin: 3.9 g/dL (ref 3.6–5.1)
Alkaline Phosphatase: 48 U/L (ref 40–115)
BUN: 17 mg/dL (ref 7–25)
CO2: 26 mmol/L (ref 20–31)
Calcium: 9.2 mg/dL (ref 8.6–10.3)
Chloride: 101 mmol/L (ref 98–110)
Creat: 1.42 mg/dL — ABNORMAL HIGH (ref 0.70–1.33)
GFR, Est African American: 62 mL/min (ref >=60)
GFR, Est Non African American: 54 mL/min — ABNORMAL LOW (ref >=60)
Glucose, Bld: 102 mg/dL — ABNORMAL HIGH (ref 65–99)
Potassium: 3.8 mmol/L (ref 3.5–5.3)
Sodium: 138 mmol/L (ref 135–146)
Total Bilirubin: 0.6 mg/dL (ref 0.2–1.2)
Total Protein: 7.3 g/dL (ref 6.1–8.1)

## 2015-06-15 LAB — POCT URINALYSIS DIPSTICK
Bilirubin, UA: NEGATIVE
Glucose, UA: NEGATIVE
Ketones, UA: NEGATIVE
Leukocytes, UA: NEGATIVE
Nitrite, UA: NEGATIVE
Protein, UA: 100
Spec Grav, UA: 1.02
Urobilinogen, UA: 0.2
pH, UA: 7

## 2015-06-15 LAB — MAGNESIUM: Magnesium: 1.9 mg/dL (ref 1.5–2.5)

## 2015-06-15 MED ORDER — HYDRALAZINE HCL 50 MG PO TABS: 50.0000 mg | ORAL_TABLET | Freq: Three times a day (TID) | ORAL | Status: DC

## 2015-06-15 MED ORDER — LISINOPRIL 20 MG PO TABS: 20.0000 mg | ORAL_TABLET | Freq: Every day | ORAL | Status: DC

## 2015-06-15 MED ORDER — SPIRONOLACTONE 25 MG PO TABS: 12.5000 mg | ORAL_TABLET | Freq: Every day | ORAL | Status: DC

## 2015-06-15 MED ORDER — AMLODIPINE BESYLATE 10 MG PO TABS: 10.0000 mg | ORAL_TABLET | Freq: Every day | ORAL | Status: DC

## 2015-06-15 MED ORDER — CLONIDINE HCL 0.1 MG PO TABS
0.1000 mg | ORAL_TABLET | Freq: Once | ORAL | Status: AC
  Administered 2015-06-15: 0.1 mg via ORAL

## 2015-06-15 MED ORDER — ASPIRIN 81 MG PO CHEW: 81.0000 mg | CHEWABLE_TABLET | Freq: Every day | ORAL | Status: DC

## 2015-06-15 NOTE — Progress Notes (Signed)
Patient ID: Corey Guerrero, male   DOB: Jun 08, 1956, 59 y.o.   MRN: [id number]   Subjective:  Patient ID: Corey Guerrero, male    DOB: 1955-08-09  Age: 59 y.o. MRN: [id number]  CC: Hypertension and Cough   HPI Corey Guerrero presents for    1. CHRONIC HYPERTENSION  Disease Monitoring  Blood pressure range: not checking   Chest pain: no   Dyspnea: no   Claudication: no   Medication compliance: no, no meds x 2 weeks.  Medication Side Effects  Lightheadedness: yes   Urinary frequency: no   Edema: no   Impotence: yes   Preventitive Healthcare:  Exercise: yes   Diet Pattern: eating light   Salt Restriction: yes   2. Body aches, dry cough and rhinorrhea x 2 days . No fever,chills or CP when he is not cough. Pain only with cough. Pain from waist up. Took theraflu and night time cold and flu OTC. Hoarseness. Denies sore throat.    Social History  Substance Use Topics  . Smoking status: Never Smoker   . Smokeless tobacco: Never Used  . 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 cyclobenzaprine (FLEXERIL) 10 MG tablet Take 1 tablet (10 mg total) by mouth 2 (two) times daily as needed for muscle spasms. 30 tablet 1  . Multiple Vitamins-Minerals (MULTIVITAMIN WITH MINERALS) tablet Take 1 tablet by mouth daily. Reported on 06/15/2015    . omega-3 acid ethyl esters (LOVAZA) 1 G capsule Take 1 g by mouth 2 (two) times daily. Reported on 06/15/2015    . amLODipine (NORVASC) 10 MG tablet Take 1 tablet (10 mg total) by mouth daily. (Patient not taking: Reported on 06/15/2015) 90 tablet 3  . aspirin 81 MG chewable tablet Chew 1 tablet (81 mg total) by mouth daily. (Patient not taking: Reported on 06/15/2015) 30 tablet 5  . Cyanocobalamin (VITAMIN B 12 PO) Take 1 tablet by mouth daily. Reported on 06/15/2015    . diclofenac sodium (VOLTAREN) 1 % GEL Apply 2 g topically 4 (four) times daily.  (Patient not taking: Reported on 06/15/2015) 100 g 1  . hydrALAZINE (APRESOLINE) 50 MG tablet Take 1 tablet (50 mg total) by mouth 3 (three) times daily. (Patient not taking: Reported on 06/15/2015) 90 tablet 3  . hydrochlorothiazide (HYDRODIURIL) 12.5 MG tablet Take 1 tablet (12.5 mg total) by mouth daily. (Patient not taking: Reported on 06/15/2015) 90 tablet 3  . lisinopril (PRINIVIL,ZESTRIL) 20 MG tablet Take 1 tablet (20 mg total) by mouth daily. (Patient not taking: Reported on 06/15/2015) 90 tablet 3  . potassium chloride SA (K-DUR,KLOR-CON) 20 MEQ tablet Take 1 tablet (20 mEq total) by mouth daily. (Patient not taking: Reported on 06/15/2015) 30 tablet 3  . traMADol (ULTRAM) 50 MG tablet Take 1 tablet (50 mg total) by mouth every 12 (twelve) hours as needed. (Patient not taking: Reported on 06/15/2015) 30 tablet 0   No facility-administered medications prior to visit.    ROS Review of Systems  Constitutional: Negative for fever, chills, fatigue and unexpected weight change.  HENT: Positive for rhinorrhea.   Eyes: Negative for visual disturbance.  Respiratory: Positive for cough. Negative for shortness of breath.   Cardiovascular: Negative for chest pain, palpitations and leg swelling.  Gastrointestinal: Negative for nausea, vomiting, abdominal pain, diarrhea, constipation and blood in stool.  Endocrine: Negative for polydipsia, polyphagia and  polyuria.  Musculoskeletal: Positive for myalgias and arthralgias. Negative for back pain, gait problem and neck pain.  Skin: Negative for rash.  Allergic/Immunologic: Negative for immunocompromised state.  Neurological: Positive for dizziness, light-headedness and headaches.  Hematological: Negative for adenopathy. Does not bruise/bleed easily.  Psychiatric/Behavioral: Negative for suicidal ideas, sleep disturbance and dysphoric mood. The patient is not nervous/anxious.     Objective:  BP 160/110 mmHg  Pulse 63  Temp(Src) 98.4 F (36.9 C)  (Oral)  Resp 16  Ht 5\' 6"  (1.676 m)  Wt 175 lb (79.379 kg)  BMI 28.26 kg/m2  SpO2 98%  BP/Weight 06/15/2015 05/11/2015 [id number]  Systolic BP 0000000 [id number] 0000000  Diastolic BP [id number]  Wt. (Lbs) 175 - 170  BMI 28.26 - 27.45   Physical Exam  Constitutional: He appears well-developed and well-nourished. No distress.  HENT:  Head: Normocephalic and atraumatic.  Right Ear: Tympanic membrane and ear canal normal.  Left Ear: Tympanic membrane, external ear and ear canal normal.  Nose: No mucosal edema.  Mouth/Throat: Oropharynx is clear and moist and mucous membranes are normal.  Neck: Normal range of motion. Neck supple.  Cardiovascular: Normal rate, regular rhythm, normal heart sounds and intact distal pulses.   Pulmonary/Chest: Effort normal and breath sounds normal.  Musculoskeletal: He exhibits no edema.  Neurological: He is alert.  Skin: Skin is warm and dry. No rash noted. No erythema.  Psychiatric: He has a normal mood and affect.   EKG: sinus bradycardia, prolonged QT interval, left axis deviation. UA: 100 protein, trace blood   Treated with 0.1 mg of clonidine  Repeat vital 160/110 with HR of 59. Patient reported HA after clonidine   Plan D/c home with meds and close follow up  Assessment & Plan:   Corey Guerrero was seen today for hypertension and cough.  Diagnoses and all orders for this visit:  Uncontrolled hypertension -     POCT urinalysis dipstick -     COMPLETE METABOLIC PANEL WITH GFR -     spironolactone (ALDACTONE) 25 MG tablet; Take 0.5 tablets (12.5 mg total) by mouth daily. -     amLODipine (NORVASC) 10 MG tablet; Take 1 tablet (10 mg total) by mouth daily. -     hydrALAZINE (APRESOLINE) 50 MG tablet; Take 1 tablet (50 mg total) by mouth 3 (three) times daily. -     lisinopril (PRINIVIL,ZESTRIL) 20 MG tablet; Take 1 tablet (20 mg total) by mouth daily. -     cloNIDine (CATAPRES) tablet 0.1 mg; Take 1 tablet (0.1 mg total) by mouth once.  Hypokalemia -      Magnesium  Screening for HIV (human immunodeficiency virus) -     HIV antibody (with reflex)  Viral URI with cough    No orders of the defined types were placed in this encounter.    Follow-up: Return in about 2 weeks (around 06/29/2015) for HTN, pharmacy BP check .   Boykin Nearing MD

## 2015-06-15 NOTE — Patient Instructions (Addendum)
Corey Guerrero was seen today for hypertension and cough.  Diagnoses and all orders for this visit:  Uncontrolled hypertension -     POCT urinalysis dipstick -     COMPLETE METABOLIC PANEL WITH GFR -     spironolactone (ALDACTONE) 25 MG tablet; Take 0.5 tablets (12.5 mg total) by mouth daily. -     amLODipine (NORVASC) 10 MG tablet; Take 1 tablet (10 mg total) by mouth daily. -     hydrALAZINE (APRESOLINE) 50 MG tablet; Take 1 tablet (50 mg total) by mouth 3 (three) times daily. -     lisinopril (PRINIVIL,ZESTRIL) 20 MG tablet; Take 1 tablet (20 mg total) by mouth daily. -     cloNIDine (CATAPRES) tablet 0.1 mg; Take 1 tablet (0.1 mg total) by mouth once. -     EKG 12-Lead; Standing -     EKG 12-Lead -     aspirin 81 MG chewable tablet; Chew 1 tablet (81 mg total) by mouth daily.  Hypokalemia -     Magnesium  Screening for HIV (human immunodeficiency virus) -     HIV antibody (with reflex)  Viral URI with cough  Essential hypertension  You have a viral URI with cough. For this please do the following:  1. Drink plenty of fluids. Hot tea, soup etc will help open your nasal passages. 2. Corcidin HBP or cough suppression 3. Tylenol for pain up to 1000 mg three times daily.  4. Nasal saline-OTC nose spray for congestion.   F/u for chest pain, shortness of breath, persistent high fever.  Avoid NSAIDs- ibuprofen, advil, aleve, Goody's, BCs.  Avoid OTC decongestants that raise BP  F/u in 2 weeks for pharmacy BP check F/u with me in 6 weeks for HTN   Dr. Adrian Blackwater   DASH Eating Plan DASH stands for "Dietary Approaches to Stop Hypertension." The DASH eating plan is a healthy eating plan that has been shown to reduce high blood pressure (hypertension). Additional health benefits may include reducing the risk of type 2 diabetes mellitus, heart disease, and stroke. The DASH eating plan may also help with weight loss. WHAT DO I NEED TO KNOW ABOUT THE DASH EATING PLAN? For the DASH eating plan,  you will follow these general guidelines:  Choose foods with a percent daily value for sodium of less than 5% (as listed on the food label).  Use salt-free seasonings or herbs instead of table salt or sea salt.  Check with your health care provider or pharmacist before using salt substitutes.  Eat lower-sodium products, often labeled as "lower sodium" or "no salt added."  Eat fresh foods.  Eat more vegetables, fruits, and low-fat dairy products.  Choose whole grains. Look for the word "whole" as the first word in the ingredient list.  Choose fish and skinless chicken or [place] more often than red meat. Limit fish, poultry, and meat to 6 oz (170 g) each day.  Limit sweets, desserts, sugars, and sugary drinks.  Choose heart-healthy fats.  Limit cheese to 1 oz (28 g) per day.  Eat more home-cooked food and less restaurant, buffet, and fast food.  Limit fried foods.  Cook foods using methods other than frying.  Limit canned vegetables. If you do use them, rinse them well to decrease the sodium.  When eating at a restaurant, ask that your food be prepared with less salt, or no salt if possible. WHAT FOODS CAN I EAT? Seek help from a dietitian for individual calorie needs. Grains Whole grain or  whole wheat bread. Brown rice. Whole grain or whole wheat pasta. Quinoa, bulgur, and whole grain cereals. Low-sodium cereals. Corn or whole wheat flour tortillas. Whole grain cornbread. Whole grain crackers. Low-sodium crackers. Vegetables Fresh or frozen vegetables (raw, steamed, roasted, or grilled). Low-sodium or reduced-sodium tomato and vegetable juices. Low-sodium or reduced-sodium tomato sauce and paste. Low-sodium or reduced-sodium canned vegetables.  Fruits All fresh, canned (in natural juice), or frozen fruits. Meat and Other Protein Products Ground beef (85% or leaner), grass-fed beef, or beef trimmed of fat. Skinless chicken or [place]. Ground chicken or [place]. Pork trimmed of  fat. All fish and seafood. Eggs. Dried beans, peas, or lentils. Unsalted nuts and seeds. Unsalted canned beans. Dairy Low-fat dairy products, such as skim or 1% milk, 2% or reduced-fat cheeses, low-fat ricotta or cottage cheese, or plain low-fat yogurt. Low-sodium or reduced-sodium cheeses. Fats and Oils Tub margarines without trans fats. Light or reduced-fat mayonnaise and salad dressings (reduced sodium). Avocado. Safflower, olive, or canola oils. Natural peanut or almond butter. Other Unsalted popcorn and pretzels. The items listed above may not be a complete list of recommended foods or beverages. Contact your dietitian for more options. WHAT FOODS ARE NOT RECOMMENDED? Grains White bread. White pasta. White rice. Refined cornbread. Bagels and croissants. Crackers that contain trans fat. Vegetables Creamed or fried vegetables. Vegetables in a cheese sauce. Regular canned vegetables. Regular canned tomato sauce and paste. Regular tomato and vegetable juices. Fruits Dried fruits. Canned fruit in light or heavy syrup. Fruit juice. Meat and Other Protein Products Fatty cuts of meat. Ribs, chicken wings, bacon, sausage, bologna, salami, chitterlings, fatback, hot dogs, bratwurst, and packaged luncheon meats. Salted nuts and seeds. Canned beans with salt. Dairy Whole or 2% milk, cream, half-and-half, and cream cheese. Whole-fat or sweetened yogurt. Full-fat cheeses or blue cheese. Nondairy creamers and whipped toppings. Processed cheese, cheese spreads, or cheese curds. Condiments Onion and garlic salt, seasoned salt, table salt, and sea salt. Canned and packaged gravies. Worcestershire sauce. Tartar sauce. Barbecue sauce. Teriyaki sauce. Soy sauce, including reduced sodium. Steak sauce. Fish sauce. Oyster sauce. Cocktail sauce. Horseradish. Ketchup and mustard. Meat flavorings and tenderizers. Bouillon cubes. Hot sauce. Tabasco sauce. Marinades. Taco seasonings. Relishes. Fats and Oils Butter,  stick margarine, lard, shortening, ghee, and bacon fat. Coconut, palm kernel, or palm oils. Regular salad dressings. Other Pickles and olives. Salted popcorn and pretzels. The items listed above may not be a complete list of foods and beverages to avoid. Contact your dietitian for more information. WHERE CAN I FIND MORE INFORMATION? National Heart, Lung, and Blood Institute: travelstabloid.com   This information is not intended to replace advice given to you by your health care provider. Make sure you discuss any questions you have with your health care provider.   Document Released: 06/06/2011 Document Revised: 07/08/2014 Document Reviewed: 04/21/2013 Elsevier Interactive Patient Education [employer].

## 2015-06-15 NOTE — Assessment & Plan Note (Signed)
You have a viral URI with cough. For this please do the following:  1. Drink plenty of fluids. Hot tea, soup etc will help open your nasal passages. 2. Corcidin HBP or cough suppression 3. Tylenol for pain up to 1000 mg three times daily.  4. Nasal saline-OTC nose spray for congestion.   F/u for chest pain, shortness of breath, persistent high fever.  Avoid NSAIDs- ibuprofen, advil, aleve, Goody's, BCs.  Avoid OTC decongestants that raise BP

## 2015-06-15 NOTE — Progress Notes (Signed)
F/U HTN no medication x 2 weeks  C/C Cold Sx Body ache, dry cough, runny nose  No pain, pain only with cough No tobacco user  No suicidal thought in the past two weeks

## 2015-06-15 NOTE — Assessment & Plan Note (Addendum)
A: uncontrolled severe HTN with stable LVH, bradycardia on EKG. In setting of  CKD, med non compliance, taking OTC cold medicine for URI.  P: Restart Hydralazine 50 TID Restart norvasc 10 daily Start adlactone 12.5 mg daily to replace HCTZ give persistent hypokalemia  Restart Lisinopril 20 mg daily  Treated in office with 0.1 mg of clonidine

## 2015-06-16 LAB — HIV ANTIBODY (ROUTINE TESTING W REFLEX): HIV 1&2 Ab, 4th Generation: NONREACTIVE

## 2015-06-29 ENCOUNTER — Encounter: Payer: Self-pay | Admitting: Pharmacist

## 2015-07-06 ENCOUNTER — Telehealth: Payer: Self-pay | Admitting: *Deleted

## 2015-07-06 NOTE — Telephone Encounter (Signed)
-----   Message from Boykin Nearing, MD sent at 06/16/2015  9:31 AM EST ----- Screening HIV negie Mag normal  CMP stable with improved Cr to 1.4 and potassium of 3.8 Continue current regimen  Keep close f/u for BP control

## 2015-07-06 NOTE — Telephone Encounter (Signed)
Unable to contact Pt  "Phone number not accepting calls"  Letter Mail to pt

## 2015-08-16 ENCOUNTER — Emergency Department (HOSPITAL_COMMUNITY)
Admission: EM | Admit: 2015-08-16 | Discharge: 2015-08-17 | Disposition: A | Discharge status: Home / Self Care | Payer: MEDICAID | Attending: Emergency Medicine | Admitting: Emergency Medicine

## 2015-08-16 ENCOUNTER — Encounter (HOSPITAL_COMMUNITY): Payer: Self-pay | Admitting: Vascular Surgery

## 2015-08-16 DIAGNOSIS — Z7982 Long term (current) use of aspirin: Secondary | ICD-10-CM | POA: Insufficient documentation

## 2015-08-16 DIAGNOSIS — Z87448 Personal history of other diseases of urinary system: Secondary | ICD-10-CM | POA: Insufficient documentation

## 2015-08-16 DIAGNOSIS — I10 Essential (primary) hypertension: Secondary | ICD-10-CM | POA: Insufficient documentation

## 2015-08-16 DIAGNOSIS — Z8781 Personal history of (healed) traumatic fracture: Secondary | ICD-10-CM | POA: Insufficient documentation

## 2015-08-16 DIAGNOSIS — R011 Cardiac murmur, unspecified: Secondary | ICD-10-CM | POA: Insufficient documentation

## 2015-08-16 DIAGNOSIS — Z8719 Personal history of other diseases of the digestive system: Secondary | ICD-10-CM | POA: Insufficient documentation

## 2015-08-16 DIAGNOSIS — Z79899 Other long term (current) drug therapy: Secondary | ICD-10-CM | POA: Insufficient documentation

## 2015-08-16 LAB — I-STAT CHEM 8, ED
BUN: 22 mg/dL — ABNORMAL HIGH (ref 6–20)
Calcium, Ion: 1.14 mmol/L (ref 1.12–1.23)
Chloride: 102 mmol/L (ref 101–111)
Creatinine, Ser: 1.5 mg/dL — ABNORMAL HIGH (ref 0.61–1.24)
Glucose, Bld: 96 mg/dL (ref 65–99)
HCT: 46 % (ref 39.0–52.0)
Hemoglobin: 15.6 g/dL (ref 13.0–17.0)
Potassium: 3.1 mmol/L — ABNORMAL LOW (ref 3.5–5.1)
Sodium: 143 mmol/L (ref 135–145)
TCO2: 28 mmol/L (ref 0–100)

## 2015-08-16 MED ORDER — LISINOPRIL 20 MG PO TABS
20.0000 mg | ORAL_TABLET | Freq: Every day | ORAL | Status: DC
  Administered 2015-08-16: 20 mg via ORAL
  Filled 2015-08-16: qty 1

## 2015-08-16 MED ORDER — SPIRONOLACTONE 25 MG PO TABS
12.5000 mg | ORAL_TABLET | Freq: Once | ORAL | Status: AC
  Administered 2015-08-16: 12.5 mg via ORAL
  Filled 2015-08-16: qty 0.5

## 2015-08-16 MED ORDER — AMLODIPINE BESYLATE 5 MG PO TABS
10.0000 mg | ORAL_TABLET | Freq: Every day | ORAL | Status: DC
  Administered 2015-08-16: 10 mg via ORAL
  Filled 2015-08-16: qty 2

## 2015-08-16 MED ORDER — HYDRALAZINE HCL 25 MG PO TABS
50.0000 mg | ORAL_TABLET | Freq: Three times a day (TID) | ORAL | Status: DC
Start: 2015-08-16 — End: 2015-08-17
  Administered 2015-08-16: 50 mg via ORAL
  Filled 2015-08-16: qty 2

## 2015-08-16 NOTE — Care Management (Signed)
Patient reports that he has ran out of meds because he did not have the funds to pay for the refill at the time. Patient is being followed by the [facility]. CM sent message to [facility] pharmacy to assist patient with medication refill. Patient instructed to call the [facility] pharmacy tomorrow regarding his refills. CM will follow up with pharmacy tomorrow 2/16.
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
## 2015-08-16 NOTE — ED Notes (Signed)
Pt reports to the ED for eval of HA, dizziness, and lightheadedness x several days. Reports he has been out of his BP medications for over a week because he has not had the money to go see his PCP. He reports he is supposed to be on 4 different BP medications. Pt A&OX4, resp e/u, and skin warm and dry.

## 2015-08-16 NOTE — ED Notes (Signed)
Patient stated he was going to the cafe and will be right back

## 2015-08-17 MED FILL — LISINOPRIL 20 MG TABLET: 20 | 30 days supply | Qty: 30 | Fill #0

## 2015-08-17 MED FILL — ?HYDROCHLOROTHIAZIDE 12.5MG: 12.5 | 30 days supply | Qty: 30 | Fill #0

## 2015-08-17 MED FILL — AMLODIPINE BESYLATE 10 MG T: 10 | 30 days supply | Qty: 30 | Fill #1

## 2015-08-17 NOTE — Discharge Instructions (Signed)
DASH Eating Plan °DASH stands for "Dietary Approaches to Stop Hypertension." The DASH eating plan is a healthy eating plan that has been shown to reduce high blood pressure (hypertension). Additional health benefits may include reducing the risk of type 2 diabetes mellitus, heart disease, and stroke. The DASH eating plan may also help with weight loss. °WHAT DO I NEED TO KNOW ABOUT THE DASH EATING PLAN? °For the DASH eating plan, you will follow these general guidelines: °· Choose foods with a percent daily value for sodium of less than 5% (as listed on the food label). °· Use salt-free seasonings or herbs instead of table salt or sea salt. °· Check with your health care provider or pharmacist before using salt substitutes. °· Eat lower-sodium products, often labeled as "lower sodium" or "no salt added." °· Eat fresh foods. °· Eat more vegetables, fruits, and low-fat dairy products. °· Choose whole grains. Look for the word "whole" as the first word in the ingredient list. °· Choose fish and skinless chicken or turkey more often than red meat. Limit fish, poultry, and meat to 6 oz (170 g) each day. °· Limit sweets, desserts, sugars, and sugary drinks. °· Choose heart-healthy fats. °· Limit cheese to 1 oz (28 g) per day. °· Eat more home-cooked food and less restaurant, buffet, and fast food. °· Limit fried foods. °· Cook foods using methods other than frying. °· Limit canned vegetables. If you do use them, rinse them well to decrease the sodium. °· When eating at a restaurant, ask that your food be prepared with less salt, or no salt if possible. °WHAT FOODS CAN I EAT? °Seek help from a dietitian for individual calorie needs. °Grains °Whole grain or whole wheat bread. Brown rice. Whole grain or whole wheat pasta. Quinoa, bulgur, and whole grain cereals. Low-sodium cereals. Corn or whole wheat flour tortillas. Whole grain cornbread. Whole grain crackers. Low-sodium crackers. °Vegetables °Fresh or frozen vegetables  (raw, steamed, roasted, or grilled). Low-sodium or reduced-sodium tomato and vegetable juices. Low-sodium or reduced-sodium tomato sauce and paste. Low-sodium or reduced-sodium canned vegetables.  °Fruits °All fresh, canned (in natural juice), or frozen fruits. °Meat and Other Protein Products °Ground beef (85% or leaner), grass-fed beef, or beef trimmed of fat. Skinless chicken or turkey. Ground chicken or turkey. Pork trimmed of fat. All fish and seafood. Eggs. Dried beans, peas, or lentils. Unsalted nuts and seeds. Unsalted canned beans. °Dairy °Low-fat dairy products, such as skim or 1% milk, 2% or reduced-fat cheeses, low-fat ricotta or cottage cheese, or plain low-fat yogurt. Low-sodium or reduced-sodium cheeses. °Fats and Oils °Tub margarines without trans fats. Light or reduced-fat mayonnaise and salad dressings (reduced sodium). Avocado. Safflower, olive, or canola oils. Natural peanut or almond butter. °Other °Unsalted popcorn and pretzels. °The items listed above may not be a complete list of recommended foods or beverages. Contact your dietitian for more options. °WHAT FOODS ARE NOT RECOMMENDED? °Grains °White bread. White pasta. White rice. Refined cornbread. Bagels and croissants. Crackers that contain trans fat. °Vegetables °Creamed or fried vegetables. Vegetables in a cheese sauce. Regular canned vegetables. Regular canned tomato sauce and paste. Regular tomato and vegetable juices. °Fruits °Dried fruits. Canned fruit in light or heavy syrup. Fruit juice. °Meat and Other Protein Products °Fatty cuts of meat. Ribs, chicken wings, bacon, sausage, bologna, salami, chitterlings, fatback, hot dogs, bratwurst, and packaged luncheon meats. Salted nuts and seeds. Canned beans with salt. °Dairy °Whole or 2% milk, cream, half-and-half, and cream cheese. Whole-fat or sweetened yogurt. Full-fat   cheeses or blue cheese. Nondairy creamers and whipped toppings. Processed cheese, cheese spreads, or cheese  curds. °Condiments °Onion and garlic salt, seasoned salt, table salt, and sea salt. Canned and packaged gravies. Worcestershire sauce. Tartar sauce. Barbecue sauce. Teriyaki sauce. Soy sauce, including reduced sodium. Steak sauce. Fish sauce. Oyster sauce. Cocktail sauce. Horseradish. Ketchup and mustard. Meat flavorings and tenderizers. Bouillon cubes. Hot sauce. Tabasco sauce. Marinades. Taco seasonings. Relishes. °Fats and Oils °Butter, stick margarine, lard, shortening, ghee, and bacon fat. Coconut, palm kernel, or palm oils. Regular salad dressings. °Other °Pickles and olives. Salted popcorn and pretzels. °The items listed above may not be a complete list of foods and beverages to avoid. Contact your dietitian for more information. °WHERE CAN I FIND MORE INFORMATION? °National [employer], [employer], [employer]: www.nhlbi.nih.gov/health/health-topics/topics/dash/ °  °This information is not intended to replace advice given to you by your health care provider. Make sure you discuss any questions you have with your health care provider. °  °Document Released: 06/06/2011 Document Revised: 07/08/2014 Document Reviewed: 04/21/2013 °Elsevier Interactive Patient Education ©2016 Else[employer] Inc[employer] ° °Hypertension °Hypertension, commonly called high blood pressure, is when the force of blood pumping through your arteries is too strong. Your arteries are the blood vessels that carry blood from your heart throughout your body. A blood pressure reading consists of a higher number over a lower number, such as 110/72. The higher number (systolic) is the pressure inside your arteries when your heart pumps. The lower number (diastolic) is the pressure inside your arteries when your heart relaxes. Ideally you want your blood pressure below 120/80. °Hypertension forces your heart to work harder to pump blood. Your arteries may become narrow or stiff. Having untreated or uncontrolled hypertension can cause heart attack, stroke, kidney  disease, and other problems. °RISK FACTORS °Some risk factors for high blood pressure are controllable. Others are not.  °Risk factors you cannot control include:  °· Race. You may be at higher risk if you are African American. °· Age. Risk increases with age. °· Gender. Men are at higher risk than women before age 45 years. After age 65, women are at higher risk than men. °Risk factors you can control include: °· Not getting enough exercise or physical activity. °· Being overweight. °· Getting too much fat, sugar, calories, or salt in your diet. °· Drinking too much alcohol. °SIGNS AND SYMPTOMS °Hypertension does not usually cause signs or symptoms. Extremely high blood pressure (hypertensive crisis) may cause headache, anxiety, shortness of breath, and nosebleed. °DIAGNOSIS °To check if you have hypertension, your health care provider will measure your blood pressure while you are seated, with your arm held at the level of your heart. It should be measured at least twice using the same arm. Certain conditions can cause a difference in blood pressure between your right and left arms. A blood pressure reading that is higher than normal on one occasion does not mean that you need treatment. If it is not clear whether you have high blood pressure, you may be asked to return on a different day to have your blood pressure checked again. Or, you may be asked to monitor your blood pressure at home for 1 or more weeks. °TREATMENT °Treating high blood pressure includes making lifestyle changes and possibly taking medicine. Living a healthy lifestyle can help lower high blood pressure. You may need to change some of your habits. °Lifestyle changes may include: °· Following the DASH diet. This diet is high in fruits, vegetables, and whole   grains. It is low in salt, red meat, and added sugars. °· Keep your sodium intake below 2,300 mg per day. °· Getting at least 30-45 minutes of aerobic exercise at least 4 times per  week. °· Losing weight if necessary. °· Not smoking. °· Limiting alcoholic beverages. °· Learning ways to reduce stress. °Your health care provider may prescribe medicine if lifestyle changes are not enough to get your blood pressure under control, and if one of the following is true: °· You are 18-59 years of age and your systolic blood pressure is above 140. °· You are 60 years of age or older, and your systolic blood pressure is above 150. °· Your diastolic blood pressure is above 90. °· You have diabetes, and your systolic blood pressure is over 140 or your diastolic blood pressure is over 90. °· You have kidney disease and your blood pressure is above 140/90. °· You have heart disease and your blood pressure is above 140/90. °Your personal target blood pressure may vary depending on your medical conditions, your age, and other factors. °HOME CARE INSTRUCTIONS °· Have your blood pressure rechecked as directed by your health care provider.   °· Take medicines only as directed by your health care provider. Follow the directions carefully. Blood pressure medicines must be taken as prescribed. The medicine does not work as well when you skip doses. Skipping doses also puts you at risk for problems. °· Do not smoke.   °· Monitor your blood pressure at home as directed by your health care provider.  °SEEK MEDICAL CARE IF:  °· You think you are having a reaction to medicines taken. °· You have recurrent headaches or feel dizzy. °· You have swelling in your ankles. °· You have trouble with your vision. °SEEK IMMEDIATE MEDICAL CARE IF: °· You develop a severe headache or confusion. °· You have unusual weakness, numbness, or feel faint. °· You have severe chest or abdominal pain. °· You vomit repeatedly. °· You have trouble breathing. °MAKE SURE YOU:  °· Understand these instructions. °· Will watch your condition. °· Will get help right away if you are not doing well or get worse. °  °This information is not intended to  replace advice given to you by your health care provider. Make sure you discuss any questions you have with your health care provider. °  °Document Released: 06/17/2005 Document Revised: 11/01/2014 Document Reviewed: 04/09/2013 °Elsevier Interactive Patient Education ©2016 Elsevier Inc. ° °

## 2015-08-24 ENCOUNTER — Inpatient Hospital Stay: Payer: Self-pay

## 2015-09-29 MED FILL — ?LISINOPRIL 20 MG TABLET: 20 | 30 days supply | Qty: 30 | Fill #1

## 2015-09-29 MED FILL — ?SPIRONOLACTONE 25 MG TABLE: 25 | 30 days supply | Qty: 15 | Fill #1

## 2015-09-29 MED FILL — ?AMLODIPINE BESYLATE 10 MG: 10 | 30 days supply | Qty: 30 | Fill #2

## 2015-09-29 MED FILL — ?HYDROCHLOROTHIAZIDE 12.5MG: 12.5 | 30 days supply | Qty: 30 | Fill #1

## 2015-09-29 MED FILL — hydrALAZINE HCL 50 MG TABS: 50 | 30 days supply | Qty: 90 | Fill #1

## 2015-10-05 ENCOUNTER — Encounter: Payer: Self-pay | Admitting: Family Medicine

## 2015-10-05 ENCOUNTER — Ambulatory Visit: Payer: Self-pay | Attending: Family Medicine | Admitting: Family Medicine

## 2015-10-05 VITALS — BP 129/68 | HR 59 | Temp 98.0°F | Resp 16 | Ht 65.0 in | Wt 169.0 lb

## 2015-10-05 DIAGNOSIS — Z79899 Other long term (current) drug therapy: Secondary | ICD-10-CM | POA: Insufficient documentation

## 2015-10-05 DIAGNOSIS — M6281 Muscle weakness (generalized): Secondary | ICD-10-CM | POA: Insufficient documentation

## 2015-10-05 DIAGNOSIS — E876 Hypokalemia: Secondary | ICD-10-CM | POA: Insufficient documentation

## 2015-10-05 DIAGNOSIS — S46311A Strain of muscle, fascia and tendon of triceps, right arm, initial encounter: Secondary | ICD-10-CM | POA: Insufficient documentation

## 2015-10-05 DIAGNOSIS — Z7982 Long term (current) use of aspirin: Secondary | ICD-10-CM | POA: Insufficient documentation

## 2015-10-05 DIAGNOSIS — I10 Essential (primary) hypertension: Secondary | ICD-10-CM | POA: Insufficient documentation

## 2015-10-05 LAB — BASIC METABOLIC PANEL
BUN: 27 mg/dL — ABNORMAL HIGH (ref 7–25)
CO2: 25 mmol/L (ref 20–31)
Calcium: 10.1 mg/dL (ref 8.6–10.3)
Chloride: 101 mmol/L (ref 98–110)
Creat: 1.56 mg/dL — ABNORMAL HIGH (ref 0.70–1.33)
Glucose, Bld: 103 mg/dL — ABNORMAL HIGH (ref 65–99)
Potassium: 4.2 mmol/L (ref 3.5–5.3)
Sodium: 139 mmol/L (ref 135–146)

## 2015-10-05 MED ORDER — DICLOFENAC SODIUM 1 % TD GEL: 2.0000 g | Freq: Four times a day (QID) | TRANSDERMAL | Status: DC

## 2015-10-05 MED ORDER — ACETAMINOPHEN ER 650 MG PO TBCR: 650.0000 mg | EXTENDED_RELEASE_TABLET | Freq: Three times a day (TID) | ORAL | Status: DC | PRN

## 2015-10-05 NOTE — Progress Notes (Signed)
Subjective:  Patient ID: Corey Guerrero, male    DOB: 05-22-1956  Age: 60 y.o. MRN: [id number]  CC: Arm Pain   HPI Corey Guerrero presents for   1. R triceps pain: presents for 6 months. Reported weightlifting injury. Denies weakness in right hand. Also having pain right shoulder. He has not had imaging for injury. He is uninsured. He has not been seen by orthopedic present.  2. CHRONIC HYPERTENSION  Disease Monitoring  Blood pressure range: not checking   Chest pain: no   Dyspnea: no   Claudication: no   Medication compliance: yes  Medication Side Effects  Lightheadedness: no   Urinary frequency: yes   Edema: no     Social History  Substance Use Topics  . Smoking status: Never Smoker   . Smokeless tobacco: Never Used  . 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 amLODipine (NORVASC) 10 MG tablet Take 1 tablet (10 mg total) by mouth daily. 30 tablet 5  . aspirin 81 MG chewable tablet Chew 1 tablet (81 mg total) by mouth daily. 30 tablet 5  . Cyanocobalamin (VITAMIN B 12 PO) Take 1 tablet by mouth daily. Reported on 06/15/2015    . hydrALAZINE (APRESOLINE) 50 MG tablet Take 1 tablet (50 mg total) by mouth 3 (three) times daily. 90 tablet 3  . lisinopril (PRINIVIL,ZESTRIL) 20 MG tablet Take 1 tablet (20 mg total) by mouth daily. 30 tablet 5  . Multiple Vitamins-Minerals (MULTIVITAMIN WITH MINERALS) tablet Take 1 tablet by mouth daily. Reported on 06/15/2015    . omega-3 acid ethyl esters (LOVAZA) 1 G capsule Take 1 g by mouth 2 (two) times daily. Reported on 06/15/2015    . spironolactone (ALDACTONE) 25 MG tablet Take 0.5 tablets (12.5 mg total) by mouth daily. 30 tablet 5   No facility-administered medications prior to visit.    ROS Review of Systems  Constitutional: Negative for fever, chills, fatigue and unexpected weight change.  Eyes: Negative for visual  disturbance.  Respiratory: Negative for cough and shortness of breath.   Cardiovascular: Negative for chest pain, palpitations and leg swelling.  Gastrointestinal: Negative for nausea, vomiting, abdominal pain, diarrhea, constipation and blood in stool.  Endocrine: Negative for polydipsia, polyphagia and polyuria.  Musculoskeletal: Positive for myalgias and arthralgias. Negative for back pain, gait problem and neck pain.  Skin: Negative for rash.  Allergic/Immunologic: Negative for immunocompromised state.  Hematological: Negative for adenopathy. Does not bruise/bleed easily.  Psychiatric/Behavioral: Negative for suicidal ideas, sleep disturbance and dysphoric mood. The patient is not nervous/anxious.     Objective:  BP 129/68 mmHg  Pulse 59  Temp(Src) 98 F (36.7 C) (Oral)  Resp 16  Ht 5\' 5"  (1.651 m)  Wt 169 lb (76.658 kg)  BMI 28.12 kg/m2  SpO2 97%  BP/Weight 10/05/2015 08/17/2015 [id number]  Systolic BP [id number]  Diastolic BP 68 92 [id number]  Wt. (Lbs) 169 - 175  BMI 28.12 - 28.26   Physical Exam  Constitutional: He appears well-developed and well-nourished. No distress.  HENT:  Head: Normocephalic and atraumatic.  Neck: Normal range of motion. Neck supple.  Cardiovascular: Normal rate, regular rhythm, normal heart sounds and intact distal pulses.   Pulmonary/Chest: Effort normal and breath sounds normal.  Musculoskeletal: He exhibits no edema.       Right upper arm: He exhibits tenderness and deformity. He exhibits  no bony tenderness, no swelling, no edema and no laceration.       Arms: Neurological: He is alert.  Skin: Skin is warm and dry. No rash noted. No erythema.  Psychiatric: He has a normal mood and affect.     Assessment & Plan:   There are no diagnoses linked to this encounter. Corey Guerrero was seen today for arm pain.  Diagnoses and all orders for this visit:  Muscle right arm weakness -     diclofenac sodium (VOLTAREN) 1 % GEL; Apply 2 g topically 4 (four)  times daily. -     acetaminophen (TYLENOL 8 HOUR) 650 MG CR tablet; Take 1 tablet (650 mg total) by mouth every 8 (eight) hours as needed for pain. -     [place] Extrem Up Right Comp; Future  Essential hypertension -     Basic Metabolic Panel  Hypokalemia -     Basic Metabolic Panel   Meds ordered this encounter  Medications  . diclofenac sodium (VOLTAREN) 1 % GEL    Sig: Apply 2 g topically 4 (four) times daily.    Dispense:  100 g    Refill:  3  . acetaminophen (TYLENOL 8 HOUR) 650 MG CR tablet    Sig: Take 1 tablet (650 mg total) by mouth every 8 (eight) hours as needed for pain.    Dispense:  30 tablet    Refill:  2    Follow-up: No Follow-up on file.   Boykin Nearing MD

## 2015-10-05 NOTE — Assessment & Plan Note (Signed)
A: R tricep pain and weakness following weight lifting injury. Suspect tricep tendon rupture P: [place]  Ortho referral pending insurance or [place] discount voltaren and tylenol for pain control

## 2015-10-05 NOTE — Progress Notes (Signed)
C/C Rt arm pain due to injury 2- 3 month ago  Requesting MRI appointment  Pain scale #5  No tobacco user  No suicidal thoughts in the past two weeks

## 2015-10-05 NOTE — Patient Instructions (Addendum)
Corey Guerrero was seen today for arm pain.  Diagnoses and all orders for this visit:  Muscle right arm weakness -     diclofenac sodium (VOLTAREN) 1 % GEL; Apply 2 g topically 4 (four) times daily. -     acetaminophen (TYLENOL 8 HOUR) 650 MG CR tablet; Take 1 tablet (650 mg total) by mouth every 8 (eight) hours as needed for pain. -     [place] Extrem Up Right Comp; Future  Essential hypertension -     Basic Metabolic Panel  Hypokalemia -     Basic Metabolic Panel   I need to refer you to GI for colonoscopy and to ortho for your R arm pain with likely tricep tendon tear.   Please apply for [place] discount and orange card, medicaid, look into private insurance as well.   Yyou can also inquire if any of your medications are on the PASS (medications assistance) list.   Dr. Adrian Blackwater

## 2015-10-05 NOTE — Assessment & Plan Note (Signed)
A: well controlled Med: compliant P: Continue current regimen BMP

## 2015-10-20 ENCOUNTER — Emergency Department (HOSPITAL_COMMUNITY): Payer: MEDICAID

## 2015-10-20 ENCOUNTER — Encounter (HOSPITAL_COMMUNITY): Payer: Self-pay | Admitting: Emergency Medicine

## 2015-10-20 ENCOUNTER — Emergency Department (HOSPITAL_COMMUNITY)
Admission: EM | Admit: 2015-10-20 | Discharge: 2015-10-20 | Disposition: A | Discharge status: Home / Self Care | Payer: MEDICAID | Attending: Emergency Medicine | Admitting: Emergency Medicine

## 2015-10-20 DIAGNOSIS — Y9289 Other specified places as the place of occurrence of the external cause: Secondary | ICD-10-CM | POA: Insufficient documentation

## 2015-10-20 DIAGNOSIS — Y9389 Activity, other specified: Secondary | ICD-10-CM | POA: Insufficient documentation

## 2015-10-20 DIAGNOSIS — X58XXXA Exposure to other specified factors, initial encounter: Secondary | ICD-10-CM | POA: Insufficient documentation

## 2015-10-20 DIAGNOSIS — Z87448 Personal history of other diseases of urinary system: Secondary | ICD-10-CM | POA: Insufficient documentation

## 2015-10-20 DIAGNOSIS — M25572 Pain in left ankle and joints of left foot: Secondary | ICD-10-CM

## 2015-10-20 DIAGNOSIS — Z7982 Long term (current) use of aspirin: Secondary | ICD-10-CM | POA: Insufficient documentation

## 2015-10-20 DIAGNOSIS — R52 Pain, unspecified: Secondary | ICD-10-CM

## 2015-10-20 DIAGNOSIS — Z79899 Other long term (current) drug therapy: Secondary | ICD-10-CM | POA: Insufficient documentation

## 2015-10-20 DIAGNOSIS — Z791 Long term (current) use of non-steroidal anti-inflammatories (NSAID): Secondary | ICD-10-CM | POA: Insufficient documentation

## 2015-10-20 DIAGNOSIS — Y998 Other external cause status: Secondary | ICD-10-CM | POA: Insufficient documentation

## 2015-10-20 DIAGNOSIS — I10 Essential (primary) hypertension: Secondary | ICD-10-CM | POA: Insufficient documentation

## 2015-10-20 DIAGNOSIS — S93402A Sprain of unspecified ligament of left ankle, initial encounter: Secondary | ICD-10-CM

## 2015-10-20 DIAGNOSIS — Z8719 Personal history of other diseases of the digestive system: Secondary | ICD-10-CM | POA: Insufficient documentation

## 2015-10-20 DIAGNOSIS — Z8781 Personal history of (healed) traumatic fracture: Secondary | ICD-10-CM | POA: Insufficient documentation

## 2015-10-20 NOTE — ED Notes (Signed)
Pt ambulatory and independent at discharge.  
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
## 2015-10-20 NOTE — Discharge Instructions (Signed)
Please read and follow all provided instructions.  Your diagnoses today include:  1. Ankle pain, left   2. Pain   3. Ankle sprain, left, initial encounter    Tests performed today include:  Vital signs. See below for your results today.   Medications prescribed:   None  Home care instructions:  Follow any educational materials contained in this packet.  *PRICE in the first 24-48 hours after injury: Protect (with brace, splint, sling), if given by your provider Rest Ice- Do not apply ice pack directly to your skin, place towel or similar between your skin and ice/ice pack. Apply ice for 20 min, then remove for 40 min while awake Compression- Wear brace, elastic bandage, splint as directed by your provider Elevate affected extremity above the level of your heart when not walking around for the first 24-48 hours   Follow-up instructions: Please follow-up with your primary care provider in the next week for further evaluation of symptoms and treatment. I have provided number for Orthopedics if pain does not resolve on it's own.  Return instructions:   Please return to the Emergency Department if you do not get better, if you get worse, or new symptoms OR  - Fever (temperature greater than 101.77F)  - Bleeding that does not stop with holding pressure to the area    -Severe pain (please note that you may be more sore the day after your accident)  - Chest Pain  - Difficulty breathing  - Severe nausea or vomiting  - Inability to tolerate food and liquids  - Passing out  - Skin becoming red around your wounds  - Change in mental status (confusion or lethargy)  - New numbness or weakness     Please return if you have any other emergent concerns.  Additional Information:  Your vital signs today were: BP 116/65 mmHg   Pulse 61   Temp(Src) 98.2 F (36.8 C) (Oral)   Resp 16   SpO2 99% If your blood pressure (BP) was elevated above 135/85 this visit, please have this repeated by  your doctor within one month. ---------------

## 2015-10-28 ENCOUNTER — Emergency Department (HOSPITAL_COMMUNITY): Payer: Self-pay

## 2015-10-28 ENCOUNTER — Encounter (HOSPITAL_COMMUNITY): Payer: Self-pay | Admitting: Emergency Medicine

## 2015-10-28 ENCOUNTER — Emergency Department (HOSPITAL_COMMUNITY)
Admission: EM | Admit: 2015-10-28 | Discharge: 2015-10-28 | Disposition: A | Discharge status: Home / Self Care | Payer: Self-pay | Attending: Emergency Medicine | Admitting: Emergency Medicine

## 2015-10-28 DIAGNOSIS — I129 Hypertensive chronic kidney disease with stage 1 through stage 4 chronic kidney disease, or unspecified chronic kidney disease: Secondary | ICD-10-CM | POA: Insufficient documentation

## 2015-10-28 DIAGNOSIS — Z79899 Other long term (current) drug therapy: Secondary | ICD-10-CM | POA: Insufficient documentation

## 2015-10-28 DIAGNOSIS — R109 Unspecified abdominal pain: Secondary | ICD-10-CM | POA: Insufficient documentation

## 2015-10-28 DIAGNOSIS — X58XXXD Exposure to other specified factors, subsequent encounter: Secondary | ICD-10-CM | POA: Insufficient documentation

## 2015-10-28 DIAGNOSIS — Z8781 Personal history of (healed) traumatic fracture: Secondary | ICD-10-CM | POA: Insufficient documentation

## 2015-10-28 DIAGNOSIS — R110 Nausea: Secondary | ICD-10-CM | POA: Insufficient documentation

## 2015-10-28 DIAGNOSIS — R197 Diarrhea, unspecified: Secondary | ICD-10-CM | POA: Insufficient documentation

## 2015-10-28 DIAGNOSIS — Z7982 Long term (current) use of aspirin: Secondary | ICD-10-CM | POA: Insufficient documentation

## 2015-10-28 DIAGNOSIS — Z8719 Personal history of other diseases of the digestive system: Secondary | ICD-10-CM | POA: Insufficient documentation

## 2015-10-28 DIAGNOSIS — S99912D Unspecified injury of left ankle, subsequent encounter: Secondary | ICD-10-CM | POA: Insufficient documentation

## 2015-10-28 DIAGNOSIS — N189 Chronic kidney disease, unspecified: Secondary | ICD-10-CM | POA: Insufficient documentation

## 2015-10-28 DIAGNOSIS — K529 Noninfective gastroenteritis and colitis, unspecified: Secondary | ICD-10-CM

## 2015-10-28 LAB — CBC WITH DIFFERENTIAL/PLATELET
Basophils Absolute: 0 10*3/uL (ref 0.0–0.1)
Basophils Relative: 0 %
Eosinophils Absolute: 0.1 10*3/uL (ref 0.0–0.7)
Eosinophils Relative: 1 %
HCT: 41.3 % (ref 39.0–52.0)
Hemoglobin: 13.9 g/dL (ref 13.0–17.0)
Lymphocytes Relative: 28 %
Lymphs Abs: 1.3 10*3/uL (ref 0.7–4.0)
MCH: 31.5 pg (ref 26.0–34.0)
MCHC: 33.7 g/dL (ref 30.0–36.0)
MCV: 93.7 fL (ref 78.0–100.0)
Monocytes Absolute: 0.4 10*3/uL (ref 0.1–1.0)
Monocytes Relative: 8 %
Neutro Abs: 2.9 10*3/uL (ref 1.7–7.7)
Neutrophils Relative %: 63 %
Platelets: 376 10*3/uL (ref 150–400)
RBC: 4.41 MIL/uL (ref 4.22–5.81)
RDW: 12.3 % (ref 11.5–15.5)
WBC: 4.5 10*3/uL (ref 4.0–10.5)

## 2015-10-28 LAB — URINE MICROSCOPIC-ADD ON
Bacteria, UA: NONE SEEN
RBC / HPF: NONE SEEN RBC/hpf (ref 0–5)
Squamous Epithelial / LPF: NONE SEEN
WBC, UA: NONE SEEN WBC/hpf (ref 0–5)

## 2015-10-28 LAB — COMPREHENSIVE METABOLIC PANEL
ALT: 86 U/L — ABNORMAL HIGH (ref 17–63)
AST: 57 U/L — ABNORMAL HIGH (ref 15–41)
Albumin: 3.7 g/dL (ref 3.5–5.0)
Alkaline Phosphatase: 44 U/L (ref 38–126)
Anion gap: 10 (ref 5–15)
BUN: 17 mg/dL (ref 6–20)
CO2: 25 mmol/L (ref 22–32)
Calcium: 9 mg/dL (ref 8.9–10.3)
Chloride: 105 mmol/L (ref 101–111)
Creatinine, Ser: 1.45 mg/dL — ABNORMAL HIGH (ref 0.61–1.24)
GFR calc Af Amer: 60 mL/min — ABNORMAL LOW (ref >60)
GFR calc non Af Amer: 52 mL/min — ABNORMAL LOW (ref >60)
Glucose, Bld: 98 mg/dL (ref 65–99)
Potassium: 3.5 mmol/L (ref 3.5–5.1)
Sodium: 140 mmol/L (ref 135–145)
Total Bilirubin: 0.6 mg/dL (ref 0.3–1.2)
Total Protein: 7.1 g/dL (ref 6.5–8.1)

## 2015-10-28 LAB — URINALYSIS, ROUTINE W REFLEX MICROSCOPIC
Bilirubin Urine: NEGATIVE
Glucose, UA: NEGATIVE mg/dL
Hgb urine dipstick: NEGATIVE
Ketones, ur: NEGATIVE mg/dL
Leukocytes, UA: NEGATIVE
Nitrite: NEGATIVE
Protein, ur: 30 mg/dL — AB
Specific Gravity, Urine: 1.022 (ref 1.005–1.030)
pH: 6.5 (ref 5.0–8.0)

## 2015-10-28 LAB — RAPID URINE DRUG SCREEN, HOSP PERFORMED
Amphetamines: NOT DETECTED
Barbiturates: NOT DETECTED
Benzodiazepines: NOT DETECTED
Cocaine: NOT DETECTED
Opiates: NOT DETECTED
Tetrahydrocannabinol: NOT DETECTED

## 2015-10-28 LAB — LIPASE, BLOOD: Lipase: 22 U/L (ref 11–51)

## 2015-10-28 MED ORDER — SODIUM CHLORIDE 0.9 % IV BOLUS (SEPSIS)
1000.0000 mL | Freq: Once | INTRAVENOUS | Status: AC
  Administered 2015-10-28: 1000 mL via INTRAVENOUS

## 2015-10-28 MED ORDER — AMLODIPINE BESYLATE 5 MG PO TABS
10.0000 mg | ORAL_TABLET | Freq: Every day | ORAL | Status: DC
  Administered 2015-10-28: 10 mg via ORAL
  Filled 2015-10-28: qty 2

## 2015-10-28 MED ORDER — ONDANSETRON HCL 4 MG/2ML IJ SOLN
4.0000 mg | Freq: Once | INTRAMUSCULAR | Status: AC
  Administered 2015-10-28: 4 mg via INTRAVENOUS
  Filled 2015-10-28: qty 2

## 2015-10-28 MED ORDER — LISINOPRIL 20 MG PO TABS
20.0000 mg | ORAL_TABLET | Freq: Once | ORAL | Status: AC
  Administered 2015-10-28: 20 mg via ORAL
  Filled 2015-10-28: qty 1

## 2015-10-28 MED ORDER — HYDRALAZINE HCL 25 MG PO TABS
50.0000 mg | ORAL_TABLET | Freq: Once | ORAL | Status: AC
  Administered 2015-10-28: 50 mg via ORAL
  Filled 2015-10-28: qty 2

## 2015-10-28 MED ORDER — ONDANSETRON 4 MG PO TBDP: 4.0000 mg | ORAL_TABLET | Freq: Three times a day (TID) | ORAL | Status: DC | PRN

## 2015-10-28 MED ORDER — IOPAMIDOL (ISOVUE-300) INJECTION 61%
INTRAVENOUS | Status: AC
  Administered 2015-10-28: 75 mL
  Filled 2015-10-28: qty 75

## 2015-10-28 MED ORDER — LISINOPRIL 20 MG PO TABS
20.0000 mg | ORAL_TABLET | Freq: Every day | ORAL | Status: DC
Start: 2015-10-28 — End: 2015-10-28

## 2015-10-28 MED ORDER — HYDRALAZINE HCL 25 MG PO TABS: 50.0000 mg | ORAL_TABLET | Freq: Three times a day (TID) | ORAL | Status: DC

## 2015-10-28 NOTE — Discharge Instructions (Signed)
For your nausea and abdominal symptoms you may take zofran (ondansetron) by mouth as needed otherwise they should continue to improve with time for the next 1-2 days. Your diarrhea can rarely be contagious and I just recommend good hand hygiene. For your ankle pain [facility]taren gel and tylenol 650mg  every 6 hours as needed. You should also try to arrange an appointment with your PCP in about a week to check for resolution of these symptoms.
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
## 2015-10-28 NOTE — ED Notes (Signed)
Patient transported to X-ray 

## 2015-10-28 NOTE — ED Notes (Signed)
Reports diarrhea several times since 2200 last night. No vomiting, reports dry heaves, but no vomiting. Left ankle hurting for a week. Able to walk on it.

## 2015-12-19 ENCOUNTER — Encounter (HOSPITAL_COMMUNITY): Payer: Self-pay | Admitting: Emergency Medicine

## 2015-12-19 ENCOUNTER — Emergency Department (HOSPITAL_COMMUNITY)
Admission: EM | Admit: 2015-12-19 | Discharge: 2015-12-19 | Disposition: A | Discharge status: Home / Self Care | Payer: MEDICAID | Attending: Emergency Medicine | Admitting: Emergency Medicine

## 2015-12-19 ENCOUNTER — Other Ambulatory Visit: Payer: Self-pay

## 2015-12-19 DIAGNOSIS — I10 Essential (primary) hypertension: Secondary | ICD-10-CM | POA: Insufficient documentation

## 2015-12-19 DIAGNOSIS — M545 Low back pain: Secondary | ICD-10-CM | POA: Insufficient documentation

## 2015-12-19 DIAGNOSIS — R252 Cramp and spasm: Secondary | ICD-10-CM

## 2015-12-19 DIAGNOSIS — M79669 Pain in unspecified lower leg: Secondary | ICD-10-CM | POA: Insufficient documentation

## 2015-12-19 LAB — URINALYSIS, ROUTINE W REFLEX MICROSCOPIC
Bilirubin Urine: NEGATIVE
Glucose, UA: NEGATIVE mg/dL
Hgb urine dipstick: NEGATIVE
Ketones, ur: NEGATIVE mg/dL
Leukocytes, UA: NEGATIVE
Nitrite: NEGATIVE
Protein, ur: 30 mg/dL — AB
Specific Gravity, Urine: 1.015 (ref 1.005–1.030)
pH: 7 (ref 5.0–8.0)

## 2015-12-19 LAB — CBC WITH DIFFERENTIAL/PLATELET
Basophils Absolute: 0 10*3/uL (ref 0.0–0.1)
Basophils Relative: 0 %
Eosinophils Absolute: 0.1 10*3/uL (ref 0.0–0.7)
Eosinophils Relative: 1 %
HCT: 44.4 % (ref 39.0–52.0)
Hemoglobin: 14.6 g/dL (ref 13.0–17.0)
Lymphocytes Relative: 30 %
Lymphs Abs: 1.7 10*3/uL (ref 0.7–4.0)
MCH: 31.3 pg (ref 26.0–34.0)
MCHC: 32.9 g/dL (ref 30.0–36.0)
MCV: 95.1 fL (ref 78.0–100.0)
Monocytes Absolute: 0.4 10*3/uL (ref 0.1–1.0)
Monocytes Relative: 7 %
Neutro Abs: 3.5 10*3/uL (ref 1.7–7.7)
Neutrophils Relative %: 62 %
Platelets: 282 10*3/uL (ref 150–400)
RBC: 4.67 MIL/uL (ref 4.22–5.81)
RDW: 13.2 % (ref 11.5–15.5)
WBC: 5.7 10*3/uL (ref 4.0–10.5)

## 2015-12-19 LAB — BASIC METABOLIC PANEL
Anion gap: 6 (ref 5–15)
BUN: 26 mg/dL — ABNORMAL HIGH (ref 6–20)
CO2: 29 mmol/L (ref 22–32)
Calcium: 9.2 mg/dL (ref 8.9–10.3)
Chloride: 103 mmol/L (ref 101–111)
Creatinine, Ser: 1.91 mg/dL — ABNORMAL HIGH (ref 0.61–1.24)
GFR calc Af Amer: 43 mL/min — ABNORMAL LOW (ref >60)
GFR calc non Af Amer: 37 mL/min — ABNORMAL LOW (ref >60)
Glucose, Bld: 86 mg/dL (ref 65–99)
Potassium: 3.5 mmol/L (ref 3.5–5.1)
Sodium: 138 mmol/L (ref 135–145)

## 2015-12-19 LAB — URINE MICROSCOPIC-ADD ON

## 2015-12-19 LAB — I-STAT TROPONIN, ED: Troponin i, poc: 0.03 ng/mL (ref 0.00–0.08)

## 2015-12-19 MED ORDER — SODIUM CHLORIDE 0.9 % IV BOLUS (SEPSIS)
1000.0000 mL | Freq: Once | INTRAVENOUS | Status: AC
  Administered 2015-12-19: 1000 mL via INTRAVENOUS

## 2015-12-19 MED ORDER — AMLODIPINE BESYLATE 5 MG PO TABS
10.0000 mg | ORAL_TABLET | Freq: Once | ORAL | Status: AC
  Administered 2015-12-19: 10 mg via ORAL
  Filled 2015-12-19: qty 2

## 2015-12-19 MED ORDER — HYDRALAZINE HCL 25 MG PO TABS
50.0000 mg | ORAL_TABLET | Freq: Once | ORAL | Status: AC
  Administered 2015-12-19: 50 mg via ORAL
  Filled 2015-12-19: qty 2

## 2015-12-19 MED ORDER — ACETAMINOPHEN 325 MG PO TABS
650.0000 mg | ORAL_TABLET | Freq: Once | ORAL | Status: AC
  Administered 2015-12-19: 650 mg via ORAL
  Filled 2015-12-19: qty 2

## 2015-12-19 MED ORDER — LISINOPRIL 20 MG PO TABS
20.0000 mg | ORAL_TABLET | Freq: Once | ORAL | Status: AC
  Administered 2015-12-19: 20 mg via ORAL
  Filled 2015-12-19: qty 1

## 2015-12-19 MED FILL — ?LISINOPRIL 20 MG TABLET: 20 | 30 days supply | Qty: 30 | Fill #2

## 2015-12-19 MED FILL — VOLTAREN 1% GEL: 1 | 27 days supply | Qty: 100 | Fill #0

## 2015-12-19 MED FILL — AMLODIPINE BESYLATE 10 MG T: 10 | 30 days supply | Qty: 30 | Fill #3

## 2015-12-19 MED FILL — ?HYDROCHLOROTHIAZIDE 12.5MG: 12.5 | 30 days supply | Qty: 30 | Fill #2

## 2015-12-19 NOTE — Progress Notes (Signed)
Spoke to patient regarding primary care resources and the [facility] orange card. Orange card application provided and explained, pt instructed to contact me once application is complete for an eligibility appointment to obtain the orange card and to be set up with a pcp. Resource guide and my contact information provided for any future questions or concerns. No other [place] [facility]ist needs identified at this time.   [id number] [phone]

## 2015-12-19 NOTE — Discharge Instructions (Signed)
Read the information below.   You were given fluids in the ED and tylenol for pain relief.  Your EKG showed some enlargement of your heart. This could be related to untreated high blood pressure. Your labs showed some mild dysfunction of your kidney's - this could be related to dehydration or untreated high blood pressure. It is very important that you take your blood pressure medication as prescribed to reduce the risk of stroke and further damage to your heart and kidneys.  You were given a dose of your home blood pressure medication in the ED with improvement.  Use the prescribed medication as directed.  Please discuss all new medications with your pharmacist.  You stated your blood pressure medications have been refilled and are ready to pick up. Please be sure to pick up the medication.  It is important that you follow up with your PCP within in the next week for re-evaluation.  You may return to the Emergency Department at any time for worsening condition or any new symptoms that concern you. Return to the ED if you develop chest pain, shortness of breath, new neurologic symptoms such as numbness, weakness, facial droop, or slurred speech, or if you develop bowel or bladder incontinence.    Hypertension Hypertension is another name for high blood pressure. High blood pressure forces your heart to work harder to pump blood. A blood pressure reading has two numbers, which includes a higher number over a lower number (example: 110/72). HOME CARE   Have your blood pressure rechecked by your doctor.  Only take medicine as told by your doctor. Follow the directions carefully. The medicine does not work as well if you skip doses. Skipping doses also puts you at risk for problems.  Do not smoke.  Monitor your blood pressure at home as told by your doctor. GET HELP IF:  You think you are having a reaction to the medicine you are taking.  You have repeat headaches or feel dizzy.  You have  puffiness (swelling) in your ankles.  You have trouble with your vision. GET HELP RIGHT AWAY IF:   You get a very bad headache and are confused.  You feel weak, numb, or faint.  You get chest or belly (abdominal) pain.  You throw up (vomit).  You cannot breathe very well. MAKE SURE YOU:   Understand these instructions.  Will watch your condition.  Will get help right away if you are not doing well or get worse.   This information is not intended to replace advice given to you by your health care provider. Make sure you discuss any questions you have with your health care provider.   Document Released: 12/04/2007 Document Revised: 06/22/2013 Document Reviewed: 04/09/2013 Elsevier Interactive Patient Education 2016 Else[employer]  Muscle Cramps and Spasms Muscle cramps and spasms are when muscles tighten by themselves. They usually get better within minutes. Muscle cramps are painful. They are usually stronger and last longer than muscle spasms. Muscle spasms may or may not be painful. They can last a few seconds or much longer. HOME CARE  Drink enough fluid to keep your pee (urine) clear or pale yellow.  Massage, stretch, and relax the muscle.  Use a warm towel, heating pad, or warm shower water on tight muscles.  Place ice on the muscle if it is tender or in pain.  Put ice in a plastic bag.  Place a towel between your skin and the bag.  Leave the ice on for 15-20  minutes, 03-04 times a day.  Only take medicine as told by your doctor. GET HELP RIGHT AWAY IF:  Your cramps or spasms get worse, happen more often, or do not get better with time. MAKE SURE YOU:  Understand these instructions.  Will watch your condition.  Will get help right away if you are not doing well or get worse.   This information is not intended to replace advice given to you by your health care provider. Make sure you discuss any questions you have with your health care provider.   Document  Released: 05/30/2008 Document Revised: 10/12/2012 Document Reviewed: 06/03/2012 Elsevier Interactive Patient Education 2016 Elsevier Inc.   Back Pain, Adult Back pain is very common. The pain often gets better over time. The cause of back pain is usually not dangerous. Most people can learn to manage their back pain on their own.  HOME CARE  Watch your back pain for any changes. The following actions may help to lessen any pain you are feeling:  Stay active. Start with short walks on flat ground if you can. Try to walk farther each day.  Exercise regularly as told by your doctor. Exercise helps your back heal faster. It also helps avoid future injury by keeping your muscles strong and flexible.  Do not sit, drive, or stand in one place for more than 30 minutes.  Do not stay in bed. Resting more than 1-2 days can slow down your recovery.  Be careful when you bend or lift an object. Use good form when lifting:  Bend at your knees.  Keep the object close to your body.  Do not twist.  Sleep on a firm mattress. Lie on your side, and bend your knees. If you lie on your back, put a pillow under your knees.  Take medicines only as told by your doctor.  Put ice on the injured area.  Put ice in a plastic bag.  Place a towel between your skin and the bag.  Leave the ice on for 20 minutes, 2-3 times a day for the first 2-3 days. After that, you can switch between ice and heat packs.  Avoid feeling anxious or stressed. Find good ways to deal with stress, such as exercise.  Maintain a healthy weight. Extra weight puts stress on your back. GET HELP IF:   You have pain that does not go away with rest or medicine.  You have worsening pain that goes down into your legs or buttocks.  You have pain that does not get better in one week.  You have pain at night.  You lose weight.  You have a fever or chills. GET HELP RIGHT AWAY IF:   You cannot control when you poop (bowel movement)  or pee (urinate).  Your arms or legs feel weak.  Your arms or legs lose feeling (numbness).  You feel sick to your stomach (nauseous) or throw up (vomit).  You have belly (abdominal) pain.  You feel like you may pass out (faint).   This information is not intended to replace advice given to you by your health care provider. Make sure you discuss any questions you have with your health care provider.   Document Released: 12/04/2007 Document Revised: 07/08/2014 Document Reviewed: 10/19/2013 Elsevier Interactive Patient Education 2016 [place] Injury Prevention Back injuries can be very painful. They can also be difficult to heal. After having one back injury, you are more likely to injure your back again. It is important  to learn how to avoid injuring or re-injuring your back. The following tips can help you to prevent a back injury. WHAT SHOULD I KNOW ABOUT PHYSICAL FITNESS?  Exercise for 30 minutes per day on most days of the week or as told by your doctor. Make sure to:  Do aerobic exercises, such as walking, jogging, biking, or swimming.  Do exercises that increase balance and strength, such as tai chi and yoga.  Do stretching exercises. This helps with flexibility.  Try to develop strong belly (abdominal) muscles. Your belly muscles help to support your back.  Stay at a healthy weight. This helps to decrease your risk of a back injury. WHAT SHOULD I KNOW ABOUT MY DIET?  Talk with your doctor about your overall diet. Take supplements and vitamins only as told by your doctor.  Talk with your doctor about how much calcium and vitamin D you need each day. These nutrients help to prevent weakening of the bones (osteoporosis).  Include good sources of calcium in your diet, such as:  Dairy products.  Green leafy vegetables.  Products that have had calcium added to them (fortified).  Include good sources of vitamin D in your diet, such as:  Milk.  Foods that  have had vitamin D added to them. WHAT SHOULD I KNOW ABOUT MY POSTURE?  Sit up straight and stand up straight. Avoid leaning forward when you sit or hunching over when you stand.  Choose chairs that have good low-back (lumbar) support.  If you work at a desk, sit close to it so you do not need to lean over. Keep your chin tucked in. Keep your neck drawn back. Keep your elbows bent so your arms look like the letter "L" (right angle).  Sit high and close to the steering wheel when you drive. Add a low-back support to your car seat, if needed.  Avoid sitting or standing in one position for very long. Take breaks to get up, stretch, and walk around at least one time every hour. Take breaks every hour if you are driving for long periods of time.  Sleep on your side with your knees slightly bent, or sleep on your back with a pillow under your knees. Do not lie on the front of your body to sleep. WHAT SHOULD I KNOW ABOUT LIFTING, TWISTING, AND REACHING Lifting and Heavy Lifting  Avoid heavy lifting, especially lifting over and over again. If you must do heavy lifting:  Stretch before lifting.  Work slowly.  Rest between lifts.  Use a tool such as a cart or a dolly to move objects if one is available.  Make several small trips instead of carrying one heavy load.  Ask for help when you need it, especially when moving big objects.  Follow these steps when lifting:  Stand with your feet shoulder-width apart.  Get as close to the object as you can. Do not pick up a heavy object that is far from your body.  Use handles or lifting straps if they are available.  Bend at your knees. Squat down, but keep your heels off the floor.  Keep your shoulders back. Keep your chin tucked in. Keep your back straight.  Lift the object slowly while you tighten the muscles in your legs, belly, and butt. Keep the object as close to the center of your body as possible.  Follow these steps when putting  down a heavy load:  Stand with your feet shoulder-width apart.  Lower the object slowly  while you tighten the muscles in your legs, belly, and butt. Keep the object as close to the center of your body as possible.  Keep your shoulders back. Keep your chin tucked in. Keep your back straight.  Bend at your knees. Squat down, but keep your heels off the floor.  Use handles or lifting straps if they are available. Twisting and Reaching  Avoid lifting heavy objects above your waist.  Do not twist at your waist while you are lifting or carrying a load. If you need to turn, move your feet.  Do not bend over without bending at your knees.  Avoid reaching over your head, across a table, or for an object on a high surface.  WHAT ARE SOME OTHER TIPS?  Avoid wet floors and icy ground. Keep sidewalks clear of ice to prevent falls.   Do not sleep on a mattress that is too soft or too hard.   Keep items that you use often within easy reach.   Put heavier objects on shelves at waist level, and put lighter objects on lower or higher shelves.  Find ways to lower your stress, such as:  Exercise.  Massage.  Relaxation techniques.  Talk with your doctor if you feel anxious or depressed. These conditions can make back pain worse.  Wear flat heel shoes with cushioned soles.  Avoid making quick (sudden) movements.  Use both shoulder straps when carrying a backpack.  Do not use any tobacco products, including cigarettes, chewing tobacco, or electronic cigarettes. If you need help quitting, ask your doctor.   This information is not intended to replace advice given to you by your health care provider. Make sure you discuss any questions you have with your health care provider.   Document Released: 12/04/2007 Document Revised: 11/01/2014 Document Reviewed: 06/21/2014 Elsevier Interactive Patient Education [employer].

## 2015-12-19 NOTE — ED Notes (Signed)
Pt. Stated, I started having back pain and leg cramping yesterday  And I've been out of my BP med for 2 weeks.
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
## 2015-12-21 ENCOUNTER — Emergency Department (HOSPITAL_COMMUNITY)
Admission: EM | Admit: 2015-12-21 | Discharge: 2015-12-21 | Disposition: A | Discharge status: Home / Self Care | Payer: MEDICAID | Attending: Emergency Medicine | Admitting: Emergency Medicine

## 2015-12-21 DIAGNOSIS — M791 Myalgia, unspecified site: Secondary | ICD-10-CM

## 2015-12-21 DIAGNOSIS — E860 Dehydration: Secondary | ICD-10-CM | POA: Insufficient documentation

## 2015-12-21 DIAGNOSIS — N289 Disorder of kidney and ureter, unspecified: Secondary | ICD-10-CM

## 2015-12-21 DIAGNOSIS — Z7982 Long term (current) use of aspirin: Secondary | ICD-10-CM | POA: Insufficient documentation

## 2015-12-21 DIAGNOSIS — N189 Chronic kidney disease, unspecified: Secondary | ICD-10-CM | POA: Insufficient documentation

## 2015-12-21 DIAGNOSIS — I129 Hypertensive chronic kidney disease with stage 1 through stage 4 chronic kidney disease, or unspecified chronic kidney disease: Secondary | ICD-10-CM | POA: Insufficient documentation

## 2015-12-21 DIAGNOSIS — Z79899 Other long term (current) drug therapy: Secondary | ICD-10-CM | POA: Insufficient documentation

## 2015-12-21 LAB — URINALYSIS, ROUTINE W REFLEX MICROSCOPIC
Bilirubin Urine: NEGATIVE
Glucose, UA: NEGATIVE mg/dL
Hgb urine dipstick: NEGATIVE
Ketones, ur: NEGATIVE mg/dL
Leukocytes, UA: NEGATIVE
Nitrite: NEGATIVE
Protein, ur: NEGATIVE mg/dL
Specific Gravity, Urine: 1.021 (ref 1.005–1.030)
pH: 5 (ref 5.0–8.0)

## 2015-12-21 LAB — BASIC METABOLIC PANEL
Anion gap: 9 (ref 5–15)
BUN: 30 mg/dL — ABNORMAL HIGH (ref 6–20)
CO2: 26 mmol/L (ref 22–32)
Calcium: 9.6 mg/dL (ref 8.9–10.3)
Chloride: 102 mmol/L (ref 101–111)
Creatinine, Ser: 1.97 mg/dL — ABNORMAL HIGH (ref 0.61–1.24)
GFR calc Af Amer: 41 mL/min — ABNORMAL LOW (ref >60)
GFR calc non Af Amer: 36 mL/min — ABNORMAL LOW (ref >60)
Glucose, Bld: 97 mg/dL (ref 65–99)
Potassium: 3.4 mmol/L — ABNORMAL LOW (ref 3.5–5.1)
Sodium: 137 mmol/L (ref 135–145)

## 2015-12-21 LAB — CBC WITH DIFFERENTIAL/PLATELET
Basophils Absolute: 0 10*3/uL (ref 0.0–0.1)
Basophils Relative: 0 %
Eosinophils Absolute: 0.1 10*3/uL (ref 0.0–0.7)
Eosinophils Relative: 2 %
HCT: 44.3 % (ref 39.0–52.0)
Hemoglobin: 14.8 g/dL (ref 13.0–17.0)
Lymphocytes Relative: 21 %
Lymphs Abs: 1.2 10*3/uL (ref 0.7–4.0)
MCH: 31.2 pg (ref 26.0–34.0)
MCHC: 33.4 g/dL (ref 30.0–36.0)
MCV: 93.5 fL (ref 78.0–100.0)
Monocytes Absolute: 0.6 10*3/uL (ref 0.1–1.0)
Monocytes Relative: 10 %
Neutro Abs: 3.7 10*3/uL (ref 1.7–7.7)
Neutrophils Relative %: 67 %
Platelets: 338 10*3/uL (ref 150–400)
RBC: 4.74 MIL/uL (ref 4.22–5.81)
RDW: 13 % (ref 11.5–15.5)
WBC: 5.5 10*3/uL (ref 4.0–10.5)

## 2015-12-21 LAB — RAPID URINE DRUG SCREEN, HOSP PERFORMED
Amphetamines: NOT DETECTED
Barbiturates: NOT DETECTED
Benzodiazepines: NOT DETECTED
Cocaine: NOT DETECTED
Opiates: NOT DETECTED
Tetrahydrocannabinol: NOT DETECTED

## 2015-12-21 LAB — CK: Total CK: 405 U/L — ABNORMAL HIGH (ref 49–397)

## 2015-12-21 MED ORDER — METHOCARBAMOL 500 MG PO TABS: 500.0000 mg | ORAL_TABLET | Freq: Four times a day (QID) | ORAL | Status: DC | PRN

## 2015-12-21 MED ORDER — SODIUM CHLORIDE 0.9 % IV BOLUS (SEPSIS)
1000.0000 mL | Freq: Once | INTRAVENOUS | Status: AC
  Administered 2015-12-21: 1000 mL via INTRAVENOUS

## 2015-12-21 MED ORDER — FENTANYL CITRATE (PF) 100 MCG/2ML IJ SOLN
INTRAMUSCULAR | Status: AC
  Filled 2015-12-21: qty 2

## 2015-12-21 MED ORDER — FENTANYL CITRATE (PF) 100 MCG/2ML IJ SOLN
50.0000 ug | Freq: Once | INTRAMUSCULAR | Status: AC
  Administered 2015-12-21: 50 ug via INTRAVENOUS
  Filled 2015-12-21: qty 2

## 2015-12-21 NOTE — ED Notes (Signed)
Provider at bedside
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
## 2015-12-21 NOTE — Discharge Instructions (Signed)
Read the information below.  Use the prescribed medication as directed.  Please discuss all new medications with your pharmacist.  You may return to the Emergency Department at any time for worsening condition or any new symptoms that concern you.   If you develop uncontrolled pain, weakness or numbness of the extremity, severe discoloration of the skin, or you are unable to walk, return to the ER for a recheck.     It is very important that you drink plenty of water.  Do not take whey protein or creatine.  Rest over the next few days.  Follow up with your primary care provider on Monday morning to have your kidney function rechecked.  Return to the ER prior to this if you feel worse.     Dehydration, Adult Dehydration is a condition in which you do not have enough fluid or water in your body. It happens when you take in less fluid than you lose. Vital organs such as the kidneys, brain, and heart cannot function without a proper amount of fluids. Any loss of fluids from the body can cause dehydration.  Dehydration can range from mild to severe. This condition should be treated right away to help prevent it from becoming severe. CAUSES  This condition may be caused by:  Vomiting.  Diarrhea.  Excessive sweating, such as when exercising in hot or humid weather.  Not drinking enough fluid during strenuous exercise or during an illness.  Excessive urine output.  Fever.  Certain medicines. RISK FACTORS This condition is more likely to develop in:  People who are taking certain medicines that cause the body to lose excess fluid (diuretics).   People who have a chronic illness, such as diabetes, that may increase urination.  Older adults.   People who live at high altitudes.   People who participate in endurance sports.  SYMPTOMS  Mild Dehydration  Thirst.  Dry lips.  Slightly dry mouth.  Dry, warm skin. Moderate Dehydration  Very dry mouth.   Muscle cramps.   Dark  urine and decreased urine production.   Decreased tear production.   Headache.   Light-headedness, especially when you stand up from a sitting position.  Severe Dehydration  Changes in skin.   Cold and clammy skin.   Skin does not spring back quickly when lightly pinched and released.   Changes in body fluids.   Extreme thirst.   No tears.   Not able to sweat when body temperature is high, such as in hot weather.   Minimal urine production.   Changes in vital signs.   Rapid, weak pulse (more than 100 beats per minute when you are sitting still).   Rapid breathing.   Low blood pressure.   Other changes.   Sunken eyes.   Cold hands and feet.   Confusion.  Lethargy and difficulty being awakened.  Fainting (syncope).   Short-term weight loss.   Unconsciousness. DIAGNOSIS  This condition may be diagnosed based on your symptoms. You may also have tests to determine how severe your dehydration is. These tests may include:   Urine tests.   Blood tests.  TREATMENT  Treatment for this condition depends on the severity. Mild or moderate dehydration can often be treated at home. Treatment should be started right away. Do not wait until dehydration becomes severe. Severe dehydration needs to be treated at the hospital. Treatment for Mild Dehydration  Drinking plenty of water to replace the fluid you have lost.   Replacing minerals in  your blood (electrolytes) that you may have lost.  Treatment for Moderate Dehydration  Consuming oral rehydration solution (ORS). Treatment for Severe Dehydration  Receiving fluid through an IV tube.   Receiving electrolyte solution through a feeding tube that is passed through your nose and into your stomach (nasogastric tube or NG tube).  Correcting any abnormalities in electrolytes. HOME CARE INSTRUCTIONS   Drink enough fluid to keep your urine clear or pale yellow.   Drink water or fluid slowly  by taking small sips. You can also try sucking on ice cubes.  Have food or beverages that contain electrolytes. Examples include bananas and sports drinks.  Take over-the-counter and prescription medicines only as told by your health care provider.   Prepare ORS according to the manufacturer's instructions. Take sips of ORS every 5 minutes until your urine returns to normal.  If you have vomiting or diarrhea, continue to try to drink water, ORS, or both.   If you have diarrhea, avoid:   Beverages that contain caffeine.   Fruit juice.   Milk.   Carbonated soft drinks.  Do not take salt tablets. This can lead to the condition of having too much sodium in your body (hypernatremia).  SEEK MEDICAL CARE IF:  You cannot eat or drink without vomiting.  You have had moderate diarrhea during a period of more than 24 hours.  You have a fever. SEEK IMMEDIATE MEDICAL CARE IF:   You have extreme thirst.  You have severe diarrhea.  You have not urinated in 6-8 hours, or you have urinated only a small amount of very dark urine.  You have shriveled skin.  You are dizzy, confused, or both.   This information is not intended to replace advice given to you by your health care provider. Make sure you discuss any questions you have with your health care provider.   Document Released: 06/17/2005 Document Revised: 03/08/2015 Document Reviewed: 11/02/2014 Elsevier Interactive Patient Education 2016 [employer]  Muscle Pain, Adult Muscle pain (myalgia) may be caused by many things, including:  Overuse or muscle strain, especially if you are not in shape. This is the most common cause of muscle pain.  Injury.  Bruises.  Viruses, such as the flu.  Infectious diseases.  Fibromyalgia, which is a chronic condition that causes muscle tenderness, fatigue, and headache.  Autoimmune diseases, including lupus.  Certain drugs, including ACE inhibitors and statins. Muscle pain  may be mild or severe. In most cases, the pain lasts only a short time and goes away without treatment. To diagnose the cause of your muscle pain, your health care provider will take your medical history. This means he or she will ask you when your muscle pain began and what has been happening. If you have not had muscle pain for very long, your health care provider may want to wait before doing much testing. If your muscle pain has lasted a long time, your health care provider may want to run tests right away. If your health care provider thinks your muscle pain may be caused by illness, you may need to have additional tests to rule out certain conditions.  Treatment for muscle pain depends on the cause. Home care is often enough to relieve muscle pain. Your health care provider may also prescribe anti-inflammatory medicine. HOME CARE INSTRUCTIONS Watch your condition for any changes. The following actions may help to lessen any discomfort you are feeling:  Only take over-the-counter or prescription medicines as directed by your health care provider.  Apply ice to the sore muscle:  Put ice in a plastic bag.  Place a towel between your skin and the bag.  Leave the ice on for 15-20 minutes, 3-4 times a day.  You may alternate applying hot and cold packs to the muscle as directed by your health care provider.  If overuse is causing your muscle pain, slow down your activities until the pain goes away.  Remember that it is normal to feel some muscle pain after starting a workout program. Muscles that have not been used often will be sore at first.  Do regular, gentle exercises if you are not usually active.  Warm up before exercising to lower your risk of muscle pain.  Do not continue working out if the pain is very bad. Bad pain could mean you have injured a muscle. SEEK MEDICAL CARE IF:  Your muscle pain gets worse, and medicines do not help.  You have muscle pain that lasts longer than 3  days.  You have a rash or fever along with muscle pain.  You have muscle pain after a tick bite.  You have muscle pain while working out, even though you are in good physical condition.  You have redness, soreness, or swelling along with muscle pain.  You have muscle pain after starting a new medicine or changing the dose of a medicine. SEEK IMMEDIATE MEDICAL CARE IF:  You have trouble breathing.  You have trouble swallowing.  You have muscle pain along with a stiff neck, fever, and vomiting.  You have severe muscle weakness or cannot move part of your body. MAKE SURE YOU:   Understand these instructions.  Will watch your condition.  Will get help right away if you are not doing well or get worse.   This information is not intended to replace advice given to you by your health care provider. Make sure you discuss any questions you have with your health care provider.   Document Released: 05/09/2006 Document Revised: 07/08/2014 Document Reviewed: 04/13/2013 Elsevier Interactive Patient Education [employer].

## 2015-12-21 NOTE — ED Notes (Signed)
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

## 2016-03-25 ENCOUNTER — Encounter (HOSPITAL_COMMUNITY): Payer: Self-pay | Admitting: Emergency Medicine

## 2016-03-25 ENCOUNTER — Emergency Department (HOSPITAL_COMMUNITY)
Admission: EM | Admit: 2016-03-25 | Discharge: 2016-03-25 | Disposition: A | Discharge status: Home / Self Care | Payer: Self-pay | Attending: Emergency Medicine | Admitting: Emergency Medicine

## 2016-03-25 DIAGNOSIS — N189 Chronic kidney disease, unspecified: Secondary | ICD-10-CM | POA: Insufficient documentation

## 2016-03-25 DIAGNOSIS — I129 Hypertensive chronic kidney disease with stage 1 through stage 4 chronic kidney disease, or unspecified chronic kidney disease: Secondary | ICD-10-CM | POA: Insufficient documentation

## 2016-03-25 DIAGNOSIS — R51 Headache: Secondary | ICD-10-CM | POA: Insufficient documentation

## 2016-03-25 DIAGNOSIS — I10 Essential (primary) hypertension: Secondary | ICD-10-CM

## 2016-03-25 DIAGNOSIS — Z7982 Long term (current) use of aspirin: Secondary | ICD-10-CM | POA: Insufficient documentation

## 2016-03-25 DIAGNOSIS — Z79899 Other long term (current) drug therapy: Secondary | ICD-10-CM | POA: Insufficient documentation

## 2016-03-25 DIAGNOSIS — R519 Headache, unspecified: Secondary | ICD-10-CM

## 2016-03-25 LAB — COMPREHENSIVE METABOLIC PANEL
ALT: 66 U/L — ABNORMAL HIGH (ref 17–63)
AST: 60 U/L — ABNORMAL HIGH (ref 15–41)
Albumin: 4.1 g/dL (ref 3.5–5.0)
Alkaline Phosphatase: 46 U/L (ref 38–126)
Anion gap: 9 (ref 5–15)
BUN: 19 mg/dL (ref 6–20)
CO2: 27 mmol/L (ref 22–32)
Calcium: 9.7 mg/dL (ref 8.9–10.3)
Chloride: 103 mmol/L (ref 101–111)
Creatinine, Ser: 1.57 mg/dL — ABNORMAL HIGH (ref 0.61–1.24)
GFR calc Af Amer: 54 mL/min — ABNORMAL LOW (ref >60)
GFR calc non Af Amer: 47 mL/min — ABNORMAL LOW (ref >60)
Glucose, Bld: 92 mg/dL (ref 65–99)
Potassium: 3.7 mmol/L (ref 3.5–5.1)
Sodium: 139 mmol/L (ref 135–145)
Total Bilirubin: 1.1 mg/dL (ref 0.3–1.2)
Total Protein: 7.7 g/dL (ref 6.5–8.1)

## 2016-03-25 LAB — CBC
HCT: 44.6 % (ref 39.0–52.0)
Hemoglobin: 15.1 g/dL (ref 13.0–17.0)
MCH: 31.5 pg (ref 26.0–34.0)
MCHC: 33.9 g/dL (ref 30.0–36.0)
MCV: 92.9 fL (ref 78.0–100.0)
Platelets: 262 10*3/uL (ref 150–400)
RBC: 4.8 MIL/uL (ref 4.22–5.81)
RDW: 12.6 % (ref 11.5–15.5)
WBC: 5.5 10*3/uL (ref 4.0–10.5)

## 2016-03-25 MED ORDER — HYDRALAZINE HCL 25 MG PO TABS
50.0000 mg | ORAL_TABLET | Freq: Once | ORAL | Status: AC
  Administered 2016-03-25: 50 mg via ORAL
  Filled 2016-03-25: qty 2

## 2016-03-25 MED ORDER — SPIRONOLACTONE 25 MG PO TABS: 12.5000 mg | ORAL_TABLET | Freq: Every day | ORAL | 0 refills | Status: DC

## 2016-03-25 MED ORDER — AMLODIPINE BESYLATE 5 MG PO TABS
10.0000 mg | ORAL_TABLET | Freq: Once | ORAL | Status: AC
  Administered 2016-03-25: 10 mg via ORAL
  Filled 2016-03-25: qty 2

## 2016-03-25 MED ORDER — LISINOPRIL 20 MG PO TABS
20.0000 mg | ORAL_TABLET | Freq: Once | ORAL | Status: AC
  Administered 2016-03-25: 20 mg via ORAL
  Filled 2016-03-25: qty 1

## 2016-03-25 MED ORDER — METOCLOPRAMIDE HCL 10 MG PO TABS
10.0000 mg | ORAL_TABLET | Freq: Once | ORAL | Status: AC
  Administered 2016-03-25: 10 mg via ORAL
  Filled 2016-03-25: qty 1

## 2016-03-25 MED ORDER — AMLODIPINE BESYLATE 10 MG PO TABS: 10.0000 mg | ORAL_TABLET | Freq: Every day | ORAL | 0 refills | Status: DC

## 2016-03-25 MED ORDER — DIPHENHYDRAMINE HCL 25 MG PO CAPS
25.0000 mg | ORAL_CAPSULE | Freq: Once | ORAL | Status: AC
  Administered 2016-03-25: 25 mg via ORAL
  Filled 2016-03-25: qty 1

## 2016-03-25 MED FILL — ?HYDROCHLOROTHIAZIDE 12.5MG: 12.5 | 30 days supply | Qty: 30 | Fill #3

## 2016-03-25 MED FILL — ?LISINOPRIL 20 MG TABLET: 20 | 30 days supply | Qty: 30 | Fill #3

## 2016-03-25 MED FILL — AMLODIPINE BESYLATE 10 MG T: 10 | 30 days supply | Qty: 30 | Fill #0

## 2016-03-25 MED FILL — hydrALAZINE HCL 50 MG TABS: 50 | 30 days supply | Qty: 90 | Fill #2
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
## 2016-03-25 NOTE — ED Triage Notes (Signed)
Pt here for htn and HA x several days; pt sts taking his meds

## 2016-03-25 NOTE — ED Notes (Signed)
Pt ambulated to room from waiting room, tolerated well. 

## 2016-04-01 ENCOUNTER — Ambulatory Visit: Payer: Self-pay | Attending: Family Medicine | Admitting: Family Medicine

## 2016-04-01 ENCOUNTER — Encounter: Payer: Self-pay | Admitting: Family Medicine

## 2016-04-01 VITALS — BP 142/78 | HR 71 | Temp 98.5°F | Ht 65.0 in | Wt 171.4 lb

## 2016-04-01 DIAGNOSIS — J309 Allergic rhinitis, unspecified: Secondary | ICD-10-CM

## 2016-04-01 DIAGNOSIS — R51 Headache: Secondary | ICD-10-CM | POA: Insufficient documentation

## 2016-04-01 DIAGNOSIS — Z23 Encounter for immunization: Secondary | ICD-10-CM

## 2016-04-01 DIAGNOSIS — N189 Chronic kidney disease, unspecified: Secondary | ICD-10-CM | POA: Insufficient documentation

## 2016-04-01 DIAGNOSIS — G44201 Tension-type headache, unspecified, intractable: Secondary | ICD-10-CM

## 2016-04-01 DIAGNOSIS — Z7982 Long term (current) use of aspirin: Secondary | ICD-10-CM | POA: Insufficient documentation

## 2016-04-01 DIAGNOSIS — Z79899 Other long term (current) drug therapy: Secondary | ICD-10-CM | POA: Insufficient documentation

## 2016-04-01 DIAGNOSIS — I129 Hypertensive chronic kidney disease with stage 1 through stage 4 chronic kidney disease, or unspecified chronic kidney disease: Secondary | ICD-10-CM | POA: Insufficient documentation

## 2016-04-01 MED ORDER — FLUTICASONE PROPIONATE 50 MCG/ACT NA SUSP: 2.0000 | Freq: Every day | NASAL | 6 refills | Status: DC

## 2016-04-01 MED ORDER — TRAMADOL HCL 50 MG PO TABS: 50.0000 mg | ORAL_TABLET | Freq: Three times a day (TID) | ORAL | 0 refills | Status: DC | PRN

## 2016-04-01 MED ORDER — KETOROLAC TROMETHAMINE 60 MG/2ML IM SOLN
60.0000 mg | Freq: Once | INTRAMUSCULAR | Status: AC
  Administered 2016-04-01: 60 mg via INTRAMUSCULAR

## 2016-04-01 MED FILL — FLUTICASONE PROP 50 MCG SPR: 50 | 20 days supply | Qty: 16 | Fill #0

## 2016-04-01 MED FILL — traMADol HCL 50 MG TABS: 50 | 6 days supply | Qty: 20 | Fill #0

## 2016-04-01 NOTE — Progress Notes (Signed)
Pt has been having constant headache for 2 weeks.  Pt is getting flu shot today.

## 2016-04-01 NOTE — Patient Instructions (Addendum)
Mazen was seen today for headache.  Diagnoses and all orders for this visit:  Intractable tension-type headache, unspecified chronicity pattern -     ketorolac (TORADOL) injection 60 mg; Inject 2 mLs (60 mg total) into the muscle once. -     traMADol (ULTRAM) 50 MG tablet; Take 1 tablet (50 mg total) by mouth every 8 (eight) hours as needed.  Acute allergic rhinitis, unspecified seasonality, unspecified trigger -     fluticasone (FLONASE) 50 MCG/ACT nasal spray; Place 2 sprays into both nostrils daily.   For 2 weeks of headache Shot of strong antiinflammatory, Toradol Follow this up with flonase, nasal steroid due to swelling noted on exam You will also have tramadol if pain persist, this is a pain medicine  F/u in 1 week for headache and BP check  Dr. Adrian Blackwater

## 2016-04-01 NOTE — Assessment & Plan Note (Signed)
Tension type HA in HTN patient with fairly well controlled BP. Swollen nasal turbinates on exam   Plan: Toradol shot Tramadol for pain Flonase
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
## 2016-04-08 ENCOUNTER — Ambulatory Visit: Payer: Self-pay | Admitting: Family Medicine

## 2016-06-06 ENCOUNTER — Emergency Department (HOSPITAL_BASED_OUTPATIENT_CLINIC_OR_DEPARTMENT_OTHER)
Admission: EM | Admit: 2016-06-06 | Discharge: 2016-06-06 | Disposition: A | Discharge status: Home / Self Care | Payer: Self-pay | Attending: Emergency Medicine | Admitting: Emergency Medicine

## 2016-06-06 ENCOUNTER — Encounter (HOSPITAL_BASED_OUTPATIENT_CLINIC_OR_DEPARTMENT_OTHER): Payer: Self-pay | Admitting: *Deleted

## 2016-06-06 ENCOUNTER — Emergency Department (HOSPITAL_BASED_OUTPATIENT_CLINIC_OR_DEPARTMENT_OTHER): Payer: Self-pay

## 2016-06-06 DIAGNOSIS — S86811A Strain of other muscle(s) and tendon(s) at lower leg level, right leg, initial encounter: Secondary | ICD-10-CM | POA: Insufficient documentation

## 2016-06-06 DIAGNOSIS — N189 Chronic kidney disease, unspecified: Secondary | ICD-10-CM | POA: Insufficient documentation

## 2016-06-06 DIAGNOSIS — Y929 Unspecified place or not applicable: Secondary | ICD-10-CM | POA: Insufficient documentation

## 2016-06-06 DIAGNOSIS — I129 Hypertensive chronic kidney disease with stage 1 through stage 4 chronic kidney disease, or unspecified chronic kidney disease: Secondary | ICD-10-CM | POA: Insufficient documentation

## 2016-06-06 DIAGNOSIS — X501XXA Overexertion from prolonged static or awkward postures, initial encounter: Secondary | ICD-10-CM | POA: Insufficient documentation

## 2016-06-06 DIAGNOSIS — G44201 Tension-type headache, unspecified, intractable: Secondary | ICD-10-CM

## 2016-06-06 DIAGNOSIS — Y9367 Activity, basketball: Secondary | ICD-10-CM | POA: Insufficient documentation

## 2016-06-06 DIAGNOSIS — Y998 Other external cause status: Secondary | ICD-10-CM | POA: Insufficient documentation

## 2016-06-06 DIAGNOSIS — S86111A Strain of other muscle(s) and tendon(s) of posterior muscle group at lower leg level, right leg, initial encounter: Secondary | ICD-10-CM

## 2016-06-06 MED ORDER — IBUPROFEN 800 MG PO TABS
800.0000 mg | ORAL_TABLET | Freq: Once | ORAL | Status: AC
  Administered 2016-06-06: 800 mg via ORAL
  Filled 2016-06-06: qty 1

## 2016-06-06 MED ORDER — LISINOPRIL 10 MG PO TABS
20.0000 mg | ORAL_TABLET | Freq: Once | ORAL | Status: AC
  Administered 2016-06-06: 20 mg via ORAL
  Filled 2016-06-06: qty 2

## 2016-06-06 MED ORDER — HYDRALAZINE HCL 10 MG PO TABS
10.0000 mg | ORAL_TABLET | Freq: Once | ORAL | Status: DC
  Filled 2016-06-06: qty 1

## 2016-06-06 MED ORDER — TRAMADOL HCL 50 MG PO TABS: 50.0000 mg | ORAL_TABLET | Freq: Three times a day (TID) | ORAL | 0 refills | Status: DC | PRN

## 2016-06-06 MED ORDER — AMLODIPINE BESYLATE 5 MG PO TABS
10.0000 mg | ORAL_TABLET | Freq: Once | ORAL | Status: AC
  Administered 2016-06-06: 10 mg via ORAL
  Filled 2016-06-06: qty 2

## 2016-06-06 NOTE — Discharge Instructions (Signed)
Medications: Tramadol  Treatment: Take tramadol every 8 hours as needed for severe pain. You can take Tylenol every 4 hours between this medication for mild to moderate pain. Use ice 3-4 times daily alternating 20 minutes on, 20 minutes off. Keep your leg elevated when you're not ambulating. Use crutches to ambulate and wear boot at all times except when bathing.  Follow-up: Please follow-up with Dr. Barbaraann Barthel, a sports medicine doctor, for further evaluation and treatment of your symptoms. Please return to the emergency department if you develop any new or worsening symptoms.

## 2016-06-06 NOTE — ED Notes (Signed)
ED Provider at bedside. 

## 2016-06-06 NOTE — ED Triage Notes (Signed)
Pt c/o  Right calf injury while playing basketball x 1 hr ago
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
## 2016-06-11 ENCOUNTER — Inpatient Hospital Stay: Payer: Self-pay

## 2016-06-13 ENCOUNTER — Ambulatory Visit: Payer: Self-pay | Attending: Internal Medicine | Admitting: Physician Assistant

## 2016-06-13 ENCOUNTER — Encounter: Payer: Self-pay | Admitting: Physician Assistant

## 2016-06-13 ENCOUNTER — Ambulatory Visit: Payer: Self-pay | Admitting: Family Medicine

## 2016-06-13 VITALS — BP 160/95 | HR 64 | Temp 98.5°F | Resp 16 | Wt 182.2 lb

## 2016-06-13 DIAGNOSIS — Z79899 Other long term (current) drug therapy: Secondary | ICD-10-CM | POA: Insufficient documentation

## 2016-06-13 DIAGNOSIS — Z7982 Long term (current) use of aspirin: Secondary | ICD-10-CM | POA: Insufficient documentation

## 2016-06-13 DIAGNOSIS — Y9367 Activity, basketball: Secondary | ICD-10-CM | POA: Insufficient documentation

## 2016-06-13 DIAGNOSIS — I10 Essential (primary) hypertension: Secondary | ICD-10-CM | POA: Insufficient documentation

## 2016-06-13 DIAGNOSIS — X58XXXA Exposure to other specified factors, initial encounter: Secondary | ICD-10-CM | POA: Insufficient documentation

## 2016-06-13 DIAGNOSIS — S86811A Strain of other muscle(s) and tendon(s) at lower leg level, right leg, initial encounter: Secondary | ICD-10-CM | POA: Insufficient documentation

## 2016-06-13 MED ORDER — AMLODIPINE BESYLATE 10 MG PO TABS: 10.0000 mg | ORAL_TABLET | Freq: Every day | ORAL | 5 refills | Status: DC

## 2016-06-13 MED ORDER — LISINOPRIL 20 MG PO TABS: 20.0000 mg | ORAL_TABLET | Freq: Every day | ORAL | 5 refills | Status: DC

## 2016-06-13 MED ORDER — HYDRALAZINE HCL 50 MG PO TABS: 50.0000 mg | ORAL_TABLET | Freq: Three times a day (TID) | ORAL | 3 refills | Status: DC

## 2016-06-13 MED FILL — LISINOPRIL 20 MG TABLET: 20 | 30 days supply | Qty: 30 | Fill #0

## 2016-06-13 MED FILL — hydrALAZINE HCL 50 MG TABS: 50 | 30 days supply | Qty: 90 | Fill #0

## 2016-06-13 MED FILL — AMLODIPINE BESYLATE 10 MG T: 10 | 30 days supply | Qty: 30 | Fill #0

## 2016-06-13 MED FILL — traMADol HCL 50 MG TABS: 50 | 5 days supply | Qty: 15 | Fill #0

## 2016-06-13 NOTE — Patient Instructions (Signed)
Check Blood pressure out of the office 2-3 times each week and record and bring this in to your next visit.
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
## 2016-06-14 ENCOUNTER — Ambulatory Visit (INDEPENDENT_AMBULATORY_CARE_PROVIDER_SITE_OTHER): Payer: Self-pay | Admitting: Family Medicine

## 2016-06-14 ENCOUNTER — Encounter: Payer: Self-pay | Admitting: Family Medicine

## 2016-06-14 DIAGNOSIS — S86819A Strain of other muscle(s) and tendon(s) at lower leg level, unspecified leg, initial encounter: Secondary | ICD-10-CM | POA: Insufficient documentation

## 2016-06-14 NOTE — Assessment & Plan Note (Signed)
Continue with cam walker but switch to heel lift in regular supportive shoes when possible.  Icing, shown ROM exercises to do regularly.  Compression when he can tolerate this.  Elevation.  Tylenol/aleve if needed.  F/u in 3 weeks.

## 2016-06-14 NOTE — Patient Instructions (Signed)
You have a calf strain Compression sleeve or ace wrap to help with swelling and pain when you're able to tolerate this. Icing (or heat at this point) for 15 minutes at a time 3-4 times a day Switch from boot to heel lifts in a supportive shoe when you can tolerate this. Tylenol and/or aleve for pain. Start ankle range of motion exercises (up/down and alphabet exercises). Follow up with me just after new years to reevaluate. If you feel better sooner and that you can return to work, call me.
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
## 2016-06-21 ENCOUNTER — Telehealth: Payer: Self-pay | Admitting: Family Medicine

## 2016-06-21 NOTE — Telephone Encounter (Signed)
Ok I put some restrictions for maximum amount of standing/walking per hour for next couple weeks.  Thanks!

## 2016-07-02 ENCOUNTER — Ambulatory Visit: Payer: Self-pay | Attending: Family Medicine | Admitting: Family Medicine

## 2016-07-02 ENCOUNTER — Encounter: Payer: Self-pay | Admitting: Family Medicine

## 2016-07-02 VITALS — BP 146/100 | HR 58 | Temp 98.2°F | Ht 66.0 in | Wt 180.0 lb

## 2016-07-02 DIAGNOSIS — Z79899 Other long term (current) drug therapy: Secondary | ICD-10-CM | POA: Insufficient documentation

## 2016-07-02 DIAGNOSIS — N189 Chronic kidney disease, unspecified: Secondary | ICD-10-CM | POA: Insufficient documentation

## 2016-07-02 DIAGNOSIS — I10 Essential (primary) hypertension: Secondary | ICD-10-CM

## 2016-07-02 DIAGNOSIS — Z7982 Long term (current) use of aspirin: Secondary | ICD-10-CM | POA: Insufficient documentation

## 2016-07-02 DIAGNOSIS — Z23 Encounter for immunization: Secondary | ICD-10-CM

## 2016-07-02 DIAGNOSIS — Z0000 Encounter for general adult medical examination without abnormal findings: Secondary | ICD-10-CM

## 2016-07-02 DIAGNOSIS — I129 Hypertensive chronic kidney disease with stage 1 through stage 4 chronic kidney disease, or unspecified chronic kidney disease: Secondary | ICD-10-CM | POA: Insufficient documentation

## 2016-07-02 MED ORDER — SPIRONOLACTONE 25 MG PO TABS: 25.0000 mg | ORAL_TABLET | Freq: Every day | ORAL | 5 refills | Status: DC

## 2016-07-02 NOTE — Patient Instructions (Addendum)
Corey Guerrero was seen today for hypertension.  Diagnoses and all orders for this visit:  Healthcare maintenance -     Ambulatory referral to Gastroenterology  Uncontrolled hypertension -     spironolactone (ALDACTONE) 25 MG tablet; Take 1 tablet (25 mg total) by mouth daily. -     COMPLETE METABOLIC PANEL WITH GFR   For nose bleeds Use nasal saline gel or spray to keep nose tissue moist Reducing BP also prevents nose bleeds  Goal BP is < 140/90 at all times, ideally < 130/80  Restart aldactone 25 mg daily   Dr. Adrian Blackwater
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
## 2016-07-02 NOTE — Assessment & Plan Note (Signed)
Improved but still elevated Compliant with all meds except aldactone 12.5 mg daily  Plan: Restart aldactone at 25 mg daily CMP Goal BP < 130/80

## 2016-07-03 LAB — COMPLETE METABOLIC PANEL WITH GFR
ALT: 37 U/L (ref 9–46)
AST: 35 U/L (ref 10–35)
Albumin: 4.4 g/dL (ref 3.6–5.1)
Alkaline Phosphatase: 47 U/L (ref 40–115)
BUN: 24 mg/dL (ref 7–25)
CO2: 28 mmol/L (ref 20–31)
Calcium: 9.8 mg/dL (ref 8.6–10.3)
Chloride: 101 mmol/L (ref 98–110)
Creat: 1.41 mg/dL — ABNORMAL HIGH (ref 0.70–1.33)
GFR, Est African American: 63 mL/min (ref >=60)
GFR, Est Non African American: 54 mL/min — ABNORMAL LOW (ref >=60)
Glucose, Bld: 86 mg/dL (ref 65–99)
Potassium: 4 mmol/L (ref 3.5–5.3)
Sodium: 139 mmol/L (ref 135–146)
Total Bilirubin: 0.6 mg/dL (ref 0.2–1.2)
Total Protein: 8 g/dL (ref 6.1–8.1)

## 2016-07-04 ENCOUNTER — Telehealth: Payer: Self-pay

## 2016-07-04 NOTE — Telephone Encounter (Addendum)
Requested return call to [facility].  When patient calls please advise per Dr. Adrian Blackwater: - Stable renal function Need obtain tight BP control

## 2016-07-05 ENCOUNTER — Telehealth: Payer: Self-pay

## 2016-07-05 NOTE — Telephone Encounter (Signed)
Pt was called and a VM was left informing pt to return phone call for lab results.

## 2016-07-10 ENCOUNTER — Telehealth: Payer: Self-pay

## 2016-07-10 NOTE — Telephone Encounter (Signed)
Pt was called and informed to return phone call for lab results.

## 2016-07-24 ENCOUNTER — Telehealth: Payer: Self-pay

## 2016-07-24 NOTE — Telephone Encounter (Signed)
Pt was called and informed of lab results. 

## 2016-09-12 MED FILL — LISINOPRIL 20 MG TABLET: 20 | 30 days supply | Qty: 30 | Fill #1

## 2016-09-12 MED FILL — SPIRONOLACTONE 25 MG TABLET: 25 | 30 days supply | Qty: 30 | Fill #0

## 2016-09-12 MED FILL — hydrALAZINE HCL 50 MG TABS: 50 | 30 days supply | Qty: 90 | Fill #1

## 2016-09-12 MED FILL — ?AMLODIPINE BESYLATE 10 MG: 10 | 30 days supply | Qty: 30 | Fill #1

## 2016-10-01 ENCOUNTER — Emergency Department (HOSPITAL_COMMUNITY)
Admission: EM | Admit: 2016-10-01 | Discharge: 2016-10-01 | Disposition: A | Discharge status: Home / Self Care | Payer: Self-pay | Attending: Emergency Medicine | Admitting: Emergency Medicine

## 2016-10-01 ENCOUNTER — Encounter (HOSPITAL_COMMUNITY): Payer: Self-pay | Admitting: Emergency Medicine

## 2016-10-01 DIAGNOSIS — N182 Chronic kidney disease, stage 2 (mild): Secondary | ICD-10-CM | POA: Insufficient documentation

## 2016-10-01 DIAGNOSIS — I10 Essential (primary) hypertension: Secondary | ICD-10-CM

## 2016-10-01 DIAGNOSIS — G44201 Tension-type headache, unspecified, intractable: Secondary | ICD-10-CM

## 2016-10-01 DIAGNOSIS — Y929 Unspecified place or not applicable: Secondary | ICD-10-CM | POA: Insufficient documentation

## 2016-10-01 DIAGNOSIS — X58XXXA Exposure to other specified factors, initial encounter: Secondary | ICD-10-CM | POA: Insufficient documentation

## 2016-10-01 DIAGNOSIS — Y939 Activity, unspecified: Secondary | ICD-10-CM | POA: Insufficient documentation

## 2016-10-01 DIAGNOSIS — I129 Hypertensive chronic kidney disease with stage 1 through stage 4 chronic kidney disease, or unspecified chronic kidney disease: Secondary | ICD-10-CM | POA: Insufficient documentation

## 2016-10-01 DIAGNOSIS — S39012A Strain of muscle, fascia and tendon of lower back, initial encounter: Secondary | ICD-10-CM | POA: Insufficient documentation

## 2016-10-01 DIAGNOSIS — Z7982 Long term (current) use of aspirin: Secondary | ICD-10-CM | POA: Insufficient documentation

## 2016-10-01 DIAGNOSIS — Z79899 Other long term (current) drug therapy: Secondary | ICD-10-CM | POA: Insufficient documentation

## 2016-10-01 DIAGNOSIS — Y999 Unspecified external cause status: Secondary | ICD-10-CM | POA: Insufficient documentation

## 2016-10-01 MED ORDER — HYDROMORPHONE HCL 1 MG/ML IJ SOLN
1.0000 mg | Freq: Once | INTRAMUSCULAR | Status: AC
  Administered 2016-10-01: 1 mg via INTRAMUSCULAR
  Filled 2016-10-01: qty 1

## 2016-10-01 MED ORDER — CYCLOBENZAPRINE HCL 10 MG PO TABS: 10.0000 mg | ORAL_TABLET | Freq: Two times a day (BID) | ORAL | 0 refills | Status: DC | PRN

## 2016-10-01 MED ORDER — NAPROXEN 500 MG PO TABS: 500.0000 mg | ORAL_TABLET | Freq: Two times a day (BID) | ORAL | 0 refills | Status: DC

## 2016-10-01 MED ORDER — TRAMADOL HCL 50 MG PO TABS: 50.0000 mg | ORAL_TABLET | Freq: Three times a day (TID) | ORAL | 0 refills | Status: DC | PRN

## 2016-10-01 NOTE — ED Triage Notes (Signed)
Pt reports L lateral thigh pain radiating up to lower back. Hx of pulled hamstring. No recent known injury.

## 2016-10-01 NOTE — Discharge Instructions (Signed)
Your symptoms may be related to muscle strain. There is the possibility that this could be related to sciatica which is an impingement of the nerve root from your lower back that runs down the back of the leg.   Follow-up with your primary care doctor in 1 week if the symptoms have not resolved.   Take the medications as prescribed.  Monitor for numbness or weakness in your leg.  Follow-up with your primary care doctor to check on your blood pressure.

## 2016-10-01 NOTE — ED Notes (Signed)
Patient is A & O x4.  He understood AVS instructions, no questions asked.
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
## 2016-10-12 IMAGING — DX DG CHEST 2V
2 series · 2 of 2 positions shown · non-contrast
Comparison: 04/05/2015 chest radiograph

CLINICAL DATA: Chest pain

EXAM:
CHEST  2 VIEW

[chest pa]
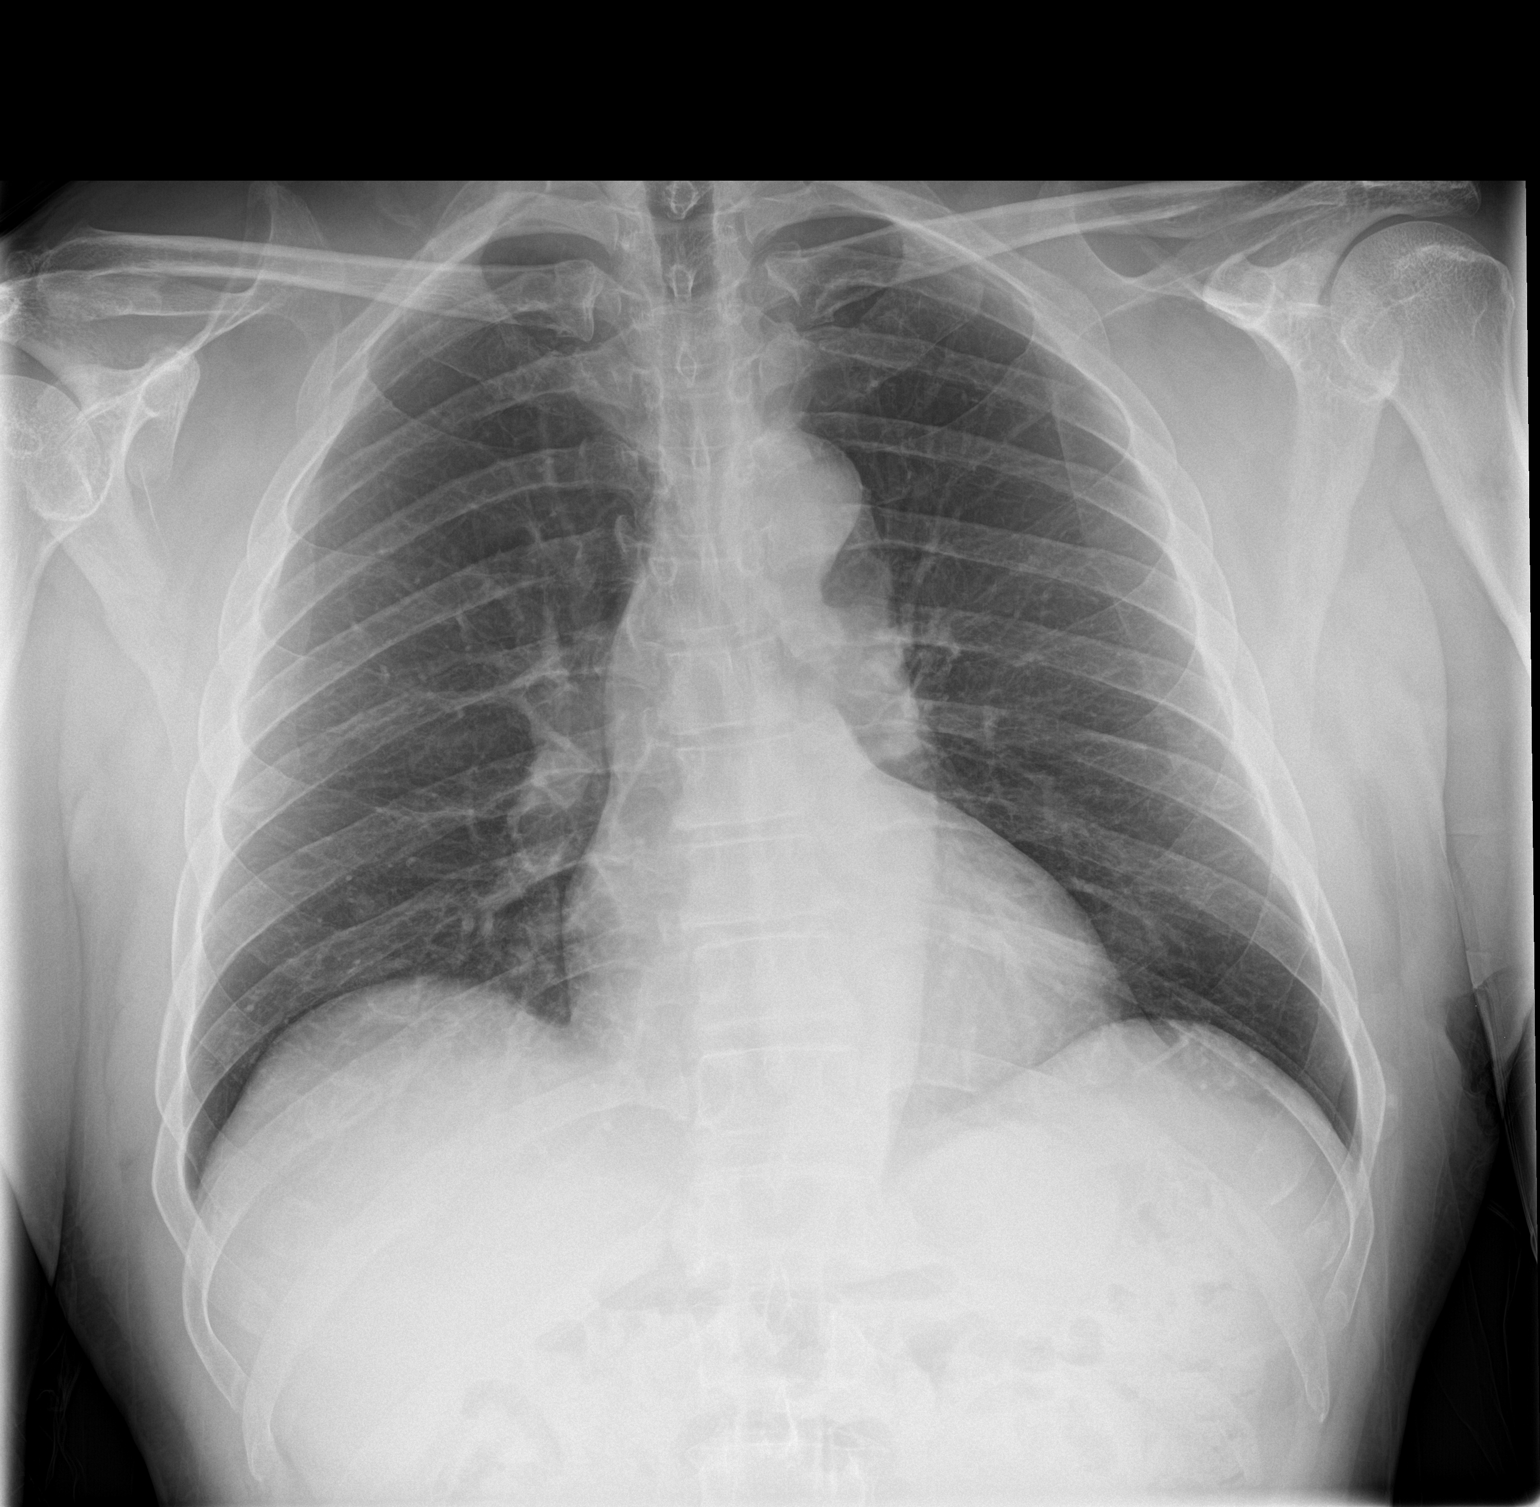

[chest lat]
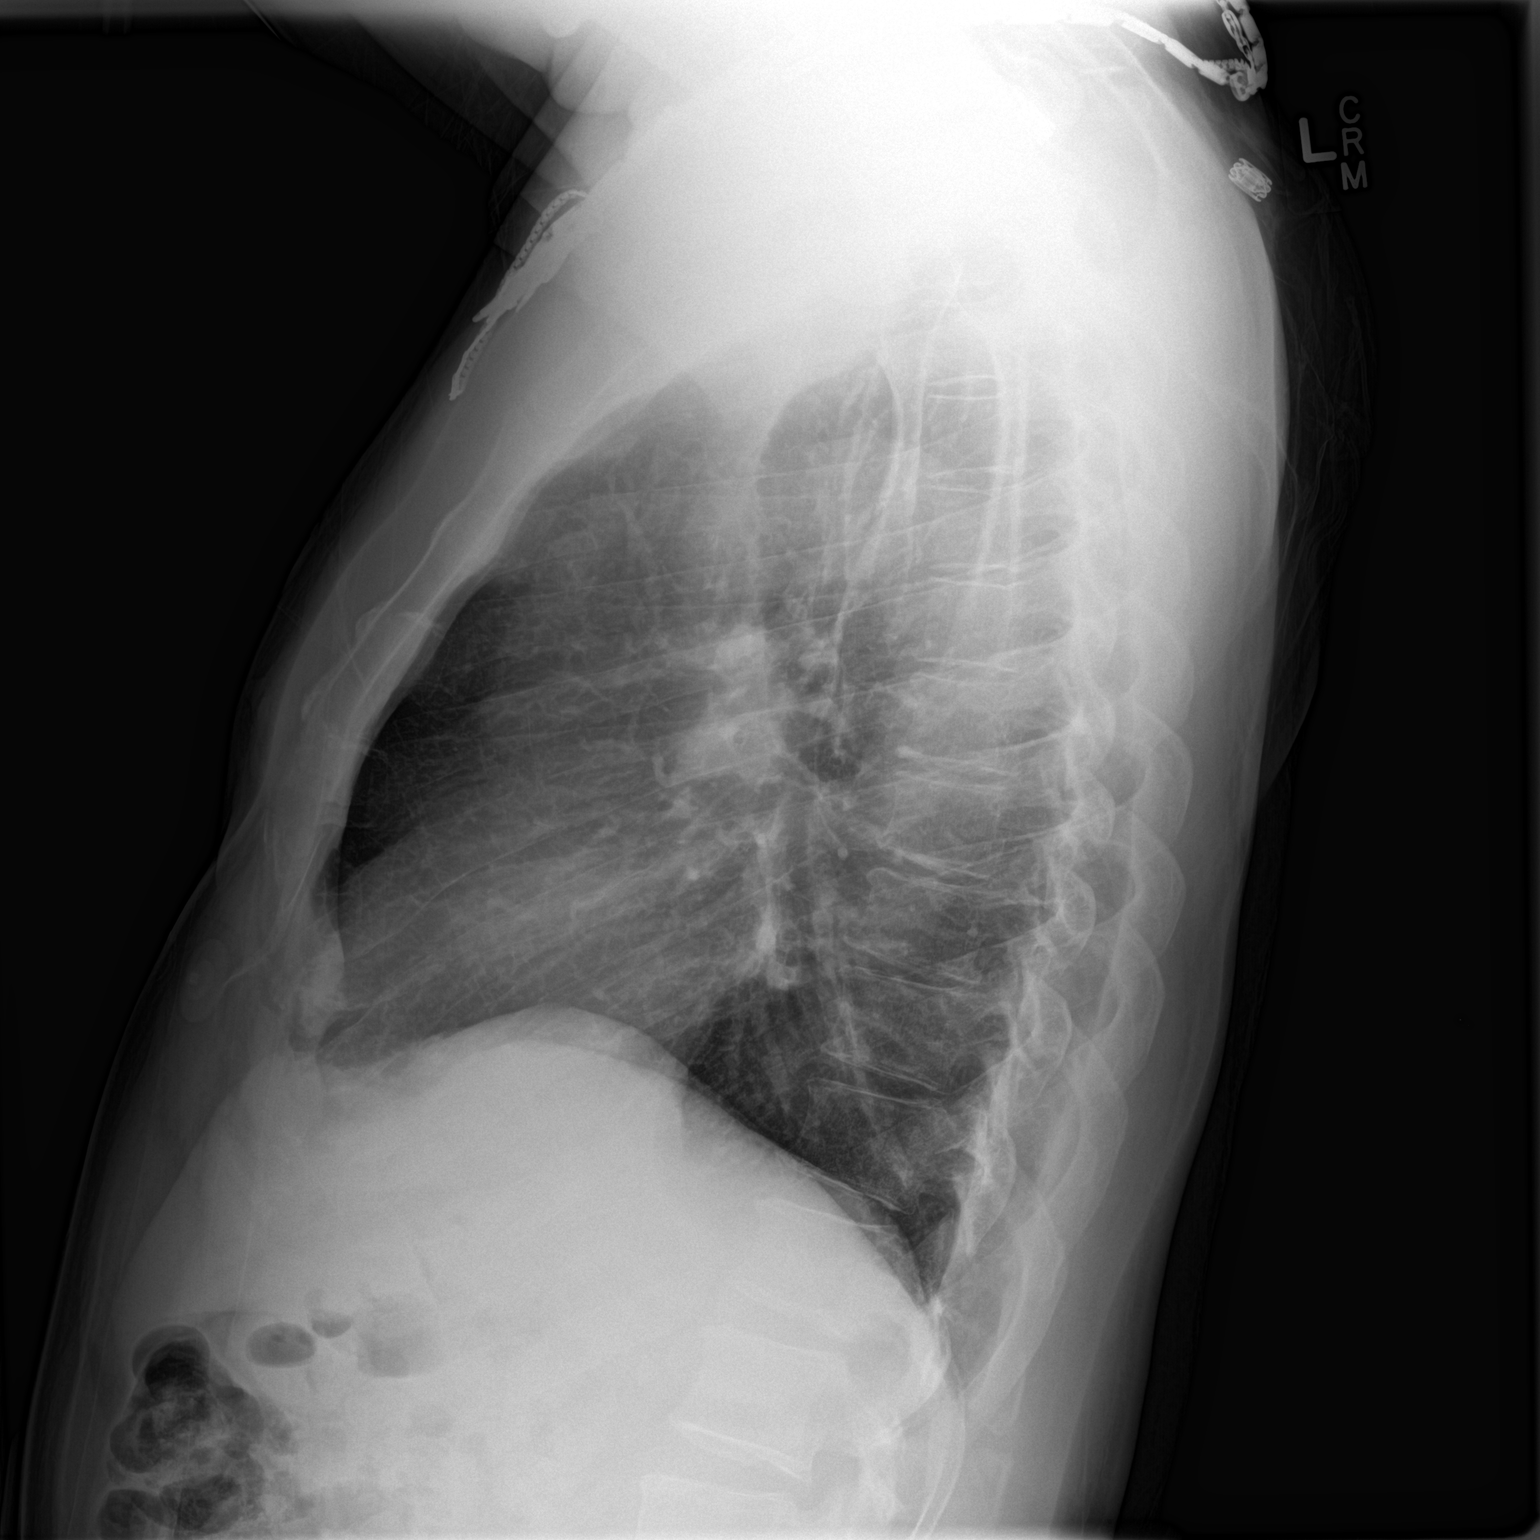

[2 of 2 positions shown; findings below may reference images not displayed]

FINDINGS: Stable cardiomediastinal silhouette with normal heart size. No
pneumothorax. No pleural effusion. Clear lungs, with no focal lung
consolidation and no pulmonary edema.
IMPRESSION: No active disease in the chest.

## 2016-11-13 ENCOUNTER — Encounter: Payer: Self-pay | Admitting: Family Medicine

## 2016-12-26 ENCOUNTER — Emergency Department (HOSPITAL_BASED_OUTPATIENT_CLINIC_OR_DEPARTMENT_OTHER)
Admission: EM | Admit: 2016-12-26 | Discharge: 2016-12-26 | Discharge status: Against Medical Advice | Payer: Self-pay | Attending: Emergency Medicine | Admitting: Emergency Medicine

## 2016-12-26 ENCOUNTER — Encounter (HOSPITAL_BASED_OUTPATIENT_CLINIC_OR_DEPARTMENT_OTHER): Payer: Self-pay | Admitting: *Deleted

## 2016-12-26 DIAGNOSIS — R0789 Other chest pain: Secondary | ICD-10-CM | POA: Insufficient documentation

## 2016-12-26 DIAGNOSIS — Z5321 Procedure and treatment not carried out due to patient leaving prior to being seen by health care provider: Secondary | ICD-10-CM | POA: Insufficient documentation

## 2016-12-26 NOTE — ED Notes (Signed)
Ambulatory to BR, gait steady  

## 2016-12-26 NOTE — ED Notes (Signed)
States," I have to leave or I will miss my ride" ED MD informed of wanting to leave and earlier BP.

## 2016-12-26 NOTE — ED Notes (Signed)
States," I did not take my BP medicine today because I could not find it this am" Denies SOB, chest pain or H/A. Calm, pleasant

## 2016-12-26 NOTE — ED Triage Notes (Addendum)
Pain in his left upper posterior leg pain x 3 weeks. He has been trying OTC pain medications and patches. States his BP may be elevated due to running out of medications.

## 2017-01-26 ENCOUNTER — Emergency Department (HOSPITAL_BASED_OUTPATIENT_CLINIC_OR_DEPARTMENT_OTHER)
Admission: EM | Admit: 2017-01-26 | Discharge: 2017-01-26 | Disposition: A | Discharge status: Home / Self Care | Payer: Self-pay | Attending: Emergency Medicine | Admitting: Emergency Medicine

## 2017-01-26 ENCOUNTER — Encounter (HOSPITAL_BASED_OUTPATIENT_CLINIC_OR_DEPARTMENT_OTHER): Payer: Self-pay | Admitting: *Deleted

## 2017-01-26 DIAGNOSIS — I160 Hypertensive urgency: Secondary | ICD-10-CM | POA: Insufficient documentation

## 2017-01-26 DIAGNOSIS — N289 Disorder of kidney and ureter, unspecified: Secondary | ICD-10-CM | POA: Insufficient documentation

## 2017-01-26 DIAGNOSIS — Z7982 Long term (current) use of aspirin: Secondary | ICD-10-CM | POA: Insufficient documentation

## 2017-01-26 DIAGNOSIS — I129 Hypertensive chronic kidney disease with stage 1 through stage 4 chronic kidney disease, or unspecified chronic kidney disease: Secondary | ICD-10-CM | POA: Insufficient documentation

## 2017-01-26 DIAGNOSIS — N182 Chronic kidney disease, stage 2 (mild): Secondary | ICD-10-CM | POA: Insufficient documentation

## 2017-01-26 DIAGNOSIS — Z79899 Other long term (current) drug therapy: Secondary | ICD-10-CM | POA: Insufficient documentation

## 2017-01-26 LAB — URINALYSIS, ROUTINE W REFLEX MICROSCOPIC
Bilirubin Urine: NEGATIVE
Glucose, UA: NEGATIVE mg/dL
Hgb urine dipstick: NEGATIVE
Ketones, ur: NEGATIVE mg/dL
Leukocytes, UA: NEGATIVE
Nitrite: NEGATIVE
Protein, ur: 100 mg/dL — AB
Specific Gravity, Urine: 1.018 (ref 1.005–1.030)
pH: 6.5 (ref 5.0–8.0)

## 2017-01-26 LAB — COMPREHENSIVE METABOLIC PANEL
ALT: 32 U/L (ref 17–63)
AST: 36 U/L (ref 15–41)
Albumin: 3.7 g/dL (ref 3.5–5.0)
Alkaline Phosphatase: 54 U/L (ref 38–126)
Anion gap: 10 (ref 5–15)
BUN: 24 mg/dL — ABNORMAL HIGH (ref 6–20)
CO2: 25 mmol/L (ref 22–32)
Calcium: 8.8 mg/dL — ABNORMAL LOW (ref 8.9–10.3)
Chloride: 101 mmol/L (ref 101–111)
Creatinine, Ser: 1.5 mg/dL — ABNORMAL HIGH (ref 0.61–1.24)
GFR calc Af Amer: 57 mL/min — ABNORMAL LOW (ref >60)
GFR calc non Af Amer: 49 mL/min — ABNORMAL LOW (ref >60)
Glucose, Bld: 99 mg/dL (ref 65–99)
Potassium: 3.3 mmol/L — ABNORMAL LOW (ref 3.5–5.1)
Sodium: 136 mmol/L (ref 135–145)
Total Bilirubin: 0.6 mg/dL (ref 0.3–1.2)
Total Protein: 7.7 g/dL (ref 6.5–8.1)

## 2017-01-26 LAB — CBC
HCT: 41.3 % (ref 39.0–52.0)
Hemoglobin: 14.4 g/dL (ref 13.0–17.0)
MCH: 32.3 pg (ref 26.0–34.0)
MCHC: 34.9 g/dL (ref 30.0–36.0)
MCV: 92.6 fL (ref 78.0–100.0)
Platelets: 196 10*3/uL (ref 150–400)
RBC: 4.46 MIL/uL (ref 4.22–5.81)
RDW: 13 % (ref 11.5–15.5)
WBC: 5.6 10*3/uL (ref 4.0–10.5)

## 2017-01-26 LAB — URINALYSIS, MICROSCOPIC (REFLEX): RBC / HPF: NONE SEEN RBC/hpf (ref 0–5)

## 2017-01-26 MED ORDER — LISINOPRIL 10 MG PO TABS
20.0000 mg | ORAL_TABLET | Freq: Once | ORAL | Status: AC
  Administered 2017-01-26: 20 mg via ORAL
  Filled 2017-01-26: qty 2

## 2017-01-26 MED ORDER — AMLODIPINE BESYLATE 5 MG PO TABS
10.0000 mg | ORAL_TABLET | Freq: Once | ORAL | Status: AC
  Administered 2017-01-26: 10 mg via ORAL
  Filled 2017-01-26: qty 2

## 2017-01-26 MED ORDER — HYDRALAZINE HCL 20 MG/ML IJ SOLN
10.0000 mg | Freq: Once | INTRAMUSCULAR | Status: AC
  Administered 2017-01-26: 10 mg via INTRAVENOUS
  Filled 2017-01-26: qty 1

## 2017-01-26 MED ORDER — SPIRONOLACTONE 25 MG PO TABS
25.0000 mg | ORAL_TABLET | Freq: Every day | ORAL | Status: DC
  Filled 2017-01-26: qty 1

## 2017-01-26 MED ORDER — POTASSIUM CHLORIDE CRYS ER 20 MEQ PO TBCR
40.0000 meq | EXTENDED_RELEASE_TABLET | Freq: Once | ORAL | Status: AC
  Administered 2017-01-26: 40 meq via ORAL
  Filled 2017-01-26: qty 2

## 2017-01-26 NOTE — Discharge Instructions (Signed)
Please read instructions below. It is important that you fill your blood pressure prescriptions and begin taking them tomorrow as prescribed by your primary care provider. Schedule an appointment with them to follow up on your visit today. Return to the ER for vision changes, worsening headache, decrease in urine output, chest pain, or new or concerning symptoms.

## 2017-01-26 NOTE — ED Notes (Signed)
Pt on cardiac monitor and auto VS. Drinking juice.

## 2017-01-26 NOTE — ED Triage Notes (Signed)
Patient states he has a four day history of headache.  States today he took his blood pressure at the [facility] and it was 218/123, states he has been out of his Amlodipine, Spirolactone, and Lisinopril for the last week.  Only has prescription of Appressoline.  Patient is seen by [facility].
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
## 2017-01-26 NOTE — ED Notes (Signed)
Urinal given

## 2017-01-27 MED ORDER — SPIRONOLACTONE 25 MG PO TABS: 25.0000 mg | ORAL_TABLET | Freq: Every day | ORAL | 0 refills | Status: DC

## 2017-01-27 MED ORDER — LISINOPRIL 20 MG PO TABS: 20.0000 mg | ORAL_TABLET | Freq: Every day | ORAL | 0 refills | Status: DC

## 2017-01-27 MED ORDER — AMLODIPINE BESYLATE 10 MG PO TABS: 10.0000 mg | ORAL_TABLET | Freq: Every day | ORAL | 0 refills | Status: DC

## 2017-01-27 MED FILL — LISINOPRIL 20 MG TABLET: 20 | 30 days supply | Qty: 30 | Fill #0

## 2017-01-27 MED FILL — SPIRONOLACTONE 25 MG TABLET: 25 | 30 days supply | Qty: 30 | Fill #0

## 2017-01-27 MED FILL — AMLODIPINE BESYLATE 10 MG T: 10 | 30 days supply | Qty: 30 | Fill #0

## 2017-02-27 MED FILL — AMLODIPINE BESYLATE 10 MG T: 10 | 30 days supply | Qty: 30 | Fill #2

## 2017-02-27 MED FILL — hydrALAZINE HCL 50 MG TABS: 50 | 30 days supply | Qty: 90 | Fill #2

## 2017-02-27 MED FILL — LISINOPRIL 20 MG TAB: 20 | 30 days supply | Qty: 30 | Fill #2

## 2017-03-21 ENCOUNTER — Emergency Department (HOSPITAL_COMMUNITY): Payer: Self-pay

## 2017-03-21 ENCOUNTER — Emergency Department (HOSPITAL_COMMUNITY)
Admission: EM | Admit: 2017-03-21 | Discharge: 2017-03-21 | Disposition: A | Discharge status: Home / Self Care | Payer: Self-pay | Attending: Emergency Medicine | Admitting: Emergency Medicine

## 2017-03-21 DIAGNOSIS — Z79899 Other long term (current) drug therapy: Secondary | ICD-10-CM | POA: Insufficient documentation

## 2017-03-21 DIAGNOSIS — M25512 Pain in left shoulder: Secondary | ICD-10-CM | POA: Insufficient documentation

## 2017-03-21 DIAGNOSIS — Z7982 Long term (current) use of aspirin: Secondary | ICD-10-CM | POA: Insufficient documentation

## 2017-03-21 DIAGNOSIS — N182 Chronic kidney disease, stage 2 (mild): Secondary | ICD-10-CM | POA: Insufficient documentation

## 2017-03-21 DIAGNOSIS — I129 Hypertensive chronic kidney disease with stage 1 through stage 4 chronic kidney disease, or unspecified chronic kidney disease: Secondary | ICD-10-CM | POA: Insufficient documentation

## 2017-03-21 LAB — CBC
HCT: 42.7 % (ref 39.0–52.0)
Hemoglobin: 14.4 g/dL (ref 13.0–17.0)
MCH: 31.2 pg (ref 26.0–34.0)
MCHC: 33.7 g/dL (ref 30.0–36.0)
MCV: 92.4 fL (ref 78.0–100.0)
Platelets: 224 10*3/uL (ref 150–400)
RBC: 4.62 MIL/uL (ref 4.22–5.81)
RDW: 12.9 % (ref 11.5–15.5)
WBC: 5.3 10*3/uL (ref 4.0–10.5)

## 2017-03-21 LAB — BASIC METABOLIC PANEL
Anion gap: 9 (ref 5–15)
BUN: 24 mg/dL — ABNORMAL HIGH (ref 6–20)
CO2: 22 mmol/L (ref 22–32)
Calcium: 9.2 mg/dL (ref 8.9–10.3)
Chloride: 104 mmol/L (ref 101–111)
Creatinine, Ser: 1.71 mg/dL — ABNORMAL HIGH (ref 0.61–1.24)
GFR calc Af Amer: 48 mL/min — ABNORMAL LOW (ref >60)
GFR calc non Af Amer: 42 mL/min — ABNORMAL LOW (ref >60)
Glucose, Bld: 133 mg/dL — ABNORMAL HIGH (ref 65–99)
Potassium: 3.5 mmol/L (ref 3.5–5.1)
Sodium: 135 mmol/L (ref 135–145)

## 2017-03-21 LAB — I-STAT TROPONIN, ED: Troponin i, poc: 0.02 ng/mL (ref 0.00–0.08)

## 2017-03-21 MED ORDER — KETOROLAC TROMETHAMINE 60 MG/2ML IM SOLN
60.0000 mg | Freq: Once | INTRAMUSCULAR | Status: AC
  Administered 2017-03-21: 60 mg via INTRAMUSCULAR
  Filled 2017-03-21: qty 2

## 2017-03-21 MED ORDER — BACLOFEN 10 MG PO TABS
10.0000 mg | ORAL_TABLET | Freq: Once | ORAL | Status: AC
  Administered 2017-03-21: 10 mg via ORAL
  Filled 2017-03-21: qty 1

## 2017-03-21 MED ORDER — ORPHENADRINE CITRATE 30 MG/ML IJ SOLN: 60.0000 mg | Freq: Two times a day (BID) | INTRAMUSCULAR | Status: DC

## 2017-03-21 NOTE — ED Notes (Signed)
Pt went to cafeteria 

## 2017-03-21 NOTE — ED Notes (Signed)
Pt verbalized understanding discharge instructions and denies any further needs or questions at this time. VS stable, ambulatory and steady gait.   
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
## 2017-03-21 NOTE — ED Triage Notes (Signed)
Pt is here with left shoulder pain for 7 days that aches down to his elbow and reports some chest and back pain

## 2017-07-10 ENCOUNTER — Encounter (HOSPITAL_COMMUNITY): Payer: Self-pay | Admitting: Emergency Medicine

## 2017-07-10 ENCOUNTER — Other Ambulatory Visit: Payer: Self-pay

## 2017-07-10 ENCOUNTER — Emergency Department (HOSPITAL_COMMUNITY)
Admission: EM | Admit: 2017-07-10 | Discharge: 2017-07-10 | Disposition: A | Discharge status: Home / Self Care | Payer: Self-pay | Attending: Emergency Medicine | Admitting: Emergency Medicine

## 2017-07-10 ENCOUNTER — Emergency Department (HOSPITAL_COMMUNITY): Payer: Self-pay

## 2017-07-10 DIAGNOSIS — I10 Essential (primary) hypertension: Secondary | ICD-10-CM

## 2017-07-10 DIAGNOSIS — R51 Headache: Secondary | ICD-10-CM

## 2017-07-10 DIAGNOSIS — N182 Chronic kidney disease, stage 2 (mild): Secondary | ICD-10-CM | POA: Insufficient documentation

## 2017-07-10 DIAGNOSIS — Z7982 Long term (current) use of aspirin: Secondary | ICD-10-CM | POA: Insufficient documentation

## 2017-07-10 DIAGNOSIS — R519 Headache, unspecified: Secondary | ICD-10-CM

## 2017-07-10 DIAGNOSIS — I129 Hypertensive chronic kidney disease with stage 1 through stage 4 chronic kidney disease, or unspecified chronic kidney disease: Secondary | ICD-10-CM | POA: Insufficient documentation

## 2017-07-10 LAB — I-STAT CHEM 8, ED
BUN: 26 mg/dL — ABNORMAL HIGH (ref 6–20)
Calcium, Ion: 1.13 mmol/L — ABNORMAL LOW (ref 1.15–1.40)
Chloride: 100 mmol/L — ABNORMAL LOW (ref 101–111)
Creatinine, Ser: 1.7 mg/dL — ABNORMAL HIGH (ref 0.61–1.24)
Glucose, Bld: 95 mg/dL (ref 65–99)
HCT: 50 % (ref 39.0–52.0)
Hemoglobin: 17 g/dL (ref 13.0–17.0)
Potassium: 3.2 mmol/L — ABNORMAL LOW (ref 3.5–5.1)
Sodium: 141 mmol/L (ref 135–145)
TCO2: 27 mmol/L (ref 22–32)

## 2017-07-10 MED ORDER — AMLODIPINE BESYLATE 10 MG PO TABS: 10.0000 mg | ORAL_TABLET | Freq: Every day | ORAL | 0 refills | Status: DC

## 2017-07-10 MED ORDER — HYDRALAZINE HCL 50 MG PO TABS
50.0000 mg | ORAL_TABLET | Freq: Once | ORAL | Status: AC
  Administered 2017-07-10: 50 mg via ORAL
  Filled 2017-07-10: qty 1

## 2017-07-10 MED ORDER — AMLODIPINE BESYLATE 5 MG PO TABS
10.0000 mg | ORAL_TABLET | Freq: Once | ORAL | Status: AC
  Administered 2017-07-10: 10 mg via ORAL
  Filled 2017-07-10: qty 2

## 2017-07-10 MED ORDER — LISINOPRIL 20 MG PO TABS
20.0000 mg | ORAL_TABLET | Freq: Once | ORAL | Status: AC
  Administered 2017-07-10: 20 mg via ORAL
  Filled 2017-07-10: qty 1

## 2017-07-10 MED ORDER — LISINOPRIL 20 MG PO TABS: 40.0000 mg | ORAL_TABLET | Freq: Every day | ORAL | 0 refills | Status: DC

## 2017-07-10 MED ORDER — HYDROCODONE-ACETAMINOPHEN 5-325 MG PO TABS
2.0000 | ORAL_TABLET | Freq: Once | ORAL | Status: AC
  Administered 2017-07-10: 2 via ORAL
  Filled 2017-07-10: qty 2

## 2017-07-10 MED ORDER — POTASSIUM CHLORIDE CRYS ER 20 MEQ PO TBCR
40.0000 meq | EXTENDED_RELEASE_TABLET | Freq: Once | ORAL | Status: AC
Start: 2017-07-10 — End: 2017-07-10
  Administered 2017-07-10: 40 meq via ORAL
  Filled 2017-07-10: qty 2

## 2017-07-10 MED ORDER — SPIRONOLACTONE 25 MG PO TABS: 25.0000 mg | ORAL_TABLET | Freq: Every day | ORAL | 0 refills | Status: DC

## 2017-07-10 MED ORDER — SPIRONOLACTONE 25 MG PO TABS
25.0000 mg | ORAL_TABLET | Freq: Every day | ORAL | Status: DC
  Administered 2017-07-10: 25 mg via ORAL
  Filled 2017-07-10: qty 1

## 2017-07-10 MED ORDER — HYDRALAZINE HCL 50 MG PO TABS: 10.0000 mg | ORAL_TABLET | Freq: Three times a day (TID) | ORAL | 0 refills | Status: DC

## 2017-07-10 NOTE — Discharge Instructions (Signed)
Follow up with her primary care physician as soon as possible for medication refills.
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
## 2017-07-10 NOTE — ED Triage Notes (Signed)
Pt complaint of frontal headache with blurred vision onset 3 days ago; out of blood pressure medication for 3 days. Pt denies weakness or numbness.

## 2017-07-14 MED FILL — ?HYDRALAZINE 50MG TABLET: 50 | 30 days supply | Qty: 45 | Fill #0

## 2017-07-14 MED FILL — LISINOPRIL 20 MG TAB: 20 | 15 days supply | Qty: 30 | Fill #0

## 2017-07-14 MED FILL — AMLODIPINE BESYLATE 10 MG T: 10 | 30 days supply | Qty: 30 | Fill #0

## 2017-07-14 MED FILL — SPIRONOLACTONE 25 MG TABS: 25 | 30 days supply | Qty: 30 | Fill #0

## 2017-07-22 ENCOUNTER — Encounter: Payer: Self-pay | Admitting: Nurse Practitioner

## 2017-07-22 ENCOUNTER — Ambulatory Visit: Payer: Self-pay | Attending: Nurse Practitioner | Admitting: Nurse Practitioner

## 2017-07-22 VITALS — BP 161/92 | HR 66 | Temp 98.4°F | Resp 12 | Ht 66.0 in | Wt 182.6 lb

## 2017-07-22 DIAGNOSIS — R51 Headache: Secondary | ICD-10-CM | POA: Insufficient documentation

## 2017-07-22 DIAGNOSIS — Z7982 Long term (current) use of aspirin: Secondary | ICD-10-CM | POA: Insufficient documentation

## 2017-07-22 DIAGNOSIS — K219 Gastro-esophageal reflux disease without esophagitis: Secondary | ICD-10-CM | POA: Insufficient documentation

## 2017-07-22 DIAGNOSIS — I252 Old myocardial infarction: Secondary | ICD-10-CM | POA: Insufficient documentation

## 2017-07-22 DIAGNOSIS — N182 Chronic kidney disease, stage 2 (mild): Secondary | ICD-10-CM | POA: Insufficient documentation

## 2017-07-22 DIAGNOSIS — R001 Bradycardia, unspecified: Secondary | ICD-10-CM | POA: Insufficient documentation

## 2017-07-22 DIAGNOSIS — I10 Essential (primary) hypertension: Secondary | ICD-10-CM

## 2017-07-22 DIAGNOSIS — I129 Hypertensive chronic kidney disease with stage 1 through stage 4 chronic kidney disease, or unspecified chronic kidney disease: Secondary | ICD-10-CM | POA: Insufficient documentation

## 2017-07-22 DIAGNOSIS — R05 Cough: Secondary | ICD-10-CM | POA: Insufficient documentation

## 2017-07-22 DIAGNOSIS — R0602 Shortness of breath: Secondary | ICD-10-CM | POA: Insufficient documentation

## 2017-07-22 DIAGNOSIS — Z1211 Encounter for screening for malignant neoplasm of colon: Secondary | ICD-10-CM

## 2017-07-22 DIAGNOSIS — Z0189 Encounter for other specified special examinations: Secondary | ICD-10-CM | POA: Insufficient documentation

## 2017-07-22 DIAGNOSIS — M25511 Pain in right shoulder: Secondary | ICD-10-CM | POA: Insufficient documentation

## 2017-07-22 DIAGNOSIS — Z79899 Other long term (current) drug therapy: Secondary | ICD-10-CM | POA: Insufficient documentation

## 2017-07-22 MED ORDER — DICLOFENAC SODIUM 1 % TD GEL: 4.0000 g | Freq: Four times a day (QID) | TRANSDERMAL | 0 refills | Status: DC

## 2017-07-22 MED FILL — DICLOFENAC SODIUM 1% GEL: 1 | 6 days supply | Qty: 100 | Fill #0
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
## 2017-07-22 NOTE — Patient Instructions (Addendum)
Chronic Kidney Disease, Adult Chronic kidney disease (CKD) happens when the kidneys are damaged during a time of 3 or more months. The kidneys are two organs that do many important jobs in the body. These jobs include:  Removing wastes and extra fluids from the blood.  Making hormones that maintain the amount of fluid in your tissues and blood vessels.  Making sure that the body has the right amount of fluids and chemicals.  Most of the time, this condition does not go away, but it can usually be controlled. Steps must be taken to slow down the kidney damage or stop it from getting worse. Otherwise, the kidneys may stop working. Follow these instructions at home:  Follow your diet as told by your doctor. You may need to avoid alcohol, salty foods (sodium), and foods that are high in potassium, calcium, and protein.  Take over-the-counter and prescription medicines only as told by your doctor. Do not take any new medicines unless your doctor says you can do that. These include vitamins and minerals. ? Medicines and nutritional supplements can make kidney damage worse. ? Your doctor may need to change how much medicine you take.  Do not use any tobacco products. These include cigarettes, chewing tobacco, and e-cigarettes. If you need help quitting, ask your doctor.  Keep all follow-up visits as told by your doctor. This is important.  Check your blood pressure. Tell your doctor if there are changes to your blood pressure.  Get to a healthy weight. Stay at that weight. If you need help with this, ask your doctor.  Start or continue an exercise plan. Try to exercise at least 30 minutes a day, 5 days a week.  Stay up-to-date with your shots (immunizations) as told by your doctor. Contact a doctor if:  Your symptoms get worse.  You have new symptoms. Get help right away if:  You have symptoms of end-stage kidney disease. These include: ? Headaches. ? Skin that is darker or lighter  than normal. ? Numbness in your hands or feet. ? Easy bruising. ? Having hiccups often. ? Chest pain. ? Shortness of breath. ? Stopping of menstrual periods in women.  You have a fever.  You are making very little pee (urine).  You have pain or bleeding when you pee (urinate). This information is not intended to replace advice given to you by your health care provider. Make sure you discuss any questions you have with your health care provider. Document Released: 09/11/2009 Document Revised: 11/23/2015 Document Reviewed: 02/14/2012 Elsevier Interactive Patient Education  2017 [employer]vier Inc.  

## 2017-07-23 LAB — CBC
Hematocrit: 46.1 % (ref 37.5–51.0)
Hemoglobin: 15.9 g/dL (ref 13.0–17.7)
MCH: 31.8 pg (ref 26.6–33.0)
MCHC: 34.5 g/dL (ref 31.5–35.7)
MCV: 92 fL (ref 79–97)
Platelets: 242 10*3/uL (ref 150–379)
RBC: 5 x10E6/uL (ref 4.14–5.80)
RDW: 12.7 % (ref 12.3–15.4)
WBC: 6.7 10*3/uL (ref 3.4–10.8)

## 2017-07-23 LAB — CMP14+EGFR
ALT: 31 IU/L (ref 0–44)
AST: 29 IU/L (ref 0–40)
Albumin/Globulin Ratio: 1.3 (ref 1.2–2.2)
Albumin: 4.2 g/dL (ref 3.6–4.8)
Alkaline Phosphatase: 63 IU/L (ref 39–117)
BUN/Creatinine Ratio: 21 (ref 10–24)
BUN: 27 mg/dL (ref 8–27)
Bilirubin Total: <0.2 mg/dL (ref 0.0–1.2)
CO2: 24 mmol/L (ref 20–29)
Calcium: 9.6 mg/dL (ref 8.6–10.2)
Chloride: 101 mmol/L (ref 96–106)
Creatinine, Ser: 1.3 mg/dL — ABNORMAL HIGH (ref 0.76–1.27)
GFR calc Af Amer: 69 mL/min/{1.73_m2} (ref >59)
GFR calc non Af Amer: 59 mL/min/{1.73_m2} — ABNORMAL LOW (ref >59)
Globulin, Total: 3.3 g/dL (ref 1.5–4.5)
Glucose: 86 mg/dL (ref 65–99)
Potassium: 4.6 mmol/L (ref 3.5–5.2)
Sodium: 142 mmol/L (ref 134–144)
Total Protein: 7.5 g/dL (ref 6.0–8.5)

## 2017-07-23 LAB — LIPID PANEL
Chol/HDL Ratio: 3.9 ratio (ref 0.0–5.0)
Cholesterol, Total: 184 mg/dL (ref 100–199)
HDL: 47 mg/dL (ref >39)
LDL Calculated: 61 mg/dL (ref 0–99)
Triglycerides: 380 mg/dL — ABNORMAL HIGH (ref 0–149)
VLDL Cholesterol Cal: 76 mg/dL — ABNORMAL HIGH (ref 5–40)

## 2017-07-24 ENCOUNTER — Encounter: Payer: Self-pay | Admitting: Nurse Practitioner

## 2017-07-25 ENCOUNTER — Telehealth: Payer: Self-pay

## 2017-07-25 NOTE — Telephone Encounter (Signed)
CMA called patient to informed on lab result.  Patient did not answer.  Left a voicemail and call back for patient.  Communication letter will be send out.

## 2017-07-25 NOTE — Telephone Encounter (Signed)
-----   Message from [name], NP sent at 07/24/2017 10:19 PM EST ----- Please call patient: Tests show increased cholesterol/lipid levels.  Patient should work on a low fat, heart healthy diet and participate in regular aerobic exercise program to control as well by working out at least 150 minutes per week. No fried foods. No junk foods, sodas, sugary drinks, unhealthy snacking, or smoking. Kidney function has improved. Continue to restrict sodium/salt and protein in your diet.

## 2017-07-25 NOTE — Telephone Encounter (Signed)
CMA attempt to call patient regarding lab results.  Patient did not answer.  Left a call back number and VM.

## 2017-08-01 ENCOUNTER — Telehealth: Payer: Self-pay | Admitting: Family Medicine

## 2017-08-01 NOTE — Telephone Encounter (Signed)
CMA called patient to informed on lab result and PCP advising.  Patient understood.

## 2017-08-01 NOTE — Telephone Encounter (Signed)
Pt called back to receive results. His phone number has been updated. Please follow up

## 2017-08-19 ENCOUNTER — Encounter: Payer: Self-pay | Admitting: Nurse Practitioner

## 2017-08-19 ENCOUNTER — Ambulatory Visit: Payer: Self-pay | Attending: Nurse Practitioner | Admitting: Nurse Practitioner

## 2017-08-19 VITALS — BP 154/95 | HR 60 | Temp 98.6°F | Ht 66.0 in | Wt 176.6 lb

## 2017-08-19 DIAGNOSIS — R252 Cramp and spasm: Secondary | ICD-10-CM

## 2017-08-19 DIAGNOSIS — Z79899 Other long term (current) drug therapy: Secondary | ICD-10-CM | POA: Insufficient documentation

## 2017-08-19 DIAGNOSIS — Z7982 Long term (current) use of aspirin: Secondary | ICD-10-CM | POA: Insufficient documentation

## 2017-08-19 DIAGNOSIS — I10 Essential (primary) hypertension: Secondary | ICD-10-CM | POA: Insufficient documentation

## 2017-08-19 DIAGNOSIS — Z760 Encounter for issue of repeat prescription: Secondary | ICD-10-CM | POA: Insufficient documentation

## 2017-08-19 DIAGNOSIS — G4700 Insomnia, unspecified: Secondary | ICD-10-CM | POA: Insufficient documentation

## 2017-08-19 MED ORDER — LISINOPRIL 20 MG PO TABS: 40.0000 mg | ORAL_TABLET | Freq: Every day | ORAL | 0 refills | Status: DC

## 2017-08-19 MED ORDER — LISINOPRIL 40 MG PO TABS: 40.0000 mg | ORAL_TABLET | Freq: Every day | ORAL | 1 refills | Status: DC

## 2017-08-19 MED ORDER — HYDRALAZINE HCL 50 MG PO TABS
50.0000 mg | ORAL_TABLET | Freq: Three times a day (TID) | ORAL | 0 refills | Status: DC
Start: 2017-08-19 — End: 2017-10-31

## 2017-08-19 MED ORDER — AMLODIPINE BESYLATE 10 MG PO TABS: 10.0000 mg | ORAL_TABLET | Freq: Every day | ORAL | 0 refills | Status: DC

## 2017-08-19 MED ORDER — SPIRONOLACTONE 25 MG PO TABS: 25.0000 mg | ORAL_TABLET | Freq: Every day | ORAL | 0 refills | Status: DC

## 2017-08-19 MED FILL — ?HYDRALAZINE 50MG TABLET: 50 | 30 days supply | Qty: 90 | Fill #0

## 2017-08-19 MED FILL — AMLODIPINE BESYLATE 10 MG T: 10 | 30 days supply | Qty: 30 | Fill #0

## 2017-08-19 MED FILL — ?SPIR0N0LACTONE 25 MG TABLE: 25 | 30 days supply | Qty: 30 | Fill #0

## 2017-08-19 MED FILL — LISINOPRIL 20 MG TAB: 20 | 30 days supply | Qty: 60 | Fill #0
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
## 2017-08-19 NOTE — Patient Instructions (Addendum)
Hypertension Hypertension is another name for high blood pressure. High blood pressure forces your heart to work harder to pump blood. This can cause problems over time. There are two numbers in a blood pressure reading. There is a top number (systolic) over a bottom number (diastolic). It is best to have a blood pressure below 120/80. Healthy choices can help lower your blood pressure. You may need medicine to help lower your blood pressure if:  Your blood pressure cannot be lowered with healthy choices.  Your blood pressure is higher than 130/80.  Follow these instructions at home: Eating and drinking  If directed, follow the DASH eating plan. This diet includes: ? Filling half of your plate at each meal with fruits and vegetables. ? Filling one quarter of your plate at each meal with whole grains. Whole grains include whole wheat pasta, brown rice, and whole grain bread. ? Eating or drinking low-fat dairy products, such as skim milk or low-fat yogurt. ? Filling one quarter of your plate at each meal with low-fat (lean) proteins. Low-fat proteins include fish, skinless chicken, eggs, beans, and tofu. ? Avoiding fatty meat, cured and processed meat, or chicken with skin. ? Avoiding premade or processed food.  Eat less than 1,500 mg of salt (sodium) a day.  Limit alcohol use to no more than 1 drink a day for nonpregnant women and 2 drinks a day for men. One drink equals 12 oz of beer, 5 oz of wine, or 1 oz of hard liquor. Lifestyle  Work with your doctor to stay at a healthy weight or to lose weight. Ask your doctor what the best weight is for you.  Get at least 30 minutes of exercise that causes your heart to beat faster (aerobic exercise) most days of the week. This may include walking, swimming, or biking.  Get at least 30 minutes of exercise that strengthens your muscles (resistance exercise) at least 3 days a week. This may include lifting weights or pilates.  Do not use any  products that contain nicotine or tobacco. This includes cigarettes and e-cigarettes. If you need help quitting, ask your doctor.  Check your blood pressure at home as told by your doctor.  Keep all follow-up visits as told by your doctor. This is important. Medicines  Take over-the-counter and prescription medicines only as told by your doctor. Follow directions carefully.  Do not skip doses of blood pressure medicine. The medicine does not work as well if you skip doses. Skipping doses also puts you at risk for problems.  Ask your doctor about side effects or reactions to medicines that you should watch for. Contact a doctor if:  You think you are having a reaction to the medicine you are taking.  You have headaches that keep coming back (recurring).  You feel dizzy.  You have swelling in your ankles.  You have trouble with your vision. Get help right away if:  You get a very bad headache.  You start to feel confused.  You feel weak or numb.  You feel faint.  You get very bad pain in your: ? Chest. ? Belly (abdomen).  You throw up (vomit) more than once.  You have trouble breathing. Summary  Hypertension is another name for high blood pressure.  Making healthy choices can help lower blood pressure. If your blood pressure cannot be controlled with healthy choices, you may need to take medicine. This information is not intended to replace advice given to you by your health care   provider. Make sure you discuss any questions you have with your health care provider. Document Released: 12/04/2007 Document Revised: 05/15/2016 Document Reviewed: 05/15/2016 Elsevier Interactive Patient Education  2018 [place]. Leg Cramps Leg cramps occur when a muscle or muscles tighten and you have no control over this tightening (involuntary muscle contraction). Muscle cramps can develop in any muscle, but the most common place is in the calf muscles of the leg. Those cramps can  occur during exercise or when you are at rest. Leg cramps are painful, and they may last for a few seconds to a few minutes. Cramps may return several times before they finally stop. Usually, leg cramps are not caused by a serious medical problem. In many cases, the cause is not known. Some common causes include:  Overexertion.  Overuse from repetitive motions, or doing the same thing over and over.  Remaining in a certain position for a long period of time.  Improper preparation, form, or technique while performing a sport or an activity.  Dehydration.  Injury.  Side effects of some medicines.  Abnormally low levels of the salts and ions in your blood (electrolytes), especially potassium and calcium. These levels could be low if you are taking water pills (diuretics) or if you are pregnant.  Follow these instructions at home: Watch your condition for any changes. Taking the following actions may help to lessen any discomfort that you are feeling:  Stay well-hydrated. Drink enough fluid to keep your urine clear or pale yellow.  Try massaging, stretching, and relaxing the affected muscle. Do this for several minutes at a time.  For tight or tense muscles, use a warm towel, heating pad, or hot shower water directed to the affected area.  If you are sore or have pain after a cramp, applying ice to the affected area may relieve discomfort. ? Put ice in a plastic bag. ? Place a towel between your skin and the bag. ? Leave the ice on for 20 minutes, 2-3 times per day.  Avoid strenuous exercise for several days if you have been having frequent leg cramps.  Make sure that your diet includes the essential minerals for your muscles to work normally.  Take medicines only as directed by your health care provider.  Contact a health care provider if:  Your leg cramps get more severe or more frequent, or they do not improve over time.  Your foot becomes cold, numb, or blue. This  information is not intended to replace advice given to you by your health care provider. Make sure you discuss any questions you have with your health care provider. Document Released: 07/25/2004 Document Revised: 11/23/2015 Document Reviewed: 05/25/2014 Elsevier Interactive Patient Education  [employer].

## 2017-08-21 LAB — B12 AND FOLATE PANEL
Folate: 14.8 ng/mL (ref >3.0)
Vitamin B-12: 504 pg/mL (ref 232–1245)

## 2017-08-21 LAB — MAGNESIUM: Magnesium: 2.1 mg/dL (ref 1.6–2.3)

## 2017-08-22 ENCOUNTER — Telehealth: Payer: Self-pay

## 2017-08-22 NOTE — Telephone Encounter (Signed)
-----   Message from [name], NP sent at 08/21/2017 12:02 AM EST ----- You have a normal B12 and folate level. I am still waiting on your magnesium levels

## 2017-08-22 NOTE — Telephone Encounter (Signed)
CMA attempt to call patient regarding results.  No answer and left a VM/Callback.  If patient call back please let him know:  You have a normal B12 and folate level. I am still waiting on your magnesium levels

## 2017-08-28 ENCOUNTER — Telehealth: Payer: Self-pay

## 2017-08-28 NOTE — Telephone Encounter (Signed)
-----   Message from [name], NP sent at 08/26/2017  8:40 AM EST ----- All of your labs are normal. There are no electrolyte or vitamin deficiencies that would be the cause of your symptoms.

## 2017-08-28 NOTE — Telephone Encounter (Signed)
CMA attempt to call patient regarding lab results and PCP advising.  No answer and left a voicemail.  If patient do call back please inform:  All of your labs are normal. There are no electrolyte or vitamin deficiencies that would be the cause of your symptoms.

## 2017-09-16 ENCOUNTER — Ambulatory Visit: Payer: Self-pay | Attending: Nurse Practitioner | Admitting: Nurse Practitioner

## 2017-09-16 ENCOUNTER — Encounter: Payer: Self-pay | Admitting: Nurse Practitioner

## 2017-09-16 VITALS — BP 130/88 | HR 64 | Temp 98.3°F | Ht 66.0 in | Wt 177.4 lb

## 2017-09-16 DIAGNOSIS — Z7982 Long term (current) use of aspirin: Secondary | ICD-10-CM | POA: Insufficient documentation

## 2017-09-16 DIAGNOSIS — N529 Male erectile dysfunction, unspecified: Secondary | ICD-10-CM | POA: Insufficient documentation

## 2017-09-16 DIAGNOSIS — K219 Gastro-esophageal reflux disease without esophagitis: Secondary | ICD-10-CM | POA: Insufficient documentation

## 2017-09-16 DIAGNOSIS — I10 Essential (primary) hypertension: Secondary | ICD-10-CM

## 2017-09-16 DIAGNOSIS — Z79899 Other long term (current) drug therapy: Secondary | ICD-10-CM | POA: Insufficient documentation

## 2017-09-16 DIAGNOSIS — N521 Erectile dysfunction due to diseases classified elsewhere: Secondary | ICD-10-CM

## 2017-09-16 MED ORDER — SILDENAFIL CITRATE 100 MG PO TABS: 100.0000 mg | ORAL_TABLET | Freq: Every day | ORAL | 1 refills | Status: DC | PRN

## 2017-09-16 NOTE — Patient Instructions (Addendum)
Erectile Dysfunction Erectile dysfunction (ED) is the inability to get or keep an erection in order to have sexual intercourse. Erectile dysfunction may include:  Inability to get an erection.  Lack of enough hardness of the erection to allow penetration.  Loss of the erection before sex is finished. What are the causes? This condition may be caused by:  Certain medicines, such as:  Pain relievers.  Antihistamines.  Antidepressants.  Blood pressure medicines.  Water pills (diuretics).  Ulcer medicines.  Muscle relaxants.  Drugs.  Excessive drinking.  Psychological causes, such as:  Anxiety.  Depression.  Sadness.  Exhaustion.  Performance fear.  Stress.  Physical causes, such as:  Artery problems. This may include diabetes, smoking, liver disease, or atherosclerosis.  High blood pressure.  Hormonal problems, such as low testosterone.  Obesity.  Nerve problems. This may include back or pelvic injuries, diabetes mellitus, multiple sclerosis, or Parkinson disease. What are the signs or symptoms? Symptoms of this condition include:  Inability to get an erection.  Lack of enough hardness of the erection to allow penetration.  Loss of the erection before sex is finished.  Normal erections at some times, but with frequent unsatisfactory episodes.  Low sexual satisfaction in either partner due to erection problems.  A curved penis occurring with erection. The curve may cause pain or the penis may be too curved to allow for intercourse.  Never having nighttime erections. How is this diagnosed? This condition is often diagnosed by:  Performing a physical exam to find other diseases or specific problems with the penis.  Asking you detailed questions about the problem.  Performing blood tests to check for diabetes mellitus or to measure hormone levels.  Performing other tests to check for underlying health conditions.  Performing an ultrasound  exam to check for scarring.  Performing a test to check blood flow to the penis.  Doing a sleep study at home to measure nighttime erections. How is this treated? This condition may be treated by:  Medicine taken by mouth to help you achieve an erection (oral medicine).  Hormone replacement therapy to replace low testosterone levels.  Medicine that is injected into the penis. Your health care provider may instruct you how to give yourself these injections at home.  Vacuum pump. This is a pump with a ring on it. The pump and ring are placed on the penis and used to create pressure that helps the penis become erect.  Penile implant surgery. In this procedure, you may receive:  An inflatable implant. This consists of cylinders, a pump, and a reservoir. The cylinders can be inflated with a fluid that helps to create an erection, and they can be deflated after intercourse.  A semi-rigid implant. This consists of two silicone rubber rods. The rods provide some rigidity. They are also flexible, so the penis can both curve downward in its normal position and become straight for sexual intercourse.  Blood vessel surgery, to improve blood flow to the penis. During this procedure, a blood vessel from a different part of the body is placed into the penis to allow blood to flow around (bypass) damaged or blocked blood vessels.  Lifestyle changes, such as exercising more, losing weight, and quitting smoking. Follow these instructions at home: Medicines   Take over-the-counter and prescription medicines only as told by your health care provider. Do not increase the dosage without first discussing it with your health care provider.  If you are using self-injections, perform injections as directed by your   as directed by your health care provider. Make sure to avoid any veins that are on the surface of the penis. After giving an injection, apply pressure to the injection site for 5 minutes. General  instructions  Exercise regularly, as directed by your health care provider. Work with your health care provider to lose weight, if needed.  Do not use any products that contain nicotine or tobacco, such as cigarettes and e-cigarettes. If you need help quitting, ask your health care provider.  Before using a vacuum pump, read the instructions that come with the pump and discuss any questions with your health care provider.  Keep all follow-up visits as told by your health care provider. This is important. Contact a health care provider if:  You feel nauseous.  You vomit. Get help right away if:  You are taking oral or injectable medicines and you have an erection that lasts longer than 4 hours. If your health care provider is unavailable, go to the nearest emergency room for evaluation. An erection that lasts much longer than 4 hours can result in permanent damage to your penis.  You have severe pain in your groin or abdomen.  You develop redness or severe swelling of your penis.  You have redness spreading up into your groin or lower abdomen.  You are unable to urinate.  You experience chest pain or a rapid heart beat (palpitations) after taking oral medicines. Summary  Erectile dysfunction (ED) is the inability to get or keep an erection during sexual intercourse. This problem can usually be treated successfully.  This condition is diagnosed based on a physical exam, your symptoms, and tests to determine the cause. Treatment varies depending on the cause, and may include medicines, hormone therapy, surgery, or vacuum pump.  You may need follow-up visits to make sure that you are using your medicines or devices correctly.  Get help right away if you are taking or injecting medicines and you have an erection that lasts longer than 4 hours. This information is not intended to replace advice given to you by your health care provider. Make sure you discuss any questions you have with  your health care provider. Document Released: 06/14/2000 Document Revised: 07/03/2016 Document Reviewed: 07/03/2016 Elsevier Interactive Patient Education  2017 [employer]. Sildenafil tablets (Viagra) What is this medicine? SILDENAFIL (sil DEN a fil) is used to treat erection problems in men. This medicine may be used for other purposes; ask your health care provider or pharmacist if you have questions. COMMON BRAND NAME(S): Viagra What should I tell my health care provider before I take this medicine? They need to know if you have any of these conditions: -bleeding disorders -eye or vision problems, including a rare inherited eye disease called retinitis pigmentosa -anatomical deformation of the penis, Peyronie's disease, or history of priapism (painful and prolonged erection) -heart disease, angina, a history of heart attack, irregular heart beats, or other heart problems -high or low blood pressure -history of blood diseases, like sickle cell anemia or leukemia -history of stomach bleeding -kidney disease -liver disease -stroke -an unusual or allergic reaction to sildenafil, other medicines, foods, dyes, or preservatives -pregnant or trying to get pregnant -breast-feeding How should I use this medicine? Take this medicine by mouth with a glass of water. Follow the directions on the prescription label. The dose is usually taken 1 hour before sexual activity. You should not take the dose more than once per day. Do not take your medicine more often than directed. Talk  to your pediatrician regarding the use of this medicine in children. This medicine is not used in children for this condition. Overdosage: If you think you have taken too much of this medicine contact a poison control center or emergency room at once. NOTE: This medicine is only for you. Do not share this medicine with others. What if I miss a dose? This does not apply. Do not take double or extra doses. What may  interact with this medicine? Do not take this medicine with any of the following medications: -cisapride -nitrates like amyl nitrite, isosorbide dinitrate, isosorbide mononitrate, nitroglycerin -riociguat This medicine may also interact with the following medications: -antiviral medicines for HIV or AIDS -bosentan -certain medicines for benign prostatic hyperplasia (BPH) -certain medicines for blood pressure -certain medicines for fungal infections like ketoconazole and itraconazole -cimetidine -erythromycin -rifampin This list may not describe all possible interactions. Give your health care provider a list of all the medicines, herbs, non-prescription drugs, or dietary supplements you use. Also tell them if you smoke, drink alcohol, or use illegal drugs. Some items may interact with your medicine. What should I watch for while using this medicine? If you notice any changes in your vision while taking this drug, call your doctor or health care professional as soon as possible. Stop using this medicine and call your health care provider right away if you have a loss of sight in one or both eyes. Contact your doctor or health care professional right away if you have an erection that lasts longer than 4 hours or if it becomes painful. This may be a sign of a serious problem and must be treated right away to prevent permanent damage. If you experience symptoms of nausea, dizziness, chest pain or arm pain upon initiation of sexual activity after taking this medicine, you should refrain from further activity and call your doctor or health care professional as soon as possible. Do not drink alcohol to excess (examples, 5 glasses of wine or 5 shots of whiskey) when taking this medicine. When taken in excess, alcohol can increase your chances of getting a headache or getting dizzy, increasing your heart rate or lowering your blood pressure. Using this medicine does not protect you or your partner against  HIV infection (the virus that causes AIDS) or other sexually transmitted diseases. What side effects may I notice from receiving this medicine? Side effects that you should report to your doctor or health care professional as soon as possible: -allergic reactions like skin rash, itching or hives, swelling of the face, lips, or tongue -breathing problems -changes in hearing -changes in vision -chest pain -fast, irregular heartbeat -prolonged or painful erection -seizures Side effects that usually do not require medical attention (report to your doctor or health care professional if they continue or are bothersome): -back pain -dizziness -flushing -headache -indigestion -muscle aches -nausea -stuffy or runny nose This list may not describe all possible side effects. Call your doctor for medical advice about side effects. You may report side effects to FDA at 1-800-FDA-1088. Where should I keep my medicine? Keep out of reach of children. Store at room temperature between 15 and 30 degrees C (59 and 86 degrees F). Throw away any unused medicine after the expiration date. NOTE: This sheet is a summary. It may not cover all possible information. If you have questions about this medicine, talk to your doctor, pharmacist, or health care provider.  2018 Elsevier/Gold Standard (2015-05-31 12:00:25)
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
## 2017-10-31 ENCOUNTER — Emergency Department (HOSPITAL_COMMUNITY)
Admission: EM | Admit: 2017-10-31 | Discharge: 2017-10-31 | Disposition: A | Discharge status: Home / Self Care | Payer: Self-pay | Attending: Emergency Medicine | Admitting: Emergency Medicine

## 2017-10-31 ENCOUNTER — Other Ambulatory Visit: Payer: Self-pay

## 2017-10-31 ENCOUNTER — Encounter (HOSPITAL_COMMUNITY): Payer: Self-pay

## 2017-10-31 DIAGNOSIS — Y990 Civilian activity done for income or pay: Secondary | ICD-10-CM | POA: Insufficient documentation

## 2017-10-31 DIAGNOSIS — Z79899 Other long term (current) drug therapy: Secondary | ICD-10-CM | POA: Insufficient documentation

## 2017-10-31 DIAGNOSIS — W268XXA Contact with other sharp object(s), not elsewhere classified, initial encounter: Secondary | ICD-10-CM | POA: Insufficient documentation

## 2017-10-31 DIAGNOSIS — Z23 Encounter for immunization: Secondary | ICD-10-CM | POA: Insufficient documentation

## 2017-10-31 DIAGNOSIS — Y939 Activity, unspecified: Secondary | ICD-10-CM | POA: Insufficient documentation

## 2017-10-31 DIAGNOSIS — Y929 Unspecified place or not applicable: Secondary | ICD-10-CM | POA: Insufficient documentation

## 2017-10-31 DIAGNOSIS — I160 Hypertensive urgency: Secondary | ICD-10-CM | POA: Insufficient documentation

## 2017-10-31 DIAGNOSIS — Z7982 Long term (current) use of aspirin: Secondary | ICD-10-CM | POA: Insufficient documentation

## 2017-10-31 DIAGNOSIS — S61211A Laceration without foreign body of left index finger without damage to nail, initial encounter: Secondary | ICD-10-CM | POA: Insufficient documentation

## 2017-10-31 DIAGNOSIS — I10 Essential (primary) hypertension: Secondary | ICD-10-CM

## 2017-10-31 MED ORDER — TETANUS-DIPHTH-ACELL PERTUSSIS 5-2.5-18.5 LF-MCG/0.5 IM SUSP
0.5000 mL | Freq: Once | INTRAMUSCULAR | Status: AC
  Administered 2017-10-31: 0.5 mL via INTRAMUSCULAR
  Filled 2017-10-31: qty 0.5

## 2017-10-31 MED ORDER — HYDRALAZINE HCL 50 MG PO TABS: 50.0000 mg | ORAL_TABLET | Freq: Three times a day (TID) | ORAL | 0 refills | Status: DC

## 2017-10-31 MED ORDER — ACETAMINOPHEN 325 MG PO TABS
650.0000 mg | ORAL_TABLET | Freq: Once | ORAL | Status: AC
Start: 2017-10-31 — End: 2017-10-31
  Administered 2017-10-31: 650 mg via ORAL
  Filled 2017-10-31: qty 2

## 2017-10-31 MED ORDER — LISINOPRIL 20 MG PO TABS
40.0000 mg | ORAL_TABLET | Freq: Once | ORAL | Status: AC
  Administered 2017-10-31: 40 mg via ORAL
  Filled 2017-10-31: qty 2

## 2017-10-31 MED ORDER — HYDRALAZINE HCL 25 MG PO TABS
50.0000 mg | ORAL_TABLET | Freq: Once | ORAL | Status: AC
  Administered 2017-10-31: 50 mg via ORAL
  Filled 2017-10-31: qty 2

## 2017-10-31 MED ORDER — LISINOPRIL 40 MG PO TABS: 40.0000 mg | ORAL_TABLET | Freq: Every day | ORAL | 0 refills | Status: DC

## 2017-10-31 MED ORDER — AMLODIPINE BESYLATE 5 MG PO TABS
10.0000 mg | ORAL_TABLET | Freq: Once | ORAL | Status: AC
  Administered 2017-10-31: 10 mg via ORAL
  Filled 2017-10-31: qty 2

## 2017-10-31 MED ORDER — AMLODIPINE BESYLATE 10 MG PO TABS: 10.0000 mg | ORAL_TABLET | Freq: Every day | ORAL | 0 refills | Status: DC
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
## 2017-10-31 NOTE — ED Triage Notes (Signed)
Pt endorses cutting left index finger with a razer blade by accident at work this morning, pt sent here for tetanus shot. No bleeding. Pt has hx of htn, but out of BP meds at home, hypertensive in triage.

## 2017-10-31 NOTE — ED Notes (Signed)
Laceration cleaned with soap and water

## 2017-11-20 ENCOUNTER — Encounter (HOSPITAL_COMMUNITY): Payer: Self-pay | Admitting: Emergency Medicine

## 2017-11-20 ENCOUNTER — Emergency Department (HOSPITAL_COMMUNITY)
Admission: EM | Admit: 2017-11-20 | Discharge: 2017-11-20 | Disposition: A | Discharge status: Home / Self Care | Payer: Self-pay | Attending: Emergency Medicine | Admitting: Emergency Medicine

## 2017-11-20 DIAGNOSIS — I129 Hypertensive chronic kidney disease with stage 1 through stage 4 chronic kidney disease, or unspecified chronic kidney disease: Secondary | ICD-10-CM | POA: Insufficient documentation

## 2017-11-20 DIAGNOSIS — W57XXXA Bitten or stung by nonvenomous insect and other nonvenomous arthropods, initial encounter: Secondary | ICD-10-CM | POA: Insufficient documentation

## 2017-11-20 DIAGNOSIS — Z7982 Long term (current) use of aspirin: Secondary | ICD-10-CM | POA: Insufficient documentation

## 2017-11-20 DIAGNOSIS — Z79899 Other long term (current) drug therapy: Secondary | ICD-10-CM | POA: Insufficient documentation

## 2017-11-20 DIAGNOSIS — N4889 Other specified disorders of penis: Secondary | ICD-10-CM | POA: Insufficient documentation

## 2017-11-20 DIAGNOSIS — N182 Chronic kidney disease, stage 2 (mild): Secondary | ICD-10-CM | POA: Insufficient documentation

## 2017-11-20 MED ORDER — DOXYCYCLINE HYCLATE 100 MG PO CAPS: 100.0000 mg | ORAL_CAPSULE | Freq: Two times a day (BID) | ORAL | 0 refills | Status: DC

## 2017-11-20 MED ORDER — CLONIDINE HCL 0.1 MG PO TABS
0.1000 mg | ORAL_TABLET | Freq: Once | ORAL | Status: AC
  Administered 2017-11-20: 0.1 mg via ORAL
  Filled 2017-11-20: qty 1

## 2017-11-20 MED ORDER — HYDROXYZINE HCL 25 MG PO TABS
25.0000 mg | ORAL_TABLET | Freq: Once | ORAL | Status: AC
  Administered 2017-11-20: 25 mg via ORAL
  Filled 2017-11-20: qty 1

## 2017-11-20 MED ORDER — HYDROXYZINE HCL 25 MG PO TABS: 25.0000 mg | ORAL_TABLET | Freq: Four times a day (QID) | ORAL | 0 refills | Status: DC

## 2017-11-20 NOTE — Discharge Instructions (Addendum)
You may continue to use the benadryl cream to area, as well as hydrocortisone cream (not to penis).  Follow up with your primary care physician.  Return here for any worsening symptoms.

## 2017-11-20 NOTE — ED Notes (Signed)
With discharge BP noted elevated more than when collected with triage. Belfi MD verbalizes will assess pt and reevaluate.
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
## 2017-11-20 NOTE — ED Triage Notes (Signed)
Pt reports that 3 days ago he found a tick on his penis. Reports he was able to get entire tick including head out. Repots noticed some swelling at site.

## 2017-11-20 NOTE — ED Notes (Signed)
Belfi MD at bedside.

## 2017-11-25 MED ORDER — HYDROXYZINE HCL 25 MG PO TABS: 25.0000 mg | ORAL_TABLET | Freq: Four times a day (QID) | ORAL | 0 refills | Status: DC

## 2017-11-25 MED ORDER — DOXYCYCLINE HYCLATE 100 MG PO CAPS: 100.0000 mg | ORAL_CAPSULE | Freq: Two times a day (BID) | ORAL | 0 refills | Status: DC

## 2017-11-25 MED FILL — DOXYCYCLINE HYCLATE 100 MG: 100 | 7 days supply | Qty: 14 | Fill #0

## 2017-11-25 NOTE — ED Provider Notes (Signed)
RN states patient called ED stating that he accidentally left his prescriptions for doxycycline and Atarax on the pocket of his jeans and then put his jeans of the washing machine.  He is requesting duplicate prescriptions as he has not taken any of the medications yet.  He was seen on 5/23 for tick bite to penis.  Medications were reviewed from this note.  Will provide new prescriptions. Denies any other complaints.   Delia Heady, PA-C 11/25/17 1514    Fatima Blank, MD 11/25/17 [phone]

## 2017-12-02 MED FILL — hydrOXYzine HCL 25 MG TABS: 25 | 3 days supply | Qty: 12 | Fill #0

## 2018-02-24 ENCOUNTER — Encounter (HOSPITAL_COMMUNITY): Payer: Self-pay | Admitting: Emergency Medicine

## 2018-02-24 ENCOUNTER — Emergency Department (HOSPITAL_COMMUNITY): Payer: Medicaid Other

## 2018-02-24 ENCOUNTER — Other Ambulatory Visit: Payer: Self-pay

## 2018-02-24 ENCOUNTER — Inpatient Hospital Stay (HOSPITAL_COMMUNITY)
Admission: EM | Admit: 2018-02-24 | Discharge: 2018-03-01 | DRG: 305 | Disposition: A | Discharge status: Home / Self Care | Payer: Medicaid Other | Attending: Internal Medicine | Admitting: Internal Medicine

## 2018-02-24 DIAGNOSIS — N179 Acute kidney failure, unspecified: Secondary | ICD-10-CM | POA: Diagnosis present

## 2018-02-24 DIAGNOSIS — N183 Chronic kidney disease, stage 3 unspecified: Secondary | ICD-10-CM

## 2018-02-24 DIAGNOSIS — I493 Ventricular premature depolarization: Secondary | ICD-10-CM | POA: Diagnosis present

## 2018-02-24 DIAGNOSIS — M19011 Primary osteoarthritis, right shoulder: Secondary | ICD-10-CM | POA: Diagnosis present

## 2018-02-24 DIAGNOSIS — I160 Hypertensive urgency: Secondary | ICD-10-CM | POA: Diagnosis present

## 2018-02-24 DIAGNOSIS — I130 Hypertensive heart and chronic kidney disease with heart failure and stage 1 through stage 4 chronic kidney disease, or unspecified chronic kidney disease: Secondary | ICD-10-CM | POA: Diagnosis not present

## 2018-02-24 DIAGNOSIS — R21 Rash and other nonspecific skin eruption: Secondary | ICD-10-CM | POA: Diagnosis not present

## 2018-02-24 DIAGNOSIS — M7551 Bursitis of right shoulder: Secondary | ICD-10-CM | POA: Diagnosis not present

## 2018-02-24 DIAGNOSIS — R9431 Abnormal electrocardiogram [ECG] [EKG]: Secondary | ICD-10-CM | POA: Diagnosis present

## 2018-02-24 DIAGNOSIS — E785 Hyperlipidemia, unspecified: Secondary | ICD-10-CM | POA: Diagnosis not present

## 2018-02-24 DIAGNOSIS — M25511 Pain in right shoulder: Secondary | ICD-10-CM

## 2018-02-24 DIAGNOSIS — R001 Bradycardia, unspecified: Secondary | ICD-10-CM | POA: Diagnosis not present

## 2018-02-24 DIAGNOSIS — E876 Hypokalemia: Secondary | ICD-10-CM | POA: Diagnosis present

## 2018-02-24 DIAGNOSIS — I10 Essential (primary) hypertension: Secondary | ICD-10-CM

## 2018-02-24 DIAGNOSIS — N182 Chronic kidney disease, stage 2 (mild): Secondary | ICD-10-CM | POA: Diagnosis present

## 2018-02-24 DIAGNOSIS — I5032 Chronic diastolic (congestive) heart failure: Secondary | ICD-10-CM | POA: Diagnosis present

## 2018-02-24 DIAGNOSIS — Z9114 Patient's other noncompliance with medication regimen: Secondary | ICD-10-CM | POA: Diagnosis not present

## 2018-02-24 DIAGNOSIS — I161 Hypertensive emergency: Principal | ICD-10-CM | POA: Diagnosis present

## 2018-02-24 LAB — CBC WITH DIFFERENTIAL/PLATELET
Abs Immature Granulocytes: 0 10*3/uL (ref 0.0–0.1)
Basophils Absolute: 0 10*3/uL (ref 0.0–0.1)
Basophils Relative: 1 %
Eosinophils Absolute: 0.1 10*3/uL (ref 0.0–0.7)
Eosinophils Relative: 2 %
HCT: 44.5 % (ref 39.0–52.0)
Hemoglobin: 14.6 g/dL (ref 13.0–17.0)
Immature Granulocytes: 0 %
Lymphocytes Relative: 26 %
Lymphs Abs: 1.5 10*3/uL (ref 0.7–4.0)
MCH: 31.1 pg (ref 26.0–34.0)
MCHC: 32.8 g/dL (ref 30.0–36.0)
MCV: 94.9 fL (ref 78.0–100.0)
Monocytes Absolute: 0.5 10*3/uL (ref 0.1–1.0)
Monocytes Relative: 8 %
Neutro Abs: 3.7 10*3/uL (ref 1.7–7.7)
Neutrophils Relative %: 63 %
Platelets: 163 10*3/uL (ref 150–400)
RBC: 4.69 MIL/uL (ref 4.22–5.81)
RDW: 12.4 % (ref 11.5–15.5)
WBC: 5.9 10*3/uL (ref 4.0–10.5)

## 2018-02-24 LAB — URINALYSIS, ROUTINE W REFLEX MICROSCOPIC
Bacteria, UA: NONE SEEN
Bilirubin Urine: NEGATIVE
Glucose, UA: 50 mg/dL — AB
Hgb urine dipstick: NEGATIVE
Ketones, ur: NEGATIVE mg/dL
Leukocytes, UA: NEGATIVE
Nitrite: NEGATIVE
Protein, ur: 100 mg/dL — AB
Specific Gravity, Urine: 1.009 (ref 1.005–1.030)
pH: 8 (ref 5.0–8.0)

## 2018-02-24 LAB — TROPONIN I: Troponin I: 0.05 ng/mL (ref <0.03)

## 2018-02-24 LAB — BASIC METABOLIC PANEL
Anion gap: 8 (ref 5–15)
BUN: 28 mg/dL — ABNORMAL HIGH (ref 8–23)
CO2: 29 mmol/L (ref 22–32)
Calcium: 9 mg/dL (ref 8.9–10.3)
Chloride: 104 mmol/L (ref 98–111)
Creatinine, Ser: 2.18 mg/dL — ABNORMAL HIGH (ref 0.61–1.24)
GFR calc Af Amer: 36 mL/min — ABNORMAL LOW (ref >60)
GFR calc non Af Amer: 31 mL/min — ABNORMAL LOW (ref >60)
Glucose, Bld: 90 mg/dL (ref 70–99)
Potassium: 3.2 mmol/L — ABNORMAL LOW (ref 3.5–5.1)
Sodium: 141 mmol/L (ref 135–145)

## 2018-02-24 LAB — TSH: TSH: 2.534 u[IU]/mL (ref 0.350–4.500)

## 2018-02-24 MED ORDER — VITAMIN B-12 100 MCG PO TABS
100.0000 ug | ORAL_TABLET | Freq: Every day | ORAL | Status: DC
  Administered 2018-02-25 – 2018-03-01 (×5): 100 ug via ORAL
  Filled 2018-02-24 (×5): qty 1

## 2018-02-24 MED ORDER — SPIRONOLACTONE 25 MG PO TABS
25.0000 mg | ORAL_TABLET | Freq: Every day | ORAL | Status: DC
  Administered 2018-02-25 – 2018-02-26 (×2): 25 mg via ORAL
  Filled 2018-02-24 (×2): qty 1

## 2018-02-24 MED ORDER — SPIRONOLACTONE 25 MG PO TABS
25.0000 mg | ORAL_TABLET | Freq: Every day | ORAL | Status: DC
  Administered 2018-02-24: 25 mg via ORAL
  Filled 2018-02-24: qty 1

## 2018-02-24 MED ORDER — VITAMIN B-6 50 MG PO TABS
50.0000 mg | ORAL_TABLET | Freq: Every day | ORAL | Status: DC
  Administered 2018-02-25 – 2018-03-01 (×5): 50 mg via ORAL
  Filled 2018-02-24 (×5): qty 1

## 2018-02-24 MED ORDER — LISINOPRIL 20 MG PO TABS
40.0000 mg | ORAL_TABLET | Freq: Every day | ORAL | Status: DC
  Administered 2018-02-25 – 2018-03-01 (×5): 40 mg via ORAL
  Filled 2018-02-24 (×5): qty 2

## 2018-02-24 MED ORDER — HYDRALAZINE HCL 25 MG PO TABS: 50.0000 mg | ORAL_TABLET | Freq: Once | ORAL | Status: DC

## 2018-02-24 MED ORDER — DIPHENHYDRAMINE HCL 25 MG PO CAPS
25.0000 mg | ORAL_CAPSULE | Freq: Every day | ORAL | Status: DC | PRN
  Administered 2018-02-25 – 2018-02-26 (×2): 25 mg via ORAL
  Filled 2018-02-24 (×2): qty 1

## 2018-02-24 MED ORDER — ONDANSETRON HCL 4 MG PO TABS: 4.0000 mg | ORAL_TABLET | Freq: Four times a day (QID) | ORAL | Status: DC | PRN

## 2018-02-24 MED ORDER — AMLODIPINE BESYLATE 10 MG PO TABS
10.0000 mg | ORAL_TABLET | Freq: Every day | ORAL | Status: DC
  Administered 2018-02-25 – 2018-03-01 (×5): 10 mg via ORAL
  Filled 2018-02-24 (×5): qty 1

## 2018-02-24 MED ORDER — HYDRALAZINE HCL 20 MG/ML IJ SOLN
10.0000 mg | INTRAMUSCULAR | Status: DC | PRN
  Administered 2018-02-24: 10 mg via INTRAVENOUS
  Filled 2018-02-24: qty 1

## 2018-02-24 MED ORDER — OMEGA-3-ACID ETHYL ESTERS 1 G PO CAPS
1.0000 | ORAL_CAPSULE | Freq: Every day | ORAL | Status: DC
  Administered 2018-02-25 – 2018-03-01 (×5): 1 g via ORAL
  Filled 2018-02-24 (×5): qty 1

## 2018-02-24 MED ORDER — HYDRALAZINE HCL 50 MG PO TABS
50.0000 mg | ORAL_TABLET | Freq: Three times a day (TID) | ORAL | Status: DC
  Administered 2018-02-24 – 2018-02-26 (×7): 50 mg via ORAL
  Filled 2018-02-24 (×3): qty 1
  Filled 2018-02-24: qty 2
  Filled 2018-02-24 (×3): qty 1

## 2018-02-24 MED ORDER — AMLODIPINE BESYLATE 5 MG PO TABS
10.0000 mg | ORAL_TABLET | Freq: Once | ORAL | Status: AC
  Administered 2018-02-24: 10 mg via ORAL
  Filled 2018-02-24: qty 2

## 2018-02-24 MED ORDER — DICLOFENAC SODIUM 1 % TD GEL
4.0000 g | Freq: Four times a day (QID) | TRANSDERMAL | Status: DC
  Administered 2018-02-24 – 2018-03-01 (×15): 4 g via TOPICAL
  Filled 2018-02-24: qty 100

## 2018-02-24 MED ORDER — ONDANSETRON HCL 4 MG/2ML IJ SOLN: 4.0000 mg | Freq: Four times a day (QID) | INTRAMUSCULAR | Status: DC | PRN

## 2018-02-24 MED ORDER — ACETAMINOPHEN 500 MG PO TABS
1000.0000 mg | ORAL_TABLET | Freq: Once | ORAL | Status: AC
  Administered 2018-02-24: 1000 mg via ORAL
  Filled 2018-02-24: qty 2

## 2018-02-24 MED ORDER — SODIUM CHLORIDE 0.45 % IV SOLN
INTRAVENOUS | Status: DC
  Administered 2018-02-25 (×2): via INTRAVENOUS

## 2018-02-24 MED ORDER — LISINOPRIL 20 MG PO TABS
40.0000 mg | ORAL_TABLET | Freq: Once | ORAL | Status: AC
  Administered 2018-02-24: 40 mg via ORAL
  Filled 2018-02-24: qty 2

## 2018-02-24 MED ORDER — HYDRALAZINE HCL 20 MG/ML IJ SOLN
5.0000 mg | Freq: Once | INTRAMUSCULAR | Status: DC
  Filled 2018-02-24: qty 1

## 2018-02-24 MED ORDER — ASPIRIN 81 MG PO CHEW
81.0000 mg | CHEWABLE_TABLET | Freq: Every day | ORAL | Status: DC
  Administered 2018-02-25 – 2018-03-01 (×5): 81 mg via ORAL
  Filled 2018-02-24 (×6): qty 1

## 2018-02-24 MED ORDER — HYDROXYZINE HCL 25 MG PO TABS
25.0000 mg | ORAL_TABLET | Freq: Four times a day (QID) | ORAL | Status: DC
  Administered 2018-02-24: 25 mg via ORAL
  Filled 2018-02-24: qty 1

## 2018-02-24 MED ORDER — B COMPLEX-C PO TABS
1.0000 | ORAL_TABLET | Freq: Every day | ORAL | Status: DC
  Administered 2018-02-25 – 2018-03-01 (×5): 1 via ORAL
  Filled 2018-02-24 (×5): qty 1

## 2018-02-24 MED ORDER — HEPARIN SODIUM (PORCINE) 5000 UNIT/ML IJ SOLN
5000.0000 [IU] | Freq: Three times a day (TID) | INTRAMUSCULAR | Status: DC
  Administered 2018-02-24 – 2018-03-01 (×14): 5000 [IU] via SUBCUTANEOUS
  Filled 2018-02-24 (×14): qty 1

## 2018-02-24 MED ORDER — HYDRALAZINE HCL 20 MG/ML IJ SOLN
10.0000 mg | Freq: Once | INTRAMUSCULAR | Status: AC
  Administered 2018-02-24: 10 mg via INTRAVENOUS

## 2018-02-24 MED ORDER — VITAMIN B-1 100 MG PO TABS
100.0000 mg | ORAL_TABLET | Freq: Every day | ORAL | Status: DC
  Administered 2018-02-25 – 2018-03-01 (×5): 100 mg via ORAL
  Filled 2018-02-24 (×5): qty 1

## 2018-02-24 NOTE — ED Notes (Signed)
RN notified about blood pressure of 248/133, pt to be roomed next

## 2018-02-24 NOTE — ED Triage Notes (Addendum)
Pt states last night he took his bp and it was in the 200's pt states he ran out of his bp medication. Pt ran out of medication 3 weeks ago. Pt reports no chest pains. Or SOB.  Pt also has a rash on the base of his neck and states it it causing his right shoulder to hurt.

## 2018-02-24 NOTE — ED Provider Notes (Signed)
Patient placed in Quick Look pathway, seen and evaluated   Chief Complaint: Hypertension, rash  HPI:   Patient with 3-week history of hypertension.  States he has been out of all of his hypertension medicines for 3 weeks.  Took his blood pressure yesterday which was 220/147.  Does note right-sided chest pains for the last 3 days which began after lifting a heavy object.  Pain radiates to the right shoulder.  Denies shortness of breath.  Sharp, worsens with movement of the right upper extremity.  He also notes a rash to the back extending to his shoulder for the past 3 days or so.  Denies fevers, no new shampoos, soaps, detergents, or lotions.  Notes numbness in the distribution of the right deltoid.  Denies headaches, vision changes, decreased urine output, or abdominal pain.  ROS: Positive for chest pain, rash numbness Negative for headaches, fevers, shortness of breath  Physical Exam:   Gen: No distress  Neuro: Awake and Alert  Skin: Warm    Focused Exam: Heart regular rate and rhythm.  Lungs clear to auscultation bilaterally.  Right anterior chest wall tender to palpation in the pectoralis distribution.  Vesicular rash noted to the upper back extending to the right upper extremity.   Initiation of care has begun. The patient has been counseled on the process, plan, and necessity for staying for the completion/evaluation, and the remainder of the medical screening examination    Debroah Baller 02/24/18 [place], Kevin, MD 02/24/18 [phone]

## 2018-02-24 NOTE — ED Notes (Signed)
ED Provider at bedside. 
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
## 2018-02-25 ENCOUNTER — Encounter (HOSPITAL_COMMUNITY): Payer: Self-pay

## 2018-02-25 ENCOUNTER — Other Ambulatory Visit: Payer: Self-pay

## 2018-02-25 ENCOUNTER — Inpatient Hospital Stay (HOSPITAL_COMMUNITY): Payer: Medicaid Other

## 2018-02-25 DIAGNOSIS — I509 Heart failure, unspecified: Secondary | ICD-10-CM

## 2018-02-25 DIAGNOSIS — M25511 Pain in right shoulder: Secondary | ICD-10-CM

## 2018-02-25 DIAGNOSIS — I161 Hypertensive emergency: Secondary | ICD-10-CM | POA: Diagnosis present

## 2018-02-25 DIAGNOSIS — N182 Chronic kidney disease, stage 2 (mild): Secondary | ICD-10-CM

## 2018-02-25 DIAGNOSIS — R001 Bradycardia, unspecified: Secondary | ICD-10-CM

## 2018-02-25 DIAGNOSIS — R21 Rash and other nonspecific skin eruption: Secondary | ICD-10-CM

## 2018-02-25 DIAGNOSIS — E876 Hypokalemia: Secondary | ICD-10-CM

## 2018-02-25 LAB — COMPREHENSIVE METABOLIC PANEL
ALT: 22 U/L (ref 0–44)
AST: 28 U/L (ref 15–41)
Albumin: 3.5 g/dL (ref 3.5–5.0)
Alkaline Phosphatase: 45 U/L (ref 38–126)
Anion gap: 12 (ref 5–15)
BUN: 25 mg/dL — ABNORMAL HIGH (ref 8–23)
CO2: 26 mmol/L (ref 22–32)
Calcium: 9 mg/dL (ref 8.9–10.3)
Chloride: 101 mmol/L (ref 98–111)
Creatinine, Ser: 2.03 mg/dL — ABNORMAL HIGH (ref 0.61–1.24)
GFR calc Af Amer: 39 mL/min — ABNORMAL LOW (ref >60)
GFR calc non Af Amer: 34 mL/min — ABNORMAL LOW (ref >60)
Glucose, Bld: 106 mg/dL — ABNORMAL HIGH (ref 70–99)
Potassium: 3 mmol/L — ABNORMAL LOW (ref 3.5–5.1)
Sodium: 139 mmol/L (ref 135–145)
Total Bilirubin: 1 mg/dL (ref 0.3–1.2)
Total Protein: 6.8 g/dL (ref 6.5–8.1)

## 2018-02-25 LAB — CBC
HCT: 47 % (ref 39.0–52.0)
Hemoglobin: 15.6 g/dL (ref 13.0–17.0)
MCH: 30.8 pg (ref 26.0–34.0)
MCHC: 33.2 g/dL (ref 30.0–36.0)
MCV: 92.7 fL (ref 78.0–100.0)
Platelets: 165 10*3/uL (ref 150–400)
RBC: 5.07 MIL/uL (ref 4.22–5.81)
RDW: 12.5 % (ref 11.5–15.5)
WBC: 5.6 10*3/uL (ref 4.0–10.5)

## 2018-02-25 LAB — TROPONIN I
Troponin I: 0.07 ng/mL (ref <0.03)
Troponin I: 0.05 ng/mL (ref <0.03)

## 2018-02-25 LAB — ECHOCARDIOGRAM COMPLETE
Height: 66 in
Weight: 2686.4 oz

## 2018-02-25 LAB — HIV ANTIBODY (ROUTINE TESTING W REFLEX): HIV Screen 4th Generation wRfx: NONREACTIVE

## 2018-02-25 LAB — MRSA PCR SCREENING: MRSA by PCR: NEGATIVE

## 2018-02-25 MED ORDER — TRAMADOL HCL 50 MG PO TABS
50.0000 mg | ORAL_TABLET | Freq: Once | ORAL | Status: AC
  Administered 2018-02-25: 50 mg via ORAL
  Filled 2018-02-25: qty 1

## 2018-02-25 MED ORDER — ACETAMINOPHEN 325 MG PO TABS
650.0000 mg | ORAL_TABLET | Freq: Four times a day (QID) | ORAL | Status: DC | PRN
  Administered 2018-02-25: 650 mg via ORAL
  Filled 2018-02-25: qty 2

## 2018-02-25 MED ORDER — TRAMADOL HCL 50 MG PO TABS
50.0000 mg | ORAL_TABLET | Freq: Two times a day (BID) | ORAL | Status: DC | PRN
  Administered 2018-02-25 – 2018-03-01 (×6): 50 mg via ORAL
  Filled 2018-02-25 (×6): qty 1

## 2018-02-25 MED ORDER — HYDRALAZINE HCL 20 MG/ML IJ SOLN
10.0000 mg | INTRAMUSCULAR | Status: DC | PRN
  Administered 2018-02-25 – 2018-02-27 (×4): 10 mg via INTRAVENOUS
  Filled 2018-02-25 (×4): qty 1

## 2018-02-25 NOTE — Progress Notes (Addendum)
PROGRESS NOTE  Corey Guerrero ZWC:[id number] DOB: 1955/09/03 DOA: 02/24/2018 PCP: [name], NP  HPI/Recap of past 24 hours: Corey Guerrero is a 62 y.o. male with medical history significant of hypertension, hyperlipidemia who has been noncompliant with his home medications.  Patient has had brittle hypertension on 4 different medications including hydralazine, lisinopril, Norvasc and Aldactone.  He has not been taking the medications for weeks.  He checked his blood pressure when he felt weak generalized aches and headache.  Systolic blood pressure was 230 at the time.  He went to work and was still feeling bad so he decided to come to the emergency room.   In the ER patient was found to have initial blood pressure of 255/148.  He is therefore being admitted with hypertensive urgency.  He denied chest pain but is having headaches and generalized aches.  Denied any neurologic symptoms.  Admitted for hypertensive emergency with acute kidney injury on CKD 3.  02/25/2018: Patient seen and examined at his bedside.  He reports right shoulder pain which is chronic.  Denies any chest pain or dyspnea.  Denies headache or change in vision.  Voltaren gel as needed for musculoskeletal pain.  Assessment/Plan: Principal Problem:   Hypertensive urgency, malignant Active Problems:   Hypokalemia   Abnormal EKG   CKD (chronic kidney disease) stage 2, GFR 60-89 ml/min   Bradycardia   Hypertensive emergency   Hypertensive emergency suspect secondary to medication noncompliance Blood pressure on admission to 255/148 with renal involvement AKI on CKD 3 Improving Continue current antihypertensive med regimen Continue to monitor vital signs  AKI on CKD 3 Baseline creatinine 1.7 with GFR 48 Creatinine on presentation 2.18 Creatinine still elevated today at 2.03 Continue to avoid nephrotoxic agents/dehydration/ Repeat BMP in the morning Obtain renal ultrasound Monitor urine output Currently on  half-normal saline at 50 cc/h  Right AC joint degenerative changes Voltaren gel as needed for mild to moderate pain Tramadol as needed for moderate to severe pain  Hypokalemia Repleted with oral potassium supplement Also given IV magnesium 2 g once Repeat BMP in the morning  Chronic diastolic CHF Last 2D echo done on 04/19/2015 revealed LVEF 55 to 60% with grade 1 diastolic dysfunction Strict I's and O's Daily weight  Medication noncompliance Counseled on the importance of medication compliance Resume home antihypertensive medications    Code Status: Full code  Family Communication: None at bedside  Disposition Plan: Home possibly tomorrow 02/26/2018   Consultants:  None  Procedures:  None  Antimicrobials:  None  DVT prophylaxis: Subcu heparin 5000 3 times daily   Objective: Vitals:   02/25/18 0428 02/25/18 0724 02/25/18 0800 02/25/18 0900  BP: (!) 150/92 (!) 159/103 (!) 159/103 (!) 146/93  Pulse: 60 62 62 65  Resp: 20 (!) 23 17 19   Temp: 98.5 F (36.9 C) 97.8 F (36.6 C)    TempSrc: Oral Oral    SpO2: 96% 96%  94%  Weight:      Height:        Intake/Output Summary (Last 24 hours) at 02/25/2018 1046 Last data filed at 02/25/2018 0934 Gross per 24 hour  Intake 562.82 ml  Output 475 ml  Net 87.82 ml   Filed Weights   02/25/18 0209  Weight: 76.2 kg    Exam:  . General: 62 y.o. year-old male well developed well nourished in no acute distress.  Alert and oriented x3. . Cardiovascular: Regular rate and rhythm with no rubs or gallops.  No  thyromegaly or JVD noted.   Marland Kitchen Respiratory: Clear to auscultation with no wheezes or rales. Good inspiratory effort. . Abdomen: Soft nontender nondistended with normal bowel sounds x4 quadrants. . Musculoskeletal: No lower extremity edema. 2/4 pulses in all 4 extremities. . Skin: No ulcerative lesions noted or rashes, . Psychiatry: Mood is appropriate for condition and setting   Data Reviewed: CBC: Recent  Labs  Lab 02/24/18 1501 02/25/18 0326  WBC 5.9 5.6  NEUTROABS 3.7  --   HGB 14.6 15.6  HCT 44.5 47.0  MCV 94.9 92.7  PLT 163 814   Basic Metabolic Panel: Recent Labs  Lab 02/24/18 1501 02/25/18 0326  NA 141 139  K 3.2* 3.0*  CL 104 101  CO2 29 26  GLUCOSE 90 106*  BUN 28* 25*  CREATININE 2.18* 2.03*  CALCIUM 9.0 9.0   GFR: Estimated Creatinine Clearance: 34.5 mL/min (A) (by C-G formula based on SCr of 2.03 mg/dL (H)). Liver Function Tests: Recent Labs  Lab 02/25/18 0326  AST 28  ALT 22  ALKPHOS 45  BILITOT 1.0  PROT 6.8  ALBUMIN 3.5   No results for input(s): LIPASE, AMYLASE in the last 168 hours. No results for input(s): AMMONIA in the last 168 hours. Coagulation Profile: No results for input(s): INR, PROTIME in the last 168 hours. Cardiac Enzymes: Recent Labs  Lab 02/24/18 2231 02/25/18 0326 02/25/18 0930  TROPONINI 0.05* 0.07* 0.05*   BNP (last 3 results) No results for input(s): PROBNP in the last 8760 hours. HbA1C: No results for input(s): HGBA1C in the last 72 hours. CBG: No results for input(s): GLUCAP in the last 168 hours. Lipid Profile: No results for input(s): CHOL, HDL, LDLCALC, TRIG, CHOLHDL, LDLDIRECT in the last 72 hours. Thyroid Function Tests: Recent Labs    02/24/18 2231  TSH 2.534   Anemia Panel: No results for input(s): VITAMINB12, FOLATE, FERRITIN, TIBC, IRON, RETICCTPCT in the last 72 hours. Urine analysis:    Component Value Date/Time   COLORURINE STRAW (A) 02/24/2018 1750   APPEARANCEUR CLEAR 02/24/2018 1750   LABSPEC 1.009 02/24/2018 1750   PHURINE 8.0 02/24/2018 1750   GLUCOSEU 50 (A) 02/24/2018 1750   HGBUR NEGATIVE 02/24/2018 1750   BILIRUBINUR NEGATIVE 02/24/2018 1750   BILIRUBINUR neg 06/15/2015 0950   KETONESUR NEGATIVE 02/24/2018 1750   PROTEINUR 100 (A) 02/24/2018 1750   UROBILINOGEN 0.2 06/15/2015 0950   UROBILINOGEN 1.0 03/22/2014 1615   NITRITE NEGATIVE 02/24/2018 1750   LEUKOCYTESUR NEGATIVE  02/24/2018 1750   Sepsis Labs: @LABRCNTIP (procalcitonin:4,lacticidven:4)  ) Recent Results (from the past 240 hour(s))  MRSA PCR Screening     Status: None   Collection Time: 02/25/18  3:02 AM  Result Value Ref Range Status   MRSA by PCR NEGATIVE NEGATIVE Final    Comment:        The GeneXpert MRSA Assay (FDA approved for NASAL specimens only), is one component of a comprehensive MRSA colonization surveillance program. It is not intended to diagnose MRSA infection nor to guide or monitor treatment for MRSA infections. Performed at [facility], [place] [address] [place], [place] [address]       Studies: Dg Shoulder Right  Result Date: 02/24/2018 CLINICAL DATA:  Right shoulder pain EXAM: RIGHT SHOULDER - 2+ VIEW COMPARISON:  None. FINDINGS: Deformity in the distal right clavicle likely related to old healed fracture. Degenerative changes in the right AC joint. Glenohumeral joint is intact. No acute fracture, subluxation or dislocation. IMPRESSION: No acute bony abnormality. Probable old healed  fracture of the distal right clavicle. Mild degenerative changes in the right AC joint. Electronically Signed   By: Rolm Baptise M.D.   On: 02/24/2018 19:18    Scheduled Meds: . amLODipine  10 mg Oral Daily  . aspirin  81 mg Oral Daily  . B-complex with vitamin C  1 tablet Oral Daily  . diclofenac sodium  4 g Topical QID  . heparin  5,000 Units Subcutaneous Q8H  . hydrALAZINE  50 mg Oral TID  . lisinopril  40 mg Oral Daily  . omega-3 acid ethyl esters  1 capsule Oral Daily  . vitamin B-6  50 mg Oral Daily  . spironolactone  25 mg Oral Daily  . thiamine  100 mg Oral Daily  . vitamin B-12  100 mcg Oral Daily    Continuous Infusions: . sodium chloride 50 mL/hr at 02/25/18 0900     LOS: 1 day     Kayleen Memos, MD [facility] Pager [phone]  If 7PM-7AM, please contact night-coverage [email or web address] Password [email or web address] 02/25/2018, 10:46 AM

## 2018-02-25 NOTE — Progress Notes (Signed)
  Echocardiogram 2D Echocardiogram has been performed.  Corey Guerrero G Teira Arcilla 02/25/2018, 11:56 AM

## 2018-02-26 ENCOUNTER — Encounter (HOSPITAL_COMMUNITY): Payer: Self-pay | Admitting: Physician Assistant

## 2018-02-26 ENCOUNTER — Inpatient Hospital Stay (HOSPITAL_COMMUNITY): Payer: Medicaid Other

## 2018-02-26 DIAGNOSIS — R9431 Abnormal electrocardiogram [ECG] [EKG]: Secondary | ICD-10-CM

## 2018-02-26 DIAGNOSIS — I160 Hypertensive urgency: Secondary | ICD-10-CM

## 2018-02-26 DIAGNOSIS — N179 Acute kidney failure, unspecified: Secondary | ICD-10-CM

## 2018-02-26 LAB — BASIC METABOLIC PANEL
Anion gap: 7 (ref 5–15)
BUN: 23 mg/dL (ref 8–23)
CO2: 26 mmol/L (ref 22–32)
Calcium: 8.8 mg/dL — ABNORMAL LOW (ref 8.9–10.3)
Chloride: 103 mmol/L (ref 98–111)
Creatinine, Ser: 1.9 mg/dL — ABNORMAL HIGH (ref 0.61–1.24)
GFR calc Af Amer: 42 mL/min — ABNORMAL LOW (ref >60)
GFR calc non Af Amer: 36 mL/min — ABNORMAL LOW (ref >60)
Glucose, Bld: 101 mg/dL — ABNORMAL HIGH (ref 70–99)
Potassium: 3.4 mmol/L — ABNORMAL LOW (ref 3.5–5.1)
Sodium: 136 mmol/L (ref 135–145)

## 2018-02-26 MED ORDER — LORAZEPAM 2 MG/ML IJ SOLN
1.0000 mg | Freq: Once | INTRAMUSCULAR | Status: AC
  Administered 2018-02-26: 1 mg via INTRAVENOUS

## 2018-02-26 MED ORDER — LORAZEPAM 2 MG/ML IJ SOLN
INTRAMUSCULAR | Status: AC
  Administered 2018-02-26: 1 mg via INTRAVENOUS
  Filled 2018-02-26: qty 1

## 2018-02-26 MED ORDER — POTASSIUM CHLORIDE CRYS ER 20 MEQ PO TBCR
40.0000 meq | EXTENDED_RELEASE_TABLET | Freq: Once | ORAL | Status: AC
  Administered 2018-02-26: 40 meq via ORAL
  Filled 2018-02-26: qty 2

## 2018-02-26 MED ORDER — HYDROCODONE-ACETAMINOPHEN 5-325 MG PO TABS
1.0000 | ORAL_TABLET | Freq: Four times a day (QID) | ORAL | Status: DC | PRN
  Administered 2018-02-26 – 2018-02-28 (×7): 2 via ORAL
  Administered 2018-02-28: 1 via ORAL
  Administered 2018-03-01: 2 via ORAL
  Filled 2018-02-26 (×9): qty 2
  Filled 2018-02-26: qty 1

## 2018-02-26 NOTE — Progress Notes (Signed)
PROGRESS NOTE  Corey Guerrero TDV:[id number] DOB: [birth year] DOA: 02/24/2018 PCP: [name], NP  HPI/Recap of past 24 hours: Corey Guerrero is a 62 y.o. male with medical history significant of hypertension, hyperlipidemia who has been noncompliant with his home medications.  Patient has had brittle hypertension on 4 different medications including hydralazine, lisinopril, Norvasc and Aldactone.  He has not been taking the medications for weeks.  He checked his blood pressure when he felt weak generalized aches and headache.  Systolic blood pressure was 230 at the time.  He went to work and was still feeling bad so he decided to come to the emergency room.   In the ER patient was found to have initial blood pressure of 255/148.  He is therefore being admitted with hypertensive urgency.  He denied chest pain but is having headaches and generalized aches.  Denied any neurologic symptoms.  Admitted for hypertensive emergency with acute kidney injury on CKD 3.  02/25/2018: Patient seen and examined at his bedside.  He reports right shoulder pain which is chronic.  Denies any chest pain or dyspnea.  Denies headache or change in vision.  Voltaren gel as needed for musculoskeletal pain.  02/26/2018: Patient seen and examined at his bedside.  He reports severe right shoulder pain.  Nonspecific findings on right shoulder x-ray.  States has had prior steroid injection in that joint.  Orthopedic surgery consulted to further assess.  Denies chest pain or dyspnea.  2D echo done on 02/25/2018 with reports of septal lipomatous hypertrophy.  Cardiology consulted to further assess.  Assessment/Plan: Principal Problem:   Hypertensive urgency, malignant Active Problems:   Hypokalemia   Abnormal EKG   CKD (chronic kidney disease) stage 2, GFR 60-89 ml/min   Bradycardia   Hypertensive emergency   Hypertensive emergency suspect secondary to medication noncompliance, improving Blood pressure on admission to  255/148 with renal involvement AKI on CKD 3 Improving Continue current antihypertensive med regimen Continue to monitor vital signs Currently on amlodipine 10 mg daily, hydralazine 50 mg 3 times daily, lisinopril 40 mg daily, and spironolactone 25 mg daily Salt restriction less than 2 g/day  AKI on CKD 3, improving Baseline creatinine 1.7 with GFR 48 Creatinine on presentation 2.18 Creatinine 1.9 from 2.18 yesterday Continue to avoid nephrotoxic agents/dehydration/ Renal ultrasound unremarkable Continue half-normal saline at 50 cc/h Repeat BMP in the morning  Right AC joint degenerative changes Self-reported has had previous steroid injection in the joint Voltaren gel as needed for mild to moderate pain Tramadol as needed for moderate to severe pain Orthopedic surgery consulted, MRI right shoulder pending  Hypokalemia, resolving Potassium 3.4 from 3.0 yesterday Repleted with KCl 40 mEq once Repeat BMP in the morning  Chronic diastolic CHF Last 2D echo done on 04/19/2015 revealed LVEF 55 to 60% with grade 1 diastolic dysfunction Repeat 2D echo done on 02/25/2018 revealed septal lipomatous hypertrophy with normal LVEF Strict I's and O's Daily weight Salt restriction 2 g/day  Septal lipomatous hypertrophy Cardiology consulted to further assess Management per cardiology  Medication noncompliance Counseled on the importance of medication compliance Continue home antihypertensive medications    Code Status: Full code  Family Communication: None at bedside  Disposition Plan: Home possibly tomorrow 02/27/2018 once cardiology, orthopedic surgery sign off   Consultants:  Cardiology  Orthopedic surgery  Procedures:  None  Antimicrobials:  None  DVT prophylaxis: Subcu heparin 5000 3 times daily   Objective: Vitals:   02/26/18 0326 02/26/18 0416 02/26/18 0826 02/26/18  0904  BP: (!) 168/92 (!) 144/85 (!) 158/94 (!) 158/94  Pulse: (!) 58  89   Resp: 20  (!) 24    Temp: 98.5 F (36.9 C)  98.4 F (36.9 C) 98.4 F (36.9 C)  TempSrc: Oral  Oral Oral  SpO2: 98%  90%   Weight:      Height:        Intake/Output Summary (Last 24 hours) at 02/26/2018 1001 Last data filed at 02/26/2018 0900 Gross per 24 hour  Intake 1668.31 ml  Output 200 ml  Net 1468.31 ml   Filed Weights   02/25/18 0209  Weight: 76.2 kg    Exam:  . General: 62 y.o. year-old male well-developed well-nourished in no acute distress.  Alert and oriented x3. . Cardiovascular: Regular rate and rhythm with no rubs or gallops.  No thyromegaly or JVD noted.  Respiratory: Clear to auscultation with no wheezes or rales. Good inspiratory effort. . Abdomen: Soft nontender nondistended with normal bowel sounds x4 quadrants. . Musculoskeletal: No lower extremity edema. 2/4 pulses in all 4 extremities.  Point tenderness on palpation of the right shoulder joint. Marland Kitchen Psychiatry: Mood is appropriate for condition and setting   Data Reviewed: CBC: Recent Labs  Lab 02/24/18 1501 02/25/18 0326  WBC 5.9 5.6  NEUTROABS 3.7  --   HGB 14.6 15.6  HCT 44.5 47.0  MCV 94.9 92.7  PLT 163 962   Basic Metabolic Panel: Recent Labs  Lab 02/24/18 1501 02/25/18 0326 02/26/18 0356  NA 141 139 136  K 3.2* 3.0* 3.4*  CL 104 101 103  CO2 29 26 26   GLUCOSE 90 106* 101*  BUN 28* 25* 23  CREATININE 2.18* 2.03* 1.90*  CALCIUM 9.0 9.0 8.8*   GFR: Estimated Creatinine Clearance: 36.8 mL/min (A) (by C-G formula based on SCr of 1.9 mg/dL (H)). Liver Function Tests: Recent Labs  Lab 02/25/18 0326  AST 28  ALT 22  ALKPHOS 45  BILITOT 1.0  PROT 6.8  ALBUMIN 3.5   No results for input(s): LIPASE, AMYLASE in the last 168 hours. No results for input(s): AMMONIA in the last 168 hours. Coagulation Profile: No results for input(s): INR, PROTIME in the last 168 hours. Cardiac Enzymes: Recent Labs  Lab 02/24/18 2231 02/25/18 0326 02/25/18 0930  TROPONINI 0.05* 0.07* 0.05*   BNP (last 3  results) No results for input(s): PROBNP in the last 8760 hours. HbA1C: No results for input(s): HGBA1C in the last 72 hours. CBG: No results for input(s): GLUCAP in the last 168 hours. Lipid Profile: No results for input(s): CHOL, HDL, LDLCALC, TRIG, CHOLHDL, LDLDIRECT in the last 72 hours. Thyroid Function Tests: Recent Labs    02/24/18 2231  TSH 2.534   Anemia Panel: No results for input(s): VITAMINB12, FOLATE, FERRITIN, TIBC, IRON, RETICCTPCT in the last 72 hours. Urine analysis:    Component Value Date/Time   COLORURINE STRAW (A) 02/24/2018 1750   APPEARANCEUR CLEAR 02/24/2018 1750   LABSPEC 1.009 02/24/2018 1750   PHURINE 8.0 02/24/2018 1750   GLUCOSEU 50 (A) 02/24/2018 1750   HGBUR NEGATIVE 02/24/2018 1750   BILIRUBINUR NEGATIVE 02/24/2018 1750   BILIRUBINUR neg 06/15/2015 0950   KETONESUR NEGATIVE 02/24/2018 1750   PROTEINUR 100 (A) 02/24/2018 1750   UROBILINOGEN 0.2 06/15/2015 0950   UROBILINOGEN 1.0 03/22/2014 1615   NITRITE NEGATIVE 02/24/2018 1750   LEUKOCYTESUR NEGATIVE 02/24/2018 1750   Sepsis Labs: @LABRCNTIP (procalcitonin:4,lacticidven:4)  ) Recent Results (from the past 240 hour(s))  MRSA PCR Screening  Status: None   Collection Time: 02/25/18  3:02 AM  Result Value Ref Range Status   MRSA by PCR NEGATIVE NEGATIVE Final    Comment:        The GeneXpert MRSA Assay (FDA approved for NASAL specimens only), is one component of a comprehensive MRSA colonization surveillance program. It is not intended to diagnose MRSA infection nor to guide or monitor treatment for MRSA infections. Performed at [facility], [place] [address] [place], [place] [address]       Studies: US Renal  Result Date: 02/25/2018 CLINICAL DATA:  62 year old male with a history acute kidney injury EXAM: RENAL / URINARY TRACT ULTRASOUND COMPLETE COMPARISON:  CT 10/28/2015 FINDINGS: Right Kidney: Length: 9.7 cm. Echogenicity within normal limits. Flow confirmed in the  hilum of the left kidney. No hydronephrosis. Left Kidney: Length: 9.7 cm. Echogenicity within normal limits. No hydronephrosis. Flow confirmed in the hilum of the left kidney. Anechoic cystic structure with through transmission and no internal complexity measures 1 cm x 1 cm x 8 mm. Bladder: Appears normal for degree of bladder distention. Prostate appears enlarged with greatest transverse diameter 6.7 cm IMPRESSION: Sonographic survey negative for hydronephrosis. Bosniak 1 cyst of the left kidney. Prostatomegaly Electronically Signed   By: Corrie Mckusick D.O.   On: 02/25/2018 13:46    Scheduled Meds: . amLODipine  10 mg Oral Daily  . aspirin  81 mg Oral Daily  . B-complex with vitamin C  1 tablet Oral Daily  . diclofenac sodium  4 g Topical QID  . heparin  5,000 Units Subcutaneous Q8H  . hydrALAZINE  50 mg Oral TID  . lisinopril  40 mg Oral Daily  . omega-3 acid ethyl esters  1 capsule Oral Daily  . vitamin B-6  50 mg Oral Daily  . spironolactone  25 mg Oral Daily  . thiamine  100 mg Oral Daily  . vitamin B-12  100 mcg Oral Daily    Continuous Infusions: . sodium chloride Stopped (02/25/18 1928)     LOS: 2 days     Kayleen Memos, MD [facility] Pager [phone]  If 7PM-7AM, please contact night-coverage [email or web address] Password [email or web address] 02/26/2018, 10:01 AM
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
## 2018-02-26 NOTE — Plan of Care (Signed)

## 2018-02-27 LAB — BASIC METABOLIC PANEL
Anion gap: 7 (ref 5–15)
BUN: 20 mg/dL (ref 8–23)
CO2: 26 mmol/L (ref 22–32)
Calcium: 9.1 mg/dL (ref 8.9–10.3)
Chloride: 100 mmol/L (ref 98–111)
Creatinine, Ser: 1.81 mg/dL — ABNORMAL HIGH (ref 0.61–1.24)
GFR calc Af Amer: 45 mL/min — ABNORMAL LOW (ref >60)
GFR calc non Af Amer: 39 mL/min — ABNORMAL LOW (ref >60)
Glucose, Bld: 106 mg/dL — ABNORMAL HIGH (ref 70–99)
Potassium: 3.7 mmol/L (ref 3.5–5.1)
Sodium: 133 mmol/L — ABNORMAL LOW (ref 135–145)

## 2018-02-27 MED ORDER — METHYLPREDNISOLONE ACETATE 40 MG/ML IJ SUSP
40.0000 mg | Freq: Once | INTRAMUSCULAR | Status: AC
  Administered 2018-02-27: 40 mg via INTRA_ARTICULAR
  Filled 2018-02-27 (×2): qty 1

## 2018-02-27 MED ORDER — BUPIVACAINE HCL (PF) 0.5 % IJ SOLN
10.0000 mL | Freq: Once | INTRAMUSCULAR | Status: AC
  Administered 2018-02-27: 10 mL
  Filled 2018-02-27 (×2): qty 10

## 2018-02-27 MED ORDER — SPIRONOLACTONE 25 MG PO TABS
50.0000 mg | ORAL_TABLET | Freq: Two times a day (BID) | ORAL | Status: DC
  Administered 2018-02-27 – 2018-03-01 (×4): 50 mg via ORAL
  Filled 2018-02-27 (×4): qty 2

## 2018-02-27 MED ORDER — SPIRONOLACTONE 25 MG PO TABS
50.0000 mg | ORAL_TABLET | Freq: Every day | ORAL | Status: DC
  Administered 2018-02-27: 50 mg via ORAL
  Filled 2018-02-27 (×2): qty 2

## 2018-02-27 MED ORDER — HYDRALAZINE HCL 50 MG PO TABS
100.0000 mg | ORAL_TABLET | Freq: Three times a day (TID) | ORAL | Status: DC
  Administered 2018-02-27 – 2018-03-01 (×8): 100 mg via ORAL
  Filled 2018-02-27 (×8): qty 2

## 2018-02-27 MED ORDER — METOPROLOL TARTRATE 50 MG PO TABS
50.0000 mg | ORAL_TABLET | Freq: Two times a day (BID) | ORAL | Status: DC
  Administered 2018-02-27 – 2018-02-28 (×3): 50 mg via ORAL
  Filled 2018-02-27 (×4): qty 1

## 2018-02-27 NOTE — Progress Notes (Addendum)
Progress Note  Patient Name: Corey Guerrero Date of Encounter: 02/27/2018  Primary Cardiologist: Shelva Majestic, MD   Subjective   Only pain in Rt shoulder.  Severe.  Plans to inject tomorrow. No SOB and no chest pain.    Inpatient Medications    Scheduled Meds: . amLODipine  10 mg Oral Daily  . aspirin  81 mg Oral Daily  . B-complex with vitamin C  1 tablet Oral Daily  . bupivacaine  10 mL Infiltration Once  . diclofenac sodium  4 g Topical QID  . heparin  5,000 Units Subcutaneous Q8H  . hydrALAZINE  100 mg Oral TID  . lisinopril  40 mg Oral Daily  . methylPREDNISolone acetate  40 mg Intra-articular Once  . omega-3 acid ethyl esters  1 capsule Oral Daily  . vitamin B-6  50 mg Oral Daily  . spironolactone  50 mg Oral Daily  . thiamine  100 mg Oral Daily  . vitamin B-12  100 mcg Oral Daily   Continuous Infusions:  PRN Meds: acetaminophen, diphenhydrAMINE, hydrALAZINE, HYDROcodone-acetaminophen, ondansetron **OR** ondansetron (ZOFRAN) IV, traMADol   Vital Signs    Vitals:   02/26/18 2335 02/27/18 0305 02/27/18 0320 02/27/18 0534  BP: (!) 148/86 (!) 177/104 (!) 184/104 (!) 157/88  Pulse:  72    Resp: 19 19 17    Temp:  98.5 F (36.9 C)    TempSrc:  Oral    SpO2:  97%    Weight:      Height:        Intake/Output Summary (Last 24 hours) at 02/27/2018 1030 Last data filed at 02/27/2018 0000 Gross per 24 hour  Intake 0 ml  Output -  Net 0 ml   Filed Weights   02/25/18 0209  Weight: 76.2 kg    Telemetry    SR with occ PVC - Personally Reviewed  ECG    No new - Personally Reviewed  Physical Exam   GEN: No acute distress.   Neck: No JVD Cardiac: RRR, no murmurs, rubs, or gallops.  Respiratory: Clear to auscultation bilaterally. GI: Soft, nontender, non-distended  MS: No edema; No deformity. + rt shoulder pain from torn rotator cuff.  Neuro:  Nonfocal  Psych: Normal affect  Skin:   Papular rash R bicep region around to back of arm  Labs      Chemistry Recent Labs  Lab 02/25/18 0326 02/26/18 0356 02/27/18 0239  NA 139 136 133*  K 3.0* 3.4* 3.7  CL 101 103 100  CO2 26 26 26   GLUCOSE 106* 101* 106*  BUN 25* 23 20  CREATININE 2.03* 1.90* 1.81*  CALCIUM 9.0 8.8* 9.1  PROT 6.8  --   --   ALBUMIN 3.5  --   --   AST 28  --   --   ALT 22  --   --   ALKPHOS 45  --   --   BILITOT 1.0  --   --   GFRNONAA 34* 36* 39*  GFRAA 39* 42* 45*  ANIONGAP 12 7 7      Hematology Recent Labs  Lab 02/24/18 1501 02/25/18 0326  WBC 5.9 5.6  RBC 4.69 5.07  HGB 14.6 15.6  HCT 44.5 47.0  MCV 94.9 92.7  MCH 31.1 30.8  MCHC 32.8 33.2  RDW 12.4 12.5  PLT 163 165    Cardiac Enzymes Recent Labs  Lab 02/24/18 2231 02/25/18 0326 02/25/18 0930  TROPONINI 0.05* 0.07* 0.05*   No results for input(s):  TROPIPOC in the last 168 hours.   BNPNo results for input(s): BNP, PROBNP in the last 168 hours.   DDimer No results for input(s): DDIMER in the last 168 hours.   Radiology    US Renal  Result Date: 02/25/2018 CLINICAL DATA:  62 year old male with a history acute kidney injury EXAM: RENAL / URINARY TRACT ULTRASOUND COMPLETE COMPARISON:  CT 10/28/2015 FINDINGS: Right Kidney: Length: 9.7 cm. Echogenicity within normal limits. Flow confirmed in the hilum of the left kidney. No hydronephrosis. Left Kidney: Length: 9.7 cm. Echogenicity within normal limits. No hydronephrosis. Flow confirmed in the hilum of the left kidney. Anechoic cystic structure with through transmission and no internal complexity measures 1 cm x 1 cm x 8 mm. Bladder: Appears normal for degree of bladder distention. Prostate appears enlarged with greatest transverse diameter 6.7 cm IMPRESSION: Sonographic survey negative for hydronephrosis. Bosniak 1 cyst of the left kidney. Prostatomegaly Electronically Signed   By: Corrie Mckusick D.O.   On: 02/25/2018 13:46   Mr Shoulder Right Wo Contrast  Result Date: 02/26/2018 CLINICAL DATA:  Chronic right shoulder pain. History of  prior steroid injections. Rule out internal derangement. EXAM: MRI OF THE RIGHT SHOULDER WITHOUT CONTRAST TECHNIQUE: Multiplanar, multisequence MR imaging of the shoulder was performed. No intravenous contrast was administered. COMPARISON:  None. FINDINGS: Rotator cuff: Partial articular surface tear involving the anterior fibers of the supraspinatus tendon, near the insertion measuring 4 x 9 mm coronal oblique by AP. Articular surface fraying of the myotendinous junction of the subscapularis with subscapularis tendinosis. Intact teres minor and infraspinatus. Muscles:  No muscle atrophy. Biceps long head: Tendinosis of the horizontal portion. Intact biceps anchor and tendon. Acromioclavicular Joint: Moderate arthropathy of the acromioclavicular joint with trace joint fluid and reactive edema noted remote. Type II curved acromion. Small amount of subacromial and subdeltoid bursal fluid. Glenohumeral Joint: No focal chondral defects.  No joint effusion. Labrum:  Intact Bones: Degenerative subcortical cystic change of the superolateral humeral head involving the greater tuberosity. No fracture or suspicious osseous lesions. Other: None IMPRESSION: 1. Partial articular surface tear involving the anterior fibers of the supraspinatus tendon, near the insertion. 2. Tendinosis of the subscapularis with partial articular surface fraying along the myotendinous juncture. 3. Subacromial and subdeltoid bursitis. 4. Biceps tendinosis. 5. AC joint osteoarthritis. Subcortical degenerative cystic change of the superolateral humeral head. Electronically Signed   By: Ashley Royalty M.D.   On: 02/26/2018 17:09    Cardiac Studies   02/25/18 echo  Study Conclusions  - Left ventricle: The cavity size was normal. There was moderate   concentric hypertrophy. Systolic function was normal. The   estimated ejection fraction was in the range of 60% to 65%. Wall   motion was normal; there were no regional wall motion   abnormalities.  Features are consistent with a pseudonormal left   ventricular filling pattern, with concomitant abnormal relaxation   and increased filling pressure (grade 2 diastolic dysfunction).   Doppler parameters are consistent with high ventricular filling   pressure. - Left atrium: The atrium was mildly dilated. - Atrial septum: There was increased thickness of the septum,   consistent with lipomatous hypertrophy.  Patient Profile     62 y.o. male with a hx of HTN, HLD, CKD III, non-cardiac CP 2016, med non-compliance.  Now admitted with elevated BP and not taking his meds. BP on arrival 255/148.   Assessment & Plan    Abnormal EKG though with LVH not much different than usual.  Troponin 0.05 to 0.07 prob due to severe HTN and demand  There is no plan for ischemic workup as pt very active and no chest pain.    HTN poorly controlled with pt not taking meds.   --today 171/108;  162/97; 146/96  Work up in [place] pending   Will sched Renal artery USN  Will advance meds (increase aldactone, add b blocker) Follow   Stressed the importance of long term BP control  Hypokalemia possible hyperaldosteronism.  Labs pending  --today K+ 3.7   CKD - 3 with Cr 1.81 today    Rt shoulder pain  -- Rotator cuff: Partial articular surface tear involving the anterior fibers of the supraspinatus tendon, near the insertion measuring 4 x 9 mm coronal oblique by AP for injection.    Rash   Rash on R arm   ? Shingles  Pts says he never had chicken pox   Will discuss with hospitalist  For questions or updates, please contact [place] Please consult [email or web address] for contact info under Cardiology/STEMI.      Signed, Cecilie Kicks, NP  02/27/2018, 10:30 AM    Pt seen and examined   I have amended note above by L ingold to reflect my findings  PT comfortable   No CP   R shoulder injected Lungs CTA  Cardiac RRR   No S3 or murmurs Ext without edema    Rash on R arm     BP is still high   I have  advanced meds   Blood work pending   I also sched Renal artery duplex   Rash   ? Shingles   Appears to follow a dermatome   Will notify hospitalist.  May be too long to do anything with Rx     Dorris Carnes

## 2018-02-27 NOTE — Progress Notes (Signed)
PROGRESS NOTE  KMARI HALTER JHE:[id number] DOB: 1956-03-26 DOA: 02/24/2018 PCP: [name], NP  HPI/Recap of past 24 hours: ADONIS YIM is a 62 y.o. male with medical history significant of hypertension, hyperlipidemia who has been noncompliant with his home medications.  Patient has had brittle hypertension on 4 different medications including hydralazine, lisinopril, Norvasc and Aldactone.  He has not been taking the medications for weeks.  He checked his blood pressure when he felt weak generalized aches and headache.  Systolic blood pressure was 230 at the time.  He went to work and was still feeling bad so he decided to come to the emergency room.   In the ER patient was found to have initial blood pressure of 255/148.  Admitted for hypertensive emergency with acute kidney injury on CKD 3.  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  02/27/2018: Patient seen and examined at his bedside.  Only complaint is that of right shoulder pain prior to intra-articular steroid injection.  Moderate to severe sharp pain 8-10 out of 10 worse with movements.  Assessment/Plan: Principal Problem:   Hypertensive urgency, malignant Active Problems:   Hypokalemia   Abnormal EKG   CKD (chronic kidney disease) stage 2, GFR 60-89 ml/min   Bradycardia   Hypertensive emergency   Hypertensive emergency/malignant hypertension secondary to medication noncompliance versus others Blood pressure on admission to 255/148 with renal involvement AKI on CKD 3 Increased medications doses Cardiology added metoprolol Currently on amlodipine 10 mg daily, hydralazine 100 mg 3 times daily, lisinopril 40  mg daily and Spironolactone 50 mg twice daily and metoprolol tartrate 50 mg twice daily Hyperaldosteronism work up per cardiology pending Renal artery duplex ultrasound per cardiology pending Salt restriction less than 2 g/day  AKI on CKD 3, improving Baseline creatinine 1.7 with GFR 48 Creatinine on presentation 2.18 Creatinine 1.8 from 1.9 from 2.18  Continue to avoid nephrotoxic agents/dehydration/ Renal ultrasound unremarkable Stop IV fluids, was previously on half-normal saline at 50 cc an hour Repeat BMP in the morning  Right supraspinatus tear/right AC joint OA Self-reported has had previous steroid injection in the joint Received intra-articular steroid injection by orthopedic surgery on 02/27/2018 Voltaren gel as needed for mild to moderate pain Tylenol 3 for 3 times daily x5 days as recommended by orthopedic surgery  Hypokalemia, resolved Post repletion with p.o. KCl and added IV magnesium  Chronic diastolic CHF Last 2D echo done on 04/19/2015 revealed LVEF 55 to 60% with grade 1 diastolic dysfunction Repeat 2D echo done on 02/25/2018 revealed septal lipomatous hypertrophy with normal LVEF Strict I's and O's Daily weight Salt restriction 2 g/day  Septal lipomatous hypertrophy Cardiology consulted to further assess Management per cardiology  Medication noncompliance Counseled on the importance of medication compliance Continue home antihypertensive medications    Code Status: Full code  Family Communication: None at bedside  Disposition Plan: Home possibly tomorrow 02/28/2018 when cardiology signs off and blood pressure is better controlled.   Consultants:  Cardiology  Orthopedic surgery  Procedures:  None  Antimicrobials:  None  DVT prophylaxis: Subcu heparin 5000 3 times daily   Objective: Vitals:   02/27/18 1000 02/27/18 1100 02/27/18 1127 02/27/18 1200  BP:      Pulse:   69   Resp: 18  14  (!) 22  Temp:   98.6 F (37 C)   TempSrc:    Oral   SpO2:   97%   Weight:      Height:        Intake/Output Summary (Last 24 hours) at 02/27/2018 1448 Last data filed at 02/27/2018 1345 Gross per 24 hour  Intake 240 ml  Output -  Net 240 ml   Filed Weights   02/25/18 0209  Weight: 76.2 kg    Exam:  . General: 61 y.o. year-old male well-developed well-nourished appears uncomfortable due to right shoulder pain.  Alert and oriented x3.   . Cardiovascular: Regular rate and rhythm with no rubs or gallops.  No thyromegaly or JVD noted.  Respiratory: Clear to auscultation with no wheezes or rales. Good inspiratory effort. . Abdomen: Soft nontender nondistended with normal bowel sounds x4 quadrants. . Musculoskeletal: No lower extremity edema. 2/4 pulses in all 4 extremities.  Point tenderness on palpation of the right shoulder joint. Marland Kitchen Psychiatry: Mood is appropriate for condition and setting   Data Reviewed: CBC: Recent Labs  Lab 02/24/18 1501 02/25/18 0326  WBC 5.9 5.6  NEUTROABS 3.7  --   HGB 14.6 15.6  HCT 44.5 47.0  MCV 94.9 92.7  PLT 163 417   Basic Metabolic Panel: Recent Labs  Lab 02/24/18 1501 02/25/18 0326 02/26/18 0356 02/27/18 0239  NA 141 139 136 133*  K 3.2* 3.0* 3.4* 3.7  CL 104 101 103 100  CO2 29 26 26 26   GLUCOSE 90 106* 101* 106*  BUN 28* 25* 23 20  CREATININE 2.18* 2.03* 1.90* 1.81*  CALCIUM 9.0 9.0 8.8* 9.1   GFR: Estimated Creatinine Clearance: 38.7 mL/min (A) (by C-G formula based on SCr of 1.81 mg/dL (H)). Liver Function Tests: Recent Labs  Lab 02/25/18 0326  AST 28  ALT 22  ALKPHOS 45  BILITOT 1.0  PROT 6.8  ALBUMIN 3.5   No results for input(s): LIPASE, AMYLASE in the last 168 hours. No results for input(s): AMMONIA in the last 168 hours. Coagulation Profile: No results for input(s): INR, PROTIME in the last 168 hours. Cardiac Enzymes: Recent Labs  Lab 02/24/18 2231 02/25/18 0326 02/25/18 0930  TROPONINI 0.05* 0.07* 0.05*   BNP (last 3 results) No results for  input(s): PROBNP in the last 8760 hours. HbA1C: No results for input(s): HGBA1C in the last 72 hours. CBG: No results for input(s): GLUCAP in the last 168 hours. Lipid Profile: No results for input(s): CHOL, HDL, LDLCALC, TRIG, CHOLHDL, LDLDIRECT in the last 72 hours. Thyroid Function Tests: Recent Labs    02/24/18 2231  TSH 2.534   Anemia Panel: No results for input(s): VITAMINB12, FOLATE, FERRITIN, TIBC, IRON, RETICCTPCT in the last 72 hours. Urine analysis:    Component Value Date/Time   COLORURINE STRAW (A) 02/24/2018 1750   APPEARANCEUR CLEAR 02/24/2018 1750   LABSPEC 1.009 02/24/2018 1750   PHURINE 8.0 02/24/2018 1750   GLUCOSEU 50 (A) 02/24/2018 1750   HGBUR NEGATIVE 02/24/2018 1750   BILIRUBINUR NEGATIVE 02/24/2018 1750   BILIRUBINUR neg 06/15/2015 0950   KETONESUR NEGATIVE 02/24/2018 1750   PROTEINUR 100 (A) 02/24/2018 1750   UROBILINOGEN 0.2 06/15/2015 0950   UROBILINOGEN 1.0 03/22/2014 1615   NITRITE NEGATIVE 02/24/2018 1750   LEUKOCYTESUR NEGATIVE 02/24/2018 1750   Sepsis Labs: @LABRCNTIP (procalcitonin:4,lacticidven:4)  ) Recent Results (from the past 240 hour(s))  MRSA PCR Screening     Status: None   Collection Time: 02/25/18  3:02 AM  Result Value Ref Range Status   MRSA by PCR NEGATIVE NEGATIVE Final    Comment:        The GeneXpert MRSA Assay (FDA approved for NASAL specimens only), is one component of a comprehensive MRSA colonization surveillance program. It is not intended to diagnose MRSA infection nor to guide or monitor treatment for MRSA infections. Performed at [facility], [place] [address] [place], [place] [address]       Studies: No results found.  Scheduled Meds: . amLODipine  10 mg Oral Daily  . aspirin  81 mg Oral Daily  . B-complex with vitamin C  1 tablet Oral Daily  . diclofenac sodium  4 g Topical QID  . heparin  5,000 Units Subcutaneous Q8H  . hydrALAZINE  100 mg Oral TID  . lisinopril  40 mg Oral Daily  .  metoprolol tartrate  50 mg Oral BID  . omega-3 acid ethyl esters  1 capsule Oral Daily  . vitamin B-6  50 mg Oral Daily  . spironolactone  50 mg Oral BID  . thiamine  100 mg Oral Daily  . vitamin B-12  100 mcg Oral Daily    Continuous Infusions:    LOS: 3 days     Kayleen Memos, MD [facility] Pager [phone]  If 7PM-7AM, please contact night-coverage [email or web address] Password [email or web address] 02/27/2018, 2:48 PM

## 2018-02-27 NOTE — Plan of Care (Signed)
Problem: Education: Goal: Knowledge of General Education information will improve Description Including pain rating scale, medication(s)/side effects and non-pharmacologic comfort measures Outcome: Progressing   Problem: Health Behavior/Discharge Planning: Goal: Ability to manage health-related needs will improve Outcome: Progressing   Problem: Clinical Measurements: Goal: Ability to maintain clinical measurements within normal limits will improve Outcome: Progressing Goal: Will remain free from infection Outcome: Progressing Goal: Diagnostic test results will improve Outcome: Progressing Goal: Respiratory complications will improve Outcome: Progressing Goal: Cardiovascular complication will be avoided Outcome: Progressing   Problem: Activity: Goal: Risk for activity intolerance will decrease Outcome: Progressing   Problem: Nutrition: Goal: Adequate nutrition will be maintained Outcome: Progressing   Problem: Coping: Goal: Level of anxiety will decrease Outcome: Progressing   Problem: Elimination: Goal: Will not experience complications related to bowel motility Outcome: Progressing Goal: Will not experience complications related to urinary retention Outcome: Progressing   Problem: Pain Managment: Goal: General experience of comfort will improve Outcome: Progressing   Problem: Safety: Goal: Ability to remain free from injury will improve Outcome: Progressing   Problem: Skin Integrity: Goal: Risk for impaired skin integrity will decrease Outcome: Progressing   Problem: Education: Goal: Knowledge of General Education information will improve Description Including pain rating scale, medication(s)/side effects and non-pharmacologic comfort measures 02/27/2018 1531 by Jeanne Ivan, RN Outcome: Progressing 02/27/2018 1531 by Jeanne Ivan, RN Outcome: Progressing   Problem: Health Behavior/Discharge Planning: Goal: Ability to manage health-related needs  will improve 02/27/2018 1531 by Jeanne Ivan, RN Outcome: Progressing 02/27/2018 1531 by Jeanne Ivan, RN Outcome: Progressing   Problem: Clinical Measurements: Goal: Ability to maintain clinical measurements within normal limits will improve 02/27/2018 1531 by Jeanne Ivan, RN Outcome: Progressing 02/27/2018 1531 by Jeanne Ivan, RN Outcome: Progressing Goal: Will remain free from infection 02/27/2018 1531 by Jeanne Ivan, RN Outcome: Progressing 02/27/2018 1531 by Jeanne Ivan, RN Outcome: Progressing Goal: Diagnostic test results will improve 02/27/2018 1531 by Jeanne Ivan, RN Outcome: Progressing 02/27/2018 1531 by Jeanne Ivan, RN Outcome: Progressing Goal: Respiratory complications will improve 02/27/2018 1531 by Jeanne Ivan, RN Outcome: Progressing 02/27/2018 1531 by Jeanne Ivan, RN Outcome: Progressing Goal: Cardiovascular complication will be avoided 02/27/2018 1531 by Jeanne Ivan, RN Outcome: Progressing 02/27/2018 1531 by Jeanne Ivan, RN Outcome: Progressing   Problem: Activity: Goal: Risk for activity intolerance will decrease 02/27/2018 1531 by Jeanne Ivan, RN Outcome: Progressing 02/27/2018 1531 by Jeanne Ivan, RN Outcome: Progressing   Problem: Nutrition: Goal: Adequate nutrition will be maintained 02/27/2018 1531 by Jeanne Ivan, RN Outcome: Progressing 02/27/2018 1531 by Jeanne Ivan, RN Outcome: Progressing   Problem: Coping: Goal: Level of anxiety will decrease 02/27/2018 1531 by Jeanne Ivan, RN Outcome: Progressing 02/27/2018 1531 by Jeanne Ivan, RN Outcome: Progressing   Problem: Elimination: Goal: Will not experience complications related to bowel motility 02/27/2018 1531 by Jeanne Ivan, RN Outcome: Progressing 02/27/2018 1531 by Jeanne Ivan, RN Outcome: Progressing Goal: Will not experience complications related to urinary retention 02/27/2018 1531 by Jeanne Ivan, RN Outcome:  Progressing 02/27/2018 1531 by Jeanne Ivan, RN Outcome: Progressing   Problem: Pain Managment: Goal: General experience of comfort will improve 02/27/2018 1531 by Jeanne Ivan, RN Outcome: Progressing 02/27/2018 1531 by Jeanne Ivan, RN Outcome: Progressing   Problem: Safety: Goal: Ability to remain free from injury will improve 02/27/2018 1531 by Jeanne Ivan, RN Outcome: Progressing 02/27/2018 1531 by Jeanne Ivan,  RN Outcome: Progressing   Problem: Skin Integrity: Goal: Risk for impaired skin integrity will decrease 02/27/2018 1531 by Jeanne Ivan, RN Outcome: Progressing 02/27/2018 1531 by Jeanne Ivan, RN Outcome: Progressing

## 2018-02-28 DIAGNOSIS — Z9114 Patient's other noncompliance with medication regimen: Secondary | ICD-10-CM

## 2018-02-28 DIAGNOSIS — N183 Chronic kidney disease, stage 3 unspecified: Secondary | ICD-10-CM

## 2018-02-28 DIAGNOSIS — I10 Essential (primary) hypertension: Secondary | ICD-10-CM

## 2018-02-28 DIAGNOSIS — Z91148 Patient's other noncompliance with medication regimen for other reason: Secondary | ICD-10-CM

## 2018-02-28 LAB — BASIC METABOLIC PANEL
Anion gap: 10 (ref 5–15)
BUN: 28 mg/dL — ABNORMAL HIGH (ref 8–23)
CO2: 25 mmol/L (ref 22–32)
Calcium: 9.3 mg/dL (ref 8.9–10.3)
Chloride: 98 mmol/L (ref 98–111)
Creatinine, Ser: 1.9 mg/dL — ABNORMAL HIGH (ref 0.61–1.24)
GFR calc Af Amer: 42 mL/min — ABNORMAL LOW (ref >60)
GFR calc non Af Amer: 36 mL/min — ABNORMAL LOW (ref >60)
Glucose, Bld: 112 mg/dL — ABNORMAL HIGH (ref 70–99)
Potassium: 4.7 mmol/L (ref 3.5–5.1)
Sodium: 133 mmol/L — ABNORMAL LOW (ref 135–145)

## 2018-02-28 MED ORDER — CALAMINE EX LOTN
TOPICAL_LOTION | Freq: Two times a day (BID) | CUTANEOUS | Status: DC
  Administered 2018-02-28: 1 via TOPICAL
  Administered 2018-03-01: 10:00:00 via TOPICAL
  Filled 2018-02-28: qty 177

## 2018-02-28 NOTE — Progress Notes (Signed)
Progress Note  Patient Name: Corey Guerrero Date of Encounter: 02/28/2018  Primary Cardiologist: Dr. Dorris Carnes  Subjective   No chest pain or shortness of breath.  Still has residual right shoulder discomfort although had an injection by orthopedics.   Inpatient Medications    Scheduled Meds: . amLODipine  10 mg Oral Daily  . aspirin  81 mg Oral Daily  . B-complex with vitamin C  1 tablet Oral Daily  . diclofenac sodium  4 g Topical QID  . heparin  5,000 Units Subcutaneous Q8H  . hydrALAZINE  100 mg Oral TID  . lisinopril  40 mg Oral Daily  . metoprolol tartrate  50 mg Oral BID  . omega-3 acid ethyl esters  1 capsule Oral Daily  . vitamin B-6  50 mg Oral Daily  . spironolactone  50 mg Oral BID  . thiamine  100 mg Oral Daily  . vitamin B-12  100 mcg Oral Daily    PRN Meds: acetaminophen, diphenhydrAMINE, hydrALAZINE, HYDROcodone-acetaminophen, ondansetron **OR** ondansetron (ZOFRAN) IV, traMADol   Vital Signs    Vitals:   02/27/18 1913 02/27/18 2136 02/27/18 2327 02/28/18 0329  BP: 135/83 129/85 124/78 (!) 154/84  Pulse: 65  (!) 56 (!) 58  Resp: 16  19 19   Temp: 98.5 F (36.9 C)  98.3 F (36.8 C) 98.3 F (36.8 C)  TempSrc: Oral  Oral Oral  SpO2: 96%  97% 97%  Weight:      Height:        Intake/Output Summary (Last 24 hours) at 02/28/2018 1008 Last data filed at 02/27/2018 1345 Gross per 24 hour  Intake 240 ml  Output -  Net 240 ml   Filed Weights   02/25/18 0209  Weight: 76.2 kg    Telemetry    Sinus rhythm.  Personally reviewed.  ECG    Tracing from 02/24/2018 shows sinus rhythm with LVH and repolarization abnormalities, prolonged QT.  Personally reviewed.  Physical Exam   GEN: No acute distress.   Neck: No JVD. Cardiac: RRR, 2/6 systolic murmur, no gallop.  Respiratory: Nonlabored. Clear to auscultation bilaterally. GI: Soft, nontender, bowel sounds present. MS: No edema; No deformity. Neuro:  Nonfocal. Psych: Alert and oriented x 3.  Normal affect.  Labs    Chemistry Recent Labs  Lab 02/25/18 0326 02/26/18 0356 02/27/18 0239 02/28/18 0250  NA 139 136 133* 133*  K 3.0* 3.4* 3.7 4.7  CL 101 103 100 98  CO2 26 26 26 25   GLUCOSE 106* 101* 106* 112*  BUN 25* 23 20 28*  CREATININE 2.03* 1.90* 1.81* 1.90*  CALCIUM 9.0 8.8* 9.1 9.3  PROT 6.8  --   --   --   ALBUMIN 3.5  --   --   --   AST 28  --   --   --   ALT 22  --   --   --   ALKPHOS 45  --   --   --   BILITOT 1.0  --   --   --   GFRNONAA 34* 36* 39* 36*  GFRAA 39* 42* 45* 42*  ANIONGAP 12 7 7 10      Hematology Recent Labs  Lab 02/24/18 1501 02/25/18 0326  WBC 5.9 5.6  RBC 4.69 5.07  HGB 14.6 15.6  HCT 44.5 47.0  MCV 94.9 92.7  MCH 31.1 30.8  MCHC 32.8 33.2  RDW 12.4 12.5  PLT 163 165    Cardiac Enzymes Recent Labs  Lab  02/24/18 2231 02/25/18 0326 02/25/18 0930  TROPONINI 0.05* 0.07* 0.05*   No results for input(s): TROPIPOC in the last 168 hours.   Radiology    Mr Shoulder Right Wo Contrast  Result Date: 02/26/2018 CLINICAL DATA:  Chronic right shoulder pain. History of prior steroid injections. Rule out internal derangement. EXAM: MRI OF THE RIGHT SHOULDER WITHOUT CONTRAST TECHNIQUE: Multiplanar, multisequence MR imaging of the shoulder was performed. No intravenous contrast was administered. COMPARISON:  None. FINDINGS: Rotator cuff: Partial articular surface tear involving the anterior fibers of the supraspinatus tendon, near the insertion measuring 4 x 9 mm coronal oblique by AP. Articular surface fraying of the myotendinous junction of the subscapularis with subscapularis tendinosis. Intact teres minor and infraspinatus. Muscles:  No muscle atrophy. Biceps long head: Tendinosis of the horizontal portion. Intact biceps anchor and tendon. Acromioclavicular Joint: Moderate arthropathy of the acromioclavicular joint with trace joint fluid and reactive edema noted remote. Type II curved acromion. Small amount of subacromial and subdeltoid  bursal fluid. Glenohumeral Joint: No focal chondral defects.  No joint effusion. Labrum:  Intact Bones: Degenerative subcortical cystic change of the superolateral humeral head involving the greater tuberosity. No fracture or suspicious osseous lesions. Other: None IMPRESSION: 1. Partial articular surface tear involving the anterior fibers of the supraspinatus tendon, near the insertion. 2. Tendinosis of the subscapularis with partial articular surface fraying along the myotendinous juncture. 3. Subacromial and subdeltoid bursitis. 4. Biceps tendinosis. 5. AC joint osteoarthritis. Subcortical degenerative cystic change of the superolateral humeral head. Electronically Signed   By: Ashley Royalty M.D.   On: 02/26/2018 17:09    Cardiac Studies   Echocardiogram 02/25/2018: Study Conclusions  - Left ventricle: The cavity size was normal. There was moderate   concentric hypertrophy. Systolic function was normal. The   estimated ejection fraction was in the range of 60% to 65%. Wall   motion was normal; there were no regional wall motion   abnormalities. Features are consistent with a pseudonormal left   ventricular filling pattern, with concomitant abnormal relaxation   and increased filling pressure (grade 2 diastolic dysfunction).   Doppler parameters are consistent with high ventricular filling   pressure. - Left atrium: The atrium was mildly dilated. - Atrial septum: There was increased thickness of the septum,   consistent with lipomatous hypertrophy.  Patient Profile     62 y.o. male with a history of poorly controlled hypertension, hyperlipidemia, CKD stage III, and medication noncompliance.  He is admitted with uncontrolled hypertension and mild troponin I elevations with chronically abnormal ECG.  Assessment & Plan    1.  Probable essential hypertension, poorly controlled and complicated by medication noncompliance.  He is back on multimodal therapy with better blood pressure control.   Renal artery duplex imaging also pending to exclude RAS.  2.  Chronically abnormal ECG.  Likely associated with hypertensive heart disease and LVH.  3.  Minor troponin elevations of 0.05-0.07.  Flat pattern is not consistent with ACS.  Suspect demand ischemia in the setting of uncontrolled hypertension.  4.  Hypokalemia, resolved.  5. CKD, stage 3.  Creatinine 1.90.  6.  Lipomatous hypertrophy of the interatrial septum by recent echocardiogram (specifically questioned in IM note).  This is a benign finding of no major clinical significance.  Current anti-hypertensive regimen includes Norvasc, hydralazine, lisinopril, Lopressor, and Aldactone.  No major changes anticipated at this time, this can be followed up as an outpatient.  Importance of medication compliance was stressed.  No ischemic work-up is  planned with minor troponin elevations most likely reflective of demand ischemia.  ECG is chronically abnormal as noted above.  He is ordered for renal artery duplex imaging to exclude RAS.  If this study is significantly abnormal, we can have him evaluated by our vascular division as an outpatient.  [facility] will sign off.   Medication Recommendations: Continue current antihypertensive regimen as discussed above at current doses. Other recommendations (labs, testing, etc): Follow-up on renal artery duplex imaging.  Is significantly abnormal, he can be further evaluated by our vascular division as an outpatient. Follow up as an outpatient: Please schedule follow-up with Dr. [name] or APP in the next 2 to 3 weeks.  Signed, Rozann Lesches, MD  02/28/2018, 10:08 AM

## 2018-02-28 NOTE — Progress Notes (Addendum)
PROGRESS NOTE  Corey Guerrero RWE:[id number] DOB: 1956-05-22 DOA: 02/24/2018 PCP: [name], NP  HPI/Recap of past 24 hours: Corey Guerrero is a 62 y.o. male with medical history significant of hypertension, hyperlipidemia who has been noncompliant with his home medications.  Patient has had brittle hypertension on 4 different medications including hydralazine, lisinopril, Norvasc and Aldactone.  He has not been taking the medications for weeks.  He checked his blood pressure when he felt weak generalized aches and headache.  Systolic blood pressure was 230 at the time.  He went to work and was still feeling bad so he decided to come to the emergency room.   In the ER patient was found to have initial blood pressure of 255/148.  He is therefore being admitted with hypertensive urgency.  He denied chest pain but is having headaches and generalized aches.  Denied any neurologic symptoms.  Admitted for hypertensive emergency with acute kidney injury on CKD 3.  02/25/2018: Patient seen and examined at his bedside.  He reports right shoulder pain which is chronic.  Denies any chest pain or dyspnea.  Denies headache or change in vision.  Voltaren gel as needed for musculoskeletal pain.  02/26/2018: Patient seen and examined at his bedside.  He reports severe right shoulder pain.  Nonspecific findings on right shoulder x-ray.  States has had prior steroid injection in that joint.  Orthopedic surgery consulted to further assess.  Denies chest pain or dyspnea.  2D echo done on 02/25/2018 with reports of septal lipomatous hypertrophy.  Cardiology consulted to further assess.  February 28, 2018: Patient seen at bedside he is complaining of rash that he noted on the day of admission the rash is erythematous and slightly blistery but denies any itching numbness or pain.  He also stated that he had just moved some material from the brush or bluish area he may have come in contact with poison oak I, poison ivy or  SUma but he said he is never had any reaction to that before. He is also still complaining of pain and numbness to the right arm she had a rotator cuff injury just prior to admission while he was lifting materials  Assessment/Plan: Principal Problem:   Hypertensive urgency, malignant Active Problems:   Hypokalemia   Abnormal EKG   CKD (chronic kidney disease) stage 2, GFR 60-89 ml/min   Bradycardia   Hypertensive emergency   Noncompliance with medications   Poorly-controlled hypertension   CKD (chronic kidney disease) stage 3, GFR 30-59 ml/min (HCC)   Hypertensive emergency suspect secondary to medication noncompliance, improving Blood pressure on admission to 255/148 with renal involvement AKI on CKD 3 Improving Continue current antihypertensive med regimen Continue to monitor vital signs Currently on amlodipine 10 mg daily, hydralazine 50 mg 3 times daily, lisinopril 40 mg daily, and spironolactone 25 mg daily Salt restriction less than 2 g/day His blood pressure has remained controlled ,today's blood pressure 130/84.  Cardiology has just signed off.  He is most likely be discharged in the morning.  AKI on CKD 3, improving Baseline creatinine 1.7 with GFR 48 Creatinine on presentation 2.18 Creatinine 1.9 from 2.18 yesterday Continue to avoid nephrotoxic agents/dehydration/ Renal ultrasound unremarkable Continue half-normal saline at 50 cc/h Repeat BMP in the morning  Right AC joint degenerative changes Self-reported has had previous steroid injection in the joint Voltaren gel as needed for mild to moderate pain Tramadol as needed for moderate to severe pain Orthopedic surgery consulted, MRI right shoulder  pending  Hypokalemia, resolving Potassium 3.4 from 3.0 yesterday Repleted with KCl 40 mEq once Repeat BMP in the morning  Chronic diastolic CHF Last 2D echo done on 04/19/2015 revealed LVEF 55 to 60% with grade 1 diastolic dysfunction Repeat 2D echo done on  02/25/2018 revealed septal lipomatous hypertrophy with normal LVEF Strict I's and O's Daily weight Salt restriction 2 g/day  Septal lipomatous hypertrophy Cardiology consulted to further assess Management per cardiology   Rash/erythematous eruption less likely shingles since he does not have any pain and he does not follow any dermatomal pattern.  Most likely poison oak or IVY.  I will start him on calamine lotion since he does not complain of itching they will be no need for any other medication but I will apply calamine lotion Medication noncompliance Counseled on the importance of medication compliance Continue home antihypertensive medications    Code Status: Full code  Family Communication: None at bedside  Disposition Plan: Home  tomorrow 02/28/2018 once cardiology, orthopedic surgery sign off.  Cardiology signed off today   Consultants:  Cardiology  Orthopedic surgery  Procedures:  None  Antimicrobials:  None  DVT prophylaxis: Subcu heparin 5000 3 times daily   Objective: Vitals:   02/27/18 2327 02/28/18 0329 02/28/18 1133 02/28/18 1554  BP: 124/78 (!) 154/84 130/83 130/84  Pulse: (!) 56 (!) 58    Resp: 19 19  11   Temp: 98.3 F (36.8 C) 98.3 F (36.8 C) 98.2 F (36.8 C) 98 F (36.7 C)  TempSrc: Oral Oral Oral Oral  SpO2: 97% 97%    Weight:      Height:        Intake/Output Summary (Last 24 hours) at 02/28/2018 1931 Last data filed at 02/28/2018 1000 Gross per 24 hour  Intake 540 ml  Output -  Net 540 ml   Filed Weights   02/25/18 0209  Weight: 76.2 kg    Exam:  . General: 62 y.o. year-old male well-developed well-nourished in no acute distress.  Alert and oriented x3. . Cardiovascular: Regular rate and rhythm with no rubs or gallops.  No thyromegaly or JVD noted.  Respiratory: Clear to auscultation with no wheezes or rales. Good inspiratory effort. . Abdomen: Soft nontender nondistended with normal bowel sounds x4  quadrants. . Musculoskeletal: No lower extremity edema. 2/4 pulses in all 4 extremities.  Point tenderness on palpation of the right shoulder joint. Marland Kitchen Psychiatry: Mood is appropriate for condition and setting   Data Reviewed: CBC: Recent Labs  Lab 02/24/18 1501 02/25/18 0326  WBC 5.9 5.6  NEUTROABS 3.7  --   HGB 14.6 15.6  HCT 44.5 47.0  MCV 94.9 92.7  PLT 163 456   Basic Metabolic Panel: Recent Labs  Lab 02/24/18 1501 02/25/18 0326 02/26/18 0356 02/27/18 0239 02/28/18 0250  NA 141 139 136 133* 133*  K 3.2* 3.0* 3.4* 3.7 4.7  CL 104 101 103 100 98  CO2 29 26 26 26 25   GLUCOSE 90 106* 101* 106* 112*  BUN 28* 25* 23 20 28*  CREATININE 2.18* 2.03* 1.90* 1.81* 1.90*  CALCIUM 9.0 9.0 8.8* 9.1 9.3   GFR: Estimated Creatinine Clearance: 36.8 mL/min (A) (by C-G formula based on SCr of 1.9 mg/dL (H)). Liver Function Tests: Recent Labs  Lab 02/25/18 0326  AST 28  ALT 22  ALKPHOS 45  BILITOT 1.0  PROT 6.8  ALBUMIN 3.5   No results for input(s): LIPASE, AMYLASE in the last 168 hours. No results for input(s): AMMONIA in the  last 168 hours. Coagulation Profile: No results for input(s): INR, PROTIME in the last 168 hours. Cardiac Enzymes: Recent Labs  Lab 02/24/18 2231 02/25/18 0326 02/25/18 0930  TROPONINI 0.05* 0.07* 0.05*   BNP (last 3 results) No results for input(s): PROBNP in the last 8760 hours. HbA1C: No results for input(s): HGBA1C in the last 72 hours. CBG: No results for input(s): GLUCAP in the last 168 hours. Lipid Profile: No results for input(s): CHOL, HDL, LDLCALC, TRIG, CHOLHDL, LDLDIRECT in the last 72 hours. Thyroid Function Tests: No results for input(s): TSH, T4TOTAL, FREET4, T3FREE, THYROIDAB in the last 72 hours. Anemia Panel: No results for input(s): VITAMINB12, FOLATE, FERRITIN, TIBC, IRON, RETICCTPCT in the last 72 hours. Urine analysis:    Component Value Date/Time   COLORURINE STRAW (A) 02/24/2018 1750   APPEARANCEUR CLEAR  02/24/2018 1750   LABSPEC 1.009 02/24/2018 1750   PHURINE 8.0 02/24/2018 1750   GLUCOSEU 50 (A) 02/24/2018 1750   HGBUR NEGATIVE 02/24/2018 1750   BILIRUBINUR NEGATIVE 02/24/2018 1750   BILIRUBINUR neg 06/15/2015 0950   KETONESUR NEGATIVE 02/24/2018 1750   PROTEINUR 100 (A) 02/24/2018 1750   UROBILINOGEN 0.2 06/15/2015 0950   UROBILINOGEN 1.0 03/22/2014 1615   NITRITE NEGATIVE 02/24/2018 1750   LEUKOCYTESUR NEGATIVE 02/24/2018 1750   Sepsis Labs: @LABRCNTIP (procalcitonin:4,lacticidven:4)  ) Recent Results (from the past 240 hour(s))  MRSA PCR Screening     Status: None   Collection Time: 02/25/18  3:02 AM  Result Value Ref Range Status   MRSA by PCR NEGATIVE NEGATIVE Final    Comment:        The GeneXpert MRSA Assay (FDA approved for NASAL specimens only), is one component of a comprehensive MRSA colonization surveillance program. It is not intended to diagnose MRSA infection nor to guide or monitor treatment for MRSA infections. Performed at [facility], [place] [address], [place], [place] [address]       Studies: No results found.  Scheduled Meds: . amLODipine  10 mg Oral Daily  . aspirin  81 mg Oral Daily  . B-complex with vitamin C  1 tablet Oral Daily  . calamine   Topical BID  . diclofenac sodium  4 g Topical QID  . heparin  5,000 Units Subcutaneous Q8H  . hydrALAZINE  100 mg Oral TID  . lisinopril  40 mg Oral Daily  . metoprolol tartrate  50 mg Oral BID  . omega-3 acid ethyl esters  1 capsule Oral Daily  . vitamin B-6  50 mg Oral Daily  . spironolactone  50 mg Oral BID  . thiamine  100 mg Oral Daily  . vitamin B-12  100 mcg Oral Daily    Continuous Infusions:    LOS: 4 days     Cristal Deer, MD [facility] Pager [phone]  If 7PM-7AM, please contact night-coverage [email or web address] Password [facility] 02/28/2018, 7:31 PM

## 2018-02-28 NOTE — Plan of Care (Addendum)
Patient continues to progress toward care goals.  SBP is less than 160 most of the time, requiring few PRN doses of Hydralazine.  Anti-hypertensives and Bps were reviewed w/attending Dr. Kyung Bacca this afternoon on rounds. Rash to back/trunk/right arm was assessed by Dr. Kyung Bacca as well and new orders for Aveeno or Calamine are expected.  Patient does not c/o of pain or itching w/this raised rash - questions if it came from [place] he was moving just prior to admission.  Patient continues to require PO Vicodin and Voltaren gel topical as scheduled PRN for right shoulder pain s/p rotator cuff tear.  Will continue to monitor and progress toward care goals as discharge approaches.

## 2018-02-28 NOTE — Progress Notes (Signed)
Patient stable.  Pain about the same in the right shoulder.  Blood pressure reasonably well managed at this time  On exam the patient does have some vesicular type rash which appears to be healing which is anterior in the biceps region as well as lateral and the deltoid region.  Shoulder examination is essentially the same.  MRI scan shows partial-thickness supraspinatus tear as well as some AC joint arthritis.  He is mildly tender in the [facility] joint and does have positive impingement signs  Plan today is subacromial injection.  No evidence of frozen shoulder today.  It is possible that this rash either represents some type of contact dermatitis or may represent shingles.  Subacromial injection performed today.  I will have occupational therapy start working with his shoulder for range of motion exercises.  Follow-up with me in 3 weeks for repeat clinical assessment   Procedure note  Patient has bursitis in the right shoulder by MRI scanning presents now for subacromial injection  Right shoulder posterior area is pre-scrubbed and prepped with alcohol and Betadine Timeout called  Area is numbed up first with 10 cc of plain lidocaine.  Subacromial injection then performed with 9 cc of Marcaine 1 cc of Depo-Medrol.  Patient tolerated the procedure well without immediate complications.

## 2018-03-01 MED ORDER — CALAMINE EX LOTN: TOPICAL_LOTION | Freq: Two times a day (BID) | CUTANEOUS | 0 refills | Status: DC

## 2018-03-01 MED ORDER — SPIRONOLACTONE 50 MG PO TABS: 50.0000 mg | ORAL_TABLET | Freq: Two times a day (BID) | ORAL | 0 refills | Status: DC

## 2018-03-01 MED ORDER — HYDRALAZINE HCL 100 MG PO TABS: 100.0000 mg | ORAL_TABLET | Freq: Three times a day (TID) | ORAL | 0 refills | Status: DC

## 2018-03-01 MED ORDER — METOPROLOL TARTRATE 50 MG PO TABS: 50.0000 mg | ORAL_TABLET | Freq: Two times a day (BID) | ORAL | 0 refills | Status: DC

## 2018-03-01 MED ORDER — DICLOFENAC SODIUM 1 % TD GEL: 4.0000 g | Freq: Four times a day (QID) | TRANSDERMAL | 0 refills | Status: DC

## 2018-03-01 MED ORDER — AMLODIPINE BESYLATE 10 MG PO TABS: 10.0000 mg | ORAL_TABLET | Freq: Every day | ORAL | 0 refills | Status: DC

## 2018-03-01 MED ORDER — HYDROCODONE-ACETAMINOPHEN 5-325 MG PO TABS: 1.0000 | ORAL_TABLET | Freq: Four times a day (QID) | ORAL | 0 refills | Status: AC | PRN

## 2018-03-01 MED ORDER — LISINOPRIL 40 MG PO TABS: 40.0000 mg | ORAL_TABLET | Freq: Every day | ORAL | 0 refills | Status: DC

## 2018-03-01 NOTE — Plan of Care (Signed)

## 2018-03-01 NOTE — Discharge Instructions (Signed)

## 2018-03-01 NOTE — Discharge Summary (Signed)
Physician Discharge Summary  Corey Guerrero ALP:[id number] DOB: 1955/08/08 DOA: 02/24/2018  PCP: Corey Pounds, NP  Admit date: 02/24/2018 Discharge date: 03/01/2018  Admitted From: home Disposition:  home Recommendations for Outpatient Follow-up:  1. Follow up with PCP in 1-2 weeks 2. Please obtain BMP/CBC in one week follow-up on renal functions as well as potassium level. 3 Follow-up with Dr. Alycia Guerrero 4  Follow-up with Dr. [name] cardiology  Home Health: None Equipment/Devices none  Discharge Condition stable CODE STATUS full code Diet recommendation: Low-salt cardiac diet  Brief/Interim Summary::62 y.o.malewith medical history significant ofhypertension, hyperlipidemia who has been noncompliant with his home medications. Patient has had brittle hypertension on 4 different medications including hydralazine, lisinopril, Norvasc and Aldactone. He has not been taking the medications for weeks. He checked his blood pressure when he felt weak generalized aches and headache. Systolic blood pressure was 230 at the time. He went to work and was still feeling bad so he decided to come to the emergency room.   In the ER patient was found to have initial blood pressure of 255/148. Admitted for hypertensive emergency with acute kidney injury on CKD 3.  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  Discharge Diagnoses:  Principal Problem:   Hypertensive urgency, malignant Active Problems:   Hypokalemia   Abnormal EKG   CKD (chronic kidney disease) stage 2, GFR 60-89 ml/min   Bradycardia   Hypertensive emergency   Noncompliance with medications   Poorly-controlled  hypertension   CKD (chronic kidney disease) stage 3, GFR 30-59 ml/min (HCC) Hypertensive emergency/malignant hypertensionsecondary to medication noncompliance Blood pressure on admission to 255/148 with renal involvement AKI on CKD 3 Increased medications doses Cardiology added metoprolol Currently on amlodipine 10 mg daily, hydralazine 100 mg 3 times daily, lisinopril 40 mg daily and Spironolactone 50 mg twice daily and metoprolol tartrate 50 mg twice daily Renal artery duplex ultrasound not done consider doing this is an outpatient if blood pressure remains elevated. Salt restriction less than 2 g/day  AKI on CKD 3, improving Baseline creatinine 1.7 with GFR 48 Creatinine on presentation 2.18 Creatinine1.9 on the day of discharge.  Creatinine has remained stable with the last 3 days. Continue to avoid nephrotoxic agents/dehydration/ Renal ultrasound unremarkable  Right supraspinatus tear/right AC jointOA Self-reported has had previous steroid injection in the joint Received intra-articular steroid injection by orthopedic surgery on 02/27/2018 Voltaren gel as needed for mild to moderate pain Tylenol 79for3 times daily x5 days as recommended by orthopedic surgery  Hypokalemia,resolved Post repletion with p.o. KCl and added IV magnesium  Chronic diastolic CHF Last 2D echo done on 04/19/2015 revealed LVEF 55 to 60% with grade 1 diastolic dysfunction Repeat 2D echo done on 02/25/2018 revealed septal lipomatous hypertrophy with normal LVEF Salt restriction 2 g/day   Medication noncompliance Counseled on the importance of medication compliance Continue home antihypertensive medications  Rash erythematous eruption continue using calamine lotion Discharge Instructions  Discharge Instructions    Call MD for:  difficulty breathing, headache or visual disturbances   Complete by:  As directed    Call MD for:  persistant dizziness or light-headedness   Complete by:  As  directed    Call MD for:  persistant nausea and vomiting   Complete by:  As directed  Call MD for:  redness, tenderness, or signs of infection (pain, swelling, redness, odor or green/yellow discharge around incision site)   Complete by:  As directed    Call MD for:  severe uncontrolled pain   Complete by:  As directed    Diet - low sodium heart healthy   Complete by:  As directed    Increase activity slowly   Complete by:  As directed      Allergies as of 03/01/2018      Reactions   Bee Venom Anaphylaxis   Desyrel [trazodone Hcl] Anaphylaxis, Shortness Of Breath   Patient STOPPED BREATHING!!      Medication List    STOP taking these medications   doxycycline 100 MG capsule Commonly known as:  VIBRAMYCIN   hydrOXYzine 25 MG tablet Commonly known as:  ATARAX/VISTARIL     TAKE these medications   acetaminophen 650 MG CR tablet Commonly known as:  TYLENOL Take 1 tablet (650 mg total) by mouth every 8 (eight) hours as needed for pain.   amLODipine 10 MG tablet Commonly known as:  NORVASC Take 1 tablet (10 mg total) by mouth daily.   aspirin 81 MG chewable tablet Chew 1 tablet (81 mg total) by mouth daily.   b complex vitamins tablet Take 1 tablet by mouth daily.   calamine lotion Apply topically 2 (two) times daily.   diclofenac sodium 1 % Gel Commonly known as:  VOLTAREN Apply 4 g topically 4 (four) times daily.   diphenhydrAMINE 25 MG tablet Commonly known as:  BENADRYL Take 25 mg by mouth daily as needed (for sinus headaches).   hydrALAZINE 100 MG tablet Commonly known as:  APRESOLINE Take 1 tablet (100 mg total) by mouth 3 (three) times daily. What changed:    medication strength  how much to take   HYDROcodone-acetaminophen 5-325 MG tablet Commonly known as:  NORCO/VICODIN Take 1-2 tablets by mouth every 6 (six) hours as needed for up to 7 days for moderate pain or severe pain.   lisinopril 40 MG tablet Commonly known as:  PRINIVIL,ZESTRIL Take  1 tablet (40 mg total) by mouth daily.   metoprolol tartrate 50 MG tablet Commonly known as:  LOPRESSOR Take 1 tablet (50 mg total) by mouth 2 (two) times daily.   OMEGA-3 PO Take 1 capsule by mouth daily.   sildenafil 100 MG tablet Commonly known as:  VIAGRA Take 1 tablet (100 mg total) by mouth daily as needed for erectile dysfunction.   spironolactone 50 MG tablet Commonly known as:  ALDACTONE Take 1 tablet (50 mg total) by mouth 2 (two) times daily. What changed:    medication strength  how much to take  when to take this   VITAMIN B 12 PO Take 1 tablet by mouth daily. Reported on 06/15/2015   VITAMIN B-1 PO Take 1 tablet by mouth daily.   VITAMIN B-6 PO Take 1 tablet by mouth daily.      Follow-up Information    [facility]. Go on 03/20/2018.   Why:  at 9:10 for post discharge follow up appt Contact information: [place] [address] [phone]       Meredith Pel, MD Follow up in 3 week(s).   Specialty:  Orthopedic Surgery Contact information: [place] [address] [phone]        [name], MD Follow up.   Specialty:  Cardiology Contact information: [place]  [address] [place] [address] [phone]          Allergies  Allergen Reactions  . Bee Venom Anaphylaxis  . Desyrel [Trazodone Hcl] Anaphylaxis and Shortness Of Breath    Patient STOPPED BREATHING!!    Consultations: Cardiology Dr. [name], Ortho Dr. [name]. Procedures/Studies: Dg Shoulder Right  Result Date: 02/24/2018 CLINICAL DATA:  Right shoulder pain EXAM: RIGHT SHOULDER - 2+ VIEW COMPARISON:  None. FINDINGS: Deformity in the distal right clavicle likely related to old healed fracture. Degenerative changes in the right AC joint. Glenohumeral joint is intact. No acute fracture, subluxation or dislocation. IMPRESSION: No acute bony abnormality. Probable old healed  fracture of the distal right clavicle. Mild degenerative changes in the right AC joint. Electronically Signed   By: Rolm Baptise M.D.   On: 02/24/2018 19:18   US Renal  Result Date: 02/25/2018 CLINICAL DATA:  62 year old male with a history acute kidney injury EXAM: RENAL / URINARY TRACT ULTRASOUND COMPLETE COMPARISON:  CT 10/28/2015 FINDINGS: Right Kidney: Length: 9.7 cm. Echogenicity within normal limits. Flow confirmed in the hilum of the left kidney. No hydronephrosis. Left Kidney: Length: 9.7 cm. Echogenicity within normal limits. No hydronephrosis. Flow confirmed in the hilum of the left kidney. Anechoic cystic structure with through transmission and no internal complexity measures 1 cm x 1 cm x 8 mm. Bladder: Appears normal for degree of bladder distention. Prostate appears enlarged with greatest transverse diameter 6.7 cm IMPRESSION: Sonographic survey negative for hydronephrosis. Bosniak 1 cyst of the left kidney. Prostatomegaly Electronically Signed   By: Corrie Mckusick D.O.   On: 02/25/2018 13:46   Mr Shoulder Right Wo Contrast  Result Date: 02/26/2018 CLINICAL DATA:  Chronic right shoulder pain. History of prior steroid injections. Rule out internal derangement. EXAM: MRI OF THE RIGHT SHOULDER WITHOUT CONTRAST TECHNIQUE: Multiplanar, multisequence MR imaging of the shoulder was performed. No intravenous contrast was administered. COMPARISON:  None. FINDINGS: Rotator cuff: Partial articular surface tear involving the anterior fibers of the supraspinatus tendon, near the insertion measuring 4 x 9 mm coronal oblique by AP. Articular surface fraying of the myotendinous junction of the subscapularis with subscapularis tendinosis. Intact teres minor and infraspinatus. Muscles:  No muscle atrophy. Biceps long head: Tendinosis of the horizontal portion. Intact biceps anchor and tendon. Acromioclavicular Joint: Moderate arthropathy of the acromioclavicular joint with trace joint fluid and reactive edema  noted remote. Type II curved acromion. Small amount of subacromial and subdeltoid bursal fluid. Glenohumeral Joint: No focal chondral defects.  No joint effusion. Labrum:  Intact Bones: Degenerative subcortical cystic change of the superolateral humeral head involving the greater tuberosity. No fracture or suspicious osseous lesions. Other: None IMPRESSION: 1. Partial articular surface tear involving the anterior fibers of the supraspinatus tendon, near the insertion. 2. Tendinosis of the subscapularis with partial articular surface fraying along the myotendinous juncture. 3. Subacromial and subdeltoid bursitis. 4. Biceps tendinosis. 5. AC joint osteoarthritis. Subcortical degenerative cystic change of the superolateral humeral head. Electronically Signed   By: Ashley Royalty M.D.   On: 02/26/2018 17:09    (Echo, Carotid, EGD, Colonoscopy, ERCP)    Subjective:   Discharge Exam: Vitals:   03/01/18 0610 03/01/18 0746  BP:  (!) 148/95  Pulse:    Resp: 18 15  Temp:  97.8 F (36.6 C)  SpO2:     Vitals:   03/01/18 0300 03/01/18 0408 03/01/18 0610 03/01/18 0746  BP:  (!) 161/95  (!) 148/95  Pulse:  (!) 54    Resp: 12  15 18 15   Temp:  98 F (36.7 C)  97.8 F (36.6 C)  TempSrc:  Oral  Oral  SpO2:  96%    Weight:      Height:        General: Pt is alert, awake, not in acute distress Cardiovascular: RRR, S1/S2 +, no rubs, no gallops Respiratory: CTA bilaterally, no wheezing, no rhonchi Abdominal: Soft, NT, ND, bowel sounds + Extremities: no edema, no cyanosis    The results of significant diagnostics from this hospitalization (including imaging, microbiology, ancillary and laboratory) are listed below for reference.     Microbiology: Recent Results (from the past 240 hour(s))  MRSA PCR Screening     Status: None   Collection Time: 02/25/18  3:02 AM  Result Value Ref Range Status   MRSA by PCR NEGATIVE NEGATIVE Final    Comment:        The GeneXpert MRSA Assay (FDA approved for  NASAL specimens only), is one component of a comprehensive MRSA colonization surveillance program. It is not intended to diagnose MRSA infection nor to guide or monitor treatment for MRSA infections. Performed at [facility], [place] [address], [place], [place] [address]      Labs: BNP (last 3 results) No results for input(s): BNP in the last 8760 hours. Basic Metabolic Panel: Recent Labs  Lab 02/24/18 1501 02/25/18 0326 02/26/18 0356 02/27/18 0239 02/28/18 0250  NA 141 139 136 133* 133*  K 3.2* 3.0* 3.4* 3.7 4.7  CL 104 101 103 100 98  CO2 29 26 26 26 25   GLUCOSE 90 106* 101* 106* 112*  BUN 28* 25* 23 20 28*  CREATININE 2.18* 2.03* 1.90* 1.81* 1.90*  CALCIUM 9.0 9.0 8.8* 9.1 9.3   Liver Function Tests: Recent Labs  Lab 02/25/18 0326  AST 28  ALT 22  ALKPHOS 45  BILITOT 1.0  PROT 6.8  ALBUMIN 3.5   No results for input(s): LIPASE, AMYLASE in the last 168 hours. No results for input(s): AMMONIA in the last 168 hours. CBC: Recent Labs  Lab 02/24/18 1501 02/25/18 0326  WBC 5.9 5.6  NEUTROABS 3.7  --   HGB 14.6 15.6  HCT 44.5 47.0  MCV 94.9 92.7  PLT 163 165   Cardiac Enzymes: Recent Labs  Lab 02/24/18 2231 02/25/18 0326 02/25/18 0930  TROPONINI 0.05* 0.07* 0.05*   BNP: Invalid input(s): POCBNP CBG: No results for input(s): GLUCAP in the last 168 hours. D-Dimer No results for input(s): DDIMER in the last 72 hours. Hgb A1c No results for input(s): HGBA1C in the last 72 hours. Lipid Profile No results for input(s): CHOL, HDL, LDLCALC, TRIG, CHOLHDL, LDLDIRECT in the last 72 hours. Thyroid function studies No results for input(s): TSH, T4TOTAL, T3FREE, THYROIDAB in the last 72 hours.  Invalid input(s): FREET3 Anemia work up No results for input(s): VITAMINB12, FOLATE, FERRITIN, TIBC, IRON, RETICCTPCT in the last 72 hours. Urinalysis    Component Value Date/Time   COLORURINE STRAW (A) 02/24/2018 1750   APPEARANCEUR CLEAR 02/24/2018 1750    LABSPEC 1.009 02/24/2018 1750   PHURINE 8.0 02/24/2018 1750   GLUCOSEU 50 (A) 02/24/2018 1750   HGBUR NEGATIVE 02/24/2018 1750   BILIRUBINUR NEGATIVE 02/24/2018 1750   BILIRUBINUR neg 06/15/2015 0950   KETONESUR NEGATIVE 02/24/2018 1750   PROTEINUR 100 (A) 02/24/2018 1750   UROBILINOGEN 0.2 06/15/2015 0950   UROBILINOGEN 1.0 03/22/2014 1615   NITRITE NEGATIVE 02/24/2018 1750   LEUKOCYTESUR NEGATIVE 02/24/2018 1750   Sepsis Labs Invalid  input(s): PROCALCITONIN,  WBC,  LACTICIDVEN Microbiology Recent Results (from the past 240 hour(s))  MRSA PCR Screening     Status: None   Collection Time: 02/25/18  3:02 AM  Result Value Ref Range Status   MRSA by PCR NEGATIVE NEGATIVE Final    Comment:        The GeneXpert MRSA Assay (FDA approved for NASAL specimens only), is one component of a comprehensive MRSA colonization surveillance program. It is not intended to diagnose MRSA infection nor to guide or monitor treatment for MRSA infections. Performed at [facility], [place] [address], [place], [place] [address]      Time coordinating discharge:35  minutes  SIGNED:   Georgette Shell, MD  [facility] 03/01/2018, 10:54 AM Pager   If 7PM-7AM, please contact night-coverage [email or web address] Password [email or web address]

## 2018-03-01 NOTE — Progress Notes (Signed)
Patient provided with [facility] letter, verbalized understanding for how to use, no other CM needs.

## 2018-03-01 NOTE — Progress Notes (Deleted)
Physician Discharge Summary  Corey Guerrero JOI:[id number] DOB: 04-09-1956 DOA: 02/24/2018  PCP: [name], NP  Admit date: 02/24/2018 Discharge date: 03/01/2018  Admitted From: Home Disposition: Home Recommendations for Outpatient Follow-up:  1. Follow up with PCP in 1-2 weeks 2. Please obtain BMP/CBC in one week to follow-up on renal functions as well as potassium levels 3. Follow-up with Dr. [name] Ortho 4. Follow-up with Dr. [name] cardiology  [place] none Equipment/Devices: None Discharge Condition stable CODE STATUS: Full code Diet recommendation: Low-salt cardiac diet  Brief/Interim Summary:62 y.o.malewith medical history significant ofhypertension, hyperlipidemia who has been noncompliant with his home medications. Patient has had brittle hypertension on 4 different medications including hydralazine, lisinopril, Norvasc and Aldactone. He has not been taking the medications for weeks. He checked his blood pressure when he felt weak generalized aches and headache. Systolic blood pressure was 230 at the time. He went to work and was still feeling bad so he decided to come to the emergency room.   In the ER patient was found to have initial blood pressure of 255/148.  Admitted for hypertensive emergency with acute kidney injury on CKD 3.  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 Discharge Diagnoses:  Principal Problem:   Hypertensive urgency, malignant Active Problems:   Hypokalemia   Abnormal EKG   CKD (chronic kidney disease) stage 2, GFR 60-89 ml/min   Bradycardia   Hypertensive emergency   Noncompliance with medications   Poorly-controlled  hypertension   CKD (chronic kidney disease) stage 3, GFR 30-59 ml/min (HCC)  Hypertensive emergency/malignant hypertension secondary to medication noncompliance Blood pressure on admission to 255/148 with renal involvement AKI on CKD 3 Increased medications doses Cardiology added metoprolol Currently on amlodipine 10 mg daily, hydralazine 100 mg 3 times daily, lisinopril 40 mg daily and Spironolactone 50 mg twice daily and metoprolol tartrate 50 mg twice daily Renal artery duplex ultrasound  not done consider doing this is an outpatient if blood pressure remains elevated. Salt restriction less than 2 g/day  AKI on CKD 3, improving Baseline creatinine 1.7 with GFR 48 Creatinine on presentation 2.18 Creatinine 1.9 on the day of discharge.  Creatinine has remained stable with the last 3 days. Continue to avoid nephrotoxic agents/dehydration/ Renal ultrasound unremarkable  Right supraspinatus tear/right AC joint OA Self-reported has had previous steroid injection in the joint Received intra-articular steroid injection by orthopedic surgery on 02/27/2018 Voltaren gel as needed for mild to moderate pain Tylenol 3 for 3 times daily x5 days as recommended by orthopedic surgery  Hypokalemia, resolved Post repletion with p.o. KCl and added IV magnesium  Chronic diastolic CHF Last 2D echo done on 04/19/2015 revealed LVEF 55 to 60% with grade 1 diastolic dysfunction Repeat 2D echo done on 02/25/2018 revealed septal lipomatous hypertrophy with normal LVEF Salt restriction 2 g/day   Medication noncompliance Counseled on the importance of medication compliance Continue home antihypertensive medications  Discharge Instructions  Discharge Instructions    Call MD for:  difficulty breathing, headache or visual disturbances   Complete by:  As directed    Call MD for:  persistant dizziness or light-headedness   Complete by:  As directed    Call MD for:  persistant nausea and vomiting    Complete by:  As directed  Call MD for:  redness, tenderness, or signs of infection (pain, swelling, redness, odor or green/yellow discharge around incision site)   Complete by:  As directed    Call MD for:  severe uncontrolled pain   Complete by:  As directed    Diet - low sodium heart healthy   Complete by:  As directed    Increase activity slowly   Complete by:  As directed      Allergies as of 03/01/2018      Reactions   Bee Venom Anaphylaxis   Desyrel [trazodone Hcl] Anaphylaxis, Shortness Of Breath   Patient STOPPED BREATHING!!      Medication List    STOP taking these medications   doxycycline 100 MG capsule Commonly known as:  VIBRAMYCIN   hydrOXYzine 25 MG tablet Commonly known as:  ATARAX/VISTARIL     TAKE these medications   acetaminophen 650 MG CR tablet Commonly known as:  TYLENOL Take 1 tablet (650 mg total) by mouth every 8 (eight) hours as needed for pain.   amLODipine 10 MG tablet Commonly known as:  NORVASC Take 1 tablet (10 mg total) by mouth daily.   aspirin 81 MG chewable tablet Chew 1 tablet (81 mg total) by mouth daily.   b complex vitamins tablet Take 1 tablet by mouth daily.   calamine lotion Apply topically 2 (two) times daily.   diclofenac sodium 1 % Gel Commonly known as:  VOLTAREN Apply 4 g topically 4 (four) times daily.   diphenhydrAMINE 25 MG tablet Commonly known as:  BENADRYL Take 25 mg by mouth daily as needed (for sinus headaches).   hydrALAZINE 100 MG tablet Commonly known as:  APRESOLINE Take 1 tablet (100 mg total) by mouth 3 (three) times daily. What changed:    medication strength  how much to take   HYDROcodone-acetaminophen 5-325 MG tablet Commonly known as:  NORCO/VICODIN Take 1-2 tablets by mouth every 6 (six) hours as needed for up to 7 days for moderate pain or severe pain.   lisinopril 40 MG tablet Commonly known as:  PRINIVIL,ZESTRIL Take 1 tablet (40 mg total) by mouth daily.   metoprolol  tartrate 50 MG tablet Commonly known as:  LOPRESSOR Take 1 tablet (50 mg total) by mouth 2 (two) times daily.   OMEGA-3 PO Take 1 capsule by mouth daily.   sildenafil 100 MG tablet Commonly known as:  VIAGRA Take 1 tablet (100 mg total) by mouth daily as needed for erectile dysfunction.   spironolactone 50 MG tablet Commonly known as:  ALDACTONE Take 1 tablet (50 mg total) by mouth 2 (two) times daily. What changed:    medication strength  how much to take  when to take this   VITAMIN B 12 PO Take 1 tablet by mouth daily. Reported on 06/15/2015   VITAMIN B-1 PO Take 1 tablet by mouth daily.   VITAMIN B-6 PO Take 1 tablet by mouth daily.      Follow-up Information    [facility] Go on 03/20/2018.   Why:  at 9:10 for post discharge follow up appt Contact information: [place] [address] [phone]       Meredith Pel, MD Follow up in 3 week(s).   Specialty:  Orthopedic Surgery Contact information: [place] [address] [phone]        [name], MD Follow up.   Specialty:  Cardiology Contact information: [place]  [address] [place] [address] [phone]          Allergies  Allergen Reactions  . Bee Venom Anaphylaxis  . Desyrel [Trazodone Hcl] Anaphylaxis and Shortness Of Breath    Patient STOPPED BREATHING!!    Consultations:  Cardiology, Ortho Dr. [name]   Procedures/Studies: Dg Shoulder Right  Result Date: 02/24/2018 CLINICAL DATA:  Right shoulder pain EXAM: RIGHT SHOULDER - 2+ VIEW COMPARISON:  None. FINDINGS: Deformity in the distal right clavicle likely related to old healed fracture. Degenerative changes in the right AC joint. Glenohumeral joint is intact. No acute fracture, subluxation or dislocation. IMPRESSION: No acute bony abnormality. Probable old healed fracture of the distal right clavicle. Mild degenerative  changes in the right AC joint. Electronically Signed   By: Rolm Baptise M.D.   On: 02/24/2018 19:18   US Renal  Result Date: 02/25/2018 CLINICAL DATA:  62 year old male with a history acute kidney injury EXAM: RENAL / URINARY TRACT ULTRASOUND COMPLETE COMPARISON:  CT 10/28/2015 FINDINGS: Right Kidney: Length: 9.7 cm. Echogenicity within normal limits. Flow confirmed in the hilum of the left kidney. No hydronephrosis. Left Kidney: Length: 9.7 cm. Echogenicity within normal limits. No hydronephrosis. Flow confirmed in the hilum of the left kidney. Anechoic cystic structure with through transmission and no internal complexity measures 1 cm x 1 cm x 8 mm. Bladder: Appears normal for degree of bladder distention. Prostate appears enlarged with greatest transverse diameter 6.7 cm IMPRESSION: Sonographic survey negative for hydronephrosis. Bosniak 1 cyst of the left kidney. Prostatomegaly Electronically Signed   By: Corrie Mckusick D.O.   On: 02/25/2018 13:46   Mr Shoulder Right Wo Contrast  Result Date: 02/26/2018 CLINICAL DATA:  Chronic right shoulder pain. History of prior steroid injections. Rule out internal derangement. EXAM: MRI OF THE RIGHT SHOULDER WITHOUT CONTRAST TECHNIQUE: Multiplanar, multisequence MR imaging of the shoulder was performed. No intravenous contrast was administered. COMPARISON:  None. FINDINGS: Rotator cuff: Partial articular surface tear involving the anterior fibers of the supraspinatus tendon, near the insertion measuring 4 x 9 mm coronal oblique by AP. Articular surface fraying of the myotendinous junction of the subscapularis with subscapularis tendinosis. Intact teres minor and infraspinatus. Muscles:  No muscle atrophy. Biceps long head: Tendinosis of the horizontal portion. Intact biceps anchor and tendon. Acromioclavicular Joint: Moderate arthropathy of the acromioclavicular joint with trace joint fluid and reactive edema noted remote. Type II curved acromion. Small amount of  subacromial and subdeltoid bursal fluid. Glenohumeral Joint: No focal chondral defects.  No joint effusion. Labrum:  Intact Bones: Degenerative subcortical cystic change of the superolateral humeral head involving the greater tuberosity. No fracture or suspicious osseous lesions. Other: None IMPRESSION: 1. Partial articular surface tear involving the anterior fibers of the supraspinatus tendon, near the insertion. 2. Tendinosis of the subscapularis with partial articular surface fraying along the myotendinous juncture. 3. Subacromial and subdeltoid bursitis. 4. Biceps tendinosis. 5. AC joint osteoarthritis. Subcortical degenerative cystic change of the superolateral humeral head. Electronically Signed   By: Ashley Royalty M.D.   On: 02/26/2018 17:09    (Echo, Carotid, EGD, Colonoscopy, ERCP)    Subjective: Resting in bed in no acute distress complains of right shoulder pain no chest pain shortness of breath nausea vomiting.  Anxious to go home.   Discharge Exam: Vitals:   03/01/18 0610 03/01/18 0746  BP:  (!) 148/95  Pulse:    Resp: 18 15  Temp:  97.8 F (36.6 C)  SpO2:     Vitals:  03/01/18 0300 03/01/18 0408 03/01/18 0610 03/01/18 0746  BP:  (!) 161/95  (!) 148/95  Pulse:  (!) 54    Resp: 12 15 18 15   Temp:  98 F (36.7 C)  97.8 F (36.6 C)  TempSrc:  Oral  Oral  SpO2:  96%    Weight:      Height:        General: Pt is alert, awake, not in acute distress Cardiovascular: RRR, S1/S2 +, no rubs, no gallops Respiratory: CTA bilaterally, no wheezing, no rhonchi Abdominal: Soft, NT, ND, bowel sounds + Extremities: no edema, no cyanosis    The results of significant diagnostics from this hospitalization (including imaging, microbiology, ancillary and laboratory) are listed below for reference.     Microbiology: Recent Results (from the past 240 hour(s))  MRSA PCR Screening     Status: None   Collection Time: 02/25/18  3:02 AM  Result Value Ref Range Status   MRSA by PCR  NEGATIVE NEGATIVE Final    Comment:        The GeneXpert MRSA Assay (FDA approved for NASAL specimens only), is one component of a comprehensive MRSA colonization surveillance program. It is not intended to diagnose MRSA infection nor to guide or monitor treatment for MRSA infections. Performed at [facility], [place] [address] [place], [place] [address]      Labs: BNP (last 3 results) No results for input(s): BNP in the last 8760 hours. Basic Metabolic Panel: Recent Labs  Lab 02/24/18 1501 02/25/18 0326 02/26/18 0356 02/27/18 0239 02/28/18 0250  NA 141 139 136 133* 133*  K 3.2* 3.0* 3.4* 3.7 4.7  CL 104 101 103 100 98  CO2 29 26 26 26 25   GLUCOSE 90 106* 101* 106* 112*  BUN 28* 25* 23 20 28*  CREATININE 2.18* 2.03* 1.90* 1.81* 1.90*  CALCIUM 9.0 9.0 8.8* 9.1 9.3   Liver Function Tests: Recent Labs  Lab 02/25/18 0326  AST 28  ALT 22  ALKPHOS 45  BILITOT 1.0  PROT 6.8  ALBUMIN 3.5   No results for input(s): LIPASE, AMYLASE in the last 168 hours. No results for input(s): AMMONIA in the last 168 hours. CBC: Recent Labs  Lab 02/24/18 1501 02/25/18 0326  WBC 5.9 5.6  NEUTROABS 3.7  --   HGB 14.6 15.6  HCT 44.5 47.0  MCV 94.9 92.7  PLT 163 165   Cardiac Enzymes: Recent Labs  Lab 02/24/18 2231 02/25/18 0326 02/25/18 0930  TROPONINI 0.05* 0.07* 0.05*   BNP: Invalid input(s): POCBNP CBG: No results for input(s): GLUCAP in the last 168 hours. D-Dimer No results for input(s): DDIMER in the last 72 hours. Hgb A1c No results for input(s): HGBA1C in the last 72 hours. Lipid Profile No results for input(s): CHOL, HDL, LDLCALC, TRIG, CHOLHDL, LDLDIRECT in the last 72 hours. Thyroid function studies No results for input(s): TSH, T4TOTAL, T3FREE, THYROIDAB in the last 72 hours.  Invalid input(s): FREET3 Anemia work up No results for input(s): VITAMINB12, FOLATE, FERRITIN, TIBC, IRON, RETICCTPCT in the last 72 hours. Urinalysis    Component  Value Date/Time   COLORURINE STRAW (A) 02/24/2018 1750   APPEARANCEUR CLEAR 02/24/2018 1750   LABSPEC 1.009 02/24/2018 1750   PHURINE 8.0 02/24/2018 1750   GLUCOSEU 50 (A) 02/24/2018 1750   HGBUR NEGATIVE 02/24/2018 1750   BILIRUBINUR NEGATIVE 02/24/2018 1750   BILIRUBINUR neg 06/15/2015 0950   KETONESUR NEGATIVE 02/24/2018 1750   PROTEINUR 100 (A) 02/24/2018 1750   UROBILINOGEN  0.2 06/15/2015 0950   UROBILINOGEN 1.0 03/22/2014 1615   NITRITE NEGATIVE 02/24/2018 1750   LEUKOCYTESUR NEGATIVE 02/24/2018 1750   Sepsis Labs Invalid input(s): PROCALCITONIN,  WBC,  LACTICIDVEN Microbiology Recent Results (from the past 240 hour(s))  MRSA PCR Screening     Status: None   Collection Time: 02/25/18  3:02 AM  Result Value Ref Range Status   MRSA by PCR NEGATIVE NEGATIVE Final    Comment:        The GeneXpert MRSA Assay (FDA approved for NASAL specimens only), is one component of a comprehensive MRSA colonization surveillance program. It is not intended to diagnose MRSA infection nor to guide or monitor treatment for MRSA infections. Performed at [facility], [place] [address], [place], [place] [address]      Time coordinating discharge:  34 minutes  SIGNED:   Georgette Shell, MD  [facility] 03/01/2018, 10:46 AM Pager   If 7PM-7AM, please contact night-coverage [email or web address] Password [email or web address]

## 2018-03-01 NOTE — Progress Notes (Signed)
Verbal and written discharge instructions provided to patient. All questions answered. Written prescriptions given to patient. PIV removed per discharge protocol. All personal belongings returned to patient. Patient taken by RN via wheelchair to waiting car for discharge.

## 2018-03-05 ENCOUNTER — Other Ambulatory Visit: Payer: Self-pay

## 2018-03-05 ENCOUNTER — Encounter (HOSPITAL_COMMUNITY): Payer: Self-pay

## 2018-03-05 ENCOUNTER — Emergency Department (HOSPITAL_COMMUNITY)
Admission: EM | Admit: 2018-03-05 | Discharge: 2018-03-05 | Disposition: A | Discharge status: Home / Self Care | Payer: Medicaid Other | Attending: Emergency Medicine | Admitting: Emergency Medicine

## 2018-03-05 ENCOUNTER — Emergency Department (HOSPITAL_COMMUNITY): Payer: Medicaid Other

## 2018-03-05 DIAGNOSIS — R42 Dizziness and giddiness: Secondary | ICD-10-CM | POA: Diagnosis not present

## 2018-03-05 DIAGNOSIS — R0602 Shortness of breath: Secondary | ICD-10-CM | POA: Diagnosis present

## 2018-03-05 DIAGNOSIS — I10 Essential (primary) hypertension: Secondary | ICD-10-CM

## 2018-03-05 DIAGNOSIS — N183 Chronic kidney disease, stage 3 (moderate): Secondary | ICD-10-CM | POA: Insufficient documentation

## 2018-03-05 DIAGNOSIS — Z79899 Other long term (current) drug therapy: Secondary | ICD-10-CM | POA: Diagnosis not present

## 2018-03-05 DIAGNOSIS — I129 Hypertensive chronic kidney disease with stage 1 through stage 4 chronic kidney disease, or unspecified chronic kidney disease: Secondary | ICD-10-CM | POA: Diagnosis not present

## 2018-03-05 LAB — URINALYSIS, ROUTINE W REFLEX MICROSCOPIC
Bilirubin Urine: NEGATIVE
Glucose, UA: NEGATIVE mg/dL
Hgb urine dipstick: NEGATIVE
Ketones, ur: NEGATIVE mg/dL
Leukocytes, UA: NEGATIVE
Nitrite: NEGATIVE
Protein, ur: NEGATIVE mg/dL
Specific Gravity, Urine: 1.008 (ref 1.005–1.030)
pH: 6 (ref 5.0–8.0)

## 2018-03-05 LAB — CBC WITH DIFFERENTIAL/PLATELET
Basophils Absolute: 0 10*3/uL (ref 0.0–0.1)
Basophils Relative: 0 %
Eosinophils Absolute: 0 10*3/uL (ref 0.0–0.7)
Eosinophils Relative: 0 %
HCT: 47.5 % (ref 39.0–52.0)
Hemoglobin: 16.4 g/dL (ref 13.0–17.0)
Lymphocytes Relative: 29 %
Lymphs Abs: 1.9 10*3/uL (ref 0.7–4.0)
MCH: 32 pg (ref 26.0–34.0)
MCHC: 34.5 g/dL (ref 30.0–36.0)
MCV: 92.8 fL (ref 78.0–100.0)
Monocytes Absolute: 0.3 10*3/uL (ref 0.1–1.0)
Monocytes Relative: 5 %
Neutro Abs: 4.4 10*3/uL (ref 1.7–7.7)
Neutrophils Relative %: 66 %
Platelets: 344 10*3/uL (ref 150–400)
RBC: 5.12 MIL/uL (ref 4.22–5.81)
RDW: 12.6 % (ref 11.5–15.5)
WBC: 6.6 10*3/uL (ref 4.0–10.5)

## 2018-03-05 LAB — BASIC METABOLIC PANEL
Anion gap: 11 (ref 5–15)
BUN: 46 mg/dL — ABNORMAL HIGH (ref 8–23)
CO2: 26 mmol/L (ref 22–32)
Calcium: 9.8 mg/dL (ref 8.9–10.3)
Chloride: 97 mmol/L — ABNORMAL LOW (ref 98–111)
Creatinine, Ser: 2.07 mg/dL — ABNORMAL HIGH (ref 0.61–1.24)
GFR calc Af Amer: 38 mL/min — ABNORMAL LOW (ref >60)
GFR calc non Af Amer: 33 mL/min — ABNORMAL LOW (ref >60)
Glucose, Bld: 96 mg/dL (ref 70–99)
Potassium: 5.2 mmol/L — ABNORMAL HIGH (ref 3.5–5.1)
Sodium: 134 mmol/L — ABNORMAL LOW (ref 135–145)

## 2018-03-05 LAB — ALDOSTERONE + RENIN ACTIVITY W/ RATIO
ALDO / PRA Ratio: 1.2 (ref 0.0–30.0)
Aldosterone: 3.3 ng/dL (ref 0.0–30.0)
PRA LC/MS/MS: 2.765 ng/mL/hr (ref 0.167–5.380)

## 2018-03-05 LAB — POCT I-STAT TROPONIN I: Troponin i, poc: 0.02 ng/mL (ref 0.00–0.08)

## 2018-03-05 LAB — POTASSIUM: Potassium: 5.1 mmol/L (ref 3.5–5.1)

## 2018-03-05 MED ORDER — ALBUTEROL SULFATE (2.5 MG/3ML) 0.083% IN NEBU: 5.0000 mg | INHALATION_SOLUTION | Freq: Once | RESPIRATORY_TRACT | Status: DC

## 2018-03-05 MED ORDER — HYDRALAZINE HCL 50 MG PO TABS
100.0000 mg | ORAL_TABLET | Freq: Once | ORAL | Status: AC
  Administered 2018-03-05: 100 mg via ORAL
  Filled 2018-03-05: qty 2

## 2018-03-05 MED ORDER — HYDROCODONE-ACETAMINOPHEN 5-325 MG PO TABS
1.0000 | ORAL_TABLET | Freq: Once | ORAL | Status: AC
  Administered 2018-03-05: 1 via ORAL
  Filled 2018-03-05: qty 1

## 2018-03-05 MED ORDER — SODIUM CHLORIDE 0.9 % IV BOLUS
500.0000 mL | Freq: Once | INTRAVENOUS | Status: AC
  Administered 2018-03-05: 500 mL via INTRAVENOUS

## 2018-03-05 NOTE — Discharge Instructions (Signed)
Please continue your home medications as previously prescribed. Return to ED for worsening symptoms, severe chest pain or abdominal pain, vomiting or coughing up blood, leg swelling, lightheadedness or loss of consciousness.

## 2018-03-05 NOTE — ED Triage Notes (Signed)
Patient c/o SOB and dizziness. Patient has SOB at rest. Patient states he was discharged from the hospital for hypertension 4 days ago. Patient denies any CP or cough.
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
## 2018-03-05 NOTE — ED Notes (Signed)
Pt states he is feeling better. Resting in bed watching tv.

## 2018-03-06 ENCOUNTER — Other Ambulatory Visit: Payer: Self-pay

## 2018-03-06 ENCOUNTER — Emergency Department (HOSPITAL_COMMUNITY): Payer: Medicaid Other

## 2018-03-06 ENCOUNTER — Emergency Department (HOSPITAL_COMMUNITY)
Admission: EM | Admit: 2018-03-06 | Discharge: 2018-03-06 | Disposition: A | Discharge status: Home / Self Care | Payer: Medicaid Other | Attending: Emergency Medicine | Admitting: Emergency Medicine

## 2018-03-06 DIAGNOSIS — I129 Hypertensive chronic kidney disease with stage 1 through stage 4 chronic kidney disease, or unspecified chronic kidney disease: Secondary | ICD-10-CM | POA: Diagnosis not present

## 2018-03-06 DIAGNOSIS — R0602 Shortness of breath: Secondary | ICD-10-CM | POA: Diagnosis not present

## 2018-03-06 DIAGNOSIS — N289 Disorder of kidney and ureter, unspecified: Secondary | ICD-10-CM | POA: Diagnosis not present

## 2018-03-06 DIAGNOSIS — N183 Chronic kidney disease, stage 3 (moderate): Secondary | ICD-10-CM | POA: Insufficient documentation

## 2018-03-06 DIAGNOSIS — Z79899 Other long term (current) drug therapy: Secondary | ICD-10-CM | POA: Diagnosis not present

## 2018-03-06 DIAGNOSIS — J40 Bronchitis, not specified as acute or chronic: Secondary | ICD-10-CM

## 2018-03-06 DIAGNOSIS — Z7982 Long term (current) use of aspirin: Secondary | ICD-10-CM | POA: Diagnosis not present

## 2018-03-06 DIAGNOSIS — I10 Essential (primary) hypertension: Secondary | ICD-10-CM

## 2018-03-06 LAB — I-STAT TROPONIN, ED
Troponin i, poc: 0.03 ng/mL (ref 0.00–0.08)
Troponin i, poc: 0.03 ng/mL (ref 0.00–0.08)

## 2018-03-06 LAB — COMPREHENSIVE METABOLIC PANEL
ALT: 24 U/L (ref 0–44)
AST: 21 U/L (ref 15–41)
Albumin: 3.7 g/dL (ref 3.5–5.0)
Alkaline Phosphatase: 43 U/L (ref 38–126)
Anion gap: 11 (ref 5–15)
BUN: 43 mg/dL — ABNORMAL HIGH (ref 8–23)
CO2: 21 mmol/L — ABNORMAL LOW (ref 22–32)
Calcium: 9.3 mg/dL (ref 8.9–10.3)
Chloride: 103 mmol/L (ref 98–111)
Creatinine, Ser: 2.3 mg/dL — ABNORMAL HIGH (ref 0.61–1.24)
GFR calc Af Amer: 34 mL/min — ABNORMAL LOW (ref >60)
GFR calc non Af Amer: 29 mL/min — ABNORMAL LOW (ref >60)
Glucose, Bld: 103 mg/dL — ABNORMAL HIGH (ref 70–99)
Potassium: 4.8 mmol/L (ref 3.5–5.1)
Sodium: 135 mmol/L (ref 135–145)
Total Bilirubin: 0.9 mg/dL (ref 0.3–1.2)
Total Protein: 7.4 g/dL (ref 6.5–8.1)

## 2018-03-06 LAB — CBC WITH DIFFERENTIAL/PLATELET
Abs Immature Granulocytes: 0.1 10*3/uL (ref 0.0–0.1)
Basophils Absolute: 0 10*3/uL (ref 0.0–0.1)
Basophils Relative: 1 %
Eosinophils Absolute: 0 10*3/uL (ref 0.0–0.7)
Eosinophils Relative: 0 %
HCT: 50.4 % (ref 39.0–52.0)
Hemoglobin: 16.9 g/dL (ref 13.0–17.0)
Immature Granulocytes: 1 %
Lymphocytes Relative: 22 %
Lymphs Abs: 1.7 10*3/uL (ref 0.7–4.0)
MCH: 31 pg (ref 26.0–34.0)
MCHC: 33.5 g/dL (ref 30.0–36.0)
MCV: 92.5 fL (ref 78.0–100.0)
Monocytes Absolute: 0.5 10*3/uL (ref 0.1–1.0)
Monocytes Relative: 7 %
Neutro Abs: 5.1 10*3/uL (ref 1.7–7.7)
Neutrophils Relative %: 69 %
Platelets: 286 10*3/uL (ref 150–400)
RBC: 5.45 MIL/uL (ref 4.22–5.81)
RDW: 12 % (ref 11.5–15.5)
WBC: 7.4 10*3/uL (ref 4.0–10.5)

## 2018-03-06 LAB — URINALYSIS, ROUTINE W REFLEX MICROSCOPIC
Bacteria, UA: NONE SEEN
Bilirubin Urine: NEGATIVE
Glucose, UA: NEGATIVE mg/dL
Hgb urine dipstick: NEGATIVE
Ketones, ur: NEGATIVE mg/dL
Leukocytes, UA: NEGATIVE
Nitrite: NEGATIVE
Protein, ur: 30 mg/dL — AB
Specific Gravity, Urine: 1.01 (ref 1.005–1.030)
pH: 5 (ref 5.0–8.0)

## 2018-03-06 MED ORDER — TECHNETIUM TC 99M DIETHYLENETRIAME-PENTAACETIC ACID
32.0000 | Freq: Once | INTRAVENOUS | Status: AC | PRN
  Administered 2018-03-06: 32 via RESPIRATORY_TRACT

## 2018-03-06 MED ORDER — HYDRALAZINE HCL 25 MG PO TABS
100.0000 mg | ORAL_TABLET | Freq: Once | ORAL | Status: AC
  Administered 2018-03-06: 100 mg via ORAL
  Filled 2018-03-06: qty 4

## 2018-03-06 MED ORDER — SODIUM CHLORIDE 0.9 % IV BOLUS
1000.0000 mL | Freq: Once | INTRAVENOUS | Status: AC
  Administered 2018-03-06: 1000 mL via INTRAVENOUS

## 2018-03-06 MED ORDER — ALBUTEROL SULFATE (2.5 MG/3ML) 0.083% IN NEBU
5.0000 mg | INHALATION_SOLUTION | Freq: Once | RESPIRATORY_TRACT | Status: AC
  Administered 2018-03-06: 5 mg via RESPIRATORY_TRACT
  Filled 2018-03-06: qty 6

## 2018-03-06 MED ORDER — IPRATROPIUM BROMIDE 0.02 % IN SOLN
0.5000 mg | Freq: Once | RESPIRATORY_TRACT | Status: AC
  Administered 2018-03-06: 0.5 mg via RESPIRATORY_TRACT
  Filled 2018-03-06: qty 2.5

## 2018-03-06 MED ORDER — ALBUTEROL SULFATE HFA 108 (90 BASE) MCG/ACT IN AERS
2.0000 | INHALATION_SPRAY | Freq: Once | RESPIRATORY_TRACT | Status: AC
  Administered 2018-03-06: 2 via RESPIRATORY_TRACT
  Filled 2018-03-06: qty 6.7

## 2018-03-06 MED ORDER — TECHNETIUM TO 99M ALBUMIN AGGREGATED
4.1500 | Freq: Once | INTRAVENOUS | Status: AC | PRN
  Administered 2018-03-06: 4.15 via INTRAVENOUS
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
## 2018-03-06 NOTE — ED Notes (Signed)
Patient transported to nuclear medicine 

## 2018-03-06 NOTE — ED Triage Notes (Signed)
Pt arrived via [facility] em[facility] from home c/o SOB and generalized weakness. Pt was seen at [facility] yesterday for same complaint. Also Recently discharged from [facility] after 6 day admit for HTN, per EMS. Pt presents today with SOB, all lung fields clear at time of triage. Pt is alert and oriented. Placed in [place] bed.  EMS v/s 152/98 Hr 84 Sp02 100%ra cbg 128

## 2018-03-06 NOTE — ED Notes (Signed)
ED Provider at bedside. 

## 2018-03-06 NOTE — Discharge Instructions (Signed)
Use albuterol every 4-6 hrs as needed for cough or shortness of breath.,   Your kidney function is slightly abnormal likely from dehydration. Drink plenty of fluids. Repeat kidney function with your doctor in a week   See your doctor next week   Return to ER if you have worse shortness of breath, chest pain.

## 2018-03-13 ENCOUNTER — Emergency Department (HOSPITAL_COMMUNITY)
Admission: EM | Admit: 2018-03-13 | Discharge: 2018-03-14 | Disposition: A | Discharge status: Home / Self Care | Payer: Medicaid Other | Attending: Emergency Medicine | Admitting: Emergency Medicine

## 2018-03-13 ENCOUNTER — Emergency Department (HOSPITAL_COMMUNITY): Payer: Medicaid Other

## 2018-03-13 ENCOUNTER — Other Ambulatory Visit: Payer: Self-pay

## 2018-03-13 ENCOUNTER — Encounter (HOSPITAL_COMMUNITY): Payer: Self-pay | Admitting: Emergency Medicine

## 2018-03-13 DIAGNOSIS — I129 Hypertensive chronic kidney disease with stage 1 through stage 4 chronic kidney disease, or unspecified chronic kidney disease: Secondary | ICD-10-CM | POA: Diagnosis not present

## 2018-03-13 DIAGNOSIS — Z79899 Other long term (current) drug therapy: Secondary | ICD-10-CM | POA: Insufficient documentation

## 2018-03-13 DIAGNOSIS — R0600 Dyspnea, unspecified: Secondary | ICD-10-CM | POA: Diagnosis not present

## 2018-03-13 DIAGNOSIS — Z7982 Long term (current) use of aspirin: Secondary | ICD-10-CM | POA: Diagnosis not present

## 2018-03-13 DIAGNOSIS — R0602 Shortness of breath: Secondary | ICD-10-CM | POA: Diagnosis present

## 2018-03-13 DIAGNOSIS — N183 Chronic kidney disease, stage 3 (moderate): Secondary | ICD-10-CM | POA: Insufficient documentation

## 2018-03-13 MED ORDER — ALBUTEROL SULFATE (2.5 MG/3ML) 0.083% IN NEBU
5.0000 mg | INHALATION_SOLUTION | Freq: Once | RESPIRATORY_TRACT | Status: DC
  Filled 2018-03-13: qty 6
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
## 2018-03-14 LAB — CBC WITH DIFFERENTIAL/PLATELET
Abs Immature Granulocytes: 0 10*3/uL (ref 0.0–0.1)
Basophils Absolute: 0.1 10*3/uL (ref 0.0–0.1)
Basophils Relative: 1 %
Eosinophils Absolute: 0.1 10*3/uL (ref 0.0–0.7)
Eosinophils Relative: 1 %
HCT: 47.4 % (ref 39.0–52.0)
Hemoglobin: 15.7 g/dL (ref 13.0–17.0)
Immature Granulocytes: 0 %
Lymphocytes Relative: 31 %
Lymphs Abs: 2.3 10*3/uL (ref 0.7–4.0)
MCH: 31.7 pg (ref 26.0–34.0)
MCHC: 33.1 g/dL (ref 30.0–36.0)
MCV: 95.6 fL (ref 78.0–100.0)
Monocytes Absolute: 0.3 10*3/uL (ref 0.1–1.0)
Monocytes Relative: 5 %
Neutro Abs: 4.7 10*3/uL (ref 1.7–7.7)
Neutrophils Relative %: 62 %
Platelets: 267 10*3/uL (ref 150–400)
RBC: 4.96 MIL/uL (ref 4.22–5.81)
RDW: 11.9 % (ref 11.5–15.5)
WBC: 7.4 10*3/uL (ref 4.0–10.5)

## 2018-03-14 LAB — BASIC METABOLIC PANEL
Anion gap: 11 (ref 5–15)
BUN: 23 mg/dL (ref 8–23)
CO2: 26 mmol/L (ref 22–32)
Calcium: 9.7 mg/dL (ref 8.9–10.3)
Chloride: 100 mmol/L (ref 98–111)
Creatinine, Ser: 1.91 mg/dL — ABNORMAL HIGH (ref 0.61–1.24)
GFR calc Af Amer: 42 mL/min — ABNORMAL LOW (ref >60)
GFR calc non Af Amer: 36 mL/min — ABNORMAL LOW (ref >60)
Glucose, Bld: 97 mg/dL (ref 70–99)
Potassium: 4.6 mmol/L (ref 3.5–5.1)
Sodium: 137 mmol/L (ref 135–145)

## 2018-03-14 LAB — I-STAT TROPONIN, ED: Troponin i, poc: 0 ng/mL (ref 0.00–0.08)

## 2018-03-14 MED ORDER — IPRATROPIUM-ALBUTEROL 0.5-2.5 (3) MG/3ML IN SOLN
3.0000 mL | Freq: Once | RESPIRATORY_TRACT | Status: AC
  Administered 2018-03-14: 3 mL via RESPIRATORY_TRACT
  Filled 2018-03-14: qty 3

## 2018-03-14 NOTE — Discharge Instructions (Addendum)
Continue medications as before.  Follow-up with your primary doctor if symptoms are not improving in the next week.
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
## 2018-03-20 ENCOUNTER — Ambulatory Visit: Payer: Medicaid Other | Attending: Nurse Practitioner | Admitting: Nurse Practitioner

## 2018-03-20 ENCOUNTER — Encounter: Payer: Self-pay | Admitting: Nurse Practitioner

## 2018-03-20 VITALS — BP 135/90 | HR 54 | Temp 97.9°F | Ht 66.0 in | Wt 165.8 lb

## 2018-03-20 DIAGNOSIS — M25511 Pain in right shoulder: Secondary | ICD-10-CM | POA: Insufficient documentation

## 2018-03-20 DIAGNOSIS — K219 Gastro-esophageal reflux disease without esophagitis: Secondary | ICD-10-CM | POA: Insufficient documentation

## 2018-03-20 DIAGNOSIS — R0981 Nasal congestion: Secondary | ICD-10-CM | POA: Diagnosis not present

## 2018-03-20 DIAGNOSIS — I10 Essential (primary) hypertension: Secondary | ICD-10-CM | POA: Insufficient documentation

## 2018-03-20 DIAGNOSIS — I129 Hypertensive chronic kidney disease with stage 1 through stage 4 chronic kidney disease, or unspecified chronic kidney disease: Secondary | ICD-10-CM | POA: Diagnosis not present

## 2018-03-20 MED ORDER — DICLOFENAC SODIUM 1 % TD GEL: 4.0000 g | Freq: Four times a day (QID) | TRANSDERMAL | 0 refills | Status: DC

## 2018-03-20 MED ORDER — FLUTICASONE PROPIONATE 50 MCG/ACT NA SUSP: 2.0000 | Freq: Every day | NASAL | 6 refills | Status: DC

## 2018-03-20 MED ORDER — ASPIRIN 81 MG PO CHEW: 81.0000 mg | CHEWABLE_TABLET | Freq: Every day | ORAL | 1 refills | Status: DC

## 2018-03-20 MED ORDER — TRAMADOL HCL 50 MG PO TABS: 50.0000 mg | ORAL_TABLET | Freq: Four times a day (QID) | ORAL | 0 refills | Status: AC | PRN

## 2018-03-20 MED ORDER — LISINOPRIL 40 MG PO TABS: 40.0000 mg | ORAL_TABLET | Freq: Every day | ORAL | 1 refills | Status: DC

## 2018-03-20 MED ORDER — ALBUTEROL SULFATE HFA 108 (90 BASE) MCG/ACT IN AERS: 2.0000 | INHALATION_SPRAY | Freq: Four times a day (QID) | RESPIRATORY_TRACT | 0 refills | Status: DC | PRN

## 2018-03-20 MED ORDER — AMLODIPINE BESYLATE 10 MG PO TABS: 10.0000 mg | ORAL_TABLET | Freq: Every day | ORAL | 1 refills | Status: DC

## 2018-03-20 MED FILL — traMADol HCL 50 MG TABS: 50 | 15 days supply | Qty: 60 | Fill #0

## 2018-03-20 MED FILL — LISINOPRIL 40 MG TABLET: 40 | 30 days supply | Qty: 30 | Fill #0

## 2018-03-20 MED FILL — FLUTICASONE PROP 50 MCG SPR: 50 | 30 days supply | Qty: 16 | Fill #0

## 2018-03-20 MED FILL — AMLODIPINE BESYLATE 10 MG T: 10 | 30 days supply | Qty: 30 | Fill #0

## 2018-03-20 MED FILL — DICLOFENAC SODIUM 1% GEL: 1 | 6 days supply | Qty: 100 | Fill #0

## 2018-03-20 MED FILL — ALBUTEROL SULFATE HFA 108 (: 108 (90 BAS | 25 days supply | Qty: 9 | Fill #0
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
## 2018-03-25 ENCOUNTER — Encounter (INDEPENDENT_AMBULATORY_CARE_PROVIDER_SITE_OTHER): Payer: Self-pay | Admitting: Orthopedic Surgery

## 2018-03-25 ENCOUNTER — Ambulatory Visit (INDEPENDENT_AMBULATORY_CARE_PROVIDER_SITE_OTHER): Payer: Self-pay | Admitting: Orthopedic Surgery

## 2018-03-25 ENCOUNTER — Telehealth: Payer: Self-pay | Admitting: *Deleted

## 2018-03-25 DIAGNOSIS — M7551 Bursitis of right shoulder: Secondary | ICD-10-CM

## 2018-03-26 ENCOUNTER — Encounter (INDEPENDENT_AMBULATORY_CARE_PROVIDER_SITE_OTHER): Payer: Self-pay | Admitting: Orthopedic Surgery
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
## 2018-03-31 MED FILL — traMADol HCL 50 MG TABS: 50 | 15 days supply | Qty: 60 | Fill #0

## 2018-04-02 ENCOUNTER — Encounter: Payer: Self-pay | Admitting: Physician Assistant

## 2018-04-02 ENCOUNTER — Ambulatory Visit (INDEPENDENT_AMBULATORY_CARE_PROVIDER_SITE_OTHER): Payer: Self-pay | Admitting: Physician Assistant

## 2018-04-02 VITALS — BP 120/80 | HR 75 | Ht 66.0 in | Wt 165.2 lb

## 2018-04-02 DIAGNOSIS — E785 Hyperlipidemia, unspecified: Secondary | ICD-10-CM

## 2018-04-02 DIAGNOSIS — N183 Chronic kidney disease, stage 3 unspecified: Secondary | ICD-10-CM

## 2018-04-02 DIAGNOSIS — Z9114 Patient's other noncompliance with medication regimen: Secondary | ICD-10-CM

## 2018-04-02 DIAGNOSIS — I10 Essential (primary) hypertension: Secondary | ICD-10-CM

## 2018-04-02 DIAGNOSIS — R200 Anesthesia of skin: Secondary | ICD-10-CM

## 2018-04-02 NOTE — Patient Instructions (Signed)
Medication Instructions:  Your physician recommends that you continue on your current medications as directed. Please refer to the Current Medication list given to you today.   Labwork: Bmet today  Testing/Procedures: None ordered  Follow-Up: Your physician recommends that you schedule a follow-up appointment in: 3-4 weeks   Any Other Special Instructions Will Be Listed Below (If Applicable). Your physician has requested that you  monitor and record your blood pressure readings at home twice a day. Please use the same machine at the same time of day to check your readings and record them to bring to your follow-up visit. Take your first reading 2 hours after your have taken you blood pressure medication. Take your second every evening at the same time every evening.       If you need a refill on your cardiac medications before your next appointment, please call your pharmacy.
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
## 2018-04-03 LAB — BASIC METABOLIC PANEL
BUN/Creatinine Ratio: 20 (ref 10–24)
BUN: 44 mg/dL — ABNORMAL HIGH (ref 8–27)
CO2: 20 mmol/L (ref 20–29)
Calcium: 10.6 mg/dL — ABNORMAL HIGH (ref 8.6–10.2)
Chloride: 99 mmol/L (ref 96–106)
Creatinine, Ser: 2.18 mg/dL — ABNORMAL HIGH (ref 0.76–1.27)
GFR calc Af Amer: 36 mL/min/{1.73_m2} — ABNORMAL LOW (ref >59)
GFR calc non Af Amer: 32 mL/min/{1.73_m2} — ABNORMAL LOW (ref >59)
Glucose: 86 mg/dL (ref 65–99)
Potassium: 5.3 mmol/L — ABNORMAL HIGH (ref 3.5–5.2)
Sodium: 136 mmol/L (ref 134–144)

## 2018-04-10 ENCOUNTER — Telehealth: Payer: Self-pay | Admitting: Nurse Practitioner

## 2018-04-10 ENCOUNTER — Other Ambulatory Visit: Payer: Self-pay | Admitting: Nurse Practitioner

## 2018-04-10 ENCOUNTER — Ambulatory Visit: Payer: Medicaid Other | Attending: Nurse Practitioner

## 2018-04-10 DIAGNOSIS — I10 Essential (primary) hypertension: Secondary | ICD-10-CM

## 2018-04-10 DIAGNOSIS — Z9114 Patient's other noncompliance with medication regimen: Secondary | ICD-10-CM | POA: Diagnosis not present

## 2018-04-10 NOTE — Telephone Encounter (Signed)
Pt came in to request a letter for work, he needs it to state that he cannot do any heavy lifting due to his shoulder pain. Please follow up with pt about this letter.

## 2018-04-10 NOTE — Telephone Encounter (Signed)
Pt came in to request a medication refill on -metoprolol tartrate (LOPRESSOR) 50 MG tablet  To [facility]. Please follow up

## 2018-04-11 LAB — CMP14+EGFR
ALT: 8 IU/L (ref 0–44)
AST: 13 IU/L (ref 0–40)
Albumin/Globulin Ratio: 1.4 (ref 1.2–2.2)
Albumin: 4.3 g/dL (ref 3.6–4.8)
Alkaline Phosphatase: 62 IU/L (ref 39–117)
BUN/Creatinine Ratio: 15 (ref 10–24)
BUN: 27 mg/dL (ref 8–27)
Bilirubin Total: <0.2 mg/dL (ref 0.0–1.2)
CO2: 19 mmol/L — ABNORMAL LOW (ref 20–29)
Calcium: 10 mg/dL (ref 8.6–10.2)
Chloride: 97 mmol/L (ref 96–106)
Creatinine, Ser: 1.81 mg/dL — ABNORMAL HIGH (ref 0.76–1.27)
GFR calc Af Amer: 46 mL/min/{1.73_m2} — ABNORMAL LOW (ref >59)
GFR calc non Af Amer: 39 mL/min/{1.73_m2} — ABNORMAL LOW (ref >59)
Globulin, Total: 3.1 g/dL (ref 1.5–4.5)
Glucose: 91 mg/dL (ref 65–99)
Potassium: 5 mmol/L (ref 3.5–5.2)
Sodium: 138 mmol/L (ref 134–144)
Total Protein: 7.4 g/dL (ref 6.0–8.5)

## 2018-04-12 MED ORDER — METOPROLOL TARTRATE 50 MG PO TABS: 50.0000 mg | ORAL_TABLET | Freq: Two times a day (BID) | ORAL | 0 refills | Status: DC

## 2018-04-14 ENCOUNTER — Telehealth (INDEPENDENT_AMBULATORY_CARE_PROVIDER_SITE_OTHER): Payer: Self-pay | Admitting: Orthopedic Surgery

## 2018-04-14 NOTE — Telephone Encounter (Signed)
Ok for note 

## 2018-04-14 NOTE — Telephone Encounter (Signed)
Note written, I tried calling patient to advise done. Number has been disconnected.

## 2018-04-14 NOTE — Telephone Encounter (Signed)
y

## 2018-04-14 NOTE — Telephone Encounter (Signed)
Patient called requesting a RTW note to be on light duty.  CB#[phone].  Thank you.

## 2018-04-17 ENCOUNTER — Telehealth: Payer: Self-pay | Admitting: Physician Assistant

## 2018-04-17 DIAGNOSIS — I10 Essential (primary) hypertension: Secondary | ICD-10-CM

## 2018-04-17 DIAGNOSIS — N184 Chronic kidney disease, stage 4 (severe): Secondary | ICD-10-CM

## 2018-04-17 NOTE — Telephone Encounter (Signed)
Called patient, gave lab results, advised of referral sent to [facility], also ordered BMET advised him to come first thing next week to redraw. Patient verbalized understanding.

## 2018-04-17 NOTE — Telephone Encounter (Signed)
New message ° ° °Patient is returning call for lab results. °

## 2018-04-20 ENCOUNTER — Other Ambulatory Visit: Payer: Self-pay

## 2018-04-20 DIAGNOSIS — I10 Essential (primary) hypertension: Secondary | ICD-10-CM

## 2018-04-21 LAB — BASIC METABOLIC PANEL
BUN/Creatinine Ratio: 17 (ref 10–24)
BUN: 39 mg/dL — ABNORMAL HIGH (ref 8–27)
CO2: 20 mmol/L (ref 20–29)
Calcium: 9.6 mg/dL (ref 8.6–10.2)
Chloride: 100 mmol/L (ref 96–106)
Creatinine, Ser: 2.24 mg/dL — ABNORMAL HIGH (ref 0.76–1.27)
GFR calc Af Amer: 35 mL/min/{1.73_m2} — ABNORMAL LOW (ref >59)
GFR calc non Af Amer: 31 mL/min/{1.73_m2} — ABNORMAL LOW (ref >59)
Glucose: 96 mg/dL (ref 65–99)
Potassium: 4.8 mmol/L (ref 3.5–5.2)
Sodium: 134 mmol/L (ref 134–144)

## 2018-04-23 ENCOUNTER — Other Ambulatory Visit: Payer: Self-pay

## 2018-04-23 ENCOUNTER — Encounter: Payer: Self-pay | Admitting: Physician Assistant

## 2018-04-23 ENCOUNTER — Ambulatory Visit (INDEPENDENT_AMBULATORY_CARE_PROVIDER_SITE_OTHER): Payer: Self-pay | Admitting: Physician Assistant

## 2018-04-23 VITALS — BP 134/78 | HR 59 | Ht 66.0 in | Wt 165.0 lb

## 2018-04-23 DIAGNOSIS — I10 Essential (primary) hypertension: Secondary | ICD-10-CM

## 2018-04-23 DIAGNOSIS — E785 Hyperlipidemia, unspecified: Secondary | ICD-10-CM

## 2018-04-23 DIAGNOSIS — N184 Chronic kidney disease, stage 4 (severe): Secondary | ICD-10-CM

## 2018-04-23 MED ORDER — CARVEDILOL 6.25 MG PO TABS: 6.2500 mg | ORAL_TABLET | Freq: Two times a day (BID) | ORAL | 1 refills | Status: DC

## 2018-04-23 NOTE — Patient Instructions (Signed)
Medication Instructions:  Stop Metoprolol Start Coreg 6.25 mg twice daily. If you need a refill on your cardiac medications before your next appointment, please call your pharmacy.   Lab work: None If you have labs (blood work) drawn today and your tests are completely normal, you will receive your results only by: Marland Kitchen MyChart Message (if you have MyChart) OR . A paper copy in the mail If you have any lab test that is abnormal or we need to change your treatment, we will call you to review the results.  Testing/Procedures: None  Follow-Up: At [facility], you and your health needs are our priority.  As part of our continuing mission to provide you with exceptional heart care, we have created designated Provider Care Teams.  These Care Teams include your primary Cardiologist (physician) and Advanced Practice Providers (APPs -  Physician Assistants and Nurse Practitioners) who all work together to provide you with the care you need, when you need it. . Follow up with Dr.Ross in 3 Months . Follow up with Hypertension Clinic in 2 Months  Any Other Special Instructions Will Be Listed Below (If Applicable). Referral to [facility] was sent, Provider is Dr. Edrick Oh.
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
## 2018-04-24 MED FILL — CARVEDILOL 6.25 MG TABLET: 6.25 | 30 days supply | Qty: 60 | Fill #0

## 2018-04-25 ENCOUNTER — Encounter: Payer: Self-pay | Admitting: Physician Assistant

## 2018-04-26 NOTE — Telephone Encounter (Signed)
He received a letter from ortho stating he could return to light duty on 04-15-2018

## 2018-05-01 ENCOUNTER — Ambulatory Visit: Payer: Self-pay | Admitting: Nurse Practitioner

## 2018-05-01 MED FILL — AMLODIPINE BESYLATE 10 MG T: 10 | 30 days supply | Qty: 30 | Fill #1

## 2018-05-01 MED FILL — LISINOPRIL 40 MG TABLET: 40 | 30 days supply | Qty: 30 | Fill #1

## 2018-05-13 ENCOUNTER — Ambulatory Visit: Payer: Medicaid Other | Attending: Nurse Practitioner | Admitting: Nurse Practitioner

## 2018-05-13 ENCOUNTER — Encounter: Payer: Self-pay | Admitting: Nurse Practitioner

## 2018-05-13 VITALS — BP 145/96 | HR 53 | Temp 97.8°F | Ht 66.0 in | Wt 169.0 lb

## 2018-05-13 DIAGNOSIS — Z7982 Long term (current) use of aspirin: Secondary | ICD-10-CM | POA: Insufficient documentation

## 2018-05-13 DIAGNOSIS — Z760 Encounter for issue of repeat prescription: Secondary | ICD-10-CM | POA: Insufficient documentation

## 2018-05-13 DIAGNOSIS — I10 Essential (primary) hypertension: Secondary | ICD-10-CM | POA: Diagnosis not present

## 2018-05-13 DIAGNOSIS — M7551 Bursitis of right shoulder: Secondary | ICD-10-CM | POA: Diagnosis not present

## 2018-05-13 DIAGNOSIS — Z79899 Other long term (current) drug therapy: Secondary | ICD-10-CM | POA: Diagnosis not present

## 2018-05-13 DIAGNOSIS — N289 Disorder of kidney and ureter, unspecified: Secondary | ICD-10-CM | POA: Diagnosis not present

## 2018-05-13 DIAGNOSIS — Z8249 Family history of ischemic heart disease and other diseases of the circulatory system: Secondary | ICD-10-CM | POA: Insufficient documentation

## 2018-05-13 DIAGNOSIS — Z09 Encounter for follow-up examination after completed treatment for conditions other than malignant neoplasm: Secondary | ICD-10-CM | POA: Diagnosis not present

## 2018-05-13 MED ORDER — SPIRONOLACTONE 50 MG PO TABS: 50.0000 mg | ORAL_TABLET | Freq: Two times a day (BID) | ORAL | 1 refills | Status: DC

## 2018-05-13 MED ORDER — HYDRALAZINE HCL 100 MG PO TABS: 100.0000 mg | ORAL_TABLET | Freq: Three times a day (TID) | ORAL | 0 refills | Status: DC
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
## 2018-05-13 NOTE — Patient Instructions (Signed)
Dr. Yetta Barre. [facility] Orthopedic surgery  [address], [place], [place] [address]  [phone]

## 2018-05-14 LAB — BASIC METABOLIC PANEL
BUN/Creatinine Ratio: 13 (ref 10–24)
BUN: 21 mg/dL (ref 8–27)
CO2: 23 mmol/L (ref 20–29)
Calcium: 9.8 mg/dL (ref 8.6–10.2)
Chloride: 101 mmol/L (ref 96–106)
Creatinine, Ser: 1.68 mg/dL — ABNORMAL HIGH (ref 0.76–1.27)
GFR calc Af Amer: 50 mL/min/{1.73_m2} — ABNORMAL LOW (ref >59)
GFR calc non Af Amer: 43 mL/min/{1.73_m2} — ABNORMAL LOW (ref >59)
Glucose: 82 mg/dL (ref 65–99)
Potassium: 4.1 mmol/L (ref 3.5–5.2)
Sodium: 140 mmol/L (ref 134–144)

## 2018-05-18 ENCOUNTER — Encounter: Payer: Self-pay | Admitting: Physical Therapy

## 2018-05-18 ENCOUNTER — Other Ambulatory Visit: Payer: Self-pay

## 2018-05-18 ENCOUNTER — Ambulatory Visit: Payer: Medicaid Other | Attending: Nurse Practitioner | Admitting: Physical Therapy

## 2018-05-18 ENCOUNTER — Telehealth: Payer: Self-pay | Admitting: Nurse Practitioner

## 2018-05-18 ENCOUNTER — Telehealth: Payer: Self-pay

## 2018-05-18 DIAGNOSIS — M25611 Stiffness of right shoulder, not elsewhere classified: Secondary | ICD-10-CM | POA: Insufficient documentation

## 2018-05-18 DIAGNOSIS — M6281 Muscle weakness (generalized): Secondary | ICD-10-CM | POA: Insufficient documentation

## 2018-05-18 DIAGNOSIS — M25511 Pain in right shoulder: Secondary | ICD-10-CM | POA: Insufficient documentation

## 2018-05-18 NOTE — Telephone Encounter (Signed)
Patient called to get their lab results. Please follow up with patient.

## 2018-05-18 NOTE — Patient Instructions (Signed)
Access Code: [email or web address]  URL: [email or web address]  Date: 05/18/2018  Prepared by: Earlie Counts   Exercises  Seated Shoulder Pendulum Exercise - 10 reps - 1 sets - 3x daily - 7x weekly  Shoulder External Rotation and Scapular Retraction - 10 reps - 1 sets - 1x daily - 7x weekly  Supine Shoulder Flexion AAROM with Hands Clasped - 10 reps - 1 sets - 3x daily - 7x weekly  [facility] [address] [place], [place] [address] Phone # [phone] Fax [phone]

## 2018-05-18 NOTE — Telephone Encounter (Signed)
CMA attempt to reach patient to inform on results.  No answer and left a VM for patient to call back.  If patient call back, please inform:  Kidney function is improving. Will continue to monitor.

## 2018-05-18 NOTE — Telephone Encounter (Signed)
-----   Message from [name], NP sent at 05/14/2018  8:26 PM EST ----- Kidney function is improving. Will continue to monitor.

## 2018-05-18 NOTE — Telephone Encounter (Signed)
CMA attempt to reach patient to inform on lab results.  No answer and left a VM for patient.
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
## 2018-05-20 ENCOUNTER — Ambulatory Visit: Payer: Medicaid Other | Admitting: Physical Therapy

## 2018-05-26 ENCOUNTER — Encounter: Payer: Self-pay | Admitting: Physical Therapy

## 2018-05-26 ENCOUNTER — Ambulatory Visit: Payer: Medicaid Other | Admitting: Physical Therapy

## 2018-05-26 DIAGNOSIS — M25611 Stiffness of right shoulder, not elsewhere classified: Secondary | ICD-10-CM

## 2018-05-26 DIAGNOSIS — M25511 Pain in right shoulder: Secondary | ICD-10-CM | POA: Diagnosis not present

## 2018-05-26 DIAGNOSIS — M6281 Muscle weakness (generalized): Secondary | ICD-10-CM
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
## 2018-05-26 NOTE — Patient Instructions (Signed)
Access Code: [email or web address]  URL: [email or web address]  Date: 05/26/2018  Prepared by: Venetia Night Annia Gomm   Exercises  Supine Shoulder Flexion Extension AAROM with Dowel - 10 reps - 3 sets - 1x daily - 7x weekly  Supine Shoulder Horizontal Abduction with Resistance - 10 reps - 3 sets - 1x daily - 7x weekly  Standing Backward Shoulder Rolls - 10 reps - 3 sets - 1x daily - 7x weekly  Seated Scapular Retraction - 10 reps - 3 sets - 3 hold - 1x daily - 7x weekly  Ulnar Nerve Flossing - 10 reps - 3 sets - 1x daily - 7x weekly

## 2018-06-02 MED FILL — AMLODIPINE BESYLATE 10 MG T: 10 | 30 days supply | Qty: 30 | Fill #2

## 2018-06-02 MED FILL — LISINOPRIL 40 MG TABLET: 40 | 30 days supply | Qty: 30 | Fill #2

## 2018-06-02 MED FILL — hydrALAZINE HCL 100 MG TABS: 100 | 30 days supply | Qty: 90 | Fill #0

## 2018-06-02 MED FILL — SPIRONOLACTONE 50 MG TABLET: 50 | 30 days supply | Qty: 60 | Fill #0

## 2018-06-03 ENCOUNTER — Ambulatory Visit: Payer: Medicaid Other | Attending: Nurse Practitioner | Admitting: Physical Therapy

## 2018-06-03 ENCOUNTER — Encounter: Payer: Self-pay | Admitting: Physical Therapy

## 2018-06-03 DIAGNOSIS — M25511 Pain in right shoulder: Secondary | ICD-10-CM | POA: Insufficient documentation

## 2018-06-03 DIAGNOSIS — M6281 Muscle weakness (generalized): Secondary | ICD-10-CM

## 2018-06-03 DIAGNOSIS — M25611 Stiffness of right shoulder, not elsewhere classified: Secondary | ICD-10-CM | POA: Insufficient documentation

## 2018-06-03 NOTE — Patient Instructions (Signed)
Access Code: [email or web address]  URL: [email or web address]  Date: 06/03/2018  Prepared by: Venetia Night Tiquan Bouch   Exercises  Seated Shoulder Pendulum Exercise - 10 reps - 1 sets - 3x daily - 7x weekly  Shoulder External Rotation and Scapular Retraction - 10 reps - 1 sets - 1x daily - 7x weekly  Supine Shoulder Flexion AAROM with Hands Clasped - 10 reps - 1 sets - 3x daily - 7x weekly  Isometric Shoulder Flexion at Wall - 5 reps - 2 sets - 10 hold - 1x daily - 7x weekly  Isometric Shoulder Extension at Wall - 5 reps - 2 sets - 10 hold - 1x daily - 7x weekly  Isometric Shoulder External Rotation at Wall - 5 reps - 2 sets - 10 hold - 1x daily - 7x weekly  Standing Isometric Shoulder Internal Rotation at Doorway - 5 reps - 2 sets - 10 hold - 1x daily - 7x weekly
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
## 2018-06-08 ENCOUNTER — Ambulatory Visit: Payer: Medicaid Other | Admitting: Physical Therapy

## 2018-06-08 ENCOUNTER — Encounter: Payer: Self-pay | Admitting: Physical Therapy

## 2018-06-08 DIAGNOSIS — M25611 Stiffness of right shoulder, not elsewhere classified: Secondary | ICD-10-CM

## 2018-06-08 DIAGNOSIS — M25511 Pain in right shoulder: Secondary | ICD-10-CM | POA: Diagnosis not present

## 2018-06-08 DIAGNOSIS — M6281 Muscle weakness (generalized): Secondary | ICD-10-CM

## 2018-06-08 NOTE — Patient Instructions (Signed)
Access Code: [email or web address]  URL: [email or web address]  Date: 06/08/2018  Prepared by: Venetia Night Beuhring   Exercises  Seated Shoulder Pendulum Exercise - 10 reps - 1 sets - 3x daily - 7x weekly  Shoulder External Rotation and Scapular Retraction - 10 reps - 1 sets - 1x daily - 7x weekly  Supine Shoulder Flexion AAROM with Hands Clasped - 10 reps - 1 sets - 3x daily - 7x weekly  Isometric Shoulder Flexion at Wall - 5 reps - 2 sets - 10 hold - 1x daily - 7x weekly  Isometric Shoulder Extension at Wall - 5 reps - 2 sets - 10 hold - 1x daily - 7x weekly  Standing Isometric Shoulder Internal Rotation at Doorway - 5 reps - 2 sets - 10 hold - 1x daily - 7x weekly  Seated Shoulder Row with Anchored Resistance - 10 reps - 3 sets - 1x daily - 7x weekly  Shoulder Extension with Resistance - 10 reps - 3 sets - 1x daily - 7x weekly
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
## 2018-06-10 ENCOUNTER — Ambulatory Visit: Payer: Medicaid Other | Admitting: Physical Therapy

## 2018-06-15 ENCOUNTER — Ambulatory Visit: Payer: Medicaid Other | Admitting: Physical Therapy

## 2018-06-17 ENCOUNTER — Ambulatory Visit: Payer: Medicaid Other | Attending: Nurse Practitioner | Admitting: Physical Therapy

## 2018-06-17 ENCOUNTER — Encounter: Payer: Self-pay | Admitting: Physical Therapy

## 2018-06-17 ENCOUNTER — Ambulatory Visit: Payer: Medicaid Other | Admitting: Physical Therapy

## 2018-06-17 DIAGNOSIS — M25511 Pain in right shoulder: Secondary | ICD-10-CM | POA: Insufficient documentation

## 2018-06-17 DIAGNOSIS — M6281 Muscle weakness (generalized): Secondary | ICD-10-CM | POA: Diagnosis not present

## 2018-06-17 DIAGNOSIS — M25611 Stiffness of right shoulder, not elsewhere classified: Secondary | ICD-10-CM | POA: Diagnosis not present
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
## 2018-06-18 ENCOUNTER — Ambulatory Visit: Payer: Medicaid Other
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
## 2018-06-22 ENCOUNTER — Ambulatory Visit: Payer: Medicaid Other | Admitting: Physical Therapy

## 2018-06-22 ENCOUNTER — Encounter: Payer: Self-pay | Admitting: Physical Therapy

## 2018-06-22 DIAGNOSIS — M25611 Stiffness of right shoulder, not elsewhere classified: Secondary | ICD-10-CM | POA: Diagnosis not present

## 2018-06-22 DIAGNOSIS — M25511 Pain in right shoulder: Secondary | ICD-10-CM | POA: Diagnosis not present

## 2018-06-22 DIAGNOSIS — M6281 Muscle weakness (generalized): Secondary | ICD-10-CM
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
## 2018-06-30 ENCOUNTER — Encounter: Payer: Self-pay | Admitting: Physical Therapy

## 2018-06-30 ENCOUNTER — Ambulatory Visit: Payer: Medicaid Other | Admitting: Physical Therapy

## 2018-06-30 DIAGNOSIS — M25511 Pain in right shoulder: Secondary | ICD-10-CM

## 2018-06-30 DIAGNOSIS — M25611 Stiffness of right shoulder, not elsewhere classified: Secondary | ICD-10-CM

## 2018-06-30 DIAGNOSIS — M6281 Muscle weakness (generalized): Secondary | ICD-10-CM | POA: Diagnosis not present
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
## 2018-07-02 ENCOUNTER — Other Ambulatory Visit: Payer: Self-pay | Admitting: Nurse Practitioner

## 2018-07-02 ENCOUNTER — Encounter: Payer: Self-pay | Admitting: Internal Medicine

## 2018-07-02 DIAGNOSIS — I10 Essential (primary) hypertension: Secondary | ICD-10-CM

## 2018-07-13 ENCOUNTER — Ambulatory Visit: Payer: Medicaid Other | Attending: Nurse Practitioner | Admitting: Physical Therapy

## 2018-07-13 DIAGNOSIS — M6281 Muscle weakness (generalized): Secondary | ICD-10-CM | POA: Insufficient documentation

## 2018-07-13 DIAGNOSIS — M25511 Pain in right shoulder: Secondary | ICD-10-CM | POA: Diagnosis not present

## 2018-07-13 DIAGNOSIS — M25611 Stiffness of right shoulder, not elsewhere classified: Secondary | ICD-10-CM | POA: Diagnosis not present
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
## 2018-07-20 ENCOUNTER — Ambulatory Visit: Payer: Medicaid Other | Admitting: Physical Therapy

## 2018-07-23 MED FILL — LISINOPRIL 40 MG TABLET: 40 | 30 days supply | Qty: 30 | Fill #3

## 2018-07-23 MED FILL — CARVEDILOL 6.25 MG TABLET: 6.25 | 30 days supply | Qty: 60 | Fill #1

## 2018-07-27 ENCOUNTER — Encounter: Payer: Self-pay | Admitting: Internal Medicine

## 2018-07-27 ENCOUNTER — Ambulatory Visit: Payer: Medicaid Other | Admitting: Internal Medicine

## 2018-07-27 VITALS — BP 156/92 | HR 58 | Ht 66.0 in | Wt 170.6 lb

## 2018-07-27 DIAGNOSIS — I10 Essential (primary) hypertension: Secondary | ICD-10-CM

## 2018-07-27 NOTE — Patient Instructions (Signed)
Medication Instructions:  No changes  If you need a refill on your cardiac medications before your next appointment, please call your pharmacy.   Lab work: Today BMET If you have labs (blood work) drawn today and your tests are completely normal, you will receive your results only by: Marland Kitchen MyChart Message (if you have MyChart) OR . A paper copy in the mail If you have any lab test that is abnormal or we need to change your treatment, we will call you to review the results.  Testing/Procedures: none  Follow-Up: At [facility], you and your health needs are our priority.  As part of our continuing mission to provide you with exceptional heart care, we have created designated Provider Care Teams.  These Care Teams include your primary Cardiologist (physician) and Advanced Practice Providers (APPs -  Physician Assistants and Nurse Practitioners) who all work together to provide you with the care you need, when you need it. You will need a follow up appointment in:  9 months.  Please call our office 2 months in advance to schedule this appointment.  You may see Dr. [name].  or one of the following Advanced Practice Providers on your designated Care Team: Richardson Dopp, PA-C [place], [place] . Daune Perch, NP  Any Other Special Instructions Will Be Listed Below (If Applicable).
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
## 2018-07-28 LAB — BASIC METABOLIC PANEL
BUN/Creatinine Ratio: 15 (ref 10–24)
BUN: 27 mg/dL (ref 8–27)
CO2: 21 mmol/L (ref 20–29)
Calcium: 9.6 mg/dL (ref 8.6–10.2)
Chloride: 103 mmol/L (ref 96–106)
Creatinine, Ser: 1.81 mg/dL — ABNORMAL HIGH (ref 0.76–1.27)
GFR calc Af Amer: 46 mL/min/{1.73_m2} — ABNORMAL LOW (ref >59)
GFR calc non Af Amer: 39 mL/min/{1.73_m2} — ABNORMAL LOW (ref >59)
Glucose: 92 mg/dL (ref 65–99)
Potassium: 4.5 mmol/L (ref 3.5–5.2)
Sodium: 140 mmol/L (ref 134–144)

## 2018-07-31 ENCOUNTER — Encounter: Payer: Self-pay | Admitting: Physical Therapy

## 2018-07-31 ENCOUNTER — Ambulatory Visit: Payer: Medicaid Other | Admitting: Physical Therapy

## 2018-07-31 DIAGNOSIS — M25611 Stiffness of right shoulder, not elsewhere classified: Secondary | ICD-10-CM | POA: Diagnosis not present

## 2018-07-31 DIAGNOSIS — M6281 Muscle weakness (generalized): Secondary | ICD-10-CM

## 2018-07-31 DIAGNOSIS — M25511 Pain in right shoulder: Secondary | ICD-10-CM
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
## 2018-08-06 ENCOUNTER — Other Ambulatory Visit: Payer: Self-pay | Admitting: *Deleted

## 2018-08-06 DIAGNOSIS — I10 Essential (primary) hypertension: Secondary | ICD-10-CM

## 2018-08-06 MED ORDER — CHLORTHALIDONE 25 MG PO TABS: 12.5000 mg | ORAL_TABLET | Freq: Every day | ORAL | 3 refills | Status: DC

## 2018-08-06 NOTE — Progress Notes (Signed)
Notes recorded by [name], MD on 08/03/2018 at 10:47 PM EST Reviewed BP meds with pharmacy Would recomm trial of low dose chlorathalidone 12.5 mg   F/U BMET in 10 days   BP check in 4 to 6 wks   Spoke to patient and informed of recommendations from Dr. [name]. He verbalizes understanding/agreement with this plan.

## 2018-08-07 ENCOUNTER — Encounter: Payer: Self-pay | Admitting: Physical Therapy

## 2018-08-07 ENCOUNTER — Ambulatory Visit: Payer: Medicaid Other | Attending: Nurse Practitioner | Admitting: Physical Therapy

## 2018-08-07 DIAGNOSIS — M6281 Muscle weakness (generalized): Secondary | ICD-10-CM

## 2018-08-07 DIAGNOSIS — M25611 Stiffness of right shoulder, not elsewhere classified: Secondary | ICD-10-CM | POA: Diagnosis not present

## 2018-08-07 DIAGNOSIS — M25511 Pain in right shoulder: Secondary | ICD-10-CM | POA: Insufficient documentation
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
## 2018-08-11 ENCOUNTER — Encounter: Payer: Self-pay | Admitting: Nurse Practitioner

## 2018-08-11 ENCOUNTER — Ambulatory Visit: Payer: Medicaid Other | Attending: Nurse Practitioner | Admitting: Nurse Practitioner

## 2018-08-11 VITALS — BP 125/81 | HR 59 | Temp 98.3°F | Ht 66.0 in | Wt 171.8 lb

## 2018-08-11 DIAGNOSIS — R200 Anesthesia of skin: Secondary | ICD-10-CM | POA: Diagnosis not present

## 2018-08-11 DIAGNOSIS — R7303 Prediabetes: Secondary | ICD-10-CM | POA: Diagnosis not present

## 2018-08-11 DIAGNOSIS — Z8249 Family history of ischemic heart disease and other diseases of the circulatory system: Secondary | ICD-10-CM | POA: Diagnosis not present

## 2018-08-11 DIAGNOSIS — I10 Essential (primary) hypertension: Secondary | ICD-10-CM | POA: Diagnosis not present

## 2018-08-11 DIAGNOSIS — G629 Polyneuropathy, unspecified: Secondary | ICD-10-CM | POA: Diagnosis not present

## 2018-08-11 DIAGNOSIS — Z79899 Other long term (current) drug therapy: Secondary | ICD-10-CM | POA: Insufficient documentation

## 2018-08-11 DIAGNOSIS — Z7982 Long term (current) use of aspirin: Secondary | ICD-10-CM | POA: Diagnosis not present

## 2018-08-11 LAB — GLUCOSE, POCT (MANUAL RESULT ENTRY): POC Glucose: 120 mg/dl — AB (ref 70–99)

## 2018-08-11 MED ORDER — HYDRALAZINE HCL 100 MG PO TABS: 100.0000 mg | ORAL_TABLET | Freq: Three times a day (TID) | ORAL | 3 refills | Status: DC

## 2018-08-11 MED FILL — hydrALAZINE HCL 100 MG TABS: 100 | 30 days supply | Qty: 90 | Fill #0
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
## 2018-08-11 NOTE — Patient Instructions (Signed)
Make sure you have your labs performed next week that were ordered by cardiology

## 2018-08-12 ENCOUNTER — Ambulatory Visit: Payer: Self-pay | Admitting: Nurse Practitioner

## 2018-08-12 LAB — HEMOGLOBIN A1C
Est. average glucose Bld gHb Est-mCnc: 108 mg/dL
Hgb A1c MFr Bld: 5.4 % (ref 4.8–5.6)

## 2018-08-14 ENCOUNTER — Ambulatory Visit: Payer: Medicaid Other | Admitting: Physical Therapy

## 2018-08-14 ENCOUNTER — Encounter: Payer: Self-pay | Admitting: Physical Therapy

## 2018-08-14 DIAGNOSIS — M25511 Pain in right shoulder: Secondary | ICD-10-CM

## 2018-08-14 DIAGNOSIS — M6281 Muscle weakness (generalized): Secondary | ICD-10-CM | POA: Diagnosis not present

## 2018-08-14 DIAGNOSIS — M25611 Stiffness of right shoulder, not elsewhere classified: Secondary | ICD-10-CM

## 2018-08-14 NOTE — Patient Instructions (Signed)
Access Code: [email or web address]  URL: [email or web address]  Date: 08/14/2018  Prepared by: Sherol Dade   Exercises  Seated Shoulder Pendulum Exercise - 10 reps - 1 sets - 3x daily - 7x weekly  Shoulder External Rotation and Scapular Retraction - 10 reps - 1 sets - 1x daily - 7x weekly  Isometric Shoulder Flexion at Wall - 5 reps - 2 sets - 10 hold - 1x daily - 7x weekly  Isometric Shoulder Extension at Wall - 5 reps - 2 sets - 10 hold - 1x daily - 7x weekly  Standing Isometric Shoulder Internal Rotation at Doorway - 5 reps - 2 sets - 10 hold - 1x daily - 7x weekly  Seated Shoulder Row with Anchored Resistance - 10 reps - 3 sets - 1x daily - 7x weekly  Shoulder Extension with Resistance - 10 reps - 3 sets - 1x daily - 7x weekly  Shoulder Flexion Wall Slide with Towel - 10 reps - 3 sets - 1x daily - 7x weekly    [facility] [address], [place], [place] [address] Phone # [phone] Fax [phone]
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
## 2018-08-17 ENCOUNTER — Other Ambulatory Visit: Payer: Medicaid Other | Admitting: *Deleted

## 2018-08-17 ENCOUNTER — Encounter (INDEPENDENT_AMBULATORY_CARE_PROVIDER_SITE_OTHER): Payer: Self-pay | Admitting: Orthopedic Surgery

## 2018-08-17 DIAGNOSIS — I10 Essential (primary) hypertension: Secondary | ICD-10-CM | POA: Diagnosis not present

## 2018-08-17 LAB — BASIC METABOLIC PANEL
BUN/Creatinine Ratio: 19 (ref 10–24)
BUN: 33 mg/dL — ABNORMAL HIGH (ref 8–27)
CO2: 20 mmol/L (ref 20–29)
Calcium: 9.4 mg/dL (ref 8.6–10.2)
Chloride: 107 mmol/L — ABNORMAL HIGH (ref 96–106)
Creatinine, Ser: 1.72 mg/dL — ABNORMAL HIGH (ref 0.76–1.27)
GFR calc Af Amer: 49 mL/min/{1.73_m2} — ABNORMAL LOW (ref >59)
GFR calc non Af Amer: 42 mL/min/{1.73_m2} — ABNORMAL LOW (ref >59)
Glucose: 96 mg/dL (ref 65–99)
Potassium: 4.6 mmol/L (ref 3.5–5.2)
Sodium: 139 mmol/L (ref 134–144)

## 2018-08-18 ENCOUNTER — Ambulatory Visit: Payer: Medicaid Other | Attending: Nurse Practitioner | Admitting: Nurse Practitioner

## 2018-08-18 ENCOUNTER — Other Ambulatory Visit: Payer: Self-pay | Admitting: Pharmacist

## 2018-08-18 ENCOUNTER — Encounter: Payer: Self-pay | Admitting: Gastroenterology

## 2018-08-18 ENCOUNTER — Encounter: Payer: Self-pay | Admitting: Nurse Practitioner

## 2018-08-18 VITALS — BP 177/119 | HR 50 | Temp 98.5°F | Ht 66.0 in | Wt 170.4 lb

## 2018-08-18 DIAGNOSIS — N184 Chronic kidney disease, stage 4 (severe): Secondary | ICD-10-CM | POA: Diagnosis not present

## 2018-08-18 DIAGNOSIS — I10 Essential (primary) hypertension: Secondary | ICD-10-CM | POA: Diagnosis not present

## 2018-08-18 DIAGNOSIS — Z1211 Encounter for screening for malignant neoplasm of colon: Secondary | ICD-10-CM

## 2018-08-18 MED ORDER — AMLODIPINE BESYLATE 10 MG PO TABS: 10.0000 mg | ORAL_TABLET | Freq: Every day | ORAL | 0 refills | Status: DC

## 2018-08-18 MED ORDER — CHLORTHALIDONE 25 MG PO TABS: 12.5000 mg | ORAL_TABLET | Freq: Every day | ORAL | 0 refills | Status: DC

## 2018-08-18 MED ORDER — CHLORTHALIDONE 25 MG PO TABS: 12.5000 mg | ORAL_TABLET | Freq: Every day | ORAL | 2 refills | Status: DC

## 2018-08-18 MED ORDER — AMLODIPINE BESYLATE 10 MG PO TABS: 10.0000 mg | ORAL_TABLET | Freq: Every day | ORAL | 2 refills | Status: DC

## 2018-08-18 MED FILL — AMLODIPINE BESYLATE 10 MG T: 10 | 30 days supply | Qty: 30 | Fill #0

## 2018-08-18 MED FILL — CHLORTHALIDONE 25 MG TABLET: 25 | 30 days supply | Qty: 15 | Fill #0
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
## 2018-08-19 ENCOUNTER — Encounter: Payer: Self-pay | Admitting: Nurse Practitioner

## 2018-08-26 ENCOUNTER — Ambulatory Visit (AMBULATORY_SURGERY_CENTER): Payer: Self-pay | Admitting: *Deleted

## 2018-08-26 VITALS — Ht 66.0 in | Wt 169.0 lb

## 2018-08-26 DIAGNOSIS — Z1211 Encounter for screening for malignant neoplasm of colon: Secondary | ICD-10-CM

## 2018-08-26 MED ORDER — NA SULFATE-K SULFATE-MG SULF 17.5-3.13-1.6 GM/177ML PO SOLN: ORAL | 0 refills | Status: DC

## 2018-08-26 MED FILL — SUPREP BOWEL PREP KIT: 17.5-3.13-1 | 1 days supply | Qty: 354 | Fill #0

## 2018-08-26 NOTE — Progress Notes (Signed)
Patient denies any allergies to eggs or soy. Patient denies any problems with anesthesia/sedation. Patient denies any oxygen use at home. Patient denies taking any diet/weight loss medications or blood thinners. EMMI education offered, pt declined.  

## 2018-08-28 MED FILL — CARVEDILOL 6.25 MG TABLET: 6.25 | 30 days supply | Qty: 60 | Fill #2

## 2018-08-28 MED FILL — LISINOPRIL 40 MG TABLET: 40 | 30 days supply | Qty: 30 | Fill #4

## 2018-08-31 NOTE — Telephone Encounter (Signed)
I called patient in response to his my chart message. He stopped chlorthalidone 12.5 mg because after a several days of taking it, his right leg became numb.  The numbness last 2 days and has not returned.  He did not have any tingling or swelling in his face, throat or mouth.  I asked him to restart chlorthalidone 12.5 mg once a day and to stop taking and let us know if leg numbness returns.  Pt is in agreement with this plan.  Pt having colonoscopy on 09/09/18 and BP check w/ nurse on 3l/13/20.   Pt aware I am routing to Dr. [name] to inform and will call him back if she has other recommendations.

## 2018-09-09 ENCOUNTER — Ambulatory Visit (AMBULATORY_SURGERY_CENTER): Payer: Medicaid Other | Admitting: Gastroenterology

## 2018-09-09 ENCOUNTER — Other Ambulatory Visit: Payer: Self-pay

## 2018-09-09 ENCOUNTER — Encounter: Payer: Self-pay | Admitting: Gastroenterology

## 2018-09-09 VITALS — BP 116/75 | HR 49 | Temp 97.1°F | Resp 12 | Ht 66.0 in | Wt 170.0 lb

## 2018-09-09 DIAGNOSIS — I10 Essential (primary) hypertension: Secondary | ICD-10-CM | POA: Diagnosis not present

## 2018-09-09 DIAGNOSIS — Z1211 Encounter for screening for malignant neoplasm of colon: Secondary | ICD-10-CM | POA: Diagnosis not present

## 2018-09-09 MED ORDER — SODIUM CHLORIDE 0.9 % IV SOLN: 500.0000 mL | Freq: Once | INTRAVENOUS | Status: DC

## 2018-09-09 NOTE — Progress Notes (Signed)
Report to PACU, RN, vss, BBS= Clear.  

## 2018-09-09 NOTE — Patient Instructions (Signed)
YOU HAD AN ENDOSCOPIC PROCEDURE TODAY AT [facility]:   Refer to the procedure report that was given to you for any specific questions about what was found during the examination.  If the procedure report does not answer your questions, please call your gastroenterologist to clarify.  If you requested that your care partner not be given the details of your procedure findings, then the procedure report has been included in a sealed envelope for you to review at your convenience later.  YOU SHOULD EXPECT: Some feelings of bloating in the abdomen. Passage of more gas than usual.  Walking can help get rid of the air that was put into your GI tract during the procedure and reduce the bloating. If you had a lower endoscopy (such as a colonoscopy or flexible sigmoidoscopy) you may notice spotting of blood in your stool or on the toilet paper. If you underwent a bowel prep for your procedure, you may not have a normal bowel movement for a few days.  Please Note:  You might notice some irritation and congestion in your nose or some drainage.  This is from the oxygen used during your procedure.  There is no need for concern and it should clear up in a day or so.  SYMPTOMS TO REPORT IMMEDIATELY:   Following lower endoscopy (colonoscopy or flexible sigmoidoscopy):  Excessive amounts of blood in the stool  Significant tenderness or worsening of abdominal pains  Swelling of the abdomen that is new, acute  Fever of 100F or higher  For urgent or emergent issues, a gastroenterologist can be reached at any hour by calling [phone].   DIET:  We do recommend a small meal at first, but then you may proceed to your regular diet.  Drink plenty of fluids but you should avoid alcoholic beverages for 24 hours.  ACTIVITY:  You should plan to take it easy for the rest of today and you should NOT DRIVE or use heavy machinery until tomorrow (because of the sedation medicines used during the test).     FOLLOW UP: Our staff will call the number listed on your records the next business day following your procedure to check on you and address any questions or concerns that you may have regarding the information given to you following your procedure. If we do not reach you, we will leave a message.  However, if you are feeling well and you are not experiencing any problems, there is no need to return our call.  We will assume that you have returned to your regular daily activities without incident.  If any biopsies were taken you will be contacted by phone or by letter within the next 1-3 weeks.  Please call us at [phone] if you have not heard about the biopsies in 3 weeks.   Repeat next screening Colonoscopy in 10 years  SIGNATURES/CONFIDENTIALITY: You and/or your care partner have signed paperwork which will be entered into your electronic medical record.  These signatures attest to the fact that that the information above on your After Visit Summary has been reviewed and is understood.  Full responsibility of the confidentiality of this discharge information lies with you and/or your care-partner.

## 2018-09-09 NOTE — Progress Notes (Signed)
No problems noted in the recovery room. maw 

## 2018-09-09 NOTE — Op Note (Signed)
[place] Patient Name: Corey Guerrero Procedure Date: 09/09/2018 11:26 AM MRN: [id number] Endoscopist: Remo Lipps P. Havery Moros , MD Age: 63 Referring MD:  Date of Birth: August 25, 1955 Gender: Male Account #: [id number] Procedure:                Colonoscopy Indications:              Screening for colorectal malignant neoplasm, This                            is the patient's first colonoscopy Medicines:                Monitored Anesthesia Care Procedure:                Pre-Anesthesia Assessment:                           - Prior to the procedure, a History and Physical                            was performed, and patient medications and                            allergies were reviewed. The patient's tolerance of                            previous anesthesia was also reviewed. The risks                            and benefits of the procedure and the sedation                            options and risks were discussed with the patient.                            All questions were answered, and informed consent                            was obtained. Prior Anticoagulants: The patient has                            taken no previous anticoagulant or antiplatelet                            agents. ASA Grade Assessment: II - A patient with                            mild systemic disease. After reviewing the risks                            and benefits, the patient was deemed in                            satisfactory condition to undergo the procedure.  After obtaining informed consent, the colonoscope                            was passed under direct vision. Throughout the                            procedure, the patient's blood pressure, pulse, and                            oxygen saturations were monitored continuously. The                            Model CF-HQ190L [id number]) scope was introduced                            through the anus and  advanced to the the cecum,                            identified by appendiceal orifice and ileocecal                            valve. The colonoscopy was performed without                            difficulty. The patient tolerated the procedure                            well. The quality of the bowel preparation was                            good. The ileocecal valve, appendiceal orifice, and                            rectum were photographed. Scope In: 11:32:27 AM Scope Out: 11:51:10 AM Scope Withdrawal Time: 0 hours 14 minutes 26 seconds  Total Procedure Duration: 0 hours 18 minutes 43 seconds  Findings:                 The perianal and digital rectal examinations were                            normal.                           Multiple medium-mouthed diverticula were found in                            the left colon with hypertrophied haustra.                           Internal hemorrhoids were found during retroflexion.                           The exam was otherwise without abnormality. Complications:            No immediate complications. Estimated  blood loss:                            None. Estimated Blood Loss:     Estimated blood loss: none. Impression:               - Diverticulosis in the left colon.                           - Internal hemorrhoids.                           - The examination was otherwise normal.                           - No specimens collected. Recommendation:           - Patient has a contact number available for                            emergencies. The signs and symptoms of potential                            delayed complications were discussed with the                            patient. Return to normal activities tomorrow.                            Written discharge instructions were provided to the                            patient.                           - Resume previous diet.                           - Continue present  medications.                           - Repeat colonoscopy in 10 years for screening                            purposes. Remo Lipps P. Abrian Hanover, MD 09/09/2018 11:53:51 AM This report has been signed electronically.

## 2018-09-09 NOTE — Progress Notes (Signed)
Pt's states no medical or surgical changes since previsit or office visit. 

## 2018-09-10 ENCOUNTER — Telehealth: Payer: Self-pay

## 2018-09-10 NOTE — Telephone Encounter (Signed)
  Follow up Call-  Call back number 09/09/2018  Post procedure Call Back phone  # [phone]  Permission to leave phone message Yes  Some recent data might be hidden     Patient questions:  Do you have a fever, pain , or abdominal swelling? No. Pain Score  0 *  Have you tolerated food without any problems? Yes.    Have you been able to return to your normal activities? Yes.    Do you have any questions about your discharge instructions: Diet   No. Medications  No. Follow up visit  No.  Do you have questions or concerns about your Care? No.  Actions: * If pain score is 4 or above: No action needed, pain <4.

## 2018-09-11 ENCOUNTER — Ambulatory Visit: Payer: Medicaid Other

## 2018-09-14 ENCOUNTER — Telehealth: Payer: Self-pay

## 2018-09-14 NOTE — Telephone Encounter (Signed)
Left message for patient to call back. Need to cancel BP check on 09/18/18.

## 2018-09-18 ENCOUNTER — Ambulatory Visit: Payer: Medicaid Other

## 2018-09-23 ENCOUNTER — Telehealth (INDEPENDENT_AMBULATORY_CARE_PROVIDER_SITE_OTHER): Payer: Self-pay | Admitting: Radiology

## 2018-09-23 NOTE — Telephone Encounter (Signed)
Left message, please ask if patient calls back. Thank you!   Do you have now or have you had in the past 7 days a fever and/or chills? Do you have now or have you had in the past 7 days a cough? Do you have now or have you had in the last 7 days nausea, vomiting or abdominal pain? Have you been exposed to anyone who has tested positive for COVID-19? Have you or anyone who lives with you traveled within the last month?

## 2018-09-24 ENCOUNTER — Ambulatory Visit (INDEPENDENT_AMBULATORY_CARE_PROVIDER_SITE_OTHER): Payer: Self-pay | Admitting: Orthopedic Surgery

## 2018-09-24 NOTE — Telephone Encounter (Signed)
Unable to reach patient or leave message to ask screening questions.

## 2018-09-25 ENCOUNTER — Encounter (INDEPENDENT_AMBULATORY_CARE_PROVIDER_SITE_OTHER): Payer: Self-pay | Admitting: Orthopedic Surgery

## 2018-09-25 ENCOUNTER — Other Ambulatory Visit: Payer: Self-pay

## 2018-09-25 ENCOUNTER — Ambulatory Visit (INDEPENDENT_AMBULATORY_CARE_PROVIDER_SITE_OTHER): Payer: Medicaid Other | Admitting: Orthopedic Surgery

## 2018-09-25 DIAGNOSIS — M7551 Bursitis of right shoulder: Secondary | ICD-10-CM | POA: Diagnosis not present

## 2018-09-25 MED ORDER — METHYLPREDNISOLONE ACETATE 40 MG/ML IJ SUSP
40.0000 mg | INTRAMUSCULAR | Status: AC | PRN
  Administered 2018-09-25: 40 mg via INTRA_ARTICULAR

## 2018-09-25 MED ORDER — LIDOCAINE HCL 1 % IJ SOLN
5.0000 mL | INTRAMUSCULAR | Status: AC | PRN
  Administered 2018-09-25: 5 mL

## 2018-09-25 MED ORDER — BUPIVACAINE HCL 0.5 % IJ SOLN
9.0000 mL | INTRAMUSCULAR | Status: AC | PRN
  Administered 2018-09-25: 9 mL via INTRA_ARTICULAR

## 2018-09-25 MED FILL — AMLODIPINE BESYLATE 10 MG T: 10 | 30 days supply | Qty: 30 | Fill #1

## 2018-09-25 MED FILL — LISINOPRIL 40 MG TABLET: 40 | 30 days supply | Qty: 30 | Fill #5

## 2018-09-25 MED FILL — CHLORTHALIDONE 25 MG TAB: 25 | 30 days supply | Qty: 15 | Fill #1
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
## 2018-10-21 MED FILL — hydrALAZINE HCL 100 MG TABS: 100 | 30 days supply | Qty: 90 | Fill #1

## 2018-10-21 MED FILL — CARVEDILOL 6.25 MG TABLET: 6.25 | 30 days supply | Qty: 60 | Fill #3

## 2018-10-26 ENCOUNTER — Other Ambulatory Visit: Payer: Self-pay | Admitting: Nurse Practitioner

## 2018-10-26 DIAGNOSIS — I10 Essential (primary) hypertension: Secondary | ICD-10-CM

## 2018-10-26 MED FILL — AMLODIPINE BESYLATE 10 MG T: 10 | 30 days supply | Qty: 30 | Fill #2

## 2018-10-26 MED FILL — CHLORTHALIDONE 25 MG TAB: 25 | 30 days supply | Qty: 15 | Fill #2

## 2018-10-27 ENCOUNTER — Encounter: Payer: Self-pay | Admitting: Nurse Practitioner

## 2018-10-28 ENCOUNTER — Encounter: Payer: Self-pay | Admitting: Nurse Practitioner

## 2018-10-28 ENCOUNTER — Ambulatory Visit: Payer: Medicaid Other | Attending: Nurse Practitioner | Admitting: Nurse Practitioner

## 2018-10-28 ENCOUNTER — Other Ambulatory Visit: Payer: Self-pay

## 2018-10-28 DIAGNOSIS — R21 Rash and other nonspecific skin eruption: Secondary | ICD-10-CM | POA: Insufficient documentation

## 2018-10-28 MED ORDER — TRIAMCINOLONE ACETONIDE 0.1 % EX CREA: 1.0000 "application " | TOPICAL_CREAM | Freq: Two times a day (BID) | CUTANEOUS | 0 refills | Status: DC

## 2018-10-28 MED ORDER — HYDROXYZINE HCL 25 MG PO TABS: 25.0000 mg | ORAL_TABLET | Freq: Three times a day (TID) | ORAL | 0 refills | Status: AC | PRN
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
## 2018-11-11 ENCOUNTER — Encounter: Payer: Self-pay | Admitting: Nurse Practitioner

## 2018-11-11 ENCOUNTER — Other Ambulatory Visit: Payer: Self-pay | Admitting: Nurse Practitioner

## 2018-11-12 ENCOUNTER — Encounter: Payer: Self-pay | Admitting: Nurse Practitioner

## 2018-11-13 ENCOUNTER — Other Ambulatory Visit: Payer: Self-pay | Admitting: Nurse Practitioner

## 2018-11-16 ENCOUNTER — Ambulatory Visit: Payer: Medicaid Other | Admitting: Nurse Practitioner

## 2018-11-16 ENCOUNTER — Other Ambulatory Visit: Payer: Self-pay

## 2018-11-25 ENCOUNTER — Encounter: Payer: Self-pay | Admitting: Orthopedic Surgery

## 2018-11-25 ENCOUNTER — Ambulatory Visit: Payer: Medicaid Other | Attending: Nurse Practitioner | Admitting: Nurse Practitioner

## 2018-11-25 ENCOUNTER — Encounter: Payer: Self-pay | Admitting: Nurse Practitioner

## 2018-11-25 ENCOUNTER — Other Ambulatory Visit: Payer: Self-pay

## 2018-11-25 DIAGNOSIS — F1911 Other psychoactive substance abuse, in remission: Secondary | ICD-10-CM | POA: Insufficient documentation

## 2018-11-25 DIAGNOSIS — Z8249 Family history of ischemic heart disease and other diseases of the circulatory system: Secondary | ICD-10-CM | POA: Insufficient documentation

## 2018-11-25 DIAGNOSIS — Z7982 Long term (current) use of aspirin: Secondary | ICD-10-CM | POA: Diagnosis not present

## 2018-11-25 DIAGNOSIS — I10 Essential (primary) hypertension: Secondary | ICD-10-CM | POA: Diagnosis not present

## 2018-11-25 DIAGNOSIS — Z91030 Bee allergy status: Secondary | ICD-10-CM | POA: Diagnosis not present

## 2018-11-25 DIAGNOSIS — R21 Rash and other nonspecific skin eruption: Secondary | ICD-10-CM | POA: Diagnosis not present

## 2018-11-25 DIAGNOSIS — Z79899 Other long term (current) drug therapy: Secondary | ICD-10-CM | POA: Diagnosis not present

## 2018-11-25 DIAGNOSIS — Z888 Allergy status to other drugs, medicaments and biological substances status: Secondary | ICD-10-CM | POA: Diagnosis not present

## 2018-11-25 NOTE — Patient Instructions (Signed)
Ganglion Cyst    A ganglion cyst is a non-cancerous, fluid-filled lump that occurs near a joint or tendon. The cyst grows out of a joint or the lining of a tendon. Ganglion cysts most often develop in the hand or wrist, but they can also develop in the shoulder, elbow, hip, knee, ankle, or foot.  Ganglion cysts are ball-shaped or egg-shaped. Their size can range from the size of a pea to larger than a grape. Increased activity may cause the cyst to get bigger because more fluid starts to build up.  What are the causes?  The exact cause of this condition is not known, but it may be related to:   Inflammation or irritation around the joint.   An injury.   Repetitive movements or overuse.   Arthritis.  What increases the risk?  You are more likely to develop this condition if:   You are a woman.   You are 15-40 years old.  What are the signs or symptoms?  The main symptom of this condition is a lump. It most often appears on the hand or wrist. In many cases, there are no other symptoms, but a cyst can sometimes cause:   Tingling.   Pain.   Numbness.   Muscle weakness.   Weak grip.   Less range of motion in a joint.  How is this diagnosed?  Ganglion cysts are usually diagnosed based on a physical exam. Your health care provider will feel the lump and may shine a light next to it. If it is a ganglion cyst, the light will likely shine through it.  Your health care provider may order an X-ray, ultrasound, or MRI to rule out other conditions.  How is this treated?  Ganglion cysts often go away on their own without treatment. If you have pain or other symptoms, treatment may be needed. Treatment is also needed if the ganglion cyst limits your movement or if it gets infected. Treatment may include:   Wearing a brace or splint on your wrist or finger.   Taking anti-inflammatory medicine.   Having fluid drained from the lump with a needle (aspiration).   Getting a steroid injected into the joint.   Having  surgery to remove the ganglion cyst.   Placing a pad on your shoe or wearing shoes that will not rub against the cyst if it is on your foot.  Follow these instructions at home:   Do not press on the ganglion cyst, poke it with a needle, or hit it.   Take over-the-counter and prescription medicines only as told by your health care provider.   If you have a brace or splint:  ? Wear it as told by your health care provider.  ? Remove it as told by your health care provider. Ask if you need to remove it when you take a shower or a bath.   Watch your ganglion cyst for any changes.   Keep all follow-up visits as told by your health care provider. This is important.  Contact a health care provider if:   Your ganglion cyst becomes larger or more painful.   You have pus coming from the lump.   You have weakness or numbness in the affected area.   You have a fever or chills.  Get help right away if:   You have a fever and have any of these in the cyst area:  ? Increased redness.  ? Red streaks.  ? Swelling.  Summary     A ganglion cyst is a non-cancerous, fluid-filled lump that occurs near a joint or tendon.   Ganglion cysts most often develop in the hand or wrist, but they can also develop in the shoulder, elbow, hip, knee, ankle, or foot.   Ganglion cysts often go away on their own without treatment.  This information is not intended to replace advice given to you by your health care provider. Make sure you discuss any questions you have with your health care provider.  Document Released: 06/14/2000 Document Revised: 02/14/2017 Document Reviewed: 02/14/2017  Elsevier Interactive Patient Education  2019 Elsevier Inc[employer]
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
## 2018-11-26 LAB — BASIC METABOLIC PANEL
BUN/Creatinine Ratio: 11 (ref 10–24)
BUN: 21 mg/dL (ref 8–27)
CO2: 25 mmol/L (ref 20–29)
Calcium: 9.5 mg/dL (ref 8.6–10.2)
Chloride: 102 mmol/L (ref 96–106)
Creatinine, Ser: 1.99 mg/dL — ABNORMAL HIGH (ref 0.76–1.27)
GFR calc Af Amer: 41 mL/min/{1.73_m2} — ABNORMAL LOW (ref >59)
GFR calc non Af Amer: 35 mL/min/{1.73_m2} — ABNORMAL LOW (ref >59)
Glucose: 99 mg/dL (ref 65–99)
Potassium: 3.6 mmol/L (ref 3.5–5.2)
Sodium: 142 mmol/L (ref 134–144)

## 2018-11-27 ENCOUNTER — Encounter: Payer: Self-pay | Admitting: Nurse Practitioner

## 2018-11-27 MED ORDER — AMLODIPINE BESYLATE 10 MG PO TABS: 10.0000 mg | ORAL_TABLET | Freq: Every day | ORAL | 2 refills | Status: DC

## 2018-11-27 MED ORDER — HYDRALAZINE HCL 100 MG PO TABS: 100.0000 mg | ORAL_TABLET | Freq: Three times a day (TID) | ORAL | 3 refills | Status: DC

## 2018-11-27 MED ORDER — CARVEDILOL 6.25 MG PO TABS: 6.2500 mg | ORAL_TABLET | Freq: Two times a day (BID) | ORAL | 1 refills | Status: DC

## 2018-11-27 MED ORDER — TRIAMCINOLONE ACETONIDE 0.1 % EX CREA: 1.0000 "application " | TOPICAL_CREAM | Freq: Two times a day (BID) | CUTANEOUS | 0 refills | Status: DC

## 2018-11-27 MED ORDER — LISINOPRIL 40 MG PO TABS: 40.0000 mg | ORAL_TABLET | Freq: Every day | ORAL | 0 refills | Status: DC

## 2018-11-30 MED FILL — TRIAMCINOLONE ACETONIDE 0.1: 0.1 | 14 days supply | Qty: 30 | Fill #0

## 2018-11-30 MED FILL — AMLODIPINE BESYLATE 10 MG T: 10 | 90 days supply | Qty: 90 | Fill #0

## 2018-11-30 MED FILL — CARVEDILOL 6.25 MG TABLET: 6.25 | 90 days supply | Qty: 180 | Fill #0

## 2018-11-30 MED FILL — LISINOPRIL 40 MG TABLET: 40 | 90 days supply | Qty: 90 | Fill #0

## 2018-12-02 ENCOUNTER — Ambulatory Visit (INDEPENDENT_AMBULATORY_CARE_PROVIDER_SITE_OTHER): Payer: Medicaid Other

## 2018-12-02 ENCOUNTER — Ambulatory Visit: Payer: Medicaid Other | Admitting: Orthopedic Surgery

## 2018-12-02 ENCOUNTER — Other Ambulatory Visit: Payer: Self-pay

## 2018-12-02 ENCOUNTER — Encounter: Payer: Self-pay | Admitting: Orthopedic Surgery

## 2018-12-02 DIAGNOSIS — M7541 Impingement syndrome of right shoulder: Secondary | ICD-10-CM | POA: Diagnosis not present

## 2018-12-02 DIAGNOSIS — M25531 Pain in right wrist: Secondary | ICD-10-CM

## 2018-12-02 DIAGNOSIS — M67431 Ganglion, right wrist: Secondary | ICD-10-CM

## 2018-12-02 MED ORDER — METHYLPREDNISOLONE ACETATE 40 MG/ML IJ SUSP
13.3300 mg | INTRAMUSCULAR | Status: AC | PRN
  Administered 2018-12-02: 13.33 mg

## 2018-12-02 MED ORDER — METHYLPREDNISOLONE ACETATE 40 MG/ML IJ SUSP
13.3300 mg | INTRAMUSCULAR | Status: AC | PRN
  Administered 2018-12-02: 13.33 mg via INTRA_ARTICULAR

## 2018-12-02 MED ORDER — LIDOCAINE HCL 1 % IJ SOLN
3.0000 mL | INTRAMUSCULAR | Status: AC | PRN
  Administered 2018-12-02: 3 mL

## 2018-12-02 MED ORDER — BUPIVACAINE HCL 0.25 % IJ SOLN
0.3300 mL | INTRAMUSCULAR | Status: AC | PRN
  Administered 2018-12-02: .33 mL

## 2018-12-02 MED ORDER — BUPIVACAINE HCL 0.25 % IJ SOLN
0.6600 mL | INTRAMUSCULAR | Status: AC | PRN
  Administered 2018-12-02: .66 mL via INTRA_ARTICULAR
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
## 2018-12-24 NOTE — Telephone Encounter (Signed)
Opened in error

## 2018-12-30 MED FILL — hydrALAZINE HCL 100 MG TABS: 100 | 30 days supply | Qty: 90 | Fill #2

## 2019-01-13 ENCOUNTER — Other Ambulatory Visit: Payer: Self-pay | Admitting: Nurse Practitioner

## 2019-01-13 ENCOUNTER — Ambulatory Visit: Payer: Medicaid Other | Attending: Nurse Practitioner

## 2019-01-13 ENCOUNTER — Encounter: Payer: Self-pay | Admitting: Nurse Practitioner

## 2019-01-13 DIAGNOSIS — Z125 Encounter for screening for malignant neoplasm of prostate: Secondary | ICD-10-CM

## 2019-01-13 DIAGNOSIS — E785 Hyperlipidemia, unspecified: Secondary | ICD-10-CM

## 2019-01-13 DIAGNOSIS — N189 Chronic kidney disease, unspecified: Secondary | ICD-10-CM | POA: Diagnosis not present

## 2019-01-13 MED FILL — hydrALAZINE HCL 100 MG TABS: 100 | 30 days supply | Qty: 90 | Fill #2

## 2019-01-14 LAB — CMP14+EGFR
ALT: 23 IU/L (ref 0–44)
AST: 28 IU/L (ref 0–40)
Albumin/Globulin Ratio: 1.5 (ref 1.2–2.2)
Albumin: 4.3 g/dL (ref 3.8–4.8)
Alkaline Phosphatase: 49 IU/L (ref 39–117)
BUN/Creatinine Ratio: 15 (ref 10–24)
BUN: 28 mg/dL — ABNORMAL HIGH (ref 8–27)
Bilirubin Total: 0.5 mg/dL (ref 0.0–1.2)
CO2: 23 mmol/L (ref 20–29)
Calcium: 9.4 mg/dL (ref 8.6–10.2)
Chloride: 102 mmol/L (ref 96–106)
Creatinine, Ser: 1.88 mg/dL — ABNORMAL HIGH (ref 0.76–1.27)
GFR calc Af Amer: 44 mL/min/{1.73_m2} — ABNORMAL LOW (ref >59)
GFR calc non Af Amer: 38 mL/min/{1.73_m2} — ABNORMAL LOW (ref >59)
Globulin, Total: 2.8 g/dL (ref 1.5–4.5)
Glucose: 101 mg/dL — ABNORMAL HIGH (ref 65–99)
Potassium: 4 mmol/L (ref 3.5–5.2)
Sodium: 140 mmol/L (ref 134–144)
Total Protein: 7.1 g/dL (ref 6.0–8.5)

## 2019-01-14 LAB — CBC
Hematocrit: 45.5 % (ref 37.5–51.0)
Hemoglobin: 15.3 g/dL (ref 13.0–17.7)
MCH: 31 pg (ref 26.6–33.0)
MCHC: 33.6 g/dL (ref 31.5–35.7)
MCV: 92 fL (ref 79–97)
Platelets: 183 10*3/uL (ref 150–450)
RBC: 4.94 x10E6/uL (ref 4.14–5.80)
RDW: 12.1 % (ref 11.6–15.4)
WBC: 5 10*3/uL (ref 3.4–10.8)

## 2019-01-14 LAB — LIPID PANEL
Chol/HDL Ratio: 3.5 ratio (ref 0.0–5.0)
Cholesterol, Total: 156 mg/dL (ref 100–199)
HDL: 45 mg/dL (ref >39)
LDL Calculated: 102 mg/dL — ABNORMAL HIGH (ref 0–99)
Triglycerides: 47 mg/dL (ref 0–149)
VLDL Cholesterol Cal: 9 mg/dL (ref 5–40)

## 2019-01-14 LAB — PSA: Prostate Specific Ag, Serum: 1.4 ng/mL (ref 0.0–4.0)

## 2019-01-20 ENCOUNTER — Encounter: Payer: Self-pay | Admitting: Orthopedic Surgery

## 2019-01-21 ENCOUNTER — Encounter: Payer: Self-pay | Admitting: Orthopedic Surgery

## 2019-01-21 ENCOUNTER — Ambulatory Visit (INDEPENDENT_AMBULATORY_CARE_PROVIDER_SITE_OTHER): Payer: Medicaid Other | Admitting: Orthopedic Surgery

## 2019-01-21 ENCOUNTER — Ambulatory Visit (INDEPENDENT_AMBULATORY_CARE_PROVIDER_SITE_OTHER): Payer: Medicaid Other

## 2019-01-21 DIAGNOSIS — M79672 Pain in left foot: Secondary | ICD-10-CM

## 2019-01-21 MED ORDER — MELOXICAM 15 MG PO TABS: 15.0000 mg | ORAL_TABLET | Freq: Every day | ORAL | 0 refills | Status: DC

## 2019-01-21 MED FILL — MELOXICAM 15 MG TABLET: 15 | 21 days supply | Qty: 21 | Fill #0
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
## 2019-01-25 ENCOUNTER — Encounter: Payer: Self-pay | Admitting: Orthopedic Surgery

## 2019-02-01 ENCOUNTER — Ambulatory Visit (INDEPENDENT_AMBULATORY_CARE_PROVIDER_SITE_OTHER): Payer: Medicaid Other | Admitting: Orthopedic Surgery

## 2019-02-01 ENCOUNTER — Encounter: Payer: Self-pay | Admitting: Orthopedic Surgery

## 2019-02-01 ENCOUNTER — Other Ambulatory Visit: Payer: Self-pay

## 2019-02-01 DIAGNOSIS — M7541 Impingement syndrome of right shoulder: Secondary | ICD-10-CM | POA: Diagnosis not present

## 2019-02-01 MED ORDER — METHYLPREDNISOLONE ACETATE 40 MG/ML IJ SUSP
40.0000 mg | INTRAMUSCULAR | Status: AC | PRN
  Administered 2019-02-01: 40 mg via INTRA_ARTICULAR

## 2019-02-01 MED ORDER — LIDOCAINE HCL 1 % IJ SOLN
5.0000 mL | INTRAMUSCULAR | Status: AC | PRN
  Administered 2019-02-01: 5 mL

## 2019-02-01 MED ORDER — BUPIVACAINE HCL 0.5 % IJ SOLN
9.0000 mL | INTRAMUSCULAR | Status: AC | PRN
  Administered 2019-02-01: 9 mL via INTRA_ARTICULAR
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
## 2019-02-01 NOTE — Progress Notes (Signed)
   Procedure Note  Patient: Corey Guerrero             Date of Birth: 1956/03/17           MRN: [id number]             Visit Date: 02/01/2019  Procedures: Visit Diagnoses: No diagnosis found.  Large Joint Inj: R subacromial bursa on 02/01/2019 10:51 PM Indications: diagnostic evaluation and pain Details: 18 G 1.5 in needle, posterior approach  Arthrogram: No  Medications: 9 mL bupivacaine 0.5 %; 40 mg methylPREDNISolone acetate 40 MG/ML; 5 mL lidocaine 1 % Outcome: tolerated well, no immediate complications Procedure, treatment alternatives, risks and benefits explained, specific risks discussed. Consent was given by the patient. Immediately prior to procedure a time out was called to verify the correct patient, procedure, equipment, support staff and site/side marked as required. Patient was prepped and draped in the usual sterile fashion.

## 2019-02-26 ENCOUNTER — Other Ambulatory Visit: Payer: Self-pay

## 2019-02-26 ENCOUNTER — Encounter: Payer: Self-pay | Admitting: Nurse Practitioner

## 2019-02-26 ENCOUNTER — Ambulatory Visit: Payer: Medicaid Other | Attending: Nurse Practitioner | Admitting: Nurse Practitioner

## 2019-02-26 VITALS — BP 133/81 | HR 56 | Temp 98.6°F | Ht 66.0 in | Wt 172.2 lb

## 2019-02-26 DIAGNOSIS — I10 Essential (primary) hypertension: Secondary | ICD-10-CM | POA: Diagnosis not present

## 2019-02-26 DIAGNOSIS — R7303 Prediabetes: Secondary | ICD-10-CM | POA: Insufficient documentation

## 2019-02-26 DIAGNOSIS — N529 Male erectile dysfunction, unspecified: Secondary | ICD-10-CM | POA: Diagnosis not present

## 2019-02-26 DIAGNOSIS — Z7982 Long term (current) use of aspirin: Secondary | ICD-10-CM | POA: Diagnosis not present

## 2019-02-26 DIAGNOSIS — Z79899 Other long term (current) drug therapy: Secondary | ICD-10-CM | POA: Insufficient documentation

## 2019-02-26 DIAGNOSIS — Z8249 Family history of ischemic heart disease and other diseases of the circulatory system: Secondary | ICD-10-CM | POA: Diagnosis not present

## 2019-02-26 DIAGNOSIS — Z791 Long term (current) use of non-steroidal anti-inflammatories (NSAID): Secondary | ICD-10-CM | POA: Diagnosis not present

## 2019-02-26 DIAGNOSIS — Z23 Encounter for immunization: Secondary | ICD-10-CM | POA: Diagnosis not present

## 2019-02-26 LAB — POCT GLYCOSYLATED HEMOGLOBIN (HGB A1C): Hemoglobin A1C: 5.6 % (ref 4.0–5.6)

## 2019-02-26 LAB — GLUCOSE, POCT (MANUAL RESULT ENTRY): POC Glucose: 101 mg/dl — AB (ref 70–99)

## 2019-02-26 MED ORDER — AMLODIPINE BESYLATE 10 MG PO TABS: 10.0000 mg | ORAL_TABLET | Freq: Every day | ORAL | 2 refills | Status: DC

## 2019-02-26 MED ORDER — CARVEDILOL 6.25 MG PO TABS: 6.2500 mg | ORAL_TABLET | Freq: Two times a day (BID) | ORAL | 3 refills | Status: DC

## 2019-02-26 MED ORDER — HYDRALAZINE HCL 100 MG PO TABS: 100.0000 mg | ORAL_TABLET | Freq: Three times a day (TID) | ORAL | 3 refills | Status: DC

## 2019-02-26 MED ORDER — SILDENAFIL CITRATE 100 MG PO TABS: 50.0000 mg | ORAL_TABLET | Freq: Every day | ORAL | 11 refills | Status: DC | PRN

## 2019-02-26 MED FILL — CARVEDILOL 6.25 MG TABLET: 6.25 | 90 days supply | Qty: 180 | Fill #0

## 2019-02-26 MED FILL — AMLODIPINE BESYLATE 10 MG T: 10 | 90 days supply | Qty: 90 | Fill #0

## 2019-02-26 MED FILL — hydrALAZINE HCL 100 MG TABS: 100 | 90 days supply | Qty: 270 | Fill #0
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
## 2019-02-28 ENCOUNTER — Encounter: Payer: Self-pay | Admitting: Nurse Practitioner

## 2019-03-09 MED FILL — LISINOPRIL 40 MG TABLET: 40 | 90 days supply | Qty: 90 | Fill #0

## 2019-03-18 ENCOUNTER — Encounter: Payer: Self-pay | Admitting: Orthopedic Surgery

## 2019-03-18 ENCOUNTER — Ambulatory Visit (INDEPENDENT_AMBULATORY_CARE_PROVIDER_SITE_OTHER): Payer: Medicaid Other | Admitting: Orthopedic Surgery

## 2019-03-18 ENCOUNTER — Other Ambulatory Visit: Payer: Self-pay

## 2019-03-18 VITALS — Ht 66.0 in | Wt 172.0 lb

## 2019-03-18 DIAGNOSIS — M7541 Impingement syndrome of right shoulder: Secondary | ICD-10-CM | POA: Diagnosis not present

## 2019-03-18 DIAGNOSIS — M79672 Pain in left foot: Secondary | ICD-10-CM

## 2019-03-18 MED ORDER — TRAMADOL HCL 50 MG PO TABS: 50.0000 mg | ORAL_TABLET | Freq: Four times a day (QID) | ORAL | 0 refills | Status: DC | PRN

## 2019-03-23 ENCOUNTER — Encounter: Payer: Self-pay | Admitting: Orthopedic Surgery
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
## 2019-03-25 ENCOUNTER — Telehealth: Payer: Self-pay | Admitting: *Deleted

## 2019-03-25 NOTE — Telephone Encounter (Signed)

## 2019-03-26 ENCOUNTER — Telehealth: Payer: Self-pay

## 2019-03-26 NOTE — Telephone Encounter (Signed)
Spoke with pt and reviewed meds and consent was given. Informed pt to have vitals done prior to appt with Dr. [name]. Pt verbalized understanding and thanked me for the call.     Virtual Visit Pre-Appointment Phone Call  "(Name), I am calling you today to discuss your upcoming appointment. We are currently trying to limit exposure to the virus that causes COVID-19 by seeing patients at home rather than in the office."  1. "What is the BEST phone number to call the day of the visit?" - include this in appointment notes  2. "Do you have or have access to (through a family member/friend) a smartphone with video capability that we can use for your visit?" a. If yes - list this number in appt notes as "cell" (if different from BEST phone #) and list the appointment type as a VIDEO visit in appointment notes b. If no - list the appointment type as a PHONE visit in appointment notes  3. Confirm consent - "In the setting of the current Covid19 crisis, you are scheduled for a (phone or video) visit with your provider on (date) at (time).  Just as we do with many in-office visits, in order for you to participate in this visit, we must obtain consent.  If you'd like, I can send this to your mychart (if signed up) or email for you to review.  Otherwise, I can obtain your verbal consent now.  All virtual visits are billed to your insurance company just like a normal visit would be.  By agreeing to a virtual visit, we'd like you to understand that the technology does not allow for your provider to perform an examination, and thus may limit your provider's ability to fully assess your condition. If your provider identifies any concerns that need to be evaluated in person, we will make arrangements to do so.  Finally, though the technology is pretty good, we cannot assure that it will always work on either your or our end, and in the setting of a video visit, we may have to convert it to a phone-only visit.  In either  situation, we cannot ensure that we have a secure connection.  Are you willing to proceed?" STAFF: Did the patient verbally acknowledge consent to telehealth visit? Document YES/NO here: YES  4. Advise patient to be prepared - "Two hours prior to your appointment, go ahead and check your blood pressure, pulse, oxygen saturation, and your weight (if you have the equipment to check those) and write them all down. When your visit starts, your provider will ask you for this information. If you have an Apple Watch or Kardia device, please plan to have heart rate information ready on the day of your appointment. Please have a pen and paper handy nearby the day of the visit as well."  5. Give patient instructions for MyChart download to smartphone OR [email or web address] as below if video visit (depending on what platform provider is using)  6. Inform patient they will receive a phone call 15 minutes prior to their appointment time (may be from unknown caller ID) so they should be prepared to answer    TELEPHONE CALL NOTE  Corey Guerrero has been deemed a candidate for a follow-up tele-health visit to limit community exposure during the Covid-19 pandemic. I spoke with the patient via phone to ensure availability of phone/video source, confirm preferred email & phone number, and discuss instructions and expectations.  I reminded Corey Guerrero to be prepared with  any vital sign and/or heart rhythm information that could potentially be obtained via home monitoring, at the time of his visit. I reminded Corey Guerrero to expect a phone call prior to his visit.  Jacinta Shoe, [place] 03/26/2019 4:29 PM   INSTRUCTIONS FOR DOWNLOADING THE MYCHART APP TO SMARTPHONE  - The patient must first make sure to have activated MyChart and know their login information - If Apple, go to [employer] and type in MyChart in the search bar and download the app. If Android, ask patient to go to [employer] and type in  [place] in the search bar and download the app. The app is free but as with any other app downloads, their phone may require them to verify saved payment information or Apple/Android password.  - The patient will need to then log into the app with their MyChart username and password, and select [facility] as their healthcare provider to link the account. When it is time for your visit, go to the MyChart app, find appointments, and click Begin Video Visit. Be sure to Select Allow for your device to access the Microphone and Camera for your visit. You will then be connected, and your provider will be with you shortly.  **If they have any issues connecting, or need assistance please contact MyChart service desk [phone])**  **If using a computer, in order to ensure the best quality for their visit they will need to use either of the following Internet Browsers: [employer], or Google Chrome**  IF USING DOXIMITY or DOXY.ME - The patient will receive a link just prior to their visit by text.     FULL LENGTH CONSENT FOR TELE-HEALTH VISIT   I hereby voluntarily request, consent and authorize [facility] and its employed or contracted physicians, physician assistants, nurse practitioners or other licensed health care professionals (the Practitioner), to provide me with telemedicine health care services (the "Services") as deemed necessary by the treating Practitioner. I acknowledge and consent to receive the Services by the Practitioner via telemedicine. I understand that the telemedicine visit will involve communicating with the Practitioner through live audiovisual communication technology and the disclosure of certain medical information by electronic transmission. I acknowledge that I have been given the opportunity to request an in-person assessment or other available alternative prior to the telemedicine visit and am voluntarily participating in the telemedicine visit.  I  understand that I have the right to withhold or withdraw my consent to the use of telemedicine in the course of my care at any time, without affecting my right to future care or treatment, and that the Practitioner or I may terminate the telemedicine visit at any time. I understand that I have the right to inspect all information obtained and/or recorded in the course of the telemedicine visit and may receive copies of available information for a reasonable fee.  I understand that some of the potential risks of receiving the Services via telemedicine include:  Marland Kitchen Delay or interruption in medical evaluation due to technological equipment failure or disruption; . Information transmitted may not be sufficient (e.g. poor resolution of images) to allow for appropriate medical decision making by the Practitioner; and/or  . In rare instances, security protocols could fail, causing a breach of personal health information.  Furthermore, I acknowledge that it is my responsibility to provide information about my medical history, conditions and care that is complete and accurate to the best of my ability. I acknowledge that Practitioner's advice, recommendations,  and/or decision may be based on factors not within their control, such as incomplete or inaccurate data provided by me or distortions of diagnostic images or specimens that may result from electronic transmissions. I understand that the practice of medicine is not an exact science and that Practitioner makes no warranties or guarantees regarding treatment outcomes. I acknowledge that I will receive a copy of this consent concurrently upon execution via email to the email address I last provided but may also request a printed copy by calling the office of [facility].    I understand that my insurance will be billed for this visit.   I have read or had this consent read to me. . I understand the contents of this consent, which adequately explains the  benefits and risks of the Services being provided via telemedicine.  . I have been provided ample opportunity to ask questions regarding this consent and the Services and have had my questions answered to my satisfaction. . I give my informed consent for the services to be provided through the use of telemedicine in my medical care  By participating in this telemedicine visit I agree to the above.

## 2019-03-27 DIAGNOSIS — R51 Headache: Secondary | ICD-10-CM | POA: Diagnosis not present

## 2019-03-27 DIAGNOSIS — Z20828 Contact with and (suspected) exposure to other viral communicable diseases: Secondary | ICD-10-CM | POA: Diagnosis not present

## 2019-03-29 ENCOUNTER — Telehealth (INDEPENDENT_AMBULATORY_CARE_PROVIDER_SITE_OTHER): Payer: Medicaid Other | Admitting: Internal Medicine

## 2019-03-29 ENCOUNTER — Other Ambulatory Visit: Payer: Self-pay

## 2019-03-29 VITALS — BP 165/79 | Wt 175.0 lb

## 2019-03-29 DIAGNOSIS — E782 Mixed hyperlipidemia: Secondary | ICD-10-CM | POA: Diagnosis not present

## 2019-03-29 DIAGNOSIS — Z1159 Encounter for screening for other viral diseases: Secondary | ICD-10-CM | POA: Diagnosis not present

## 2019-03-29 DIAGNOSIS — I10 Essential (primary) hypertension: Secondary | ICD-10-CM

## 2019-03-29 DIAGNOSIS — Z9114 Patient's other noncompliance with medication regimen: Secondary | ICD-10-CM
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
## 2019-03-29 NOTE — Patient Instructions (Signed)
Medication Instructions:  No changes today If you need a refill on your cardiac medications before your next appointment, please call your pharmacy.   Lab work: none If you have labs (blood work) drawn today and your tests are completely normal, you will receive your results only by: Marland Kitchen MyChart Message (if you have MyChart) OR . A paper copy in the mail If you have any lab test that is abnormal or we need to change your treatment, we will call you to review the results.  Testing/Procedures: none   Follow-Up: At [facility], you and your health needs are our priority.  As part of our continuing mission to provide you with exceptional heart care, we have created designated Provider Care Teams.  These Care Teams include your primary Cardiologist (physician) and Advanced Practice Providers (APPs -  Physician Assistants and Nurse Practitioners) who all work together to provide you with the care you need, when you need it. You will need a follow up appointment in:  6 months.  Please call our office 2 months in advance to schedule this appointment.  You may see Dr. Dorris Carnes or one of the following Advanced Practice Providers on your designated Care Team: Richardson Dopp, PA-C [place], [place] . Daune Perch, NP  Any Other Special Instructions Will Be Listed Below (If Applicable). A prescription for pulse oximeter will be mailed to your home address on file

## 2019-03-30 ENCOUNTER — Ambulatory Visit: Payer: Medicaid Other | Attending: Family Medicine | Admitting: Family Medicine

## 2019-03-30 ENCOUNTER — Encounter: Payer: Self-pay | Admitting: Family Medicine

## 2019-03-30 DIAGNOSIS — I10 Essential (primary) hypertension: Secondary | ICD-10-CM

## 2019-03-30 DIAGNOSIS — K0889 Other specified disorders of teeth and supporting structures: Secondary | ICD-10-CM | POA: Diagnosis not present

## 2019-03-30 MED ORDER — AMOXICILLIN 500 MG PO CAPS: 500.0000 mg | ORAL_CAPSULE | Freq: Three times a day (TID) | ORAL | 0 refills | Status: DC

## 2019-03-30 NOTE — Progress Notes (Signed)
Patient has been called and DOB has been verified. Patient has been screened and transferred to PCP to start phone visit.     
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
## 2019-03-31 ENCOUNTER — Ambulatory Visit: Payer: Medicaid Other | Admitting: Orthopedic Surgery

## 2019-04-08 ENCOUNTER — Other Ambulatory Visit: Payer: Self-pay | Admitting: Orthopedic Surgery

## 2019-04-08 MED ORDER — TRAMADOL HCL 50 MG PO TABS: 50.0000 mg | ORAL_TABLET | Freq: Every day | ORAL | 0 refills | Status: DC | PRN

## 2019-04-08 NOTE — Telephone Encounter (Signed)
Dean patient 

## 2019-04-08 NOTE — Telephone Encounter (Signed)
Please advise. Thanks.  

## 2019-04-24 ENCOUNTER — Encounter: Payer: Self-pay | Admitting: Nurse Practitioner

## 2019-04-26 ENCOUNTER — Other Ambulatory Visit: Payer: Self-pay | Admitting: Nurse Practitioner

## 2019-04-26 MED ORDER — SILDENAFIL CITRATE 100 MG PO TABS: 50.0000 mg | ORAL_TABLET | Freq: Every day | ORAL | 11 refills | Status: DC | PRN

## 2019-06-29 ENCOUNTER — Encounter: Payer: Self-pay | Admitting: Nurse Practitioner

## 2019-06-29 ENCOUNTER — Other Ambulatory Visit: Payer: Self-pay | Admitting: Nurse Practitioner

## 2019-06-29 ENCOUNTER — Other Ambulatory Visit: Payer: Self-pay | Admitting: Family Medicine

## 2019-06-29 DIAGNOSIS — I10 Essential (primary) hypertension: Secondary | ICD-10-CM

## 2019-06-29 DIAGNOSIS — K0889 Other specified disorders of teeth and supporting structures: Secondary | ICD-10-CM

## 2019-06-29 MED ORDER — AMLODIPINE BESYLATE 10 MG PO TABS: 10.0000 mg | ORAL_TABLET | Freq: Every day | ORAL | 0 refills | Status: DC

## 2019-06-29 MED ORDER — CARVEDILOL 6.25 MG PO TABS: 6.2500 mg | ORAL_TABLET | Freq: Two times a day (BID) | ORAL | 0 refills | Status: DC

## 2019-06-29 MED ORDER — LISINOPRIL 40 MG PO TABS: 40.0000 mg | ORAL_TABLET | Freq: Every day | ORAL | 0 refills | Status: DC

## 2019-06-29 MED FILL — CARVEDILOL 6.25 MG TABLET: 6.25 | 30 days supply | Qty: 60 | Fill #0

## 2019-06-29 MED FILL — LISINOPRIL 40 MG TABLET: 40 | 30 days supply | Qty: 30 | Fill #0

## 2019-06-29 MED FILL — AMLODIPINE BESYLATE 10 MG T: 10 | 30 days supply | Qty: 30 | Fill #0

## 2019-07-01 NOTE — Telephone Encounter (Signed)
Spoke to patient. Pt was informed on his upcoming appt. With PCP.

## 2019-07-25 ENCOUNTER — Other Ambulatory Visit: Payer: Self-pay

## 2019-07-25 ENCOUNTER — Other Ambulatory Visit: Payer: Self-pay | Admitting: Nurse Practitioner

## 2019-07-25 DIAGNOSIS — I10 Essential (primary) hypertension: Secondary | ICD-10-CM

## 2019-07-25 MED ORDER — AMLODIPINE BESYLATE 10 MG PO TABS: 10.0000 mg | ORAL_TABLET | Freq: Every day | ORAL | 0 refills | Status: DC

## 2019-07-25 MED ORDER — HYDRALAZINE HCL 100 MG PO TABS: 100.0000 mg | ORAL_TABLET | Freq: Three times a day (TID) | ORAL | 3 refills | Status: DC

## 2019-07-25 MED ORDER — LISINOPRIL 40 MG PO TABS: 40.0000 mg | ORAL_TABLET | Freq: Every day | ORAL | 0 refills | Status: DC

## 2019-07-25 MED ORDER — CARVEDILOL 6.25 MG PO TABS: 6.2500 mg | ORAL_TABLET | Freq: Two times a day (BID) | ORAL | 0 refills | Status: DC

## 2019-07-26 MED FILL — hydrALAZINE HCL 100 MG TABS: 100 | 90 days supply | Qty: 270 | Fill #0

## 2019-07-26 MED FILL — CARVEDILOL 6.25 MG TABLET: 6.25 | 90 days supply | Qty: 180 | Fill #0

## 2019-07-26 MED FILL — AMLODIPINE BESYLATE 10 MG T: 10 | 90 days supply | Qty: 90 | Fill #0

## 2019-07-26 MED FILL — LISINOPRIL 40 MG TABLET: 40 | 90 days supply | Qty: 90 | Fill #0

## 2019-07-26 NOTE — Telephone Encounter (Signed)
a 

## 2019-07-27 ENCOUNTER — Other Ambulatory Visit: Payer: Self-pay

## 2019-07-27 ENCOUNTER — Ambulatory Visit: Payer: Medicaid Other | Attending: Nurse Practitioner | Admitting: Nurse Practitioner

## 2019-07-27 ENCOUNTER — Encounter: Payer: Self-pay | Admitting: Nurse Practitioner

## 2019-07-27 VITALS — BP 119/77 | HR 53 | Temp 98.4°F | Ht 66.0 in | Wt 180.0 lb

## 2019-07-27 DIAGNOSIS — Z7901 Long term (current) use of anticoagulants: Secondary | ICD-10-CM | POA: Diagnosis not present

## 2019-07-27 DIAGNOSIS — Z7982 Long term (current) use of aspirin: Secondary | ICD-10-CM | POA: Insufficient documentation

## 2019-07-27 DIAGNOSIS — I10 Essential (primary) hypertension: Secondary | ICD-10-CM | POA: Diagnosis not present

## 2019-07-27 DIAGNOSIS — R7303 Prediabetes: Secondary | ICD-10-CM | POA: Insufficient documentation

## 2019-07-27 DIAGNOSIS — N184 Chronic kidney disease, stage 4 (severe): Secondary | ICD-10-CM | POA: Diagnosis not present

## 2019-07-27 DIAGNOSIS — Z79899 Other long term (current) drug therapy: Secondary | ICD-10-CM | POA: Diagnosis not present
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
## 2019-07-28 ENCOUNTER — Encounter: Payer: Self-pay | Admitting: Nurse Practitioner

## 2019-07-28 LAB — HEMOGLOBIN A1C
Est. average glucose Bld gHb Est-mCnc: 114 mg/dL
Hgb A1c MFr Bld: 5.6 % (ref 4.8–5.6)

## 2019-07-28 LAB — CMP14+EGFR
ALT: 27 IU/L (ref 0–44)
AST: 30 IU/L (ref 0–40)
Albumin/Globulin Ratio: 1.3 (ref 1.2–2.2)
Albumin: 4.1 g/dL (ref 3.8–4.8)
Alkaline Phosphatase: 68 IU/L (ref 39–117)
BUN/Creatinine Ratio: 15 (ref 10–24)
BUN: 26 mg/dL (ref 8–27)
Bilirubin Total: 0.3 mg/dL (ref 0.0–1.2)
CO2: 25 mmol/L (ref 20–29)
Calcium: 9.4 mg/dL (ref 8.6–10.2)
Chloride: 100 mmol/L (ref 96–106)
Creatinine, Ser: 1.76 mg/dL — ABNORMAL HIGH (ref 0.76–1.27)
GFR calc Af Amer: 47 mL/min/{1.73_m2} — ABNORMAL LOW (ref >59)
GFR calc non Af Amer: 41 mL/min/{1.73_m2} — ABNORMAL LOW (ref >59)
Globulin, Total: 3.2 g/dL (ref 1.5–4.5)
Glucose: 95 mg/dL (ref 65–99)
Potassium: 4.4 mmol/L (ref 3.5–5.2)
Sodium: 137 mmol/L (ref 134–144)
Total Protein: 7.3 g/dL (ref 6.0–8.5)

## 2019-08-10 MED FILL — hydrALAZINE HCL 100 MG TABS: 100 | 90 days supply | Qty: 270 | Fill #0

## 2019-08-10 MED FILL — AMLODIPINE BESYLATE 10 MG T: 10 | 90 days supply | Qty: 90 | Fill #0

## 2019-08-10 MED FILL — CARVEDILOL 6.25 MG TABLET: 6.25 | 90 days supply | Qty: 180 | Fill #0

## 2019-08-10 MED FILL — LISINOPRIL 40 MG TABLET: 40 | 90 days supply | Qty: 90 | Fill #0

## 2019-08-31 IMAGING — CR DG CHEST 2V
2 series · 2 of 2 positions shown · non-contrast
Comparison: Chest x-ray of 03/21/2017

CLINICAL DATA: Shortness of breath and dizziness, recent
hospitalization for hypertension

EXAM:
CHEST - 2 VIEW

[w chest pa]
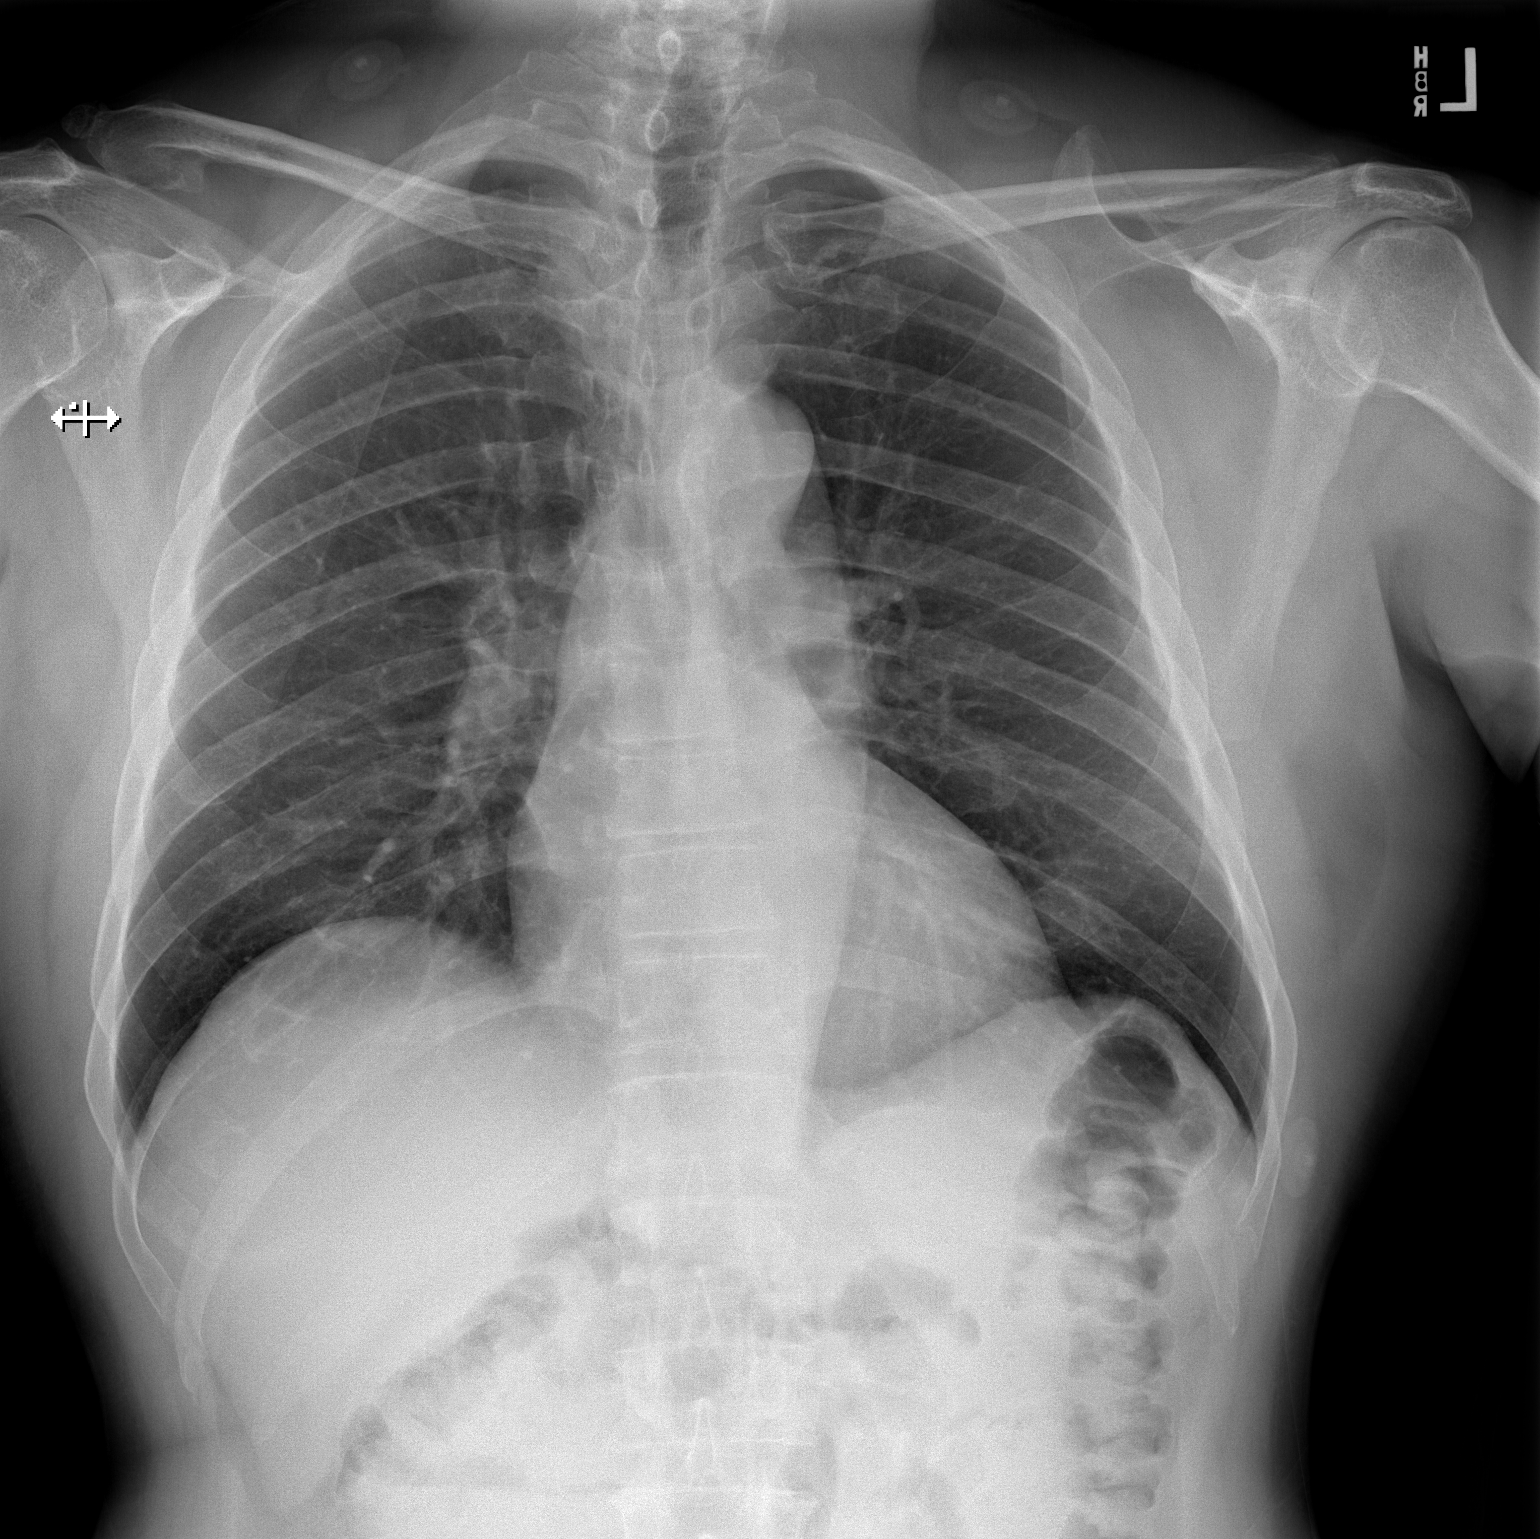

[w chest lat]
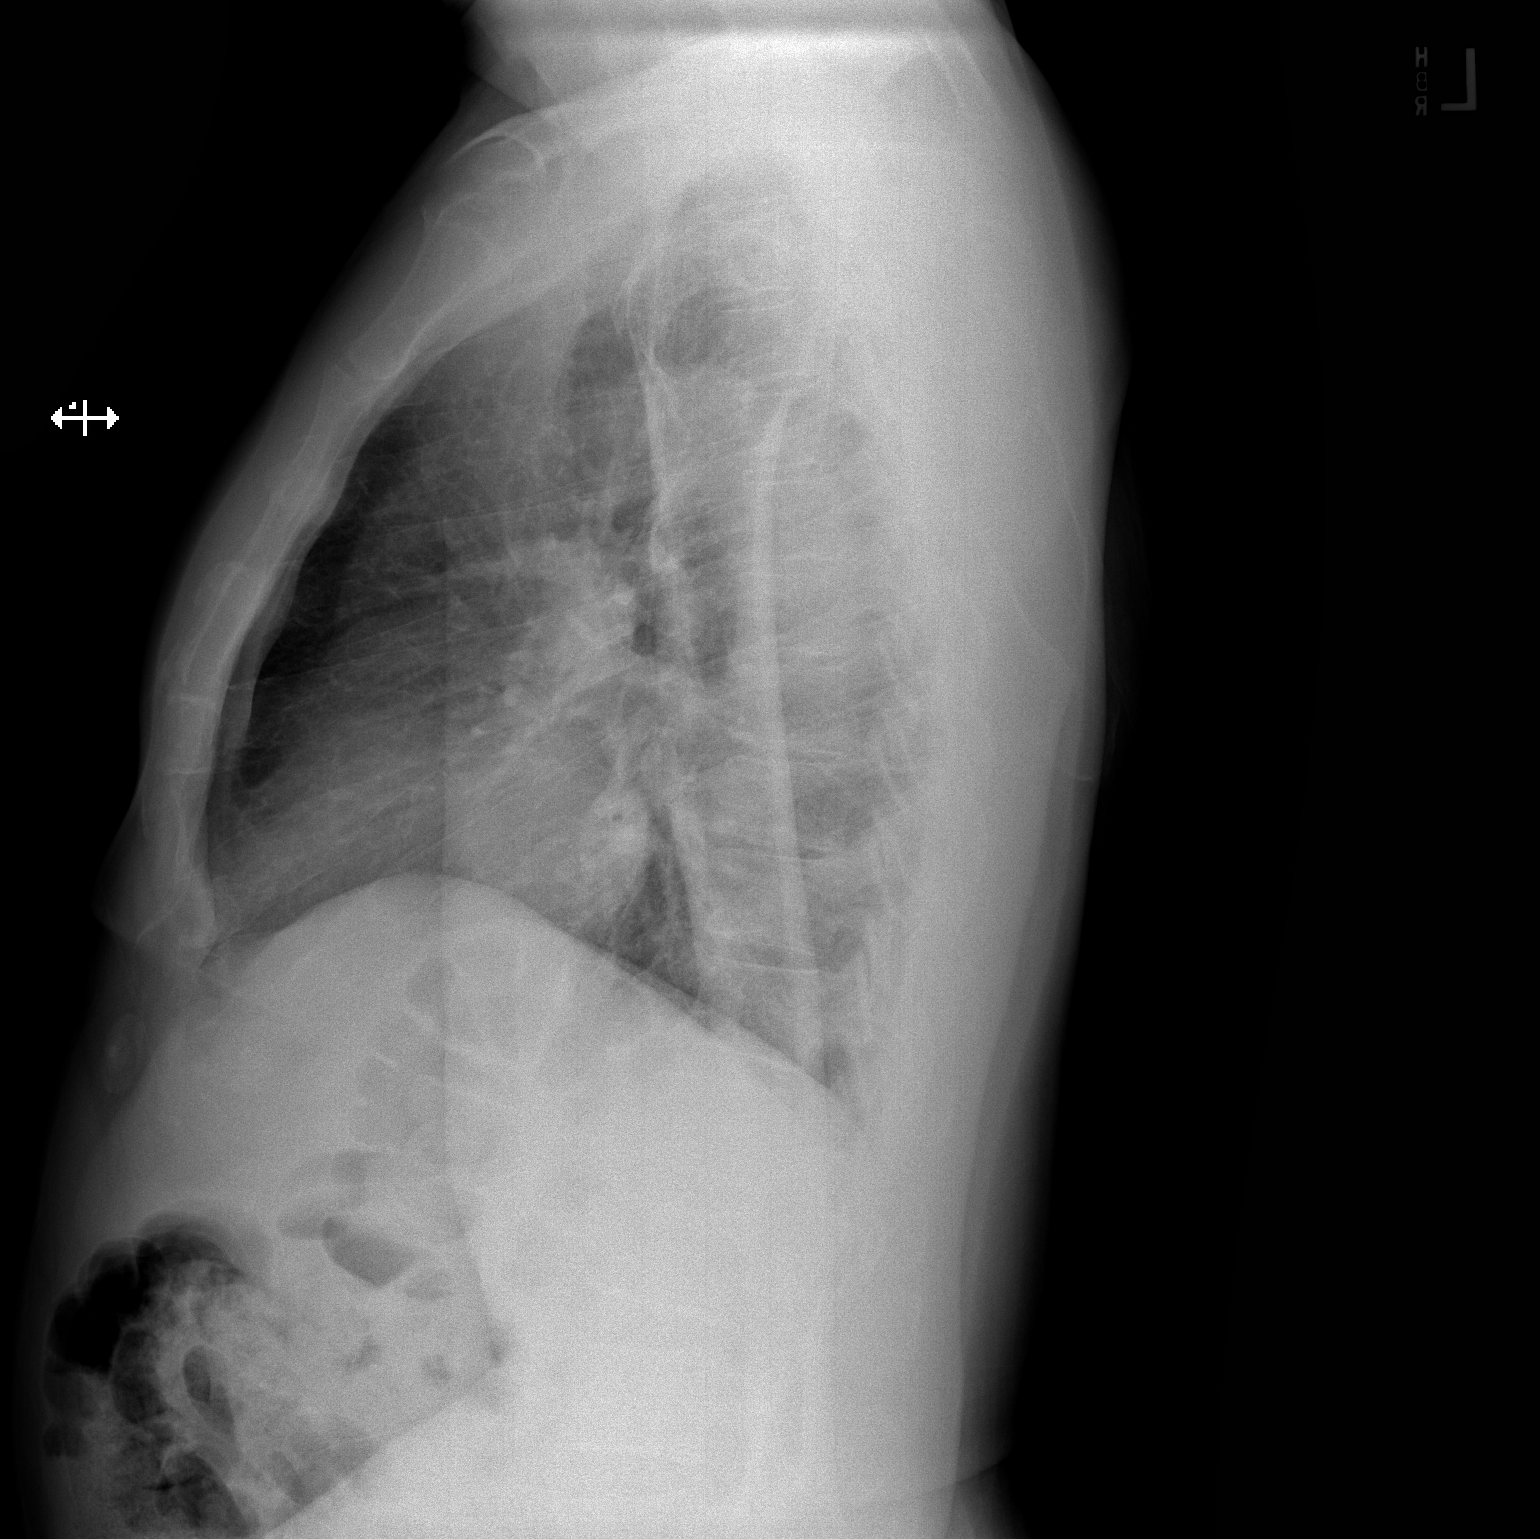

[2 of 2 positions shown; findings below may reference images not displayed]

FINDINGS: No active infiltrate or effusion is seen. Mediastinal and hilar
contours are unremarkable. The heart is within normal limits in
size. No acute bony abnormality is seen.
IMPRESSION: No active cardiopulmonary disease.

## 2019-09-01 DIAGNOSIS — H16223 Keratoconjunctivitis sicca, not specified as Sjogren's, bilateral: Secondary | ICD-10-CM | POA: Diagnosis not present

## 2019-09-01 DIAGNOSIS — H40033 Anatomical narrow angle, bilateral: Secondary | ICD-10-CM | POA: Diagnosis not present

## 2019-09-01 IMAGING — CR DG CHEST 2V
2 series · 2 of 2 positions shown · non-contrast
Comparison: 03/05/2018.

CLINICAL DATA: Short of breath and dizziness for 1 day. History of
hypertension.

EXAM:
CHEST - 2 VIEW

[chest ap]
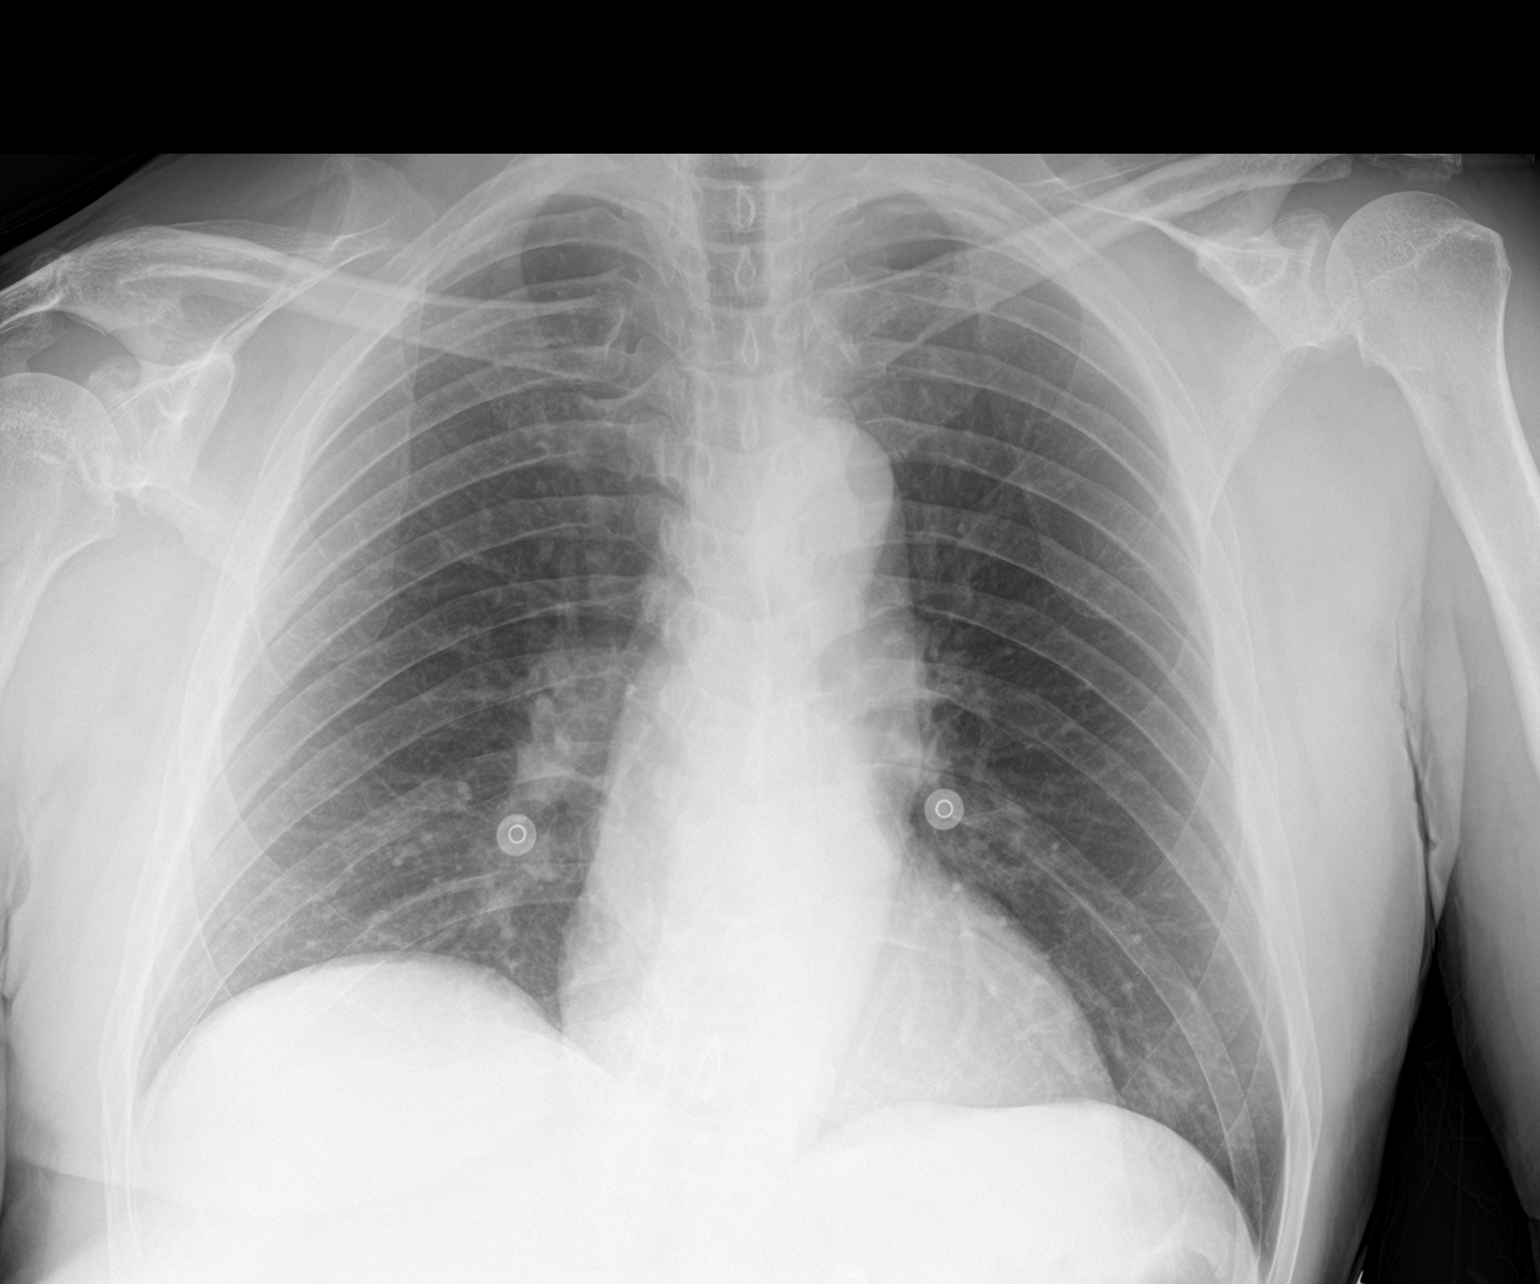

[chest lat]
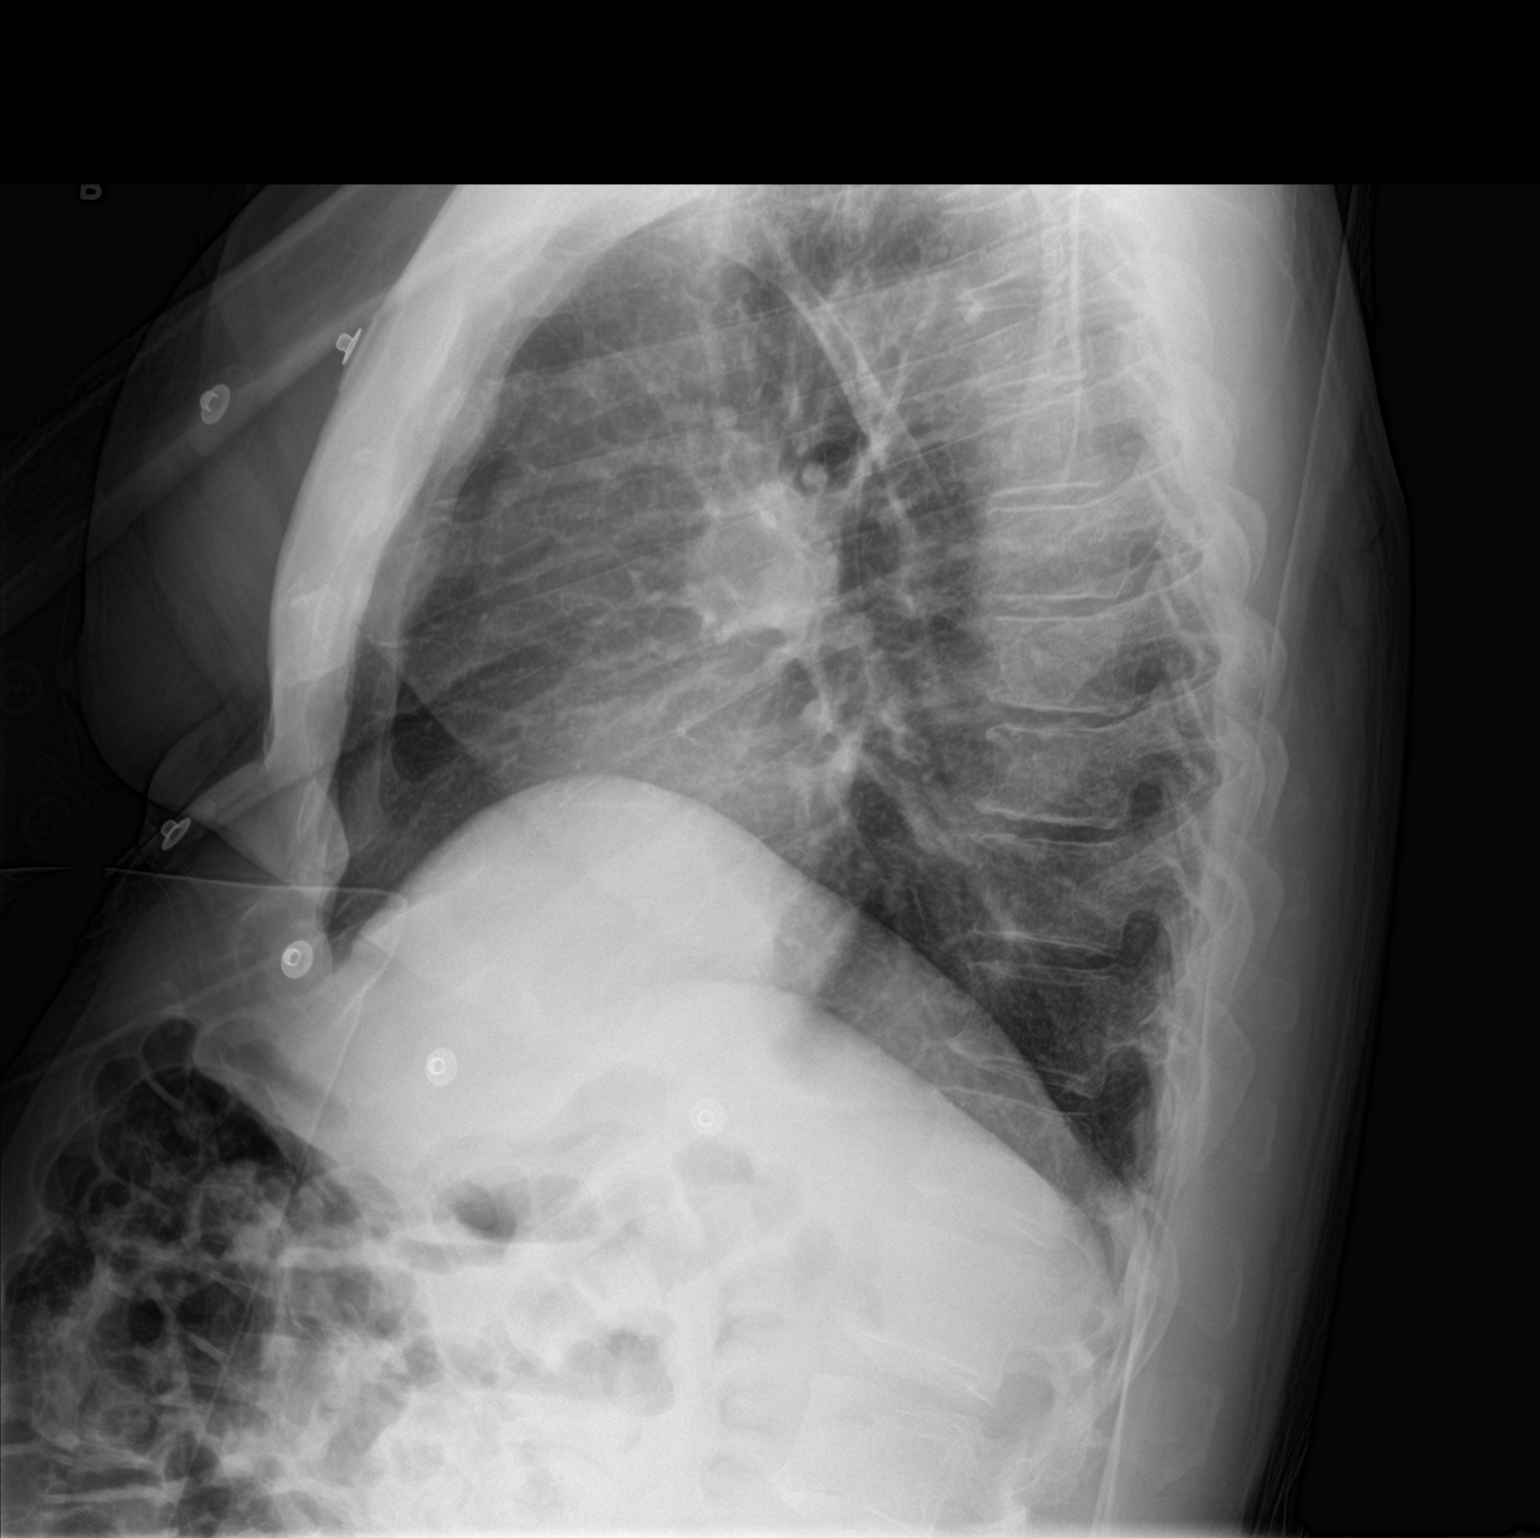

[2 of 2 positions shown; findings below may reference images not displayed]

FINDINGS: Cardiac silhouette is normal in size. No mediastinal or hilar
masses. No evidence of adenopathy.

Clear lungs.  No pleural effusion or pneumothorax.

Skeletal structures are intact.
IMPRESSION: No active cardiopulmonary disease.
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
## 2019-09-23 ENCOUNTER — Ambulatory Visit: Payer: Medicaid Other | Admitting: Internal Medicine

## 2019-09-25 DIAGNOSIS — H1013 Acute atopic conjunctivitis, bilateral: Secondary | ICD-10-CM | POA: Diagnosis not present

## 2019-09-26 DIAGNOSIS — H5213 Myopia, bilateral: Secondary | ICD-10-CM | POA: Diagnosis not present
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
## 2019-09-29 ENCOUNTER — Ambulatory Visit: Payer: Medicaid Other | Admitting: Physician Assistant

## 2019-10-22 DIAGNOSIS — H5203 Hypermetropia, bilateral: Secondary | ICD-10-CM | POA: Diagnosis not present

## 2019-10-22 DIAGNOSIS — H1013 Acute atopic conjunctivitis, bilateral: Secondary | ICD-10-CM | POA: Diagnosis not present

## 2019-10-25 ENCOUNTER — Ambulatory Visit: Payer: Medicaid Other | Admitting: Nurse Practitioner

## 2019-11-19 ENCOUNTER — Other Ambulatory Visit: Payer: Self-pay

## 2019-11-19 ENCOUNTER — Ambulatory Visit: Payer: Medicaid Other | Attending: Nurse Practitioner | Admitting: Nurse Practitioner

## 2019-11-19 ENCOUNTER — Encounter: Payer: Self-pay | Admitting: Nurse Practitioner

## 2019-11-19 VITALS — BP 154/102 | HR 57 | Temp 97.7°F | Ht 66.0 in | Wt 183.0 lb

## 2019-11-19 DIAGNOSIS — N521 Erectile dysfunction due to diseases classified elsewhere: Secondary | ICD-10-CM | POA: Diagnosis not present

## 2019-11-19 DIAGNOSIS — Z7982 Long term (current) use of aspirin: Secondary | ICD-10-CM | POA: Diagnosis not present

## 2019-11-19 DIAGNOSIS — N1831 Chronic kidney disease, stage 3a: Secondary | ICD-10-CM | POA: Diagnosis not present

## 2019-11-19 DIAGNOSIS — Z888 Allergy status to other drugs, medicaments and biological substances status: Secondary | ICD-10-CM | POA: Diagnosis not present

## 2019-11-19 DIAGNOSIS — I10 Essential (primary) hypertension: Secondary | ICD-10-CM

## 2019-11-19 DIAGNOSIS — Z0000 Encounter for general adult medical examination without abnormal findings: Secondary | ICD-10-CM

## 2019-11-19 DIAGNOSIS — N529 Male erectile dysfunction, unspecified: Secondary | ICD-10-CM | POA: Diagnosis not present

## 2019-11-19 DIAGNOSIS — Z634 Disappearance and death of family member: Secondary | ICD-10-CM | POA: Diagnosis not present

## 2019-11-19 DIAGNOSIS — B360 Pityriasis versicolor: Secondary | ICD-10-CM | POA: Insufficient documentation

## 2019-11-19 DIAGNOSIS — E785 Hyperlipidemia, unspecified: Secondary | ICD-10-CM | POA: Diagnosis not present

## 2019-11-19 DIAGNOSIS — Z79899 Other long term (current) drug therapy: Secondary | ICD-10-CM | POA: Diagnosis not present

## 2019-11-19 DIAGNOSIS — Z713 Dietary counseling and surveillance: Secondary | ICD-10-CM | POA: Diagnosis not present

## 2019-11-19 DIAGNOSIS — I129 Hypertensive chronic kidney disease with stage 1 through stage 4 chronic kidney disease, or unspecified chronic kidney disease: Secondary | ICD-10-CM | POA: Insufficient documentation

## 2019-11-19 DIAGNOSIS — Z8249 Family history of ischemic heart disease and other diseases of the circulatory system: Secondary | ICD-10-CM | POA: Insufficient documentation

## 2019-11-19 MED ORDER — CARVEDILOL 6.25 MG PO TABS: 6.2500 mg | ORAL_TABLET | Freq: Two times a day (BID) | ORAL | 0 refills | Status: DC

## 2019-11-19 MED ORDER — SILDENAFIL CITRATE 100 MG PO TABS: 50.0000 mg | ORAL_TABLET | Freq: Every day | ORAL | 11 refills | Status: DC | PRN

## 2019-11-19 MED ORDER — HYDRALAZINE HCL 100 MG PO TABS: 100.0000 mg | ORAL_TABLET | Freq: Three times a day (TID) | ORAL | 3 refills | Status: DC

## 2019-11-19 MED ORDER — SELENIUM SULFIDE 2.25 % EX SHAM: MEDICATED_SHAMPOO | CUTANEOUS | 1 refills | Status: DC

## 2019-11-19 MED ORDER — LISINOPRIL 40 MG PO TABS: 40.0000 mg | ORAL_TABLET | Freq: Every day | ORAL | 0 refills | Status: DC

## 2019-11-19 MED ORDER — AMLODIPINE BESYLATE 10 MG PO TABS: 10.0000 mg | ORAL_TABLET | Freq: Every day | ORAL | 0 refills | Status: DC

## 2019-11-19 MED FILL — SELENIUM SULFIDE 2.25 % SHA: 2.25 | 15 days supply | Qty: 180 | Fill #0

## 2019-11-19 MED FILL — LISINOPRIL 40 MG TABLET: 40 | 90 days supply | Qty: 90 | Fill #0

## 2019-11-19 MED FILL — CARVEDILOL 6.25 MG TABLET: 6.25 | 90 days supply | Qty: 180 | Fill #0

## 2019-11-19 MED FILL — hydrALAZINE HCL 100 MG TABS: 100 | 90 days supply | Qty: 270 | Fill #0

## 2019-11-19 MED FILL — AMLODIPINE BESYLATE 10 MG T: 10 | 90 days supply | Qty: 90 | Fill #0
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
## 2019-11-20 LAB — CBC
Hematocrit: 46.4 % (ref 37.5–51.0)
Hemoglobin: 15.8 g/dL (ref 13.0–17.7)
MCH: 31.4 pg (ref 26.6–33.0)
MCHC: 34.1 g/dL (ref 31.5–35.7)
MCV: 92 fL (ref 79–97)
Platelets: 160 10*3/uL (ref 150–450)
RBC: 5.03 x10E6/uL (ref 4.14–5.80)
RDW: 13.1 % (ref 11.6–15.4)
WBC: 7.1 10*3/uL (ref 3.4–10.8)

## 2019-11-20 LAB — CMP14+EGFR
ALT: 21 IU/L (ref 0–44)
AST: 29 IU/L (ref 0–40)
Albumin/Globulin Ratio: 1.5 (ref 1.2–2.2)
Albumin: 4.3 g/dL (ref 3.8–4.8)
Alkaline Phosphatase: 64 IU/L (ref 48–121)
BUN/Creatinine Ratio: 13 (ref 10–24)
BUN: 22 mg/dL (ref 8–27)
Bilirubin Total: 0.7 mg/dL (ref 0.0–1.2)
CO2: 23 mmol/L (ref 20–29)
Calcium: 9.2 mg/dL (ref 8.6–10.2)
Chloride: 105 mmol/L (ref 96–106)
Creatinine, Ser: 1.69 mg/dL — ABNORMAL HIGH (ref 0.76–1.27)
GFR calc Af Amer: 49 mL/min/1.73 — ABNORMAL LOW
GFR calc non Af Amer: 43 mL/min/1.73 — ABNORMAL LOW
Globulin, Total: 2.8 g/dL (ref 1.5–4.5)
Glucose: 88 mg/dL (ref 65–99)
Potassium: 3.8 mmol/L (ref 3.5–5.2)
Sodium: 143 mmol/L (ref 134–144)
Total Protein: 7.1 g/dL (ref 6.0–8.5)

## 2019-11-20 LAB — LIPID PANEL
Chol/HDL Ratio: 3.9 ratio (ref 0.0–5.0)
Cholesterol, Total: 177 mg/dL (ref 100–199)
HDL: 45 mg/dL
LDL Chol Calc (NIH): 106 mg/dL — ABNORMAL HIGH (ref 0–99)
Triglycerides: 150 mg/dL — ABNORMAL HIGH (ref 0–149)
VLDL Cholesterol Cal: 26 mg/dL (ref 5–40)

## 2019-11-21 ENCOUNTER — Other Ambulatory Visit: Payer: Self-pay | Admitting: Nurse Practitioner

## 2019-11-21 MED ORDER — ATORVASTATIN CALCIUM 10 MG PO TABS: 10.0000 mg | ORAL_TABLET | Freq: Every day | ORAL | 3 refills | Status: DC

## 2019-11-22 MED FILL — ATORVASTATIN 10 MG TABLET: 10 | 90 days supply | Qty: 90 | Fill #0

## 2020-02-18 ENCOUNTER — Encounter: Payer: Self-pay | Admitting: Nurse Practitioner

## 2020-02-18 ENCOUNTER — Ambulatory Visit: Payer: Medicaid Other | Attending: Nurse Practitioner | Admitting: Nurse Practitioner

## 2020-02-18 ENCOUNTER — Other Ambulatory Visit: Payer: Self-pay

## 2020-02-18 DIAGNOSIS — E785 Hyperlipidemia, unspecified: Secondary | ICD-10-CM | POA: Diagnosis not present

## 2020-02-18 DIAGNOSIS — I10 Essential (primary) hypertension: Secondary | ICD-10-CM | POA: Diagnosis not present

## 2020-02-18 MED ORDER — ATORVASTATIN CALCIUM 10 MG PO TABS: 10.0000 mg | ORAL_TABLET | Freq: Every day | ORAL | 3 refills | Status: DC

## 2020-02-18 MED ORDER — HYDRALAZINE HCL 100 MG PO TABS: 100.0000 mg | ORAL_TABLET | Freq: Three times a day (TID) | ORAL | 3 refills | Status: DC

## 2020-02-18 MED ORDER — CARVEDILOL 6.25 MG PO TABS: 6.2500 mg | ORAL_TABLET | Freq: Two times a day (BID) | ORAL | 0 refills | Status: DC

## 2020-02-18 MED ORDER — LISINOPRIL 40 MG PO TABS: 40.0000 mg | ORAL_TABLET | Freq: Every day | ORAL | 0 refills | Status: DC

## 2020-02-18 MED ORDER — AMLODIPINE BESYLATE 10 MG PO TABS: 10.0000 mg | ORAL_TABLET | Freq: Every day | ORAL | 0 refills | Status: DC

## 2020-02-18 MED FILL — LISINOPRIL 40 MG TABLET: 40 | 90 days supply | Qty: 90 | Fill #0

## 2020-02-18 MED FILL — hydrALAZINE HCL 100 MG TABS: 100 | 90 days supply | Qty: 270 | Fill #0

## 2020-02-18 MED FILL — CARVEDILOL 6.25 MG TABLET: 6.25 | 90 days supply | Qty: 180 | Fill #0

## 2020-02-18 MED FILL — ATORVASTATIN 10 MG TABLET: 10 | 90 days supply | Qty: 90 | Fill #0

## 2020-02-18 MED FILL — AMLODIPINE BESYLATE 10 MG T: 10 | 90 days supply | Qty: 90 | Fill #0
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
## 2020-02-23 ENCOUNTER — Other Ambulatory Visit: Payer: Self-pay

## 2020-02-23 ENCOUNTER — Other Ambulatory Visit: Payer: Self-pay | Admitting: Nurse Practitioner

## 2020-02-23 ENCOUNTER — Ambulatory Visit: Payer: Medicaid Other | Attending: Nurse Practitioner

## 2020-02-23 DIAGNOSIS — R7303 Prediabetes: Secondary | ICD-10-CM

## 2020-02-24 LAB — BASIC METABOLIC PANEL
BUN/Creatinine Ratio: 14 (ref 10–24)
BUN: 24 mg/dL (ref 8–27)
CO2: 23 mmol/L (ref 20–29)
Calcium: 9.3 mg/dL (ref 8.6–10.2)
Chloride: 100 mmol/L (ref 96–106)
Creatinine, Ser: 1.73 mg/dL — ABNORMAL HIGH (ref 0.76–1.27)
GFR calc Af Amer: 48 mL/min/{1.73_m2} — ABNORMAL LOW (ref >59)
GFR calc non Af Amer: 41 mL/min/{1.73_m2} — ABNORMAL LOW (ref >59)
Glucose: 84 mg/dL (ref 65–99)
Potassium: 3.9 mmol/L (ref 3.5–5.2)
Sodium: 139 mmol/L (ref 134–144)

## 2020-02-24 LAB — HEMOGLOBIN A1C
Est. average glucose Bld gHb Est-mCnc: 114 mg/dL
Hgb A1c MFr Bld: 5.6 % (ref 4.8–5.6)

## 2020-03-15 ENCOUNTER — Other Ambulatory Visit: Payer: Self-pay | Admitting: Nurse Practitioner

## 2020-03-15 DIAGNOSIS — I10 Essential (primary) hypertension: Secondary | ICD-10-CM

## 2020-03-15 NOTE — Telephone Encounter (Signed)
Please fill if appropriate.  

## 2020-03-16 MED ORDER — LISINOPRIL 40 MG PO TABS: 40.0000 mg | ORAL_TABLET | Freq: Every day | ORAL | 0 refills | Status: DC

## 2020-03-16 MED ORDER — CARVEDILOL 6.25 MG PO TABS: 6.2500 mg | ORAL_TABLET | Freq: Two times a day (BID) | ORAL | 0 refills | Status: DC

## 2020-03-16 MED ORDER — AMLODIPINE BESYLATE 10 MG PO TABS: 10.0000 mg | ORAL_TABLET | Freq: Every day | ORAL | 0 refills | Status: DC

## 2020-03-16 MED FILL — LISINOPRIL 40 MG TABLET: 40 | 90 days supply | Qty: 90 | Fill #0

## 2020-03-16 MED FILL — AMLODIPINE BESYLATE 10 MG T: 10 | 90 days supply | Qty: 90 | Fill #0

## 2020-03-16 MED FILL — CARVEDILOL 6.25 MG TABLET: 6.25 | 90 days supply | Qty: 180 | Fill #0

## 2020-03-22 ENCOUNTER — Encounter (HOSPITAL_COMMUNITY): Payer: Self-pay

## 2020-03-22 ENCOUNTER — Telehealth (INDEPENDENT_AMBULATORY_CARE_PROVIDER_SITE_OTHER): Payer: Self-pay | Admitting: Nurse Practitioner

## 2020-03-22 ENCOUNTER — Other Ambulatory Visit: Payer: Self-pay

## 2020-03-22 ENCOUNTER — Encounter (HOSPITAL_COMMUNITY): Payer: Self-pay | Admitting: *Deleted

## 2020-03-22 ENCOUNTER — Ambulatory Visit (HOSPITAL_COMMUNITY)
Admission: EM | Admit: 2020-03-22 | Discharge: 2020-03-22 | Payer: Medicaid Other | Attending: Family Medicine | Admitting: Family Medicine

## 2020-03-22 ENCOUNTER — Emergency Department (HOSPITAL_COMMUNITY)
Admission: EM | Admit: 2020-03-22 | Discharge: 2020-03-23 | Disposition: A | Discharge status: Home / Self Care | Payer: Medicaid Other | Attending: Internal Medicine | Admitting: Internal Medicine

## 2020-03-22 DIAGNOSIS — I160 Hypertensive urgency: Secondary | ICD-10-CM | POA: Diagnosis not present

## 2020-03-22 DIAGNOSIS — Z79899 Other long term (current) drug therapy: Secondary | ICD-10-CM | POA: Insufficient documentation

## 2020-03-22 DIAGNOSIS — I129 Hypertensive chronic kidney disease with stage 1 through stage 4 chronic kidney disease, or unspecified chronic kidney disease: Secondary | ICD-10-CM | POA: Diagnosis present

## 2020-03-22 DIAGNOSIS — R001 Bradycardia, unspecified: Secondary | ICD-10-CM | POA: Diagnosis not present

## 2020-03-22 DIAGNOSIS — R519 Headache, unspecified: Secondary | ICD-10-CM | POA: Diagnosis not present

## 2020-03-22 DIAGNOSIS — Z7982 Long term (current) use of aspirin: Secondary | ICD-10-CM | POA: Diagnosis not present

## 2020-03-22 DIAGNOSIS — Z20822 Contact with and (suspected) exposure to covid-19: Secondary | ICD-10-CM | POA: Diagnosis not present

## 2020-03-22 DIAGNOSIS — I10 Essential (primary) hypertension: Secondary | ICD-10-CM | POA: Diagnosis present

## 2020-03-22 DIAGNOSIS — N289 Disorder of kidney and ureter, unspecified: Secondary | ICD-10-CM | POA: Diagnosis not present

## 2020-03-22 DIAGNOSIS — N184 Chronic kidney disease, stage 4 (severe): Secondary | ICD-10-CM | POA: Insufficient documentation

## 2020-03-22 LAB — CBC
HCT: 48.6 % (ref 39.0–52.0)
Hemoglobin: 16 g/dL (ref 13.0–17.0)
MCH: 31.6 pg (ref 26.0–34.0)
MCHC: 32.9 g/dL (ref 30.0–36.0)
MCV: 96 fL (ref 80.0–100.0)
Platelets: 157 10*3/uL (ref 150–400)
RBC: 5.06 MIL/uL (ref 4.22–5.81)
RDW: 13 % (ref 11.5–15.5)
WBC: 5.4 10*3/uL (ref 4.0–10.5)
nRBC: 0 % (ref 0.0–0.2)

## 2020-03-22 LAB — BASIC METABOLIC PANEL
Anion gap: 9 (ref 5–15)
BUN: 20 mg/dL (ref 8–23)
CO2: 28 mmol/L (ref 22–32)
Calcium: 9.2 mg/dL (ref 8.9–10.3)
Chloride: 101 mmol/L (ref 98–111)
Creatinine, Ser: 1.93 mg/dL — ABNORMAL HIGH (ref 0.61–1.24)
GFR calc Af Amer: 42 mL/min — ABNORMAL LOW (ref >60)
GFR calc non Af Amer: 36 mL/min — ABNORMAL LOW (ref >60)
Glucose, Bld: 99 mg/dL (ref 70–99)
Potassium: 3.8 mmol/L (ref 3.5–5.1)
Sodium: 138 mmol/L (ref 135–145)

## 2020-03-22 NOTE — Discharge Instructions (Addendum)
We are sending you to the Emergency Department becausee of your headache and blood pressure being extremely high.  BP (!) 244/138 (BP Location: Right Arm)   Pulse (!) 50   Temp 98.7 F (37.1 C) (Oral)   Resp 20   SpO2 99%   Recheck before leaving here 256/131 (R arm).

## 2020-03-22 NOTE — ED Triage Notes (Signed)
Pt reports headache and HTN today. BP 260/151 in triage. Reports compliance with all his BP medications. Denies dizziness or vision changes, pt a.o

## 2020-03-22 NOTE — ED Notes (Signed)
Patient is being discharged from the Urgent Care and sent to the Emergency Department via personal vehicle by self . Per Dr Mannie Stabile, patient is in need of higher level of care due to hypertensive crisis . Patient is aware and verbalizes understanding of plan of care.    Vitals:   03/22/20 1720  BP: (!) 244/138  Pulse: (!) 50  Resp: 20  Temp: 98.7 F (37.1 C)  SpO2: 99%

## 2020-03-22 NOTE — Telephone Encounter (Signed)
Noted. PCP is aware.  

## 2020-03-22 NOTE — Telephone Encounter (Signed)
Copied from [place] [id number]. Topic: General - Inquiry >> Mar 22, 2020  1:46 PM Lennox Solders wrote: Reason for CRM:FYI pt is calling to let zelda know he is getting [facility] . Pt is not feeling well and pt head is hurting and he has a cough

## 2020-03-22 NOTE — ED Notes (Signed)
Dr. Mannie Stabile notified of of patient's BP and HR.

## 2020-03-22 NOTE — ED Triage Notes (Signed)
Patient in with complaints of cough and headache that started on yesterday. Patient denies fever or other symptoms. Patient states that his HR usually runs in the 40's-50s. Patient states that he gets a headache when his BP is elevated. Patient states he has taken his BP meds today. Patient denies chest pain. Patient states he has some SOB.

## 2020-03-23 DIAGNOSIS — I10 Essential (primary) hypertension: Secondary | ICD-10-CM

## 2020-03-23 LAB — SARS CORONAVIRUS 2 BY RT PCR (HOSPITAL ORDER, PERFORMED IN ~~LOC~~ HOSPITAL LAB): SARS Coronavirus 2: NEGATIVE

## 2020-03-23 MED ORDER — CARVEDILOL 3.125 MG PO TABS: 6.2500 mg | ORAL_TABLET | Freq: Two times a day (BID) | ORAL | Status: DC

## 2020-03-23 MED ORDER — CLONIDINE HCL 0.2 MG PO TABS
0.2000 mg | ORAL_TABLET | Freq: Once | ORAL | Status: AC
  Administered 2020-03-23: 0.2 mg via ORAL
  Filled 2020-03-23: qty 1

## 2020-03-23 MED ORDER — ENALAPRILAT 1.25 MG/ML IV SOLN
0.6250 mg | Freq: Once | INTRAVENOUS | Status: AC
  Administered 2020-03-23: 0.625 mg via INTRAVENOUS
  Filled 2020-03-23: qty 0.5

## 2020-03-23 MED ORDER — PROCHLORPERAZINE EDISYLATE 10 MG/2ML IJ SOLN
10.0000 mg | Freq: Once | INTRAMUSCULAR | Status: AC
  Administered 2020-03-23: 10 mg via INTRAVENOUS
  Filled 2020-03-23: qty 2

## 2020-03-23 MED ORDER — SODIUM CHLORIDE 0.9 % IV BOLUS
1000.0000 mL | Freq: Once | INTRAVENOUS | Status: AC
  Administered 2020-03-23: 1000 mL via INTRAVENOUS

## 2020-03-23 MED ORDER — AMLODIPINE BESYLATE 5 MG PO TABS: 10.0000 mg | ORAL_TABLET | Freq: Every day | ORAL | Status: DC

## 2020-03-23 MED ORDER — LISINOPRIL 20 MG PO TABS: 40.0000 mg | ORAL_TABLET | Freq: Every day | ORAL | Status: DC

## 2020-03-23 MED ORDER — CLONIDINE HCL 0.2 MG PO TABS: 0.2000 mg | ORAL_TABLET | Freq: Three times a day (TID) | ORAL | 0 refills | Status: DC

## 2020-03-23 MED ORDER — ATORVASTATIN CALCIUM 10 MG PO TABS: 10.0000 mg | ORAL_TABLET | Freq: Every day | ORAL | Status: DC

## 2020-03-23 MED ORDER — ASPIRIN 81 MG PO CHEW: 81.0000 mg | CHEWABLE_TABLET | Freq: Every day | ORAL | Status: DC

## 2020-03-23 MED ORDER — LABETALOL HCL 5 MG/ML IV SOLN
20.0000 mg | INTRAVENOUS | Status: DC | PRN
  Administered 2020-03-23: 20 mg via INTRAVENOUS
  Filled 2020-03-23: qty 4

## 2020-03-23 MED ORDER — ACETAMINOPHEN 325 MG PO TABS
650.0000 mg | ORAL_TABLET | Freq: Once | ORAL | Status: AC
  Administered 2020-03-23: 650 mg via ORAL
  Filled 2020-03-23: qty 2

## 2020-03-23 NOTE — ED Notes (Signed)
Report rec'd from [facility]. Pt is resting in bed. Monitor in place.
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
## 2020-03-23 NOTE — Discharge Instructions (Addendum)
You are leaving against medical advice. I am concerned about your blood pressure,and I feel it needs to be treated now. Inadequately treated high blood pressure leads to heart attacks, strokes, and kidney failure. If you change your mind, please return at any time.  Please check your blood pressure once a day, and keep a record of it. Take that record with you when you see your primary care provider.  Make sure to take your medication exactly as prescribed. If any medications need to be changed, discuss it with your primary care provider before making the change.

## 2020-03-23 NOTE — ED Notes (Signed)
ED Provider at bedside. 
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
## 2020-03-24 ENCOUNTER — Telehealth: Payer: Self-pay | Admitting: *Deleted

## 2020-03-24 NOTE — Telephone Encounter (Signed)
Transition Care Management Follow-up Telephone Call  Date of discharge and from where: 03/23/2020 [facility] ED  How have you been since you were released from the hospital? "Feeling much better"  Any questions or concerns? No  Items Reviewed:  Did the pt receive and understand the discharge instructions provided? Yes   Medications obtained and verified? Yes   Any new allergies since your discharge? No   Dietary orders reviewed? Yes  Do you have support at home? Yes   Functional Questionnaire: (I = Independent and D = Dependent) ADLs: I  Bathing/Dressing- I  Meal Prep- I  Eating- I  Maintaining continence- I  Transferring/Ambulation- I  Managing Meds- I  Follow up appointments reviewed:   PCP Hospital f/u appt confirmed? Yes  Scheduled to see Dr. Raul Del on 03/31/2020 @ 1430.  [facility] f/u appt confirmed? No    Are transportation arrangements needed? No   If their condition worsens, is the pt aware to call PCP or go to the Emergency Dept.? Yes  Was the patient provided with contact information for the PCP's office or ED? Yes  Was to pt encouraged to call back with questions or concerns? Yes
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
## 2020-03-29 ENCOUNTER — Telehealth: Payer: Self-pay | Admitting: Nurse Practitioner

## 2020-03-29 NOTE — Telephone Encounter (Signed)
Please have him bring all of his medications to his appointment and his blood pressure device. Thanks!

## 2020-03-29 NOTE — Telephone Encounter (Signed)
Returned patient call. Verified name and DOB.   Patient left hospital/ ED AMA on 9/22. He  States he was uncomfortable while he was there and when he got home his blood pressure had come down.   He has at home and is taking Amlodipine, Carvedilol, and Lisinopril as directed. Unable to afford Clonidine that was sent to pharmacy from Dr. Roxanne Mins, [place].  Advised patient to come to [facility] to pick medication and speak to pharmacy about obtaining medication.   He states his BP has come down. It's 200/130. He took it this morning. Does not sound in distress. Patient is driving during conversation unable to get BP at this time. Denies HA, chest pain, blurred vision.   Encouraged to drink plenty of water, not eat salty food, rest and pick up Rx from pharmacy tomorrow. Informed pharmacy closes at 1515. Unable to get them today.  Also advised to keep apt for Friday 03/31/2020 at 2:30.   pls advise if otherwise.

## 2020-03-29 NOTE — Telephone Encounter (Signed)
No appt available before 03/31/20   Copied from [place] #[id number]. Topic: General - Other >> Mar 24, 2020 12:09 PM Leward Quan A wrote: Reason for CRM: Patient called to inform Dr Geryl Rankins that he was informed that he just came out the hospital due to elevated BP 286/125 but it is now under control and was wondering if he could come in to be seen before 03/31/20 when he originally have an appointment. Please advise Ph# [phone]

## 2020-03-30 MED FILL — CARVEDILOL 6.25 MG TABLET: 6.25 | 90 days supply | Qty: 180 | Fill #0

## 2020-03-30 MED FILL — LISINOPRIL 40 MG TABLET: 40 | 90 days supply | Qty: 90 | Fill #0

## 2020-03-30 MED FILL — AMLODIPINE BESYLATE 10 MG T: 10 | 90 days supply | Qty: 90 | Fill #0

## 2020-03-30 NOTE — Telephone Encounter (Signed)
Will do!

## 2020-03-31 ENCOUNTER — Other Ambulatory Visit: Payer: Self-pay | Admitting: Nurse Practitioner

## 2020-03-31 ENCOUNTER — Encounter: Payer: Self-pay | Admitting: Nurse Practitioner

## 2020-03-31 ENCOUNTER — Other Ambulatory Visit: Payer: Self-pay

## 2020-03-31 ENCOUNTER — Ambulatory Visit: Payer: Medicaid Other | Attending: Nurse Practitioner | Admitting: Nurse Practitioner

## 2020-03-31 DIAGNOSIS — I10 Essential (primary) hypertension: Secondary | ICD-10-CM

## 2020-03-31 DIAGNOSIS — E785 Hyperlipidemia, unspecified: Secondary | ICD-10-CM

## 2020-03-31 MED ORDER — ATORVASTATIN CALCIUM 10 MG PO TABS: 10.0000 mg | ORAL_TABLET | Freq: Every day | ORAL | 3 refills | Status: DC

## 2020-03-31 MED ORDER — ATORVASTATIN CALCIUM 40 MG PO TABS: 40.0000 mg | ORAL_TABLET | Freq: Every day | ORAL | 2 refills | Status: DC

## 2020-03-31 MED ORDER — CLONIDINE HCL 0.2 MG PO TABS: 0.2000 mg | ORAL_TABLET | Freq: Two times a day (BID) | ORAL | 1 refills | Status: DC

## 2020-03-31 MED FILL — cloNIDine HCL 0.2 MG TABS: 0.2 | 30 days supply | Qty: 60 | Fill #0

## 2020-03-31 MED FILL — ATORVASTATIN 10 MG TABLET: 10 | 30 days supply | Qty: 30 | Fill #0
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
## 2020-04-02 ENCOUNTER — Encounter: Payer: Self-pay | Admitting: Nurse Practitioner

## 2020-04-04 ENCOUNTER — Other Ambulatory Visit: Payer: Self-pay | Admitting: Nurse Practitioner

## 2020-04-04 DIAGNOSIS — I10 Essential (primary) hypertension: Secondary | ICD-10-CM

## 2020-04-04 MED ORDER — HYDRALAZINE HCL 25 MG PO TABS: 25.0000 mg | ORAL_TABLET | Freq: Three times a day (TID) | ORAL | 1 refills | Status: DC

## 2020-04-04 MED ORDER — CLONIDINE HCL 0.1 MG PO TABS: 0.1000 mg | ORAL_TABLET | Freq: Two times a day (BID) | ORAL | 0 refills | Status: DC

## 2020-04-13 ENCOUNTER — Ambulatory Visit (INDEPENDENT_AMBULATORY_CARE_PROVIDER_SITE_OTHER): Payer: Medicaid Other | Admitting: Internal Medicine

## 2020-04-13 ENCOUNTER — Encounter: Payer: Self-pay | Admitting: Internal Medicine

## 2020-04-13 ENCOUNTER — Other Ambulatory Visit: Payer: Self-pay

## 2020-04-13 VITALS — BP 140/91 | HR 50 | Ht 66.0 in | Wt 182.2 lb

## 2020-04-13 DIAGNOSIS — R0683 Snoring: Secondary | ICD-10-CM

## 2020-04-13 DIAGNOSIS — I10 Essential (primary) hypertension: Secondary | ICD-10-CM | POA: Diagnosis not present

## 2020-04-13 NOTE — Patient Instructions (Signed)
Medication Instructions:  No changes *If you need a refill on your cardiac medications before your next appointment, please call your pharmacy*   Lab Work: none If you have labs (blood work) drawn today and your tests are completely normal, you will receive your results only by: Marland Kitchen MyChart Message (if you have MyChart) OR . A paper copy in the mail If you have any lab test that is abnormal or we need to change your treatment, we will call you to review the results.   Testing/Procedures: Your physician has recommended that you have a sleep study. This test records several body functions during sleep, including: brain activity, eye movement, oxygen and carbon dioxide blood levels, heart rate and rhythm, breathing rate and rhythm, the flow of air through your mouth and nose, snoring, body muscle movements, and chest and belly movement.   Follow-Up:   Other Instructions Monitor BP at home with cuff given today.
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
## 2020-04-14 ENCOUNTER — Other Ambulatory Visit: Payer: Self-pay

## 2020-04-14 ENCOUNTER — Ambulatory Visit: Payer: Medicaid Other | Attending: Nurse Practitioner | Admitting: Pharmacist

## 2020-04-14 ENCOUNTER — Encounter: Payer: Self-pay | Admitting: Pharmacist

## 2020-04-14 VITALS — BP 120/78 | HR 53

## 2020-04-14 DIAGNOSIS — I10 Essential (primary) hypertension: Secondary | ICD-10-CM

## 2020-04-14 NOTE — Progress Notes (Signed)
° °  S:    PCP: Zelda  Patient arrives in good spirits. Presents to the clinic for hypertension evaluation, counseling, and management.  Patient was referred and last seen by Primary Care Provider on 03/31/2020.   Medication adherence reported. He is on four medications for BP. He reports that he believes his BP was elevated in the past due to stopping his hydralazine (03/22/20) and not having "anything to replace it". Today, he reports that he believes his BP is better controlled because of the clonidine he was placed on on 03/31/20 OV.   He reports that he feels well today and denies CP, dyspnea, HA or blurred vision.   Current BP Medications include:  Amlodipine 10 mg daily, carvedilol 6.25 mg BID, clonidine 0.1 mg BID, lisinopril 40 mg daily   Dietary habits include: compliant with salt restriction; denies excessive caffeine intake  Exercise habits include: none reported  Family / Social history:  - FHx: HTN - Tobacco: never smoker - Alcohol: no current alcohol use     O:  Vitals:   04/14/20 1509  BP: 120/78  Pulse: (!) 53    Home BP readings: none   Last 3 Office BP readings: BP Readings from Last 3 Encounters:  04/14/20 120/78  04/13/20 (!) 140/91  03/31/20 (!) 172/105    BMET    Component Value Date/Time   NA 138 03/22/2020 1807   NA 139 02/23/2020 1432   K 3.8 03/22/2020 1807   CL 101 03/22/2020 1807   CO2 28 03/22/2020 1807   GLUCOSE 99 03/22/2020 1807   BUN 20 03/22/2020 1807   BUN 24 02/23/2020 1432   CREATININE 1.93 (H) 03/22/2020 1807   CREATININE 1.41 (H) 07/02/2016 1143   CALCIUM 9.2 03/22/2020 1807   GFRNONAA 36 (L) 03/22/2020 1807   GFRNONAA 54 (L) 07/02/2016 1143   GFRAA 42 (L) 03/22/2020 1807   GFRAA 63 07/02/2016 1143    Renal function: CrCl cannot be calculated (Patient's most recent lab result is older than the maximum 21 days allowed.).  Clinical ASCVD: No  The 10-year ASCVD risk score [name] DC Jr., et al., 2013) is: 13.3%   Values used to  calculate the score:     Age: 64 years     Sex: Male     Is Non-Hispanic African American: Yes     Diabetic: No     Tobacco smoker: No     Systolic Blood Pressure: 408 mmHg     Is BP treated: Yes     HDL Cholesterol: 45 mg/dL     Total Cholesterol: 177 mg/dL  A/P: Hypertension longstanding currently at goal on current medications. BP Goal = < 130/80 mmHg. Medication adherence reported. I checked his BP manually and electronically to verify that his BP is at goal today.  -Continued current medications.  -Counseled on lifestyle modifications for blood pressure control including reduced dietary sodium, increased exercise, adequate sleep.  Results reviewed and written information provided.   Total time in face-to-face counseling 15 minutes.   F/U Clinic Visit in 1 month for recheck.   Benard Halsted, PharmD, [place] [phone]

## 2020-04-16 ENCOUNTER — Telehealth: Payer: Self-pay | Admitting: Internal Medicine

## 2020-04-16 NOTE — Telephone Encounter (Signed)
Review of med: I would recomm hydrochlorothiazide 12.5 mg  Along with other meds  F/U in HTN clinic in 4 to 6 weeks BMET in 10 days  Keep on other meds

## 2020-04-19 ENCOUNTER — Telehealth: Payer: Self-pay | Admitting: Internal Medicine

## 2020-04-19 NOTE — Telephone Encounter (Signed)
Authorization pending, auth # [id number]

## 2020-04-28 ENCOUNTER — Other Ambulatory Visit: Payer: Self-pay

## 2020-04-28 ENCOUNTER — Ambulatory Visit (HOSPITAL_COMMUNITY)
Admission: EM | Admit: 2020-04-28 | Discharge: 2020-04-28 | Disposition: A | Discharge status: Home / Self Care | Payer: Medicaid Other | Attending: Family Medicine | Admitting: Family Medicine

## 2020-04-28 ENCOUNTER — Encounter (HOSPITAL_COMMUNITY): Payer: Self-pay | Admitting: Family Medicine

## 2020-04-28 DIAGNOSIS — R079 Chest pain, unspecified: Secondary | ICD-10-CM | POA: Diagnosis not present

## 2020-04-28 NOTE — Discharge Instructions (Addendum)
I believe this is musculoskeletal related. Rest, gentle stretching and alternate heat and ice. You can use the Voltaren gel that you have to help with pain For any worsening problems you need to go straight to the ER. Otherwise call your cardiologist for follow-up
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
## 2020-05-01 NOTE — Addendum Note (Signed)
Addended by: Patterson Hammersmith A on: 05/01/2020 01:37 PM   Modules accepted: Orders

## 2020-05-03 MED FILL — ATORVASTATIN 10 MG TABLET: 10 | 30 days supply | Qty: 30 | Fill #1

## 2020-05-15 ENCOUNTER — Ambulatory Visit: Payer: Medicaid Other | Attending: Nurse Practitioner | Admitting: Pharmacist

## 2020-05-15 ENCOUNTER — Other Ambulatory Visit: Payer: Self-pay

## 2020-05-15 ENCOUNTER — Encounter: Payer: Self-pay | Admitting: Pharmacist

## 2020-05-15 VITALS — BP 126/83 | HR 48

## 2020-05-15 DIAGNOSIS — I10 Essential (primary) hypertension: Secondary | ICD-10-CM

## 2020-05-15 NOTE — Progress Notes (Signed)
° °  S:    PCP: Zelda  Patient arrives in good spirits. Presents to the clinic for hypertension evaluation, counseling, and management.  Patient was referred and last seen by Primary Care Provider on 03/31/2020.   Medication adherence reported.  He reports that he feels well today and denies CP, dyspnea, HA or blurred vision.   Current BP Medications include:  Amlodipine 10 mg daily, carvedilol 6.25 mg BID, clonidine 0.1 mg BID, lisinopril 40 mg daily   Dietary habits include: compliant with salt restriction; denies excessive caffeine intake  Exercise habits include: none reported  Family / Social history:  - FHx: HTN - Tobacco: never smoker - Alcohol: no current alcohol use     O:  Vitals:   05/15/20 1441  BP: 126/83  Pulse: (!) 48    Home BP readings: none   Last 3 Office BP readings: BP Readings from Last 3 Encounters:  05/15/20 126/83  04/28/20 (!) 134/93  04/14/20 120/78    BMET    Component Value Date/Time   NA 138 03/22/2020 1807   NA 139 02/23/2020 1432   K 3.8 03/22/2020 1807   CL 101 03/22/2020 1807   CO2 28 03/22/2020 1807   GLUCOSE 99 03/22/2020 1807   BUN 20 03/22/2020 1807   BUN 24 02/23/2020 1432   CREATININE 1.93 (H) 03/22/2020 1807   CREATININE 1.41 (H) 07/02/2016 1143   CALCIUM 9.2 03/22/2020 1807   GFRNONAA 36 (L) 03/22/2020 1807   GFRNONAA 54 (L) 07/02/2016 1143   GFRAA 42 (L) 03/22/2020 1807   GFRAA 63 07/02/2016 1143    Renal function: CrCl cannot be calculated (Patient's most recent lab result is older than the maximum 21 days allowed.).  Clinical ASCVD: No  The 10-year ASCVD risk score [name] DC Jr., et al., 2013) is: 14.5%   Values used to calculate the score:     Age: 64 years     Sex: Male     Is Non-Hispanic African American: Yes     Diabetic: No     Tobacco smoker: No     Systolic Blood Pressure: 803 mmHg     Is BP treated: Yes     HDL Cholesterol: 45 mg/dL     Total Cholesterol: 177 mg/dL  A/P: Hypertension longstanding  currently at goal on current medications. BP Goal = < 130/80 mmHg. Medication adherence reported. -Continued current medications.  -Counseled on lifestyle modifications for blood pressure control including reduced dietary sodium, increased exercise, adequate sleep.  Results reviewed and written information provided.   Total time in face-to-face counseling 15 minutes.   F/U Clinic Visit with PCP.   Benard Halsted, PharmD, [place] [phone]

## 2020-05-20 ENCOUNTER — Emergency Department (HOSPITAL_COMMUNITY)
Admission: EM | Admit: 2020-05-20 | Discharge: 2020-05-20 | Disposition: A | Discharge status: Other Acute Inpatient Hospital | Payer: Medicaid Other

## 2020-05-20 ENCOUNTER — Ambulatory Visit (HOSPITAL_BASED_OUTPATIENT_CLINIC_OR_DEPARTMENT_OTHER): Payer: Medicaid Other | Attending: Internal Medicine | Admitting: Cardiology

## 2020-05-20 ENCOUNTER — Other Ambulatory Visit: Payer: Self-pay

## 2020-05-20 VITALS — Ht 66.0 in | Wt 180.0 lb

## 2020-05-20 DIAGNOSIS — G4733 Obstructive sleep apnea (adult) (pediatric): Secondary | ICD-10-CM | POA: Diagnosis not present

## 2020-05-20 DIAGNOSIS — I493 Ventricular premature depolarization: Secondary | ICD-10-CM | POA: Diagnosis not present

## 2020-05-20 DIAGNOSIS — I10 Essential (primary) hypertension: Secondary | ICD-10-CM | POA: Insufficient documentation

## 2020-05-20 DIAGNOSIS — R0683 Snoring: Secondary | ICD-10-CM | POA: Diagnosis present

## 2020-05-22 ENCOUNTER — Other Ambulatory Visit (HOSPITAL_BASED_OUTPATIENT_CLINIC_OR_DEPARTMENT_OTHER): Payer: Self-pay

## 2020-05-22 DIAGNOSIS — I10 Essential (primary) hypertension: Secondary | ICD-10-CM

## 2020-05-22 DIAGNOSIS — R0683 Snoring: Secondary | ICD-10-CM

## 2020-05-22 NOTE — Procedures (Signed)
Patient Name: Corey Guerrero, Stick Date: 05/20/2020 Gender: Male D.O.B: 1955/12/05 Age (years): 64 Referring Provider: Dorris Carnes Height (inches): 7 Interpreting Physician: Fransico Him MD, ABSM Weight (lbs): 180 RPSGT: [place]ug[place], [place] BMI: 29 MRN: [id number] Neck Size: 17.50  CLINICAL INFORMATION Sleep Study Type: Split Night CPAP  Indication for sleep study: Hypertension, Uncontrolled Hypertension  Epworth Sleepiness Score: 6  SLEEP STUDY TECHNIQUE As per the AASM Manual for the Scoring of Sleep and Associated Events v2.3 (April 2016) with a hypopnea requiring 4% desaturations.  The channels recorded and monitored were frontal, central and occipital EEG, electrooculogram (EOG), submentalis EMG (chin), nasal and oral airflow, thoracic and abdominal wall motion, anterior tibialis EMG, snore microphone, electrocardiogram, and pulse oximetry. Continuous positive airway pressure (CPAP) was initiated when the patient met split night criteria and was titrated according to treat sleep-disordered breathing.  MEDICATIONS Medications self-administered by patient taken the night of the study : N/A  RESPIRATORY PARAMETERS Diagnostic Total AHI (/hr): 43.2  RDI (/hr):49.5  OA Index (/hr): 15.9  CA Index (/hr): 0.8 REM AHI (/hr): 60.0  NREM AHI (/hr):37.6  Supine AHI (/hr):43.2  Non-supine AHI (/hr):0 Min O2 Sat (%):75.0  Mean O2 (%): 91.0  Time below 88% (min):14.4   Titration Optimal Pressure (cm):11  AHI at Optimal Pressure (/hr):10.2  Min O2 at Optimal Pressure (%):92.0 Supine % at Optimal (%):100  Sleep % at Optimal (%):41   SLEEP ARCHITECTURE The recording time for the entire night was 360.6 minutes.  During a baseline period of 172.9 minutes, the patient slept for 143.0 minutes in REM and nonREM, yielding a sleep efficiency of 82.7%. Sleep onset after lights out was 17.2 minutes with a REM latency of 53.5 minutes. The patient spent 18.9% of the night in stage N1  sleep, 55.9% in stage N2 sleep, 0.0% in stage N3 and 25.2% in REM.  During the titration period of 180.2 minutes, the patient slept for 132.0 minutes in REM and nonREM, yielding a sleep efficiency of 73.3%. Sleep onset after CPAP initiation was 4.3 minutes with a REM latency of 68.5 minutes. The patient spent 18.2% of the night in stage N1 sleep, 64.0% in stage N2 sleep, 0.0% in stage N3 and 17.8% in REM.  CARDIAC DATA The 2 lead EKG demonstrated sinus rhythm. The mean heart rate was 100.0 beats per minute. Other EKG findings include: PVCs and PACs.  LEG MOVEMENT DATA The total Periodic Limb Movements of Sleep (PLMS) were 0. The PLMS index was 0.0 .  IMPRESSIONS - Severe obstructive sleep apnea occurred during the diagnostic portion of the study (AHI = 43.2/hour). An optimal PAP pressure was selected for this patient ( 11 cm of water) - Mild central sleep apnea occurred during the diagnostic portion of the study (CAI = 0.8/hour). - Severe oxygen desaturation was noted during the diagnostic portion of the study (Min O2 = 75.0%). - The patient snored with loud snoring volume during the diagnostic portion of the study. - EKG findings include PVCs and PACs. - Clinically significant periodic limb movements did not occur during sleep.  DIAGNOSIS - Obstructive Sleep Apnea (G47.33)  RECOMMENDATIONS - Trial of auto CPAP therapy for 4 to 15cm H2O  with a Large size Fisher&Paykel Nasal Mask Eson mask and heated humidification. - Avoid alcohol, sedatives and other CNS depressants that may worsen sleep apnea and disrupt normal sleep architecture. - Sleep hygiene should be reviewed to assess factors that may improve sleep quality. - Weight management and regular exercise should be initiated  or continued. - Return to Sleep Center for re-evaluation after 8 weeks of therapy  [Electronically signed] 05/22/2020 11:34 PM  Fransico Him MD, ABSM Diplomate, American Board of Sleep Medicine

## 2020-05-28 ENCOUNTER — Telehealth: Payer: Self-pay | Admitting: *Deleted

## 2020-05-28 NOTE — Telephone Encounter (Signed)
-----   Message from Sueanne Margarita, MD sent at 05/22/2020 11:38 PM EST ----- Please let patient know that they have sleep apnea and recommend auto CPAP titration through local DME and order has been placed in Epic - followup with me in 8 weeks

## 2020-05-28 NOTE — Telephone Encounter (Signed)
Informed patient of sleep study results and patient understanding was verbalized. Patient understands her sleep study showed they have sleep apnea and recommend auto CPAP titration through local DME and order has been placed in Epic - followup with me in 8 weeks       Upon patient request DME selection is [facility]. Patient understands she/he will be contacted by [facility] to set up her/he cpap. Patient understands to call if [facility] does not contact her/he with new setup in a timely manner. Patient understands they will be called once confirmation has been received from [facility] that they have received their new machine to schedule 10 week follow up appointment.   [facility] notified of new cpap order  Please add to airview Patient was grateful for the call and thanked me.

## 2020-05-29 MED FILL — ATORVASTATIN 10 MG TABLET: 10 | 30 days supply | Qty: 30 | Fill #2

## 2020-05-29 MED FILL — cloNIDine HCL 0.2 MG TABS: 0.2 | 30 days supply | Qty: 60 | Fill #1

## 2020-05-31 ENCOUNTER — Encounter: Payer: Self-pay | Admitting: *Deleted

## 2020-05-31 NOTE — Telephone Encounter (Signed)
This encounter was created in error - please disregard.

## 2020-05-31 NOTE — Telephone Encounter (Signed)
-----   Message from Sueanne Margarita, MD sent at 05/22/2020 11:38 PM EST ----- Please let patient know that they have sleep apnea and recommend auto CPAP titration through local DME and order has been placed in Epic - followup with me in 8 weeks

## 2020-06-16 ENCOUNTER — Other Ambulatory Visit: Payer: Self-pay | Admitting: Nurse Practitioner

## 2020-06-16 ENCOUNTER — Other Ambulatory Visit: Payer: Self-pay

## 2020-06-16 ENCOUNTER — Encounter: Payer: Self-pay | Admitting: Nurse Practitioner

## 2020-06-16 ENCOUNTER — Ambulatory Visit: Payer: Medicaid Other | Attending: Nurse Practitioner | Admitting: Nurse Practitioner

## 2020-06-16 DIAGNOSIS — R0981 Nasal congestion: Secondary | ICD-10-CM

## 2020-06-16 DIAGNOSIS — N521 Erectile dysfunction due to diseases classified elsewhere: Secondary | ICD-10-CM | POA: Diagnosis not present

## 2020-06-16 DIAGNOSIS — E785 Hyperlipidemia, unspecified: Secondary | ICD-10-CM | POA: Diagnosis not present

## 2020-06-16 DIAGNOSIS — I10 Essential (primary) hypertension: Secondary | ICD-10-CM

## 2020-06-16 DIAGNOSIS — K219 Gastro-esophageal reflux disease without esophagitis: Secondary | ICD-10-CM

## 2020-06-16 MED ORDER — FLUTICASONE PROPIONATE 50 MCG/ACT NA SUSP: 2.0000 | Freq: Every day | NASAL | 6 refills | Status: DC

## 2020-06-16 MED ORDER — LISINOPRIL 40 MG PO TABS: 40.0000 mg | ORAL_TABLET | Freq: Every day | ORAL | 0 refills | Status: DC

## 2020-06-16 MED ORDER — OMEPRAZOLE 20 MG PO CPDR: 20.0000 mg | DELAYED_RELEASE_CAPSULE | Freq: Every day | ORAL | 1 refills | Status: DC

## 2020-06-16 MED ORDER — AMLODIPINE BESYLATE 10 MG PO TABS: 10.0000 mg | ORAL_TABLET | Freq: Every day | ORAL | 0 refills | Status: DC

## 2020-06-16 MED ORDER — CARVEDILOL 6.25 MG PO TABS: 6.2500 mg | ORAL_TABLET | Freq: Two times a day (BID) | ORAL | 0 refills | Status: DC

## 2020-06-16 MED FILL — FLUTICASONE PROP 50 MCG SPR: 50 | 30 days supply | Qty: 16 | Fill #0

## 2020-06-16 MED FILL — OMEPRAZOLE 20 MG CAP: 20 | 90 days supply | Qty: 90 | Fill #0
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
## 2020-06-20 ENCOUNTER — Other Ambulatory Visit: Payer: Medicaid Other

## 2020-06-20 ENCOUNTER — Other Ambulatory Visit: Payer: Self-pay | Admitting: Nurse Practitioner

## 2020-06-20 DIAGNOSIS — I10 Essential (primary) hypertension: Secondary | ICD-10-CM

## 2020-06-27 MED FILL — AMLODIPINE BESYLATE 10 MG T: 10 | 90 days supply | Qty: 90 | Fill #0

## 2020-06-27 MED FILL — LISINOPRIL 40 MG TABLET: 40 | 90 days supply | Qty: 90 | Fill #0

## 2020-06-30 MED FILL — ATORVASTATIN 10 MG TABLET: 10 | 30 days supply | Qty: 30 | Fill #3

## 2020-07-13 ENCOUNTER — Encounter: Payer: Self-pay | Admitting: Nurse Practitioner

## 2020-07-18 ENCOUNTER — Ambulatory Visit: Payer: Medicaid Other | Attending: Nurse Practitioner

## 2020-07-18 ENCOUNTER — Other Ambulatory Visit: Payer: Self-pay

## 2020-07-18 DIAGNOSIS — I10 Essential (primary) hypertension: Secondary | ICD-10-CM

## 2020-07-19 LAB — CMP14+EGFR
ALT: 28 IU/L (ref 0–44)
AST: 24 IU/L (ref 0–40)
Albumin/Globulin Ratio: 1.5 (ref 1.2–2.2)
Albumin: 4.3 g/dL (ref 3.8–4.8)
Alkaline Phosphatase: 66 IU/L (ref 44–121)
BUN/Creatinine Ratio: 10 (ref 10–24)
BUN: 19 mg/dL (ref 8–27)
Bilirubin Total: 0.5 mg/dL (ref 0.0–1.2)
CO2: 27 mmol/L (ref 20–29)
Calcium: 9.7 mg/dL (ref 8.6–10.2)
Chloride: 103 mmol/L (ref 96–106)
Creatinine, Ser: 1.81 mg/dL — ABNORMAL HIGH (ref 0.76–1.27)
GFR calc Af Amer: 45 mL/min/{1.73_m2} — ABNORMAL LOW (ref >59)
GFR calc non Af Amer: 39 mL/min/{1.73_m2} — ABNORMAL LOW (ref >59)
Globulin, Total: 2.8 g/dL (ref 1.5–4.5)
Glucose: 93 mg/dL (ref 65–99)
Potassium: 3.9 mmol/L (ref 3.5–5.2)
Sodium: 142 mmol/L (ref 134–144)
Total Protein: 7.1 g/dL (ref 6.0–8.5)

## 2020-07-24 ENCOUNTER — Other Ambulatory Visit: Payer: Self-pay | Admitting: Nurse Practitioner

## 2020-07-31 MED FILL — ATORVASTATIN 10 MG TABLET: 10 | 30 days supply | Qty: 30 | Fill #4

## 2020-07-31 MED FILL — FLUTICASONE PROP 50 MCG SPR: 50 | 30 days supply | Qty: 16 | Fill #1

## 2020-08-08 ENCOUNTER — Encounter: Payer: Self-pay | Admitting: Nurse Practitioner

## 2020-08-15 MED FILL — CARVEDILOL 6.25 MG TABLET: 6.25 | 90 days supply | Qty: 180 | Fill #0

## 2020-09-06 ENCOUNTER — Telehealth: Payer: Self-pay | Admitting: Nurse Practitioner

## 2020-09-06 NOTE — Telephone Encounter (Signed)
Provider out the office 3/15 but working virtual. Called Pt no answer. Left vm that appt will be virtual but if he rather have in person to call [phone] to reschedule in person visit

## 2020-09-09 ENCOUNTER — Encounter: Payer: Self-pay | Admitting: Nurse Practitioner

## 2020-09-10 ENCOUNTER — Other Ambulatory Visit: Payer: Self-pay | Admitting: Nurse Practitioner

## 2020-09-10 DIAGNOSIS — I10 Essential (primary) hypertension: Secondary | ICD-10-CM

## 2020-09-10 MED ORDER — CLONIDINE HCL 0.1 MG PO TABS: 0.1000 mg | ORAL_TABLET | Freq: Two times a day (BID) | ORAL | 0 refills | Status: DC

## 2020-09-12 ENCOUNTER — Encounter: Payer: Self-pay | Admitting: Nurse Practitioner

## 2020-09-12 ENCOUNTER — Ambulatory Visit: Payer: Medicare Other | Admitting: Nurse Practitioner

## 2020-09-12 ENCOUNTER — Telehealth: Payer: Self-pay | Admitting: Nurse Practitioner

## 2020-09-12 ENCOUNTER — Other Ambulatory Visit: Payer: Self-pay

## 2020-09-12 NOTE — Telephone Encounter (Signed)
LVM to return call to office for appt

## 2020-09-24 ENCOUNTER — Other Ambulatory Visit (HOSPITAL_COMMUNITY): Payer: Self-pay | Admitting: Emergency Medicine

## 2020-09-24 ENCOUNTER — Other Ambulatory Visit: Payer: Self-pay

## 2020-09-24 ENCOUNTER — Emergency Department (HOSPITAL_COMMUNITY): Payer: Medicare Other

## 2020-09-24 ENCOUNTER — Emergency Department (HOSPITAL_COMMUNITY)
Admission: EM | Admit: 2020-09-24 | Discharge: 2020-09-25 | Disposition: A | Discharge status: Home / Self Care | Payer: Medicare Other | Attending: Emergency Medicine | Admitting: Emergency Medicine

## 2020-09-24 ENCOUNTER — Encounter (HOSPITAL_COMMUNITY): Payer: Self-pay

## 2020-09-24 DIAGNOSIS — Z7982 Long term (current) use of aspirin: Secondary | ICD-10-CM | POA: Insufficient documentation

## 2020-09-24 DIAGNOSIS — R079 Chest pain, unspecified: Secondary | ICD-10-CM | POA: Diagnosis not present

## 2020-09-24 DIAGNOSIS — I129 Hypertensive chronic kidney disease with stage 1 through stage 4 chronic kidney disease, or unspecified chronic kidney disease: Secondary | ICD-10-CM | POA: Diagnosis not present

## 2020-09-24 DIAGNOSIS — N184 Chronic kidney disease, stage 4 (severe): Secondary | ICD-10-CM | POA: Insufficient documentation

## 2020-09-24 DIAGNOSIS — Z79899 Other long term (current) drug therapy: Secondary | ICD-10-CM | POA: Diagnosis not present

## 2020-09-24 LAB — CBC
HCT: 46.5 % (ref 39.0–52.0)
Hemoglobin: 15.8 g/dL (ref 13.0–17.0)
MCH: 31.6 pg (ref 26.0–34.0)
MCHC: 34 g/dL (ref 30.0–36.0)
MCV: 93 fL (ref 80.0–100.0)
Platelets: 182 10*3/uL (ref 150–400)
RBC: 5 MIL/uL (ref 4.22–5.81)
RDW: 12.4 % (ref 11.5–15.5)
WBC: 6.1 10*3/uL (ref 4.0–10.5)
nRBC: 0 % (ref 0.0–0.2)

## 2020-09-24 LAB — BASIC METABOLIC PANEL
Anion gap: 8 (ref 5–15)
BUN: 27 mg/dL — ABNORMAL HIGH (ref 8–23)
CO2: 26 mmol/L (ref 22–32)
Calcium: 9.2 mg/dL (ref 8.9–10.3)
Chloride: 103 mmol/L (ref 98–111)
Creatinine, Ser: 1.88 mg/dL — ABNORMAL HIGH (ref 0.61–1.24)
GFR, Estimated: 40 mL/min — ABNORMAL LOW (ref >60)
Glucose, Bld: 106 mg/dL — ABNORMAL HIGH (ref 70–99)
Potassium: 3.8 mmol/L (ref 3.5–5.1)
Sodium: 137 mmol/L (ref 135–145)

## 2020-09-24 LAB — TROPONIN I (HIGH SENSITIVITY)
Troponin I (High Sensitivity): 23 ng/L — ABNORMAL HIGH (ref <18)
Troponin I (High Sensitivity): 23 ng/L — ABNORMAL HIGH (ref <18)

## 2020-09-24 MED ORDER — FAMOTIDINE 20 MG PO TABS: 20.0000 mg | ORAL_TABLET | Freq: Two times a day (BID) | ORAL | 0 refills | Status: DC

## 2020-09-24 NOTE — ED Triage Notes (Signed)
Patient complains of GERD that has worsening and comes intermittently x 3 days. Has been sent from [facility] for further evaluation. Complains of burning to chest

## 2020-09-24 NOTE — ED Notes (Signed)
The pt had chest pain earlier none now

## 2020-09-24 NOTE — Discharge Instructions (Signed)
Your symptoms today may be related to reflux or heartburn.  However, you do have some risk factors for heart disease.  We talked about the importance of following up with a heart doctor.  Please call your cardiologist tomorrow to schedule a follow-up appointment as soon as possible.  If your chest pain gets worse, or you have new symptoms including shortness of breath, lightheadedness, sweating, feel like passing out, please call 911 and return to the ER.
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
## 2020-09-25 ENCOUNTER — Other Ambulatory Visit: Payer: Self-pay | Admitting: Nurse Practitioner

## 2020-09-25 DIAGNOSIS — I10 Essential (primary) hypertension: Secondary | ICD-10-CM

## 2020-09-25 MED ORDER — LISINOPRIL 40 MG PO TABS: 40.0000 mg | ORAL_TABLET | Freq: Every day | ORAL | 0 refills | Status: DC

## 2020-09-25 MED ORDER — AMLODIPINE BESYLATE 10 MG PO TABS: 10.0000 mg | ORAL_TABLET | Freq: Every day | ORAL | 0 refills | Status: DC

## 2020-09-25 MED FILL — FAMOTIDINE 20 MG TABS: 20 | 30 days supply | Qty: 60 | Fill #0

## 2020-09-25 MED FILL — LISINOPRIL 40 MG TAB: 40 | 90 days supply | Qty: 90 | Fill #0

## 2020-09-25 MED FILL — AMLODIPINE BESYLATE 10 MG T: 10 | 90 days supply | Qty: 90 | Fill #0

## 2020-09-25 NOTE — ED Notes (Addendum)
Pt left several hours ago, RN did not d/c form system

## 2020-09-30 ENCOUNTER — Other Ambulatory Visit: Payer: Self-pay

## 2020-10-03 ENCOUNTER — Other Ambulatory Visit: Payer: Self-pay | Admitting: Nurse Practitioner

## 2020-10-03 ENCOUNTER — Encounter: Payer: Self-pay | Admitting: Nurse Practitioner

## 2020-10-03 ENCOUNTER — Ambulatory Visit: Payer: Medicare Other | Attending: Nurse Practitioner | Admitting: Nurse Practitioner

## 2020-10-03 ENCOUNTER — Other Ambulatory Visit: Payer: Self-pay

## 2020-10-03 DIAGNOSIS — K219 Gastro-esophageal reflux disease without esophagitis: Secondary | ICD-10-CM

## 2020-10-03 DIAGNOSIS — I10 Essential (primary) hypertension: Secondary | ICD-10-CM

## 2020-10-03 DIAGNOSIS — E785 Hyperlipidemia, unspecified: Secondary | ICD-10-CM | POA: Diagnosis not present

## 2020-10-03 MED ORDER — OMEPRAZOLE 20 MG PO CPDR
DELAYED_RELEASE_CAPSULE | Freq: Every day | ORAL | 1 refills | Status: DC
  Filled 2020-10-03: qty 90, 90d supply, fill #0
  Filled 2021-01-08: qty 90, 90d supply, fill #1

## 2020-10-03 MED ORDER — ATORVASTATIN CALCIUM 40 MG PO TABS
40.0000 mg | ORAL_TABLET | Freq: Every day | ORAL | 2 refills | Status: DC
Start: 2020-10-03 — End: 2020-12-26
  Filled 2020-10-03: qty 90, 90d supply, fill #0

## 2020-10-03 MED ORDER — CARVEDILOL 12.5 MG PO TABS
12.5000 mg | ORAL_TABLET | Freq: Two times a day (BID) | ORAL | 1 refills | Status: DC
  Filled 2020-10-03: qty 60, 30d supply, fill #0
  Filled 2020-11-10: qty 60, 30d supply, fill #1

## 2020-10-03 NOTE — Addendum Note (Signed)
Addended by: Geryl Rankins on: 10/03/2020 03:18 PM   Modules accepted: Level of Service
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
## 2020-10-04 ENCOUNTER — Other Ambulatory Visit: Payer: Self-pay

## 2020-10-05 ENCOUNTER — Other Ambulatory Visit: Payer: Self-pay
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
## 2020-10-10 ENCOUNTER — Encounter: Payer: Self-pay | Admitting: Physician Assistant

## 2020-10-10 ENCOUNTER — Other Ambulatory Visit: Payer: Self-pay

## 2020-10-10 ENCOUNTER — Ambulatory Visit (INDEPENDENT_AMBULATORY_CARE_PROVIDER_SITE_OTHER): Payer: Medicare Other | Admitting: Physician Assistant

## 2020-10-10 VITALS — BP 140/90 | HR 50 | Ht 66.0 in | Wt 193.0 lb

## 2020-10-10 DIAGNOSIS — I10 Essential (primary) hypertension: Secondary | ICD-10-CM

## 2020-10-10 DIAGNOSIS — R072 Precordial pain: Secondary | ICD-10-CM

## 2020-10-10 DIAGNOSIS — E782 Mixed hyperlipidemia: Secondary | ICD-10-CM | POA: Diagnosis not present

## 2020-10-10 DIAGNOSIS — N182 Chronic kidney disease, stage 2 (mild): Secondary | ICD-10-CM

## 2020-10-10 DIAGNOSIS — K219 Gastro-esophageal reflux disease without esophagitis: Secondary | ICD-10-CM | POA: Diagnosis not present

## 2020-10-10 NOTE — Patient Instructions (Signed)
Medication Instructions:  Your physician recommends that you continue on your current medications as directed. Please refer to the Current Medication list given to you today.  *If you need a refill on your cardiac medications before your next appointment, please call your pharmacy*   Lab Work: -None  If you have labs (blood work) drawn today and your tests are completely normal, you will receive your results only by: Marland Kitchen MyChart Message (if you have MyChart) OR . A paper copy in the mail If you have any lab test that is abnormal or we need to change your treatment, we will call you to review the results.   Testing/Procedures: -None   Follow-Up: At [facility], you and your health needs are our priority.  As part of our continuing mission to provide you with exceptional heart care, we have created designated Provider Care Teams.  These Care Teams include your primary Cardiologist (physician) and Advanced Practice Providers (APPs -  Physician Assistants and Nurse Practitioners) who all work together to provide you with the care you need, when you need it.  We recommend signing up for the patient portal called "MyChart".  Sign up information is provided on this After Visit Summary.  MyChart is used to connect with patients for Virtual Visits (Telemedicine).  Patients are able to view lab/test results, encounter notes, upcoming appointments, etc.  Non-urgent messages can be sent to your provider as well.   To learn more about what you can do with MyChart, go to [email or web address].    Your next appointment:   3 month(s), Wednesday, July 13 @ 2:15 pm.    The format for your next appointment:   In Person  Provider:   Richardson Dopp, PA-C   Other Instructions Keep check on bp.  You have been referred to HTN clinic on Tuesday, May 10 @ 2:00 pm.
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
## 2020-11-07 ENCOUNTER — Ambulatory Visit (INDEPENDENT_AMBULATORY_CARE_PROVIDER_SITE_OTHER): Payer: Medicare Other | Admitting: Pharmacist

## 2020-11-07 ENCOUNTER — Other Ambulatory Visit: Payer: Self-pay

## 2020-11-07 VITALS — BP 144/90 | HR 47

## 2020-11-07 DIAGNOSIS — I10 Essential (primary) hypertension: Secondary | ICD-10-CM

## 2020-11-07 NOTE — Patient Instructions (Addendum)
Please continue amlodipine '10mg'$  daily, carvedilol 12.'5mg'$  twice a day, clonidine 0.'1mg'$  twice a day, lisinopril '40mg'$  daily  Please bring your blood pressure log with you to your next appointment  Hypertension "High blood pressure"  Hypertension is often called "The Silent Killer." It rarely causes symptoms until it is extremely  high or has done damage to other organs in the body. For this reason, you should have your  blood pressure checked regularly by your physician. We will check your blood pressure  every time you see a provider at one of our offices.   Your blood pressure reading consists of two numbers. Ideally, blood pressure should be  below 120/80. The first ("top") number is called the systolic pressure. It measures the  pressure in your arteries as your heart beats. The second ("bottom") number is called the diastolic pressure. It measures the pressure in your arteries as the heart relaxes between beats.  The benefits of getting your blood pressure under control are enormous. A 10-point  reduction in systolic blood pressure can reduce your risk of stroke by 27% and heart failure by 28%  Your blood pressure goal is <130/80  To check your pressure at home you will need to:  1. Sit up in a chair, with feet flat on the floor and back supported. Do not cross your ankles or legs. 2. Rest your left arm so that the cuff is about heart level. If the cuff goes on your upper arm,  then just relax the arm on the table, arm of the chair or your lap. If you have a wrist cuff, we  suggest relaxing your wrist against your chest (think of it as Pledging the Flag with the  wrong arm).  3. Place the cuff snugly around your arm, about 1 inch above the crook of your elbow. The  cords should be inside the groove of your elbow.  4. Sit quietly, with the cuff in place, for about 5 minutes. After that 5 minutes press the power  button to start a reading. 5. Do not talk or move while the reading  is taking place.  6. Record your readings on a sheet of paper. Although most cuffs have a memory, it is often  easier to see a pattern developing when the numbers are all in front of you.  7. You can repeat the reading after 1-3 minutes if it is recommended  Make sure your bladder is empty and you have not had caffeine or tobacco within the last 30 min  Always bring your blood pressure log with you to your appointments. If you have not brought your monitor in to be double checked for accuracy, please bring it to your next appointment.  You can find a list of validated (accurate) blood pressure cuffs at PopPath.it  Healthy Diet  SALT  What is the big deal with sodium? Why the need to limit our intake? What is the connection to  blood pressure? And what is the difference between salt and sodium? Sodium attracts water. Think about it. When you eat an overly salty snack, you tend to become  thirsty and need more water. If you have too much sodium in your bloodstream, your body will  then pull water into the bloodstream as well, trying to correct the imbalance. When you have  more volume in the bloodstream, your blood pressure goes up. Your heart must work harder to  pump the extra volume, and the increase in pressure can wear out blood  vessels faster. You may  also notice bloating and weight gain. Hypertension is one of the leading risk factors for heart  disease. By limiting sodium intake throughout life you are helping decrease your risk of heart  disease later on.   Salt is made up of two minerals. Sodium and chloride. A teaspoon of salt contains about 40%  sodium and 60% chloride. One teaspoon of salt has 2,300 mg of sodium. While the current  USDA guideline states you should consume no more than 2,300 mg per day, both they and the  American Heart Association recommend that you limit this to 1,500 mg to stay healthy. Sea salt  or [place] pink salt may have a slightly different  taste, but they still have almost the same  percent of sodium per teaspoon. So feel free to use them instead of table salt, but don't use  more.  A common myth is that if you don't add salt to your food, you are following a low sodium diet.  However, 75% of the sodium consumed in the American diet is from processed foods, NOT the  salt shaker. We all know that chips and crackers are high in sodium, but there are many other  foods that we may not think of when limiting our sodium. Below are the "salty six" foods that  the American Heart Association wants you to be aware of. 1. Cold cuts - even the healthy sliced [place] can have over 1,000 mg of sodium per slice.   Compare different brands to see which has less sodium if you eat these regularly 2. Pizza - depending on your toppings, a slice of pizza can have up to 760 mg of   sodium. Put more veggies on it or just have a slice with a side salad and still enjoy. 3. Soup - yes even that old home remedy of chicken soup is loaded with sodium. Look   for low sodium versions. Or add a bunch of frozen veggies when heating it, this will   give you less sodium per serving 4. Breads - they may not taste salty, but a single slice of bread can have up to 230 mg of   sodium. Toast for breakfast, a sandwich at lunch and a dinner roll can quickly add up   to over 900 mg in just one day.  5. Chicken - some fresh or frozen chicken is injected with a sodium solution before it   reaches the store. A 4 oz serving should have no more than 100 mg sodium. And   watch for breaded frozen chicken nuggets, strips and tenders. They may seem like a   quick and easy "healthy" meal, but they have high amounts of sodium as well. 6. Burritos/tacos - just 2 teaspoons of taco seasoning can have over 400 mg sodium.   Try making your own with equal parts of cumin, oregano, chili powder and garlic powder.   SUGAR  Sugar is a huge problem in the modern day diet. Sugar is a HUGE  contributor to heart disease, diabetes, high triglyceride levels, fatty liver diease and obesity. Sugar is hidden in almost all packaged foods/beverages. It adds no nutritional benefit to your body and can cause major harm. Added sugar is extra sugar that is added beyond what is naturally found. The American Heart Association recommends limiting added sugars to no more than 25g for women and 36 grams for men per day.  There are many names for sugar maltose, sucrose (names  ending in "ose"), high fructose corn syrup, molasses, cane sugar, corn sweetener, raw sugar, syrup, honey or fruit juice concentrate.   One of the best ways to limit your added sugars is to stop drinking sweetened beverages such as soda, sweet tea, fruit juice or fancy coffee's. There is 65g of added sugars in one 20oz bottle of Coke!! That is equal to 6 donuts.   Pay attention and read all nutrition facts labels. Below is an examples of a nutrition facts label. The #1 is showing you the total sugars where the # 2 is showing you the added sugars. This one severing has almost the max amount of added sugars per day!  Watch out for items that say "low fat" or "no added sugar" as these products are typically very high in sugar. The food industry uses these terms to fool you into thinking they are healthy.  For more information on the dangers of sugar watch WHY Sugar is as Bad as Alcohol (Fructose, The Liver Toxin) on YouTube.    EXERCISE  Exercise can help lower your blood pressure ~5 points systolic (top #) and 8 points diastolic (bottom #)  Exercise is good. We've all heard that. In an ideal world, we would all have time and resources to  get plenty of it. When you are active your heart pumps more efficiently and you will feel better.  Multiple studies show that even walking regularly has benefits that include living a longer life.  The American Heart Association recommends 90-150 minutes per week of exercise (30 minutes  per  day most days of the week). You can do this in any increment you wish. Nine or more  10-minute walks count. So does an hour-long exercise class. Break the time apart into what will  work in your life. Some of the best things you can do include walking briskly, jogging, cycling or  swimming laps. Not everyone is ready to "exercise." Sometimes we need to start with just getting active. Here  are some easy ways to be more active throughout the day: Marland Kitchen Take the stairs instead of the elevator . Go for a 10-15 minute walk during your lunch break (find a friend to make it more enjoyable) . When shopping, park at the back of the parking lot . If you take public transportation, get off one stop early and walk the extra distance . Pace around while making phone calls (most of [place] are not attached to phone cords any longer!) Check with your doctor if you aren't sure what your limitations may be. Always remember to drink plenty of water when doing any type of exercise. Don't feel like a failure if you're not getting the 90-150 minutes per week. If you started by being  a couch potato, then just a 10-minute walk each day is a huge improvement. Start with little  victories and work your way up.   Healthy Eating Tips  When looking to improve your eating habits, whether to lose weight, lower blood pressure or just be healthier, it helps to know what a serving size is.   Grains 1 slice of bread,  bagel,  cup pasta or rice  Vegetables 1 cup fresh or raw vegetables,  cup cooked or canned Fruits 1 piece of medium sized fruit,  cup canned,   Meats/Proteins  cup dried       1 oz meat, 1 egg,  cup cooked beans, nuts or seeds  Dairy  Fats Individual yogurt container, 1 cup (8oz)    1 teaspoon margarine/butter or vegetable  milk or milk alternative, 1 slice of cheese          oil; 1 tablespoon mayonnaise or salad dressing                  Plan ahead: make a menu of the meals for a week then  create a grocery list to go with  that menu. Consider meals that easily stretch into a night of leftovers, such as stews or  casseroles. Or consider making two of your favorite meal and put one in the freezer or fridge for  another night.  When you get home from the grocery store wash and prepare your vegetables and fruits.  Then when you need them they are ready to go.  Tips for going to the grocery store: . Buy store or generic brands . Check the weekly ad from your store on-line or in their in-store flyer . Look at the unit price on the shelf tag to compare/contrast the costs of different items . Buy fruits/vegetables in season . Carrots, bananas and apples are low-cost, naturally healthy items . If meats or frozen vegetables are on sale, buy some extras and put in your freezer . Limit buying prepared or "ready to eat" items, even if they are pre-made salads or fruit snacks . Do not shop when you're hungry . Foods at eye level tend to be more expensive. Look on the high and low shelves for deals. . Consider shopping at the farmer's market for fresh foods in season. . Choose canned tuna or salmon instead of fresh . Avoid the cookie and chip aisles (these are expensive, high in calories and low in  nutritional value) Healthy food preparations: . If you can't get lean hamburger, be sure to drain the fat when cooking . Steam, saut (in olive oil), grill or bake foods . Experiment with different seasonings to avoid adding salt to your foods. Kosher salt, sea salt and [place] salt are all still salt and should be avoided          Aim to have one 12 hour fast each day. This means no eating after dinner until breakfast. For example, if you eat dinner around 6 PM then you would not eat anything until 6 AM the next day. This is a great way to help lower your insulin levels, lose weight and reduce your blood pressure.  Resources: American Heart Association - InstantFinish.fi Go to  the Healthy Living tab to get more information American Diabetes Association - www.diabetes.org You don't have to be diabetic - check out the Food and Fitness tab  DASH diet - https://wilson-eaton.com/ Health topics - or just search on their home page for DASH Quit for Life - www.cancer.org Follow the Stay Healthy tab to learn more about smoking cessation

## 2020-11-10 ENCOUNTER — Encounter: Payer: Self-pay | Admitting: Nurse Practitioner

## 2020-11-10 ENCOUNTER — Other Ambulatory Visit: Payer: Self-pay

## 2020-11-15 ENCOUNTER — Ambulatory Visit: Payer: Medicare Other | Admitting: Nurse Practitioner

## 2020-12-05 ENCOUNTER — Other Ambulatory Visit: Payer: Self-pay

## 2020-12-05 ENCOUNTER — Ambulatory Visit (INDEPENDENT_AMBULATORY_CARE_PROVIDER_SITE_OTHER): Payer: Medicare Other | Admitting: Pharmacist

## 2020-12-05 VITALS — BP 154/98 | HR 51

## 2020-12-05 DIAGNOSIS — I10 Essential (primary) hypertension: Secondary | ICD-10-CM

## 2020-12-05 MED ORDER — CHLORTHALIDONE 25 MG PO TABS
25.0000 mg | ORAL_TABLET | Freq: Every day | ORAL | 11 refills | Status: DC
  Filled 2020-12-05 – 2020-12-12 (×2): qty 30, 30d supply, fill #0
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
## 2020-12-05 NOTE — Patient Instructions (Addendum)
It was nice to see you today  Your blood pressure goal is < 130/32mHg  START taking chlorthalidone '25mg'$  - 1 tablet once daily in the morning  Move your amlodipine medication to the evening  Continue your other medications  Limit your daily sodium intake to < 2,'000mg'$  daily and continue to stay active  Monitor your blood pressure at home/[employer] as able  Follow up for blood pressure check and lab work in 3 weeks

## 2020-12-12 ENCOUNTER — Other Ambulatory Visit: Payer: Self-pay | Admitting: Nurse Practitioner

## 2020-12-12 ENCOUNTER — Other Ambulatory Visit: Payer: Self-pay

## 2020-12-12 DIAGNOSIS — I10 Essential (primary) hypertension: Secondary | ICD-10-CM

## 2020-12-12 MED ORDER — CARVEDILOL 12.5 MG PO TABS
12.5000 mg | ORAL_TABLET | Freq: Two times a day (BID) | ORAL | 0 refills | Status: DC
  Filled 2020-12-12: qty 60, 30d supply, fill #0

## 2020-12-13 ENCOUNTER — Other Ambulatory Visit: Payer: Self-pay

## 2020-12-26 ENCOUNTER — Other Ambulatory Visit: Payer: Self-pay

## 2020-12-26 ENCOUNTER — Ambulatory Visit (INDEPENDENT_AMBULATORY_CARE_PROVIDER_SITE_OTHER): Payer: Medicare Other | Admitting: Pharmacist

## 2020-12-26 VITALS — BP 128/92 | HR 52

## 2020-12-26 DIAGNOSIS — E785 Hyperlipidemia, unspecified: Secondary | ICD-10-CM

## 2020-12-26 DIAGNOSIS — I10 Essential (primary) hypertension: Secondary | ICD-10-CM

## 2020-12-26 MED ORDER — AMLODIPINE BESYLATE 10 MG PO TABS
ORAL_TABLET | Freq: Every day | ORAL | 3 refills | Status: DC
  Filled 2020-12-26: qty 90, 90d supply, fill #0
  Filled 2021-04-02: qty 90, 90d supply, fill #1
  Filled 2021-07-03 – 2021-07-07 (×2): qty 90, 90d supply, fill #2
  Filled 2021-07-09: qty 90, 90d supply, fill #0
  Filled 2021-10-07: qty 90, 90d supply, fill #1

## 2020-12-26 MED ORDER — CARVEDILOL 12.5 MG PO TABS
12.5000 mg | ORAL_TABLET | Freq: Two times a day (BID) | ORAL | 3 refills | Status: DC
  Filled 2020-12-26 – 2021-01-15 (×2): qty 180, 90d supply, fill #0
  Filled 2021-04-10: qty 180, 90d supply, fill #1
  Filled 2021-07-07: qty 180, 90d supply, fill #2
  Filled 2021-07-09: qty 180, 90d supply, fill #0
  Filled 2021-10-07: qty 180, 90d supply, fill #1

## 2020-12-26 MED ORDER — ATORVASTATIN CALCIUM 40 MG PO TABS
40.0000 mg | ORAL_TABLET | Freq: Every day | ORAL | 3 refills | Status: DC
  Filled 2020-12-26: qty 90, 90d supply, fill #0
  Filled 2021-04-02: qty 90, 90d supply, fill #1
  Filled 2021-06-29: qty 90, 90d supply, fill #2
  Filled 2021-09-30: qty 90, 90d supply, fill #0

## 2020-12-26 MED ORDER — LISINOPRIL 40 MG PO TABS
ORAL_TABLET | Freq: Every day | ORAL | 3 refills | Status: DC
  Filled 2020-12-26: qty 90, 90d supply, fill #0
  Filled 2021-04-02: qty 90, 90d supply, fill #1

## 2020-12-26 MED ORDER — CHLORTHALIDONE 25 MG PO TABS
25.0000 mg | ORAL_TABLET | Freq: Every day | ORAL | 3 refills | Status: DC
  Filled 2020-12-26 – 2021-01-07 (×2): qty 90, 90d supply, fill #0
  Filled 2021-04-02: qty 90, 90d supply, fill #1
  Filled 2021-07-03 – 2021-07-07 (×2): qty 90, 90d supply, fill #2
  Filled 2021-07-09: qty 90, 90d supply, fill #0
  Filled 2021-10-07: qty 90, 90d supply, fill #1

## 2020-12-26 MED ORDER — CLONIDINE HCL 0.1 MG PO TABS
ORAL_TABLET | ORAL | 3 refills | Status: DC
  Filled 2020-12-26: qty 180, 90d supply, fill #0
  Filled 2021-04-02: qty 180, 90d supply, fill #1
  Filled 2021-06-29: qty 180, 90d supply, fill #2
  Filled 2021-10-01: qty 180, 90d supply, fill #0
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
## 2020-12-26 NOTE — Patient Instructions (Addendum)
It was nice to see you today!  Your blood pressure goal is < 130/82mHg  You can look for Mrs DDeliah Bostonseasoning. Limit your daily salt intake to < 2,'000mg'$ . Eat more meals at home vs out at restaurants and cut back on frozen meals that are especially high in sodium  Amlodipine '10mg'$  daily - 5pm Carvedilol 12.'5mg'$  twice a day - 3am and 5pm Chlorthalidone '25mg'$  daily - 3am Clonidine 0.'1mg'$  twice a day - 3am and 5pm Lisinopril '40mg'$  daily -  3am  You can move your aspirin, atorvastatin, and multivitamin to the evening if you would like

## 2020-12-27 LAB — BASIC METABOLIC PANEL
BUN/Creatinine Ratio: 16 (ref 10–24)
BUN: 31 mg/dL — ABNORMAL HIGH (ref 8–27)
CO2: 28 mmol/L (ref 20–29)
Calcium: 9.6 mg/dL (ref 8.6–10.2)
Chloride: 96 mmol/L (ref 96–106)
Creatinine, Ser: 1.95 mg/dL — ABNORMAL HIGH (ref 0.76–1.27)
Glucose: 88 mg/dL (ref 65–99)
Potassium: 3.6 mmol/L (ref 3.5–5.2)
Sodium: 141 mmol/L (ref 134–144)
eGFR: 38 mL/min/{1.73_m2} — ABNORMAL LOW (ref >59)

## 2021-01-02 ENCOUNTER — Other Ambulatory Visit: Payer: Self-pay

## 2021-01-03 ENCOUNTER — Ambulatory Visit: Payer: Medicare Other | Attending: Nurse Practitioner | Admitting: Nurse Practitioner

## 2021-01-03 ENCOUNTER — Other Ambulatory Visit: Payer: Self-pay

## 2021-01-03 VITALS — BP 135/83 | HR 57 | Resp 16 | Wt 189.4 lb

## 2021-01-03 DIAGNOSIS — M25511 Pain in right shoulder: Secondary | ICD-10-CM

## 2021-01-03 DIAGNOSIS — G8929 Other chronic pain: Secondary | ICD-10-CM

## 2021-01-03 DIAGNOSIS — I10 Essential (primary) hypertension: Secondary | ICD-10-CM | POA: Diagnosis not present

## 2021-01-03 DIAGNOSIS — R7989 Other specified abnormal findings of blood chemistry: Secondary | ICD-10-CM

## 2021-01-03 MED ORDER — MUPIROCIN CALCIUM 2 % EX CREA
1.0000 "application " | TOPICAL_CREAM | Freq: Two times a day (BID) | CUTANEOUS | 0 refills | Status: DC
  Filled 2021-01-03: qty 30, 15d supply, fill #0
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
## 2021-01-04 ENCOUNTER — Other Ambulatory Visit: Payer: Self-pay | Admitting: Nurse Practitioner

## 2021-01-04 ENCOUNTER — Other Ambulatory Visit: Payer: Self-pay

## 2021-01-04 NOTE — Telephone Encounter (Signed)
Requested medication (s) are due for refill today:   Prescribed yesterday by Geryl Rankins  Requested medication (s) are on the active medication list:   Yes  Future visit scheduled:   Yes   Last ordered: Yesterday 7/6   See pharmacy note Is it ok to switch to the ointment?   If so please send in an Rx.   Thanks.   Requested Prescriptions  Pending Prescriptions Disp Refills   mupirocin cream (BACTROBAN) 2 % 30 g 0    Sig: Apply 1 application topically 2 (two) times daily.      Off-Protocol Failed - 01/04/2021  9:31 AM      Failed - Medication not assigned to a protocol, review manually.      Passed - Valid encounter within last 12 months    Recent Outpatient Visits           Yesterday Essential hypertension   [place], Vernia Buff, NP   3 months ago Poorly-controlled hypertension   [place], Vernia Buff, NP   6 months ago Essential hypertension   [place], Vernia Buff, NP   7 months ago Essential hypertension   [place], Stephen L, RPH-CPP   8 months ago Essential hypertension   [place], Jarome Matin, RPH-CPP       Future Appointments             In 6 days Kath[facility] Mody, Blair Dolphin, PA-C [place], [facility]   In 3 months [place], Vernia Buff, NP [place]

## 2021-01-05 ENCOUNTER — Other Ambulatory Visit: Payer: Self-pay

## 2021-01-05 MED ORDER — MUPIROCIN 2 % EX OINT
1.0000 "application " | TOPICAL_OINTMENT | Freq: Two times a day (BID) | CUTANEOUS | 0 refills | Status: DC
  Filled 2021-01-05: qty 22, 7d supply, fill #0

## 2021-01-06 ENCOUNTER — Encounter: Payer: Self-pay | Admitting: Nurse Practitioner

## 2021-01-06 MED ORDER — MISC. DEVICES MISC: 0 refills | Status: DC

## 2021-01-08 ENCOUNTER — Other Ambulatory Visit: Payer: Self-pay

## 2021-01-09 ENCOUNTER — Other Ambulatory Visit: Payer: Self-pay
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
## 2021-01-10 ENCOUNTER — Other Ambulatory Visit: Payer: Self-pay

## 2021-01-10 ENCOUNTER — Encounter: Payer: Self-pay | Admitting: Physician Assistant

## 2021-01-10 ENCOUNTER — Ambulatory Visit (INDEPENDENT_AMBULATORY_CARE_PROVIDER_SITE_OTHER): Payer: Medicare Other | Admitting: Physician Assistant

## 2021-01-10 VITALS — BP 110/70 | HR 49 | Ht 66.0 in | Wt 190.0 lb

## 2021-01-10 DIAGNOSIS — I700 Atherosclerosis of aorta: Secondary | ICD-10-CM | POA: Diagnosis not present

## 2021-01-10 DIAGNOSIS — I10 Essential (primary) hypertension: Secondary | ICD-10-CM

## 2021-01-10 NOTE — Patient Instructions (Signed)
Medication Instructions:   Your physician recommends that you continue on your current medications as directed. Please refer to the Current Medication list given to you today.  *If you need a refill on your cardiac medications before your next appointment, please call your pharmacy*   Lab Work:  -None  If you have labs (blood work) drawn today and your tests are completely normal, you will receive your results only by: [place] (if you have MyChart) OR A paper copy in the mail If you have any lab test that is abnormal or we need to change your treatment, we will call you to review the results.   Testing/Procedures:  -None   Follow-Up: At [facility], you and your health needs are our priority.  As part of our continuing mission to provide you with exceptional heart care, we have created designated Provider Care Teams.  These Care Teams include your primary Cardiologist (physician) and Advanced Practice Providers (APPs -  Physician Assistants and Nurse Practitioners) who all work together to provide you with the care you need, when you need it.  We recommend signing up for the patient portal called "MyChart".  Sign up information is provided on this After Visit Summary.  MyChart is used to connect with patients for Virtual Visits (Telemedicine).  Patients are able to view lab/test results, encounter notes, upcoming appointments, etc.  Non-urgent messages can be sent to your provider as well.   To learn more about what you can do with MyChart, go to [email or web address].    Your next appointment:   6 month(s)  The format for your next appointment:   In Person  Provider:   You may see Dorris Carnes, MD or one of the following Advanced Practice Providers on your designated Care Team:   Richardson Dopp, PA-C    Other Instructions Your physician wants you to follow-up in: 6 months with Dr.Ross or Richardson Dopp, PA-C.  You will receive a reminder letter in the mail two months  in advance. If you don't receive a letter, please call our office to schedule the follow-up appointment.

## 2021-01-15 ENCOUNTER — Other Ambulatory Visit: Payer: Self-pay

## 2021-01-16 ENCOUNTER — Other Ambulatory Visit: Payer: Self-pay

## 2021-01-17 ENCOUNTER — Other Ambulatory Visit: Payer: Self-pay | Admitting: Nurse Practitioner

## 2021-01-17 ENCOUNTER — Encounter: Payer: Self-pay | Admitting: Nurse Practitioner

## 2021-01-17 DIAGNOSIS — N521 Erectile dysfunction due to diseases classified elsewhere: Secondary | ICD-10-CM

## 2021-01-17 MED ORDER — SILDENAFIL CITRATE 100 MG PO TABS: 50.0000 mg | ORAL_TABLET | Freq: Every day | ORAL | 11 refills | Status: DC | PRN

## 2021-01-18 ENCOUNTER — Telehealth: Payer: Self-pay | Admitting: Nurse Practitioner

## 2021-01-18 NOTE — Telephone Encounter (Signed)
Copied from [place] [id number]. Topic: General - Inquiry >> Jan 18, 2021 11:49 AM Oneta Rack wrote: Levada Dy from [place]  phone # [phone]  fax [phone]    Dr. Margarita Rana faxed over a request for a CPAP machine.  Caller checking on the status of form faxed over on 01/15/2021 requesting clinical notes pior to diagnostic, sleep study results and if this is a replacement c pap clinical notes supporting the replacement. Caller will refax to [phone], please note when received.

## 2021-01-22 ENCOUNTER — Ambulatory Visit (INDEPENDENT_AMBULATORY_CARE_PROVIDER_SITE_OTHER): Payer: Medicare Other | Admitting: Orthopedic Surgery

## 2021-01-22 ENCOUNTER — Ambulatory Visit: Payer: Self-pay

## 2021-01-22 ENCOUNTER — Other Ambulatory Visit: Payer: Self-pay

## 2021-01-22 DIAGNOSIS — M79601 Pain in right arm: Secondary | ICD-10-CM

## 2021-01-23 NOTE — Telephone Encounter (Signed)
Has any paperwork came for this patient.

## 2021-01-24 DIAGNOSIS — G4733 Obstructive sleep apnea (adult) (pediatric): Secondary | ICD-10-CM | POA: Diagnosis not present

## 2021-01-25 ENCOUNTER — Encounter: Payer: Self-pay | Admitting: Nurse Practitioner

## 2021-01-25 ENCOUNTER — Encounter: Payer: Self-pay | Admitting: Orthopedic Surgery
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
## 2021-01-25 NOTE — Telephone Encounter (Signed)
I do not have any paperwork for Mr. Corey Guerrero

## 2021-02-07 ENCOUNTER — Telehealth: Payer: Self-pay | Admitting: Orthopedic Surgery

## 2021-02-07 NOTE — Telephone Encounter (Signed)
Pt called requesting medication to keep him calm for his upcoming MRI appt on 8/19. Please send medication to pharmacy on file. Please call pt when medication has been called in at [phone].

## 2021-02-10 ENCOUNTER — Other Ambulatory Visit: Payer: Self-pay | Admitting: Surgical

## 2021-02-10 MED ORDER — DIAZEPAM 5 MG PO TABS: ORAL_TABLET | ORAL | 0 refills | Status: DC

## 2021-02-10 NOTE — Telephone Encounter (Signed)
Sent in RX for Valium to [facility] on [address]

## 2021-02-12 ENCOUNTER — Encounter: Payer: Self-pay | Admitting: Nurse Practitioner

## 2021-02-14 ENCOUNTER — Telehealth: Payer: Self-pay | Admitting: Cardiology

## 2021-02-14 NOTE — Telephone Encounter (Signed)
What problem are you experiencing? Patient wants to make sure he is wearing his CPAP correctly when he does wear it. He says that he does not wear it like he should but when he does, he's not sure if he is using it correctly.   Who is your medical equipment company? [employer]   Please route to the sleep study assistant.

## 2021-02-16 ENCOUNTER — Ambulatory Visit
Admission: RE | Admit: 2021-02-16 | Discharge: 2021-02-16 | Disposition: A | Discharge status: Home / Self Care | Payer: Medicaid Other | Source: Ambulatory Visit | Attending: Orthopedic Surgery | Admitting: Orthopedic Surgery

## 2021-02-16 DIAGNOSIS — M4802 Spinal stenosis, cervical region: Secondary | ICD-10-CM | POA: Diagnosis not present

## 2021-02-16 DIAGNOSIS — M542 Cervicalgia: Secondary | ICD-10-CM | POA: Diagnosis not present

## 2021-02-16 DIAGNOSIS — M79601 Pain in right arm: Secondary | ICD-10-CM

## 2021-02-21 ENCOUNTER — Ambulatory Visit (INDEPENDENT_AMBULATORY_CARE_PROVIDER_SITE_OTHER): Payer: Medicare Other | Admitting: Orthopedic Surgery

## 2021-02-21 ENCOUNTER — Other Ambulatory Visit: Payer: Self-pay

## 2021-02-21 DIAGNOSIS — M79601 Pain in right arm: Secondary | ICD-10-CM

## 2021-02-21 DIAGNOSIS — M7541 Impingement syndrome of right shoulder: Secondary | ICD-10-CM

## 2021-02-23 ENCOUNTER — Telehealth: Payer: Self-pay | Admitting: Nurse Practitioner

## 2021-02-23 NOTE — Telephone Encounter (Signed)
Samantha from [facility] called and stated that Pt needs an order for orthopedic/diabetic shoes / please advise asap

## 2021-02-24 ENCOUNTER — Encounter: Payer: Self-pay | Admitting: Orthopedic Surgery

## 2021-02-24 DIAGNOSIS — G4733 Obstructive sleep apnea (adult) (pediatric): Secondary | ICD-10-CM | POA: Diagnosis not present
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
## 2021-03-06 ENCOUNTER — Ambulatory Visit: Payer: Self-pay

## 2021-03-06 ENCOUNTER — Ambulatory Visit (INDEPENDENT_AMBULATORY_CARE_PROVIDER_SITE_OTHER): Payer: Medicare Other | Admitting: Physical Medicine and Rehabilitation

## 2021-03-06 ENCOUNTER — Encounter: Payer: Self-pay | Admitting: Physical Medicine and Rehabilitation

## 2021-03-06 ENCOUNTER — Other Ambulatory Visit: Payer: Self-pay

## 2021-03-06 VITALS — BP 165/103 | HR 50

## 2021-03-06 DIAGNOSIS — M5412 Radiculopathy, cervical region: Secondary | ICD-10-CM

## 2021-03-06 MED ORDER — BETAMETHASONE SOD PHOS & ACET 6 (3-3) MG/ML IJ SUSP
12.0000 mg | Freq: Once | INTRAMUSCULAR | Status: AC
Start: 2021-03-06 — End: 2021-03-06
  Administered 2021-03-06: 12 mg

## 2021-03-06 NOTE — Progress Notes (Signed)
Pt state neck pain that travels to his right shoulder.  Pt state any movement with his neck makes the pain worse. Pt state he takes pain patches and over the counter pain meds.  Numeric Pain Rating Scale and Functional Assessment Average Pain 4   In the last MONTH (on 0-10 scale) has pain interfered with the following?  1. General activity like being  able to carry out your everyday physical activities such as walking, climbing stairs, carrying groceries, or moving a chair?  Rating(10)   +Driver, -BT, -Dye Allergies.

## 2021-03-06 NOTE — Patient Instructions (Signed)

## 2021-03-09 NOTE — Telephone Encounter (Signed)
Left message for patient to call back  

## 2021-03-17 NOTE — Procedures (Signed)
Cervical Epidural Steroid Injection - Interlaminar Approach with Fluoroscopic Guidance  Patient: Corey Guerrero      Date of Birth: [birth year] MRN: [id number] PCP: [name], NP      Visit Date: 03/06/2021   Universal Protocol:    Date/Time: 09/17/223:37 PM  Consent Given By: the patient  Position: PRONE  Additional Comments: Vital signs were monitored before and after the procedure. Patient was prepped and draped in the usual sterile fashion. The correct patient, procedure, and site was verified.   Injection Procedure Details:   Procedure diagnoses: Cervical radiculopathy [M54.12]    Meds Administered:  Meds ordered this encounter  Medications   betamethasone acetate-betamethasone sodium phosphate (CELESTONE) injection 12 mg     Laterality: Right  Location/Site: C7-T1  Needle: 3.5 in., 20 ga. Tuohy  Needle Placement: Paramedian epidural space  Findings:  -Comments: Excellent flow of contrast into the epidural space.  Procedure Details: Using a paramedian approach from the side mentioned above, the region overlying the inferior lamina was localized under fluoroscopic visualization and the soft tissues overlying this structure were infiltrated with 4 ml. of 1% Lidocaine without Epinephrine. A # 20 gauge, Tuohy needle was inserted into the epidural space using a paramedian approach.  The epidural space was localized using loss of resistance along with contralateral oblique bi-planar fluoroscopic views.  After negative aspirate for air, blood, and CSF, a 2 ml. volume of Isovue-250 was injected into the epidural space and the flow of contrast was observed. Radiographs were obtained for documentation purposes.   The injectate was administered into the level noted above.  Additional Comments:  No complications occurred Dressing: 2 x 2 sterile gauze and Band-Aid    Post-procedure details: Patient was observed during the procedure. Post-procedure instructions were  reviewed.  Patient left the clinic in stable condition.

## 2021-03-17 NOTE — Progress Notes (Signed)
Corey Guerrero - 65 y.o. male MRN [id number]  Date of birth: 12-Feb-1956  Office Visit Note: Visit Date: 03/06/2021 PCP: [name], NP Referred by: [name], NP  Subjective: Chief Complaint  Patient presents with   Neck - Pain   Right Shoulder - Pain   HPI:  Corey Guerrero is a 65 y.o. male who comes in today at the request of Dr. Anderson Malta for planned Right C7-T1 Cervical Interlaminar epidural steroid injection with fluoroscopic guidance.  The patient has failed conservative care including home exercise, medications, time and activity modification.  This injection will be diagnostic and hopefully therapeutic.  Please see requesting physician notes for further details and justification.  MRI reviewed with images and spine model.  MRI reviewed in the note below.    ROS Otherwise per HPI.  Assessment & Plan: Visit Diagnoses:    ICD-10-CM   1. Cervical radiculopathy  M54.12 XR C-ARM NO REPORT    Epidural Steroid injection    betamethasone acetate-betamethasone sodium phosphate (CELESTONE) injection 12 mg      Plan: No additional findings.   Meds & Orders:  Meds ordered this encounter  Medications   betamethasone acetate-betamethasone sodium phosphate (CELESTONE) injection 12 mg    Orders Placed This Encounter  Procedures   XR C-ARM NO REPORT   Epidural Steroid injection    Follow-up: Return if symptoms worsen or fail to improve.   Procedures: No procedures performed  Cervical Epidural Steroid Injection - Interlaminar Approach with Fluoroscopic Guidance  Patient: Corey Guerrero      Date of Birth: September 23, 1955 MRN: [id number] PCP: [name], NP      Visit Date: 03/06/2021   Universal Protocol:    Date/Time: 09/17/223:37 PM  Consent Given By: the patient  Position: PRONE  Additional Comments: Vital signs were monitored before and after the procedure. Patient was prepped and draped in the usual sterile fashion. The correct patient,  procedure, and site was verified.   Injection Procedure Details:   Procedure diagnoses: Cervical radiculopathy [M54.12]    Meds Administered:  Meds ordered this encounter  Medications   betamethasone acetate-betamethasone sodium phosphate (CELESTONE) injection 12 mg     Laterality: Right  Location/Site: C7-T1  Needle: 3.5 in., 20 ga. Tuohy  Needle Placement: Paramedian epidural space  Findings:  -Comments: Excellent flow of contrast into the epidural space.  Procedure Details: Using a paramedian approach from the side mentioned above, the region overlying the inferior lamina was localized under fluoroscopic visualization and the soft tissues overlying this structure were infiltrated with 4 ml. of 1% Lidocaine without Epinephrine. A # 20 gauge, Tuohy needle was inserted into the epidural space using a paramedian approach.  The epidural space was localized using loss of resistance along with contralateral oblique bi-planar fluoroscopic views.  After negative aspirate for air, blood, and CSF, a 2 ml. volume of Isovue-250 was injected into the epidural space and the flow of contrast was observed. Radiographs were obtained for documentation purposes.   The injectate was administered into the level noted above.  Additional Comments:  No complications occurred Dressing: 2 x 2 sterile gauze and Band-Aid    Post-procedure details: Patient was observed during the procedure. Post-procedure instructions were reviewed.  Patient left the clinic in stable condition.   Clinical History: MRI CERVICAL SPINE WITHOUT CONTRAST   TECHNIQUE: Multiplanar, multisequence MR imaging of the cervical spine was performed. No intravenous contrast was administered.   COMPARISON:  Cervical spine radiograph  01/22/2021.   FINDINGS: Alignment: Straightening, with mildly increased reversal of cervical lordosis from the radiographs last month. There is mild anterolisthesis of C7 on T1 measuring 2-3  mm.   Vertebrae: Faint patchy and degenerative appearing marrow edema in the C4-C5 endplates is eccentric to the right. No other marrow edema or acute osseous abnormality. Underlying chronic degenerative cervical endplate changes. Normal background bone marrow signal.   Cord: No spinal cord signal abnormality despite some degenerative ventral cord mass effect detailed below. But capacious spinal canal at most levels.   Posterior Fossa, vertebral arteries, paraspinal tissues: Cervicomedullary junction is within normal limits. Negative visible posterior fossa. Grossly negative visible other brain parenchyma. Preserved major vascular flow voids in the neck. The right vertebral artery may be slightly dominant. Negative visible neck soft tissues.   Disc levels:   C2-C3: Mild endplate and facet hypertrophy on the right with mild to moderate right C3 foraminal stenosis.   C3-C4: Disc space loss. Circumferential disc osteophyte complex with broad-based posterior component of disc. Effaced ventral CSF space and mildly indented ventral spinal cord but the dorsal CSF space is patent, no significant spinal stenosis. Right greater than left foraminal involvement. Mild to moderate right greater than left C4 foraminal stenosis.   C4-C5: Disc space loss with circumferential disc osteophyte complex. Broad-based central component with mild spinal stenosis and ventral cord mass effect (series 7, image 13). Moderate bilateral C5 foraminal stenosis.   C5-C6: Disc space loss with circumferential disc osteophyte complex eccentric to the right. Broad-based posterior component without spinal stenosis. Mild to moderate left C6 foraminal stenosis.   C6-C7: Disc space loss with circumferential disc osteophyte complex, mostly foraminal and anterior. No spinal stenosis. Moderate to severe left and mild-to-moderate right C7 foraminal stenosis.   C7-T1: Anterolisthesis. Circumferential disc bulge and mild  endplate spurring. Moderate facet and left side ligament flavum hypertrophy. No spinal stenosis. Moderate left and mild right C8 foraminal stenosis.   No visible upper thoracic stenosis.   IMPRESSION: 1. Widespread cervical disc and endplate degeneration, with mild degenerative marrow edema at the C4-C5 level eccentric to the right. Multifactorial mild spinal and moderate bilateral C5 neural foraminal stenosis at that level. 2. No other significant spinal stenosis. Mild spondylolisthesis at C7-T1. Moderate to severe left side C7 neural foraminal stenosis. And up to moderate foraminal stenosis also at the right C3, C4, left C6, left C7, and left C8 nerve levels.     Electronically Signed   By: Genevie Ann M.D.   On: 02/19/2021 09:06     Objective:  VS:  HT:    WT:   BMI:     BP:(!) 165/103  HR:(!) 50bpm  TEMP: ( )  RESP:  Physical Exam Vitals and nursing note reviewed.  Constitutional:      General: He is not in acute distress.    Appearance: Normal appearance. He is not ill-appearing.  HENT:     Head: Normocephalic and atraumatic.     Right Ear: External ear normal.     Left Ear: External ear normal.  Eyes:     Extraocular Movements: Extraocular movements intact.  Cardiovascular:     Rate and Rhythm: Normal rate.     Pulses: Normal pulses.  Abdominal:     General: There is no distension.     Palpations: Abdomen is soft.  Musculoskeletal:        General: No signs of injury.     Cervical back: Neck supple. Tenderness present. No rigidity.  Right lower leg: No edema.     Left lower leg: No edema.     Comments: Patient has good strength in the upper extremities with 5 out of 5 strength in wrist extension long finger flexion APB.  No intrinsic hand muscle atrophy.  Negative Hoffmann's test.  Lymphadenopathy:     Cervical: No cervical adenopathy.  Skin:    Findings: No erythema or rash.  Neurological:     General: No focal deficit present.     Mental Status: He is  alert and oriented to person, place, and time.     Sensory: No sensory deficit.     Motor: No weakness or abnormal muscle tone.     Coordination: Coordination normal.  Psychiatric:        Mood and Affect: Mood normal.        Behavior: Behavior normal.     Imaging: No results found.

## 2021-03-27 DIAGNOSIS — G4733 Obstructive sleep apnea (adult) (pediatric): Secondary | ICD-10-CM | POA: Diagnosis not present

## 2021-03-28 DIAGNOSIS — I129 Hypertensive chronic kidney disease with stage 1 through stage 4 chronic kidney disease, or unspecified chronic kidney disease: Secondary | ICD-10-CM | POA: Diagnosis not present

## 2021-03-28 DIAGNOSIS — N189 Chronic kidney disease, unspecified: Secondary | ICD-10-CM | POA: Diagnosis not present

## 2021-03-28 DIAGNOSIS — D631 Anemia in chronic kidney disease: Secondary | ICD-10-CM | POA: Diagnosis not present

## 2021-03-28 DIAGNOSIS — N2581 Secondary hyperparathyroidism of renal origin: Secondary | ICD-10-CM | POA: Diagnosis not present

## 2021-03-28 DIAGNOSIS — N183 Chronic kidney disease, stage 3 unspecified: Secondary | ICD-10-CM | POA: Diagnosis not present

## 2021-03-30 ENCOUNTER — Other Ambulatory Visit: Payer: Self-pay | Admitting: Nephrology

## 2021-03-30 DIAGNOSIS — N183 Chronic kidney disease, stage 3 unspecified: Secondary | ICD-10-CM

## 2021-03-30 DIAGNOSIS — I129 Hypertensive chronic kidney disease with stage 1 through stage 4 chronic kidney disease, or unspecified chronic kidney disease: Secondary | ICD-10-CM

## 2021-04-02 ENCOUNTER — Other Ambulatory Visit: Payer: Self-pay

## 2021-04-03 ENCOUNTER — Other Ambulatory Visit: Payer: Self-pay

## 2021-04-04 ENCOUNTER — Ambulatory Visit
Admission: RE | Admit: 2021-04-04 | Discharge: 2021-04-04 | Disposition: A | Discharge status: Home / Self Care | Payer: Medicare Other | Source: Ambulatory Visit | Attending: Nephrology | Admitting: Nephrology

## 2021-04-04 DIAGNOSIS — N183 Chronic kidney disease, stage 3 unspecified: Secondary | ICD-10-CM

## 2021-04-04 DIAGNOSIS — I10 Essential (primary) hypertension: Secondary | ICD-10-CM | POA: Diagnosis not present

## 2021-04-04 DIAGNOSIS — N3289 Other specified disorders of bladder: Secondary | ICD-10-CM | POA: Diagnosis not present

## 2021-04-04 DIAGNOSIS — N281 Cyst of kidney, acquired: Secondary | ICD-10-CM | POA: Diagnosis not present

## 2021-04-04 DIAGNOSIS — I129 Hypertensive chronic kidney disease with stage 1 through stage 4 chronic kidney disease, or unspecified chronic kidney disease: Secondary | ICD-10-CM

## 2021-04-06 ENCOUNTER — Other Ambulatory Visit: Payer: Self-pay

## 2021-04-06 ENCOUNTER — Ambulatory Visit: Payer: Medicare Other | Attending: Nurse Practitioner | Admitting: Nurse Practitioner

## 2021-04-06 ENCOUNTER — Encounter: Payer: Self-pay | Admitting: Nurse Practitioner

## 2021-04-06 VITALS — BP 110/76 | HR 45 | Ht 66.0 in | Wt 191.5 lb

## 2021-04-06 DIAGNOSIS — Z0001 Encounter for general adult medical examination with abnormal findings: Secondary | ICD-10-CM | POA: Diagnosis not present

## 2021-04-06 DIAGNOSIS — K219 Gastro-esophageal reflux disease without esophagitis: Secondary | ICD-10-CM | POA: Diagnosis not present

## 2021-04-06 DIAGNOSIS — R7303 Prediabetes: Secondary | ICD-10-CM

## 2021-04-06 DIAGNOSIS — E785 Hyperlipidemia, unspecified: Secondary | ICD-10-CM

## 2021-04-06 DIAGNOSIS — Z0000 Encounter for general adult medical examination without abnormal findings: Secondary | ICD-10-CM

## 2021-04-06 MED ORDER — OMEPRAZOLE 20 MG PO CPDR
DELAYED_RELEASE_CAPSULE | Freq: Every day | ORAL | 1 refills | Status: DC
  Filled 2021-04-06: qty 90, 90d supply, fill #0
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
## 2021-04-07 LAB — CMP14+EGFR
ALT: 32 IU/L (ref 0–44)
AST: 41 IU/L — ABNORMAL HIGH (ref 0–40)
Albumin/Globulin Ratio: 1.6 (ref 1.2–2.2)
Albumin: 4.5 g/dL (ref 3.8–4.8)
Alkaline Phosphatase: 60 IU/L (ref 44–121)
BUN/Creatinine Ratio: 17 (ref 10–24)
BUN: 39 mg/dL — ABNORMAL HIGH (ref 8–27)
Bilirubin Total: 0.8 mg/dL (ref 0.0–1.2)
CO2: 26 mmol/L (ref 20–29)
Calcium: 9.6 mg/dL (ref 8.6–10.2)
Chloride: 95 mmol/L — ABNORMAL LOW (ref 96–106)
Creatinine, Ser: 2.24 mg/dL — ABNORMAL HIGH (ref 0.76–1.27)
Globulin, Total: 2.8 g/dL (ref 1.5–4.5)
Glucose: 95 mg/dL (ref 70–99)
Potassium: 3.6 mmol/L (ref 3.5–5.2)
Sodium: 139 mmol/L (ref 134–144)
Total Protein: 7.3 g/dL (ref 6.0–8.5)
eGFR: 32 mL/min/{1.73_m2} — ABNORMAL LOW (ref >59)

## 2021-04-07 LAB — HEMOGLOBIN A1C
Est. average glucose Bld gHb Est-mCnc: 128 mg/dL
Hgb A1c MFr Bld: 6.1 % — ABNORMAL HIGH (ref 4.8–5.6)

## 2021-04-07 LAB — LIPID PANEL
Chol/HDL Ratio: 3.1 ratio (ref 0.0–5.0)
Cholesterol, Total: 102 mg/dL (ref 100–199)
HDL: 33 mg/dL — ABNORMAL LOW (ref >39)
LDL Chol Calc (NIH): 43 mg/dL (ref 0–99)
Triglycerides: 152 mg/dL — ABNORMAL HIGH (ref 0–149)
VLDL Cholesterol Cal: 26 mg/dL (ref 5–40)

## 2021-04-08 ENCOUNTER — Encounter: Payer: Self-pay | Admitting: Nurse Practitioner

## 2021-04-09 ENCOUNTER — Other Ambulatory Visit: Payer: Self-pay | Admitting: Nurse Practitioner

## 2021-04-09 ENCOUNTER — Encounter: Payer: Self-pay | Admitting: Nurse Practitioner

## 2021-04-09 DIAGNOSIS — I10 Essential (primary) hypertension: Secondary | ICD-10-CM

## 2021-04-09 MED ORDER — LISINOPRIL 20 MG PO TABS
20.0000 mg | ORAL_TABLET | Freq: Every day | ORAL | 0 refills | Status: DC
  Filled 2021-04-09: qty 30, 30d supply, fill #0

## 2021-04-10 ENCOUNTER — Other Ambulatory Visit: Payer: Self-pay

## 2021-04-17 NOTE — Telephone Encounter (Signed)
Patient was noncompliant on his cpap using it only 1% in 90 days and his dme picked up his unit for non compliance. He has an appointment set for 10/14 with the sleep doctor and he will want to get restarted with a sleep study at that visit.

## 2021-04-19 ENCOUNTER — Encounter: Payer: Self-pay | Admitting: Nurse Practitioner

## 2021-04-23 ENCOUNTER — Encounter: Payer: Self-pay | Admitting: Cardiology

## 2021-04-23 ENCOUNTER — Encounter: Payer: Medicare Other | Admitting: Cardiology

## 2021-04-23 ENCOUNTER — Other Ambulatory Visit: Payer: Self-pay

## 2021-04-23 DIAGNOSIS — G4733 Obstructive sleep apnea (adult) (pediatric): Secondary | ICD-10-CM
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
## 2021-04-23 NOTE — Addendum Note (Signed)
Addended by: Antonieta Iba on: 04/23/2021 10:00 AM   Modules accepted: Orders

## 2021-04-23 NOTE — Progress Notes (Deleted)
This encounter was created in error - please disregard.

## 2021-04-23 NOTE — Patient Instructions (Signed)
Medication Instructions:  Your physician recommends that you continue on your current medications as directed. Please refer to the Current Medication list given to you today.  *If you need a refill on your cardiac medications before your next appointment, please call your pharmacy*    Follow-Up: At [facility], you and your health needs are our priority.  As part of our continuing mission to provide you with exceptional heart care, we have created designated Provider Care Teams.  These Care Teams include your primary Cardiologist (physician) and Advanced Practice Providers (APPs -  Physician Assistants and Nurse Practitioners) who all work together to provide you with the care you need, when you need it.  Your next appointment:   2 month(s)  The format for your next appointment:   In Person  Provider:   Fransico Him, MD   Other Instructions You have been referred to an Ear, Nose and Throat Specialist for a consultation for an Inspire device.

## 2021-04-23 NOTE — Addendum Note (Signed)
Addended by: Sueanne Margarita on: 04/23/2021 09:38 AM   Modules accepted: Level of Service

## 2021-05-07 ENCOUNTER — Other Ambulatory Visit: Payer: Self-pay

## 2021-05-07 ENCOUNTER — Other Ambulatory Visit: Payer: Self-pay | Admitting: Nurse Practitioner

## 2021-05-07 DIAGNOSIS — I10 Essential (primary) hypertension: Secondary | ICD-10-CM

## 2021-05-07 MED ORDER — LISINOPRIL 20 MG PO TABS
20.0000 mg | ORAL_TABLET | Freq: Every day | ORAL | 1 refills | Status: DC
  Filled 2021-05-07 – 2021-08-07 (×3): qty 90, 90d supply, fill #0
  Filled 2021-08-07: qty 90, 90d supply, fill #1

## 2021-05-07 NOTE — Telephone Encounter (Signed)
Requested Prescriptions  Pending Prescriptions Disp Refills  . lisinopril (ZESTRIL) 20 MG tablet 90 tablet 1    Sig: Take 1 tablet (20 mg total) by mouth daily.     Cardiovascular:  ACE Inhibitors Failed - 05/07/2021 12:31 AM      Failed - Cr in normal range and within 180 days    Creat  Date Value Ref Range Status  07/02/2016 1.41 (H) 0.70 - 1.33 mg/dL Final    Comment:      For patients > or = 64 years of age: The upper reference limit for Creatinine is approximately 13% higher for people identified as African-American.      Creatinine, Ser  Date Value Ref Range Status  04/06/2021 2.24 (H) 0.76 - 1.27 mg/dL Final   Creatinine, Urine  Date Value Ref Range Status  05/22/2011 80.8 mg/dL Final         Passed - K in normal range and within 180 days    Potassium  Date Value Ref Range Status  04/06/2021 3.6 3.5 - 5.2 mmol/L Final         Passed - Patient is not pregnant      Passed - Last BP in normal range    BP Readings from Last 1 Encounters:  04/23/21 110/71         Passed - Valid encounter within last 6 months    Recent Outpatient Visits          1 month ago Encounter for annual physical exam   [place], Vernia Buff, NP   4 months ago Essential hypertension   [place], Vernia Buff, NP   7 months ago Poorly-controlled hypertension   [place], Vernia Buff, NP   10 months ago Essential hypertension   [place], Vernia Buff, NP   11 months ago Essential hypertension   [place], RPH-CPP

## 2021-05-14 ENCOUNTER — Other Ambulatory Visit: Payer: Self-pay

## 2021-06-29 ENCOUNTER — Other Ambulatory Visit: Payer: Self-pay

## 2021-07-02 ENCOUNTER — Other Ambulatory Visit: Payer: Self-pay

## 2021-07-03 ENCOUNTER — Other Ambulatory Visit: Payer: Self-pay

## 2021-07-04 ENCOUNTER — Other Ambulatory Visit: Payer: Self-pay

## 2021-07-07 ENCOUNTER — Other Ambulatory Visit: Payer: Self-pay

## 2021-07-09 ENCOUNTER — Other Ambulatory Visit: Payer: Self-pay

## 2021-07-10 ENCOUNTER — Encounter: Payer: Self-pay | Admitting: Nurse Practitioner

## 2021-07-10 ENCOUNTER — Other Ambulatory Visit: Payer: Self-pay

## 2021-07-10 ENCOUNTER — Other Ambulatory Visit: Payer: Self-pay | Admitting: Nurse Practitioner

## 2021-07-10 DIAGNOSIS — K219 Gastro-esophageal reflux disease without esophagitis: Secondary | ICD-10-CM

## 2021-07-10 MED ORDER — OMEPRAZOLE 20 MG PO CPDR
DELAYED_RELEASE_CAPSULE | Freq: Every day | ORAL | 1 refills | Status: DC
  Filled 2021-07-10: qty 90, 90d supply, fill #0
  Filled 2021-10-20: qty 90, 90d supply, fill #1

## 2021-07-12 ENCOUNTER — Encounter: Payer: Self-pay | Admitting: Nurse Practitioner

## 2021-07-13 ENCOUNTER — Other Ambulatory Visit: Payer: Self-pay | Admitting: Nurse Practitioner

## 2021-07-13 ENCOUNTER — Emergency Department (HOSPITAL_COMMUNITY)
Admission: EM | Admit: 2021-07-13 | Discharge: 2021-07-13 | Disposition: A | Discharge status: Home / Self Care | Payer: Medicare Other | Attending: Emergency Medicine | Admitting: Emergency Medicine

## 2021-07-13 ENCOUNTER — Encounter (HOSPITAL_COMMUNITY): Payer: Self-pay

## 2021-07-13 ENCOUNTER — Other Ambulatory Visit: Payer: Self-pay

## 2021-07-13 ENCOUNTER — Emergency Department (HOSPITAL_COMMUNITY): Payer: Medicare Other

## 2021-07-13 DIAGNOSIS — R0789 Other chest pain: Secondary | ICD-10-CM | POA: Insufficient documentation

## 2021-07-13 DIAGNOSIS — R079 Chest pain, unspecified: Secondary | ICD-10-CM | POA: Diagnosis not present

## 2021-07-13 DIAGNOSIS — Z5321 Procedure and treatment not carried out due to patient leaving prior to being seen by health care provider: Secondary | ICD-10-CM | POA: Insufficient documentation

## 2021-07-13 DIAGNOSIS — R110 Nausea: Secondary | ICD-10-CM | POA: Diagnosis not present

## 2021-07-13 LAB — BASIC METABOLIC PANEL
Anion gap: 9 (ref 5–15)
BUN: 34 mg/dL — ABNORMAL HIGH (ref 8–23)
CO2: 26 mmol/L (ref 22–32)
Calcium: 9.4 mg/dL (ref 8.9–10.3)
Chloride: 100 mmol/L (ref 98–111)
Creatinine, Ser: 1.86 mg/dL — ABNORMAL HIGH (ref 0.61–1.24)
GFR, Estimated: 40 mL/min — ABNORMAL LOW (ref >60)
Glucose, Bld: 111 mg/dL — ABNORMAL HIGH (ref 70–99)
Potassium: 3.4 mmol/L — ABNORMAL LOW (ref 3.5–5.1)
Sodium: 135 mmol/L (ref 135–145)

## 2021-07-13 LAB — CBC
HCT: 46.1 % (ref 39.0–52.0)
Hemoglobin: 15.9 g/dL (ref 13.0–17.0)
MCH: 31.6 pg (ref 26.0–34.0)
MCHC: 34.5 g/dL (ref 30.0–36.0)
MCV: 91.7 fL (ref 80.0–100.0)
Platelets: 190 10*3/uL (ref 150–400)
RBC: 5.03 MIL/uL (ref 4.22–5.81)
RDW: 12.7 % (ref 11.5–15.5)
WBC: 5.8 10*3/uL (ref 4.0–10.5)
nRBC: 0 % (ref 0.0–0.2)

## 2021-07-13 LAB — TROPONIN I (HIGH SENSITIVITY): Troponin I (High Sensitivity): 23 ng/L — ABNORMAL HIGH (ref <18)

## 2021-07-13 MED ORDER — BUSPIRONE HCL 15 MG PO TABS
15.0000 mg | ORAL_TABLET | Freq: Two times a day (BID) | ORAL | 1 refills | Status: AC
  Filled 2021-07-13: qty 60, 30d supply, fill #0

## 2021-07-13 NOTE — ED Triage Notes (Addendum)
Patient c/o intermittent mid chest pain x 2 days. Patient states that he has a history of anxiety and GERD. Patient denies any SOB or N/V.

## 2021-07-13 NOTE — ED Provider Triage Note (Signed)
Emergency Medicine Provider Triage Evaluation Note  Corey Guerrero , a 66 y.o. male  was evaluated in triage.  Pt complains of present emergency department with chest pressure.  He has a history of reflux, feels that this may be similar to that, but his symptoms been ongoing since yesterday.  They did get better with Pepcid.  He denies any known coronary history.  He just wanted to come "get my heart checked out".  Review of Systems  Positive: Nausea Negative; no vomiting, no diarrhea, no shortness of breath  Physical Exam  BP (!) 165/103 (BP Location: Left Arm)    Pulse (!) 51    Temp 98.1 F (36.7 C) (Oral)    Resp 16    Ht 5\' 6"  (1.676 m)    Wt 84.8 kg    SpO2 98%    BMI 30.18 kg/m  Gen:   Awake, no distress   Resp:  Normal effort  MSK:   Moves extremities without difficulty  Other:  No abdominal tenderness or guarding  Medical Decision Making  Medically screening exam initiated at 9:20 AM.  Appropriate orders placed.  [name] was informed that the remainder of the evaluation will be completed by another provider, this initial triage assessment does not replace that evaluation, and the importance of remaining in the ED until their evaluation is complete.  Chest pain and/or reflux evaluation   Wyvonnia Dusky, MD 07/13/21 [phone]

## 2021-07-16 ENCOUNTER — Other Ambulatory Visit: Payer: Self-pay

## 2021-07-20 ENCOUNTER — Encounter: Payer: Self-pay | Admitting: Nurse Practitioner

## 2021-08-02 ENCOUNTER — Encounter: Payer: Self-pay | Admitting: Nurse Practitioner

## 2021-08-06 DIAGNOSIS — G4733 Obstructive sleep apnea (adult) (pediatric): Secondary | ICD-10-CM | POA: Diagnosis not present

## 2021-08-07 ENCOUNTER — Other Ambulatory Visit: Payer: Self-pay

## 2021-08-13 ENCOUNTER — Other Ambulatory Visit: Payer: Self-pay

## 2021-08-22 ENCOUNTER — Ambulatory Visit: Payer: Self-pay | Admitting: *Deleted

## 2021-08-22 ENCOUNTER — Telehealth: Payer: Self-pay

## 2021-08-22 DIAGNOSIS — M79671 Pain in right foot: Secondary | ICD-10-CM | POA: Insufficient documentation

## 2021-08-22 DIAGNOSIS — S93601A Unspecified sprain of right foot, initial encounter: Secondary | ICD-10-CM | POA: Diagnosis not present

## 2021-08-22 DIAGNOSIS — S93401A Sprain of unspecified ligament of right ankle, initial encounter: Secondary | ICD-10-CM | POA: Diagnosis not present

## 2021-08-22 NOTE — Telephone Encounter (Signed)
Called pt and gave him options to do virtual or [place]. Pt stated he is going to go to [facility].

## 2021-08-22 NOTE — Telephone Encounter (Signed)
Pt states pulled or strained something in right ankle, in pain and hurts really bad almost slipped and fell, no appt available/ any till 3/16  [phone]. FU with pt to advise      Chief Complaint: Right ankle and foot pain Symptoms: Pain 8/10, limping. Swelling. Right ankle and top of foot, twisted s/p near fall Frequency: 3 days ago Pertinent Negatives: Patient denies lacerations Disposition: [] ED /[x] Urgent Care (no appt availability in office) / [] Appointment(In office/virtual)/ []  [facility]/ [] Home Care/ [] Refused Recommended Disposition /[] [facility]/ []  Follow-up with PCP Additional Notes:  Reason for Disposition  [1] Limp when walking AND [2] due to a twisted ankle or foot  Answer Assessment - Initial Assessment Questions 1. MECHANISM: "How did the injury happen?" (e.g., twisting injury, direct blow)      3 days ago 2. ONSET: "When did the injury happen?" (Minutes or hours ago)      Slipped and twisted ankle 3. LOCATION: "Where is the injury located?"      Right ankle and foot 4. APPEARANCE of INJURY: "What does the injury look like?"      Slight swelling 5. WEIGHT-BEARING: "Can you put weight on that foot?" "Can you walk (four steps or more)?"       CAn walk but painful 6. SIZE: For cuts, bruises, or swelling, ask: "How large is it?" (e.g., inches or centimeters;  entire joint)      *No Answer* 7. PAIN: "Is there pain?" If Yes, ask: "How bad is the pain?"    (e.g., Scale 1-10; or mild, moderate, severe)   - NONE (0): no pain.   - MILD (1-3): doesn't interfere with normal activities.    - MODERATE (4-7): interferes with normal activities (e.g., work or school) or awakens from sleep, limping.    - SEVERE (8-10): excruciating pain, unable to do any normal activities, unable to walk.      7/10 8. TETANUS: For any breaks in the skin, ask: "When was the last tetanus booster?"     NA 9. OTHER SYMPTOMS: "Do you have any other symptoms?"       no  Protocols used: Ankle and Foot Injury-A-AH

## 2021-08-31 DIAGNOSIS — M79671 Pain in right foot: Secondary | ICD-10-CM | POA: Diagnosis not present

## 2021-09-01 ENCOUNTER — Other Ambulatory Visit: Payer: Self-pay | Admitting: Otolaryngology

## 2021-09-03 ENCOUNTER — Encounter (HOSPITAL_BASED_OUTPATIENT_CLINIC_OR_DEPARTMENT_OTHER): Payer: Self-pay | Admitting: Otolaryngology

## 2021-09-04 ENCOUNTER — Other Ambulatory Visit: Payer: Self-pay | Admitting: Otolaryngology

## 2021-09-04 DIAGNOSIS — S56919A Strain of unspecified muscles, fascia and tendons at forearm level, unspecified arm, initial encounter: Secondary | ICD-10-CM | POA: Diagnosis not present

## 2021-09-06 ENCOUNTER — Encounter (HOSPITAL_BASED_OUTPATIENT_CLINIC_OR_DEPARTMENT_OTHER)
Admission: RE | Admit: 2021-09-06 | Discharge: 2021-09-06 | Disposition: A | Discharge status: Home / Self Care | Payer: Medicare Other | Source: Ambulatory Visit | Attending: Otolaryngology | Admitting: Otolaryngology

## 2021-09-06 DIAGNOSIS — Z01812 Encounter for preprocedural laboratory examination: Secondary | ICD-10-CM | POA: Insufficient documentation

## 2021-09-06 LAB — BASIC METABOLIC PANEL
Anion gap: 10 (ref 5–15)
BUN: 33 mg/dL — ABNORMAL HIGH (ref 8–23)
CO2: 28 mmol/L (ref 22–32)
Calcium: 8.5 mg/dL — ABNORMAL LOW (ref 8.9–10.3)
Chloride: 102 mmol/L (ref 98–111)
Creatinine, Ser: 2.22 mg/dL — ABNORMAL HIGH (ref 0.61–1.24)
GFR, Estimated: 32 mL/min — ABNORMAL LOW (ref >60)
Glucose, Bld: 88 mg/dL (ref 70–99)
Potassium: 3.5 mmol/L (ref 3.5–5.1)
Sodium: 140 mmol/L (ref 135–145)

## 2021-09-07 DIAGNOSIS — S46319D Strain of muscle, fascia and tendon of triceps, unspecified arm, subsequent encounter: Secondary | ICD-10-CM | POA: Diagnosis not present

## 2021-09-07 DIAGNOSIS — T1490XA Injury, unspecified, initial encounter: Secondary | ICD-10-CM | POA: Diagnosis not present

## 2021-09-07 DIAGNOSIS — S46312D Strain of muscle, fascia and tendon of triceps, left arm, subsequent encounter: Secondary | ICD-10-CM | POA: Diagnosis not present

## 2021-09-10 ENCOUNTER — Other Ambulatory Visit: Payer: Self-pay

## 2021-09-10 ENCOUNTER — Other Ambulatory Visit: Payer: Self-pay | Admitting: Sports Medicine

## 2021-09-10 DIAGNOSIS — S46312A Strain of muscle, fascia and tendon of triceps, left arm, initial encounter: Secondary | ICD-10-CM | POA: Diagnosis not present

## 2021-09-10 DIAGNOSIS — M25522 Pain in left elbow: Secondary | ICD-10-CM

## 2021-09-10 MED ORDER — DIAZEPAM 10 MG PO TABS
ORAL_TABLET | ORAL | 0 refills | Status: DC
  Filled 2021-09-10: qty 2, 1d supply, fill #0

## 2021-09-10 NOTE — Anesthesia Preprocedure Evaluation (Addendum)
Anesthesia Evaluation  Patient identified by MRN, date of birth, ID band Patient awake    Reviewed: Allergy & Precautions, NPO status , Patient's Chart, lab work & pertinent test results  Airway Mallampati: II  TM Distance: >3 FB Neck ROM: Full    Dental no notable dental hx. (+) Partial Upper, Partial Lower, Dental Advisory Given   Pulmonary sleep apnea ,    Pulmonary exam normal breath sounds clear to auscultation       Cardiovascular hypertension, Pt. on medications Normal cardiovascular exam Rhythm:Regular Rate:Normal  07/16/21 EKG SB R 49   Neuro/Psych  Headaches,  Neuromuscular disease    GI/Hepatic GERD  ,  Endo/Other    Renal/GU Renal InsufficiencyRenal disease     Musculoskeletal   Abdominal (+) + obese (BMI 31.49),   Peds  Hematology   Anesthesia Other Findings ALL: trazadone  Reproductive/Obstetrics                            Anesthesia Physical Anesthesia Plan  ASA: 3  Anesthesia Plan: MAC   Post-op Pain Management:    Induction:   PONV Risk Score and Plan: 2 and Treatment may vary due to age or medical condition, Midazolam and Ondansetron  Airway Management Planned: Natural Airway and Nasal Cannula  Additional Equipment: None  Intra-op Plan:   Post-operative Plan:   Informed Consent: I have reviewed the patients History and Physical, chart, labs and discussed the procedure including the risks, benefits and alternatives for the proposed anesthesia with the patient or authorized representative who has indicated his/her understanding and acceptance.     Dental advisory given  Plan Discussed with: CRNA  Anesthesia Plan Comments:        Anesthesia Quick Evaluation

## 2021-09-11 ENCOUNTER — Ambulatory Visit (HOSPITAL_BASED_OUTPATIENT_CLINIC_OR_DEPARTMENT_OTHER)
Admission: RE | Admit: 2021-09-11 | Discharge: 2021-09-11 | Disposition: A | Discharge status: Home / Self Care | Payer: Medicare Other | Attending: Otolaryngology | Admitting: Otolaryngology

## 2021-09-11 ENCOUNTER — Other Ambulatory Visit: Payer: Self-pay

## 2021-09-11 ENCOUNTER — Ambulatory Visit (HOSPITAL_BASED_OUTPATIENT_CLINIC_OR_DEPARTMENT_OTHER): Payer: Medicare Other | Admitting: Anesthesiology

## 2021-09-11 ENCOUNTER — Encounter (HOSPITAL_BASED_OUTPATIENT_CLINIC_OR_DEPARTMENT_OTHER): Payer: Self-pay | Admitting: Otolaryngology

## 2021-09-11 ENCOUNTER — Encounter (HOSPITAL_BASED_OUTPATIENT_CLINIC_OR_DEPARTMENT_OTHER)
Admission: RE | Disposition: A | Discharge status: Home / Self Care | Payer: Self-pay | Source: Home / Self Care | Attending: Otolaryngology

## 2021-09-11 DIAGNOSIS — G4733 Obstructive sleep apnea (adult) (pediatric): Secondary | ICD-10-CM | POA: Insufficient documentation

## 2021-09-11 DIAGNOSIS — Z6839 Body mass index (BMI) 39.0-39.9, adult: Secondary | ICD-10-CM | POA: Insufficient documentation

## 2021-09-11 DIAGNOSIS — I10 Essential (primary) hypertension: Secondary | ICD-10-CM

## 2021-09-11 DIAGNOSIS — K219 Gastro-esophageal reflux disease without esophagitis: Secondary | ICD-10-CM | POA: Insufficient documentation

## 2021-09-11 DIAGNOSIS — N289 Disorder of kidney and ureter, unspecified: Secondary | ICD-10-CM

## 2021-09-11 DIAGNOSIS — E669 Obesity, unspecified: Secondary | ICD-10-CM | POA: Diagnosis not present

## 2021-09-11 HISTORY — PX: DRUG INDUCED ENDOSCOPY: SHX6808

## 2021-09-11 SURGERY — DRUG INDUCED SLEEP ENDOSCOPY
Anesthesia: Monitor Anesthesia Care

## 2021-09-11 MED ORDER — OXYMETAZOLINE HCL 0.05 % NA SOLN
NASAL | Status: DC | PRN
  Administered 2021-09-11: 1

## 2021-09-11 MED ORDER — ACETAMINOPHEN 10 MG/ML IV SOLN: 1000.0000 mg | Freq: Once | INTRAVENOUS | Status: DC | PRN

## 2021-09-11 MED ORDER — LIDOCAINE 2% (20 MG/ML) 5 ML SYRINGE
INTRAMUSCULAR | Status: DC | PRN
  Administered 2021-09-11: 60 mg via INTRAVENOUS

## 2021-09-11 MED ORDER — LACTATED RINGERS IV SOLN: INTRAVENOUS | Status: DC

## 2021-09-11 MED ORDER — ONDANSETRON HCL 4 MG/2ML IJ SOLN: 4.0000 mg | Freq: Once | INTRAMUSCULAR | Status: DC | PRN

## 2021-09-11 MED ORDER — PROPOFOL 500 MG/50ML IV EMUL
INTRAVENOUS | Status: DC | PRN
  Administered 2021-09-11: 35 ug/kg/min via INTRAVENOUS

## 2021-09-11 MED ORDER — FENTANYL CITRATE (PF) 100 MCG/2ML IJ SOLN: 25.0000 ug | INTRAMUSCULAR | Status: DC | PRN

## 2021-09-11 MED ORDER — LIDOCAINE-EPINEPHRINE 1 %-1:100000 IJ SOLN
INTRAMUSCULAR | Status: AC
  Filled 2021-09-11: qty 1

## 2021-09-11 MED ORDER — OXYMETAZOLINE HCL 0.05 % NA SOLN
NASAL | Status: AC
  Filled 2021-09-11: qty 30

## 2021-09-11 SURGICAL SUPPLY — 14 items
CANISTER SUCT 1200ML W/VALVE (MISCELLANEOUS) ×2 IMPLANT
GLOVE SURG ENC TEXT LTX SZ7 (GLOVE) ×2 IMPLANT
KIT CLEAN ENDO (MISCELLANEOUS) ×2 IMPLANT
NDL HYPO 27GX1-1/4 (NEEDLE) IMPLANT
NEEDLE HYPO 27GX1-1/4 (NEEDLE) IMPLANT
PACK BASIN DAY SURGERY FS (CUSTOM PROCEDURE TRAY) ×2 IMPLANT
PATTIES SURGICAL .5 X3 (DISPOSABLE) IMPLANT
SHEET MEDIUM DRAPE 40X70 STRL (DRAPES) ×2 IMPLANT
SOL ANTI FOG 6CC (MISCELLANEOUS) ×1 IMPLANT
SOLUTION ANTI FOG 6CC (MISCELLANEOUS) ×1
SPONGE NEURO XRAY DETECT 1X3 (DISPOSABLE) IMPLANT
SYR CONTROL 10ML LL (SYRINGE) IMPLANT
TOWEL GREEN STERILE FF (TOWEL DISPOSABLE) ×2 IMPLANT
TUBE CONNECTING 20X1/4 (TUBING) IMPLANT

## 2021-09-11 NOTE — Discharge Instructions (Signed)

## 2021-09-11 NOTE — Transfer of Care (Signed)
Immediate Anesthesia Transfer of Care Note ? ?Patient: Corey Guerrero ? ?Procedure(s) Performed: DRUG INDUCED SLEEP ENDOSCOPY ? ?Patient Location: PACU ? ?Anesthesia Type:MAC ? ?Level of Consciousness: awake, alert , oriented and patient cooperative ? ?Airway & Oxygen Therapy: Patient Spontanous Breathing ? ?Post-op Assessment: Report given to RN and Post -op Vital signs reviewed and stable ? ?Post vital signs: Reviewed and stable ? ?Last Vitals:  ?Vitals Value Taken Time  ?BP    ?Temp    ?Pulse 49 09/11/21 1215  ?Resp    ?SpO2 95 % 09/11/21 1215  ?Vitals shown include unvalidated device data. ? ?Last Pain:  ?Vitals:  ? 09/11/21 1106  ?TempSrc: Oral  ?PainSc: 0-No pain  ?   ? ?  ? ?Complications: No notable events documented. ?

## 2021-09-11 NOTE — Op Note (Signed)
Operative Note: DRUG INDUCED SLEEP ENDOSCOPY ? ?Patient: Corey Guerrero ? ?Medical record number: [id number] ? ?Date:09/11/2021 ? ?Pre-operative Indications: 1.  Obstructive Sleep Apnea ? ?Postoperative Indications: Same ? ?Surgical Procedure: 1.  Drug Induced Sleep Endoscopy (DISE) ? ?Anesthesia: MAC with IV sedation ? ?Surgeon: Delsa Bern, M.D. ? ?Complications: None ? ?BMI: 30.5 kg/m? ? ?EBL: None ? ?Findings: There is no evidence of complete concentric palatal obstruction.  Anatomically the patient should be a candidate for hypoglossal nerve stimulation therapy. ? ?  ? ?Brief History: ?The patient is a 66 y.o. male with a history of obstructive sleep apnea. The patient has undergone previous sleep study which showed mild to moderate levels of obstructive sleep apnea.  The patient was prescribed CPAP which they consistently attempted to use without success.  Given the patient's history and findings, the above drug-induced sleep endoscopy was recommended to assess the patient's anatomic level of apnea.  Risks and benefits were discussed in detail with the patient today understand and agree with our plan for surgery which is scheduled at [facility] under sedated anesthesia as an outpatient. ? ?Surgical Procedure: ?The patient is brought to the operating room on 09/11/2021 and placed in supine position on the operating table.  Intravenous sedated anesthesia was established without difficulty using the standard drug-induced sleep endoscopy protocol. When the patient was adequately anesthetized, surgical timeout was performed and correct identification of the patient and the surgical procedure.   ? ?A propofol infusion was administered and the patient was monitored carefully to achieve a level of sedation appropriate for DISE.  The patient did not respond to verbal commands but still had spontaneous respiration, sleep disordered breathing and associated desaturations were observed. ? ?With the patient under adequate  sedated anesthesia the flexible nasal laryngoscope was passed without difficulty.  The patient's nasal cavity showed no obstruction, patent.  The endoscope was then passed to visualize the velopharynx, oropharynx, tongue base and epiglottis to assess areas of obstruction.  Patient's airway showed primarily anterior to posterior obstruction of the soft palate to the posterior nasal and oral pharynx.  Some mild lateral soft tissue movement.   There was no evidence of complete concentric palatal obstruction and the patient appeared to be a candidate anatomically for hypoglossal nerve stimulation therapy. ? ?Surgical sponge count was correct. Patient was awakened from anesthetic and transferred from the operating room to the recovery room in stable condition. There were no complications and no blood loss. ? ? ?Delsa Bern, M.D. ?[facility] ?09/11/2021  ?

## 2021-09-11 NOTE — Anesthesia Postprocedure Evaluation (Signed)
Anesthesia Post Note ? ?Patient: Corey Guerrero ? ?Procedure(s) Performed: DRUG INDUCED SLEEP ENDOSCOPY ? ?  ? ?Patient location during evaluation: PACU ?Anesthesia Type: MAC ?Level of consciousness: awake and alert ?Pain management: pain level controlled ?Vital Signs Assessment: post-procedure vital signs reviewed and stable ?Respiratory status: spontaneous breathing, nonlabored ventilation, respiratory function stable and patient connected to nasal cannula oxygen ?Cardiovascular status: stable and blood pressure returned to baseline ?Postop Assessment: no apparent nausea or vomiting ?Anesthetic complications: no ? ? ?No notable events documented. ? ?Last Vitals:  ?Vitals:  ? 09/11/21 1215 09/11/21 1230  ?BP: (!) 128/92 (!) 137/94  ?Pulse: (!) 49 (!) 45  ?Resp: 14 17  ?Temp: 36.6 ?C   ?SpO2: 95% 98%  ?  ?Last Pain:  ?Vitals:  ? 09/11/21 1215  ?TempSrc:   ?PainSc: 0-No pain  ? ? ?  ?  ?  ?  ?  ?  ? ?[place] ? ? ? ? ?
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
## 2021-09-11 NOTE — Anesthesia Procedure Notes (Signed)
Procedure Name: [place] ?Date/Time: 09/11/2021 12:09 PM ?Performed by: Signe Colt, CRNA ?Pre-anesthesia Checklist: Patient identified, Emergency Drugs available, Suction available, Patient being monitored and Timeout performed ?Patient Re-evaluated:Patient Re-evaluated prior to induction ?Oxygen Delivery Method: Simple face mask ? ? ? ? ?

## 2021-09-12 ENCOUNTER — Encounter (HOSPITAL_BASED_OUTPATIENT_CLINIC_OR_DEPARTMENT_OTHER): Payer: Self-pay | Admitting: Otolaryngology

## 2021-09-16 DIAGNOSIS — A084 Viral intestinal infection, unspecified: Secondary | ICD-10-CM | POA: Diagnosis not present

## 2021-09-16 DIAGNOSIS — R112 Nausea with vomiting, unspecified: Secondary | ICD-10-CM | POA: Diagnosis not present

## 2021-09-17 ENCOUNTER — Other Ambulatory Visit: Payer: Self-pay

## 2021-09-22 ENCOUNTER — Other Ambulatory Visit: Payer: Medicare Other

## 2021-09-25 DIAGNOSIS — N189 Chronic kidney disease, unspecified: Secondary | ICD-10-CM | POA: Diagnosis not present

## 2021-09-25 DIAGNOSIS — N2581 Secondary hyperparathyroidism of renal origin: Secondary | ICD-10-CM | POA: Diagnosis not present

## 2021-09-25 DIAGNOSIS — I129 Hypertensive chronic kidney disease with stage 1 through stage 4 chronic kidney disease, or unspecified chronic kidney disease: Secondary | ICD-10-CM | POA: Diagnosis not present

## 2021-09-25 DIAGNOSIS — N183 Chronic kidney disease, stage 3 unspecified: Secondary | ICD-10-CM | POA: Diagnosis not present

## 2021-09-25 DIAGNOSIS — D631 Anemia in chronic kidney disease: Secondary | ICD-10-CM | POA: Diagnosis not present

## 2021-09-27 ENCOUNTER — Other Ambulatory Visit: Payer: Self-pay | Admitting: Otolaryngology

## 2021-09-27 ENCOUNTER — Encounter (HOSPITAL_BASED_OUTPATIENT_CLINIC_OR_DEPARTMENT_OTHER): Payer: Self-pay | Admitting: Otolaryngology

## 2021-09-27 ENCOUNTER — Ambulatory Visit
Admission: RE | Admit: 2021-09-27 | Discharge: 2021-09-27 | Disposition: A | Discharge status: Home / Self Care | Payer: Medicare Other | Source: Ambulatory Visit | Attending: Sports Medicine | Admitting: Sports Medicine

## 2021-09-27 ENCOUNTER — Other Ambulatory Visit: Payer: Self-pay

## 2021-09-27 DIAGNOSIS — S46312A Strain of muscle, fascia and tendon of triceps, left arm, initial encounter: Secondary | ICD-10-CM

## 2021-09-27 DIAGNOSIS — M25422 Effusion, left elbow: Secondary | ICD-10-CM | POA: Diagnosis not present

## 2021-09-27 DIAGNOSIS — M19022 Primary osteoarthritis, left elbow: Secondary | ICD-10-CM | POA: Diagnosis not present

## 2021-09-27 DIAGNOSIS — M25522 Pain in left elbow: Secondary | ICD-10-CM

## 2021-10-01 ENCOUNTER — Other Ambulatory Visit: Payer: Self-pay

## 2021-10-04 ENCOUNTER — Encounter (HOSPITAL_COMMUNITY): Payer: Self-pay | Admitting: Orthopedic Surgery

## 2021-10-04 ENCOUNTER — Other Ambulatory Visit: Payer: Self-pay

## 2021-10-04 NOTE — Progress Notes (Signed)
Left voicemail with new arrival time of 0730 tomorrow  ?
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
## 2021-10-04 NOTE — Progress Notes (Signed)
Spoke with pt for pre-op call. Pt has hx of HTN, denies cardiac history. Pt has sleep apnea and he states he is scheduled later this month for the [place] surgery. Pt states that is why he is having surgery here tomorrow because of needing "deep" anesthesia. Pt is not diabetic.  ? ?Shower instructions given to pt.  ?

## 2021-10-05 ENCOUNTER — Other Ambulatory Visit: Payer: Self-pay

## 2021-10-05 ENCOUNTER — Ambulatory Visit (HOSPITAL_COMMUNITY): Payer: Medicare Other | Admitting: Certified Registered Nurse Anesthetist

## 2021-10-05 ENCOUNTER — Ambulatory Visit (HOSPITAL_BASED_OUTPATIENT_CLINIC_OR_DEPARTMENT_OTHER): Payer: Medicare Other | Admitting: Certified Registered Nurse Anesthetist

## 2021-10-05 ENCOUNTER — Encounter (HOSPITAL_COMMUNITY): Payer: Self-pay | Admitting: Orthopedic Surgery

## 2021-10-05 ENCOUNTER — Ambulatory Visit (HOSPITAL_COMMUNITY)
Admission: RE | Admit: 2021-10-05 | Discharge: 2021-10-05 | Disposition: A | Discharge status: Home / Self Care | Payer: Medicare Other | Source: Ambulatory Visit | Attending: Orthopedic Surgery | Admitting: Orthopedic Surgery

## 2021-10-05 ENCOUNTER — Encounter (HOSPITAL_COMMUNITY)
Admission: RE | Disposition: A | Discharge status: Home / Self Care | Payer: Self-pay | Source: Ambulatory Visit | Attending: Orthopedic Surgery

## 2021-10-05 DIAGNOSIS — N289 Disorder of kidney and ureter, unspecified: Secondary | ICD-10-CM

## 2021-10-05 DIAGNOSIS — X58XXXA Exposure to other specified factors, initial encounter: Secondary | ICD-10-CM | POA: Diagnosis not present

## 2021-10-05 DIAGNOSIS — I10 Essential (primary) hypertension: Secondary | ICD-10-CM | POA: Diagnosis not present

## 2021-10-05 DIAGNOSIS — G4730 Sleep apnea, unspecified: Secondary | ICD-10-CM

## 2021-10-05 DIAGNOSIS — S46302A Unspecified injury of muscle, fascia and tendon of triceps, left arm, initial encounter: Secondary | ICD-10-CM | POA: Diagnosis present

## 2021-10-05 DIAGNOSIS — K219 Gastro-esophageal reflux disease without esophagitis: Secondary | ICD-10-CM | POA: Insufficient documentation

## 2021-10-05 HISTORY — DX: Sleep apnea, unspecified: G47.30

## 2021-10-05 HISTORY — PX: TRICEPS TENDON REPAIR: SHX2577

## 2021-10-05 LAB — CBC
HCT: 46.4 % (ref 39.0–52.0)
Hemoglobin: 15.9 g/dL (ref 13.0–17.0)
MCH: 32.3 pg (ref 26.0–34.0)
MCHC: 34.3 g/dL (ref 30.0–36.0)
MCV: 94.1 fL (ref 80.0–100.0)
Platelets: 169 10*3/uL (ref 150–400)
RBC: 4.93 MIL/uL (ref 4.22–5.81)
RDW: 12.4 % (ref 11.5–15.5)
WBC: 5.8 10*3/uL (ref 4.0–10.5)
nRBC: 0 % (ref 0.0–0.2)

## 2021-10-05 LAB — BASIC METABOLIC PANEL
Anion gap: 10 (ref 5–15)
BUN: 31 mg/dL — ABNORMAL HIGH (ref 8–23)
CO2: 24 mmol/L (ref 22–32)
Calcium: 9.5 mg/dL (ref 8.9–10.3)
Chloride: 103 mmol/L (ref 98–111)
Creatinine, Ser: 2.2 mg/dL — ABNORMAL HIGH (ref 0.61–1.24)
GFR, Estimated: 33 mL/min — ABNORMAL LOW (ref >60)
Glucose, Bld: 106 mg/dL — ABNORMAL HIGH (ref 70–99)
Potassium: 3.4 mmol/L — ABNORMAL LOW (ref 3.5–5.1)
Sodium: 137 mmol/L (ref 135–145)

## 2021-10-05 SURGERY — REPAIR, TENDON, TRICEPS
Anesthesia: General | Site: Arm Lower | Laterality: Left

## 2021-10-05 MED ORDER — EPHEDRINE 5 MG/ML INJ
INTRAVENOUS | Status: AC
  Filled 2021-10-05: qty 5

## 2021-10-05 MED ORDER — PROPOFOL 10 MG/ML IV BOLUS
INTRAVENOUS | Status: AC
  Filled 2021-10-05: qty 20

## 2021-10-05 MED ORDER — 0.9 % SODIUM CHLORIDE (POUR BTL) OPTIME
TOPICAL | Status: DC | PRN
  Administered 2021-10-05: 1000 mL

## 2021-10-05 MED ORDER — CHLORHEXIDINE GLUCONATE 0.12 % MT SOLN
15.0000 mL | OROMUCOSAL | Status: AC
  Administered 2021-10-05: 15 mL via OROMUCOSAL
  Filled 2021-10-05: qty 15

## 2021-10-05 MED ORDER — SUGAMMADEX SODIUM 200 MG/2ML IV SOLN
INTRAVENOUS | Status: DC | PRN
  Administered 2021-10-05: 200 mg via INTRAVENOUS

## 2021-10-05 MED ORDER — ONDANSETRON HCL 4 MG/2ML IJ SOLN
INTRAMUSCULAR | Status: AC
  Filled 2021-10-05: qty 2

## 2021-10-05 MED ORDER — FENTANYL CITRATE (PF) 100 MCG/2ML IJ SOLN: 75.0000 ug | Freq: Once | INTRAMUSCULAR | Status: AC

## 2021-10-05 MED ORDER — GLYCOPYRROLATE PF 0.2 MG/ML IJ SOSY
PREFILLED_SYRINGE | INTRAMUSCULAR | Status: DC | PRN
  Administered 2021-10-05 (×2): .1 mg via INTRAVENOUS

## 2021-10-05 MED ORDER — DEXAMETHASONE SODIUM PHOSPHATE 10 MG/ML IJ SOLN
INTRAMUSCULAR | Status: AC
  Filled 2021-10-05: qty 1

## 2021-10-05 MED ORDER — ROCURONIUM BROMIDE 10 MG/ML (PF) SYRINGE
PREFILLED_SYRINGE | INTRAVENOUS | Status: DC | PRN
  Administered 2021-10-05: 60 mg via INTRAVENOUS

## 2021-10-05 MED ORDER — MIDAZOLAM HCL 2 MG/2ML IJ SOLN
INTRAMUSCULAR | Status: AC
  Filled 2021-10-05: qty 2

## 2021-10-05 MED ORDER — FENTANYL CITRATE (PF) 250 MCG/5ML IJ SOLN
INTRAMUSCULAR | Status: DC | PRN
  Administered 2021-10-05: 100 ug via INTRAVENOUS

## 2021-10-05 MED ORDER — BUPIVACAINE LIPOSOME 1.3 % IJ SUSP
INTRAMUSCULAR | Status: DC | PRN
  Administered 2021-10-05: 10 mL via PERINEURAL

## 2021-10-05 MED ORDER — CEFAZOLIN SODIUM-DEXTROSE 2-4 GM/100ML-% IV SOLN
2.0000 g | INTRAVENOUS | Status: AC
  Administered 2021-10-05: 2 g via INTRAVENOUS
  Filled 2021-10-05: qty 100

## 2021-10-05 MED ORDER — ROCURONIUM BROMIDE 10 MG/ML (PF) SYRINGE
PREFILLED_SYRINGE | INTRAVENOUS | Status: AC
  Filled 2021-10-05: qty 10

## 2021-10-05 MED ORDER — MIDAZOLAM HCL 2 MG/2ML IJ SOLN
INTRAMUSCULAR | Status: AC
  Administered 2021-10-05: 2 mg via INTRAVENOUS
  Filled 2021-10-05: qty 2

## 2021-10-05 MED ORDER — ONDANSETRON HCL 4 MG PO TABS
4.0000 mg | ORAL_TABLET | Freq: Three times a day (TID) | ORAL | 0 refills | Status: AC | PRN
  Filled 2021-10-05: qty 10, 4d supply, fill #0

## 2021-10-05 MED ORDER — LIDOCAINE 2% (20 MG/ML) 5 ML SYRINGE
INTRAMUSCULAR | Status: DC | PRN
  Administered 2021-10-05: 50 mg via INTRAVENOUS

## 2021-10-05 MED ORDER — ONDANSETRON HCL 4 MG/2ML IJ SOLN
INTRAMUSCULAR | Status: DC | PRN
  Administered 2021-10-05: 4 mg via INTRAVENOUS

## 2021-10-05 MED ORDER — DEXAMETHASONE SODIUM PHOSPHATE 10 MG/ML IJ SOLN
INTRAMUSCULAR | Status: DC | PRN
Start: 2021-10-05 — End: 2021-10-05
  Administered 2021-10-05: 5 mg via INTRAVENOUS

## 2021-10-05 MED ORDER — OXYCODONE HCL 5 MG PO TABS
ORAL_TABLET | ORAL | 0 refills | Status: AC
  Filled 2021-10-05: qty 30, 5d supply, fill #0

## 2021-10-05 MED ORDER — EPHEDRINE SULFATE-NACL 50-0.9 MG/10ML-% IV SOSY
PREFILLED_SYRINGE | INTRAVENOUS | Status: DC | PRN
  Administered 2021-10-05: 5 mg via INTRAVENOUS
  Administered 2021-10-05: 10 mg via INTRAVENOUS
  Administered 2021-10-05: 5 mg via INTRAVENOUS

## 2021-10-05 MED ORDER — PROPOFOL 10 MG/ML IV BOLUS
INTRAVENOUS | Status: DC | PRN
  Administered 2021-10-05: 200 mg via INTRAVENOUS

## 2021-10-05 MED ORDER — LACTATED RINGERS IV SOLN
INTRAVENOUS | Status: DC
Start: 2021-10-05 — End: 2021-10-05

## 2021-10-05 MED ORDER — FENTANYL CITRATE (PF) 250 MCG/5ML IJ SOLN
INTRAMUSCULAR | Status: AC
Start: 2021-10-05 — End: ?
  Filled 2021-10-05: qty 5

## 2021-10-05 MED ORDER — FENTANYL CITRATE (PF) 100 MCG/2ML IJ SOLN
INTRAMUSCULAR | Status: AC
  Administered 2021-10-05: 75 ug via INTRAVENOUS
  Filled 2021-10-05: qty 2

## 2021-10-05 MED ORDER — MIDAZOLAM HCL 2 MG/2ML IJ SOLN: 2.0000 mg | Freq: Once | INTRAMUSCULAR | Status: AC

## 2021-10-05 MED ORDER — ACETAMINOPHEN 500 MG PO TABS
1000.0000 mg | ORAL_TABLET | Freq: Three times a day (TID) | ORAL | 0 refills | Status: AC
  Filled 2021-10-05: qty 84, 14d supply, fill #0

## 2021-10-05 MED ORDER — LIDOCAINE 2% (20 MG/ML) 5 ML SYRINGE
INTRAMUSCULAR | Status: AC
  Filled 2021-10-05: qty 5

## 2021-10-05 MED ORDER — BUPIVACAINE-EPINEPHRINE (PF) 0.5% -1:200000 IJ SOLN
INTRAMUSCULAR | Status: DC | PRN
  Administered 2021-10-05: 15 mL via PERINEURAL

## 2021-10-05 SURGICAL SUPPLY — 59 items
BAG COUNTER SPONGE SURGICOUNT (BAG) ×2 IMPLANT
BAG SPNG CNTER NS LX DISP (BAG) ×1
BLADE CLIPPER SURG (BLADE) IMPLANT
BNDG COHESIVE 4X5 TAN STRL (GAUZE/BANDAGES/DRESSINGS) ×2 IMPLANT
BNDG ELASTIC 3X5.8 VLCR STR LF (GAUZE/BANDAGES/DRESSINGS) ×2 IMPLANT
BNDG ELASTIC 4X5.8 VLCR STR LF (GAUZE/BANDAGES/DRESSINGS) ×3 IMPLANT
BNDG GAUZE ELAST 4 BULKY (GAUZE/BANDAGES/DRESSINGS) ×2 IMPLANT
COVER SURGICAL LIGHT HANDLE (MISCELLANEOUS) ×1 IMPLANT
CUFF TOURN SGL QUICK 18 NS (TOURNIQUET CUFF) ×1 IMPLANT
DRAPE OEC MINIVIEW 54X84 (DRAPES) IMPLANT
DRAPE U-SHAPE 47X51 STRL (DRAPES) ×2 IMPLANT
ELECT REM PT RETURN 9FT ADLT (ELECTROSURGICAL)
ELECTRODE REM PT RTRN 9FT ADLT (ELECTROSURGICAL) IMPLANT
GAUZE SPONGE 4X4 12PLY STRL (GAUZE/BANDAGES/DRESSINGS) ×1 IMPLANT
GAUZE SPONGE 4X4 12PLY STRL LF (GAUZE/BANDAGES/DRESSINGS) ×1 IMPLANT
GAUZE XEROFORM 1X8 LF (GAUZE/BANDAGES/DRESSINGS) ×1 IMPLANT
GAUZE XEROFORM 5X9 LF (GAUZE/BANDAGES/DRESSINGS) ×1 IMPLANT
GLOVE SRG 8 PF TXTR STRL LF DI (GLOVE) ×1 IMPLANT
GLOVE SURG ENC MOIS LTX SZ7 (GLOVE) ×2 IMPLANT
GLOVE SURG ENC MOIS LTX SZ7.5 (GLOVE) ×2 IMPLANT
GLOVE SURG UNDER POLY LF SZ8 (GLOVE) ×2
GOWN STRL REUS W/ TWL LRG LVL3 (GOWN DISPOSABLE) ×4 IMPLANT
GOWN STRL REUS W/ TWL XL LVL3 (GOWN DISPOSABLE) ×1 IMPLANT
GOWN STRL REUS W/TWL LRG LVL3 (GOWN DISPOSABLE) ×8
GOWN STRL REUS W/TWL XL LVL3 (GOWN DISPOSABLE) ×2
IMP SYS 2ND FIX PEEK 4.75X19.1 (Miscellaneous) ×2 IMPLANT
IMPL SYS 2ND FX PEEK 4.75X19.1 (Miscellaneous) IMPLANT
KIT BASIN OR (CUSTOM PROCEDURE TRAY) ×2 IMPLANT
KIT TURNOVER KIT B (KITS) ×2 IMPLANT
MANIFOLD NEPTUNE II (INSTRUMENTS) ×1 IMPLANT
NDL KEITH (NEEDLE) ×1 IMPLANT
NDL TAPERED W/ NITINOL LOOP (MISCELLANEOUS) IMPLANT
NEEDLE 22X1 1/2 (OR ONLY) (NEEDLE) IMPLANT
NEEDLE KEITH (NEEDLE) ×2 IMPLANT
NEEDLE TAPERED W/ NITINOL LOOP (MISCELLANEOUS) ×2 IMPLANT
NS IRRIG 1000ML POUR BTL (IV SOLUTION) ×2 IMPLANT
PACK ORTHO EXTREMITY (CUSTOM PROCEDURE TRAY) ×2 IMPLANT
PAD ABD 7.5X8 STRL (GAUZE/BANDAGES/DRESSINGS) ×1 IMPLANT
PAD ARMBOARD 7.5X6 YLW CONV (MISCELLANEOUS) ×4 IMPLANT
PAD CAST 4YDX4 CTTN HI CHSV (CAST SUPPLIES) ×1 IMPLANT
PADDING CAST COTTON 4X4 STRL (CAST SUPPLIES) ×4
RETRIEVER SUT HEWSON (MISCELLANEOUS) ×1 IMPLANT
SLING ARM IMMOBILIZER MED (SOFTGOODS) ×1 IMPLANT
SPONGE T-LAP 18X18 ~~LOC~~+RFID (SPONGE) ×1 IMPLANT
SPONGE T-LAP 4X18 ~~LOC~~+RFID (SPONGE) ×1 IMPLANT
STOCKINETTE IMPERVIOUS 9X36 MD (GAUZE/BANDAGES/DRESSINGS) ×2 IMPLANT
STRIP CLOSURE SKIN 1/2X4 (GAUZE/BANDAGES/DRESSINGS) ×4 IMPLANT
SUCTION FRAZIER HANDLE 10FR (MISCELLANEOUS) ×2
SUCTION TUBE FRAZIER 10FR DISP (MISCELLANEOUS) ×1 IMPLANT
SUT ETHILON 2 0 FS 18 (SUTURE) ×1 IMPLANT
SUT FIBERWIRE #2 38 T-5 BLUE (SUTURE) ×8
SUT MNCRL AB 4-0 PS2 18 (SUTURE) ×2 IMPLANT
SUT VIC AB 2-0 FS1 27 (SUTURE) ×2 IMPLANT
SUTURE FIBERWR #2 38 T-5 BLUE (SUTURE) IMPLANT
SYR CONTROL 10ML LL (SYRINGE) IMPLANT
TOWEL GREEN STERILE (TOWEL DISPOSABLE) ×2 IMPLANT
TOWEL GREEN STERILE FF (TOWEL DISPOSABLE) ×2 IMPLANT
TUBE CONNECTING 12X1/4 (SUCTIONS) ×2 IMPLANT
WATER STERILE IRR 1000ML POUR (IV SOLUTION) ×2 IMPLANT

## 2021-10-05 NOTE — Anesthesia Procedure Notes (Addendum)
Anesthesia Regional Block: Supraclavicular block  ? ?Pre-Anesthetic Checklist: , timeout performed,  Correct Patient, Correct Site, Correct Laterality,  Correct Procedure, Correct Position, site marked,  Risks and benefits discussed,  Surgical consent,  Pre-op evaluation,  At surgeon's request and post-op pain management ? ?Laterality: Left ? ?Prep: chloraprep     ?  ?Needles:  ?Injection technique: Single-shot ? ?Needle Type: Stimulator Needle - 40   ? ? ? ? ? ? ? ?Additional Needles: ? ? ?Procedures: Doppler guided,,,, ultrasound used (permanent image in chart),,    ? ?Nerve Stimulator or Paresthesia:  ?Response: 0.5 mA ? ?Additional Responses:  ? ?Narrative:  ?Start time: 10/05/2021 8:50 AM ?End time: 10/05/2021 9:05 AM ?Injection made incrementally with aspirations every 5 mL. ? ?Performed by: Personally  ?Anesthesiologist: Belinda Block, MD ? ? ? ? ?

## 2021-10-05 NOTE — Anesthesia Postprocedure Evaluation (Signed)
Anesthesia Post Note ? ?Patient: Corey Guerrero ? ?Procedure(s) Performed: TRICEPS  REPAIR (Left: Arm Lower) ? ?  ? ?Patient location during evaluation: PACU ?Anesthesia Type: General ?Level of consciousness: awake ?Pain management: pain level controlled ?Vital Signs Assessment: post-procedure vital signs reviewed and stable ?Respiratory status: spontaneous breathing ?Cardiovascular status: stable ?Postop Assessment: no apparent nausea or vomiting ?Anesthetic complications: no ? ? ?No notable events documented. ? ?Last Vitals:  ?Vitals:  ? 10/05/21 1146 10/05/21 1200  ?BP: (!) 150/86 (!) 144/91  ?Pulse: (!) 51 (!) 51  ?Resp: 19 19  ?Temp: 36.7 ?C   ?SpO2: 100% 94%  ?  ?Last Pain:  ?Vitals:  ? 10/05/21 1146  ?TempSrc:   ?PainSc: 0-No pain  ? ? ?  ?  ?  ?  ?  ?  ? ?Josephanthony Tindel ? ? ? ? ?

## 2021-10-05 NOTE — Anesthesia Procedure Notes (Signed)
Procedure Name: Intubation ?Date/Time: 10/05/2021 10:27 AM ?Performed by: Dorthea Cove, CRNA ?Pre-anesthesia Checklist: Patient identified, Emergency Drugs available, Suction available and Patient being monitored ?Patient Re-evaluated:Patient Re-evaluated prior to induction ?Oxygen Delivery Method: Circle system utilized ?Preoxygenation: Pre-oxygenation with 100% oxygen ?Induction Type: IV induction ?Ventilation: Mask ventilation without difficulty ?Laryngoscope Size: Mac and 4 ?Grade View: Grade II ?Tube type: Oral ?Tube size: 7.5 mm ?Number of attempts: 1 ?Airway Equipment and Method: Stylet and Oral airway ?Placement Confirmation: ETT inserted through vocal cords under direct vision, positive ETCO2 and breath sounds checked- equal and bilateral ?Secured at: 23 cm ?Tube secured with: Tape ?Dental Injury: Teeth and Oropharynx as per pre-operative assessment  ? ? ? ? ?

## 2021-10-05 NOTE — Op Note (Signed)
10/05/2021 ? ?11:12 AM ? ?PATIENT:  [name]   ? ?PRE-OPERATIVE DIAGNOSIS:  TRICEP INJURY ? ?POST-OPERATIVE DIAGNOSIS:  Same ? ?PROCEDURE:  TRICEPS  REPAIR ? ?SURGEON:  [name], MD ? ?ASSISTANT: Aggie Moats, PA-C, he was present and scrubbed throughout the case, critical for completion in a timely fashion, and for retraction, instrumentation, and closure. ? ? ?ANESTHESIA:   gen/block ? ?PREOPERATIVE INDICATIONS:  Corey Guerrero is a  67 y.o. male with a diagnosis of TRICEP INJURY who failed conservative measures and elected for surgical management.   ? ?The risks benefits and alternatives were discussed with the patient preoperatively including but not limited to the risks of infection, bleeding, nerve injury, cardiopulmonary complications, the need for revision surgery, among others, and the patient was willing to proceed. ? ?OPERATIVE IMPLANTS: arthrex anchor ? ?OPERATIVE FINDINGS: triceps rupture and bursitis ? ?BLOOD LOSS: min ? ?COMPLICATIONS: none ? ?TOURNIQUET TIME: 16min ? ?OPERATIVE PROCEDURE:  Patient was identified in the preoperative holding area and site was marked by me He was transported to the operating theater and placed on the table in supine position taking care to pad all bony prominences. After a preincinduction time out anesthesia was induced. The left upper extremity was prepped and draped in normal sterile fashion and a pre-incision timeout was performed. He received ancef for preoperative antibiotics.  ? ?He was placed in the lateral position using a beanbag and a ax roll again padding all bony prominences. ? ?Tourniquet was applied to the left arm and inflated to 200 mmHg ? ?I made a posterior incision over his olecranon and triceps tendon and dissected down to these I performed a bursectomy of his olecranon bursa he had significant swelling there and small loose body.  This was removed ? ?Identified his ulnar nerve and protected it throughout the case ? ?I then identified  his triceps rupture placed 2 whipstitch FiberWire stitches in this ? ?I then drilled 2 drill tunnels in the ulna I passed the stitches through the bone tunnels creating a double row dual pass construct I then secured these into a more distal swivel lock anchor is very happy with the apposition and repair of his tendon it was stable to 90 degrees ? ?I thoroughly irrigated and then closed in layers he was placed in a sling ? ?POST OPERATIVE PLAN: Sling full-time mobilize for DVT prophylaxis ? ? ? ?

## 2021-10-05 NOTE — Transfer of Care (Signed)
Immediate Anesthesia Transfer of Care Note ? ?Patient: Corey Guerrero ? ?Procedure(s) Performed: TRICEPS  REPAIR (Left: Arm Lower) ? ?Patient Location: PACU ? ?Anesthesia Type:General ? ?Level of Consciousness: awake and drowsy ? ?Airway & Oxygen Therapy: Patient Spontanous Breathing and Patient connected to nasal cannula oxygen ? ?Post-op Assessment: Report given to RN and Post -op Vital signs reviewed and stable ? ?Post vital signs: Reviewed and stable ? ?Last Vitals:  ?Vitals Value Taken Time  ?BP 150/86 10/05/21 1146  ?Temp    ?Pulse 47 10/05/21 1148  ?Resp 18 10/05/21 1148  ?SpO2 98 % 10/05/21 1148  ?Vitals shown include unvalidated device data. ? ?Last Pain:  ?Vitals:  ? 10/05/21 0900  ?TempSrc:   ?PainSc: 0-No pain  ?   ? ?Patients Stated Pain Goal: 0 (10/05/21 0805) ? ?Complications: No notable events documented. ?

## 2021-10-05 NOTE — Discharge Instructions (Signed)
?[facility] ?[address], [address] ?[phone] (tel)   [phone] (fax) ? ? ?POST-OPERATIVE INSTRUCTIONS ? ?WOUND CARE ?You may remove the Operative Dressing on Post-Op Day #3 (72hrs after surgery).   ?Alternatively if you would like you can leave dressing on until follow-up if within 7-8 days but keep it dry. ?Leave steri-strips in place until they fall off on their own, usually 2 weeks postop. ?There may be a small amount of fluid/bleeding leaking at the surgical site.  ?This is normal; the shoulder is filled with fluid during the procedure and can leak for 24-48hrs after surgery.  ?You may change/reinforce the bandage as needed.  ?Use the Cryocuff or Ice as often as possible for the first 7 days, then as needed for pain relief. Always keep a towel, ACE wrap or other barrier between the cooling unit and your skin.  ?You may shower on Post-Op Day #3. Gently pat the area dry. Do not soak the shoulder in water or submerge it. Keep dry incisions as dry as possible. ?Do not go swimming in the pool or ocean until 4 weeks after surgery or when otherwise instructed.   ? ?EXERCISES/BRACING ?Sling should be used at all times until follow-up.  ?You can remove sling for hygiene.    ?Please continue to ambulate and do not stay sitting or lying for too long. Perform foot and wrist pumps to assist in circulation. ? ?POST-OP MEDICATIONS- Multimodal approach to pain control ?In general your pain will be controlled with a combination of substances.  Prescriptions unless otherwise discussed are electronically sent to your pharmacy.  This is a carefully made plan we use to minimize narcotic use.    ? ?Acetaminophen - Non-narcotic pain medicine taken on a scheduled basis  ?Oxycodone - This is a strong narcotic, to be used only on an ?as needed? basis for severe pain. ?Zofran - take as needed for nausea ? ? ?FOLLOW-UP ?If you develop a Fever (?101.5), Redness or Drainage from the surgical incision site,  please call our office to arrange for an evaluation. ?Please call the office to schedule a follow-up appointment for your suture removal, 10-14 days post-operatively. ? ? ? ?HELPFUL INFORMATION ? ?If you had a block, it will wear off between 8-24 hrs postop typically.  This is period when your pain may go from nearly zero to the pain you would have had postop without the block.  This is an abrupt transition but nothing dangerous is happening.  You may take an extra dose of narcotic when this happens. ? ?You may be more comfortable sleeping in a semi-seated position the first few nights following surgery.  Keep a pillow propped under the elbow and forearm for comfort.  If you have a recliner type of chair it might be beneficial.  If not that is fine too, but it would be helpful to sleep propped up with pillows behind your operated shoulder as well under your elbow and forearm.  This will reduce pulling on the suture lines. ? ?When dressing, put your operative arm in the sleeve first.  When getting undressed, take your operative arm out last.  Loose fitting, button-down shirts are recommended.  Often in the first days after surgery you may be more comfortable keeping your operative arm under your shirt and not through the sleeve. ? ?You may return to work/school in the next couple of days when you feel up to it.  Desk work and typing in the sling is fine. ? ?  We suggest you use the pain medication the first night prior to going to bed, in order to ease any pain when the anesthesia wears off. You should avoid taking pain medications on an empty stomach as it will make you nauseous. ? ?You should wean off your narcotic medicines as soon as you are able.  Most patients will be off or using minimal narcotics before their first postop appointment.  ? ?Do not drink alcoholic beverages or take illicit drugs when taking pain medications. ? ?It is against the law to drive while taking narcotics.  In some states it is against  the law to drive while your arm is in a sling.  ? ?Pain medication may make you constipated.  Below are a few solutions to try in this order: ?Decrease the amount of pain medication if you aren't having pain. ?Drink lots of decaffeinated fluids. ?Drink prune juice and/or eat dried prunes ? ?If the first 3 don't work start with additional solutions ?Take Colace - an over-the-counter stool softener ?Take Senokot - an over-the-counter laxative ?Take Miralax - a stronger over-the-counter laxative ? ? ? ?

## 2021-10-05 NOTE — Anesthesia Preprocedure Evaluation (Addendum)
Anesthesia Evaluation  ?Patient identified by MRN, date of birth, ID band ?Patient awake ? ? ? ?Reviewed: ?Allergy & Precautions, NPO status , Patient's Chart, lab work & pertinent test results ? ?Airway ?Mallampati: II ? ?TM Distance: >3 FB ? ? ? ? Dental ?  ?Pulmonary ?sleep apnea ,  ?  ?breath sounds clear to auscultation ? ? ? ? ? ? Cardiovascular ?hypertension,  ?Rhythm:Regular Rate:Normal ? ? ?  ?Neuro/Psych ? Headaches,  Neuromuscular disease   ? GI/Hepatic ?Neg liver ROS, GERD  ,  ?Endo/Other  ? ? Renal/GU ?Renal disease  ? ?  ?Musculoskeletal ? ? Abdominal ?  ?Peds ? Hematology ?  ?Anesthesia Other Findings ? ? Reproductive/Obstetrics ? ?  ? ? ? ? ? ? ? ? ? ? ? ? ? ?  ?  ? ? ? ? ? ? ? ?Anesthesia Physical ?Anesthesia Plan ? ?ASA: 3 ? ?Anesthesia Plan: General  ? ?Post-op Pain Management: Regional block*  ? ?Induction:  ? ?PONV Risk Score and Plan: 2 and Ondansetron, Dexamethasone and Midazolam ? ?Airway Management Planned: Oral ETT ? ?Additional Equipment:  ? ?Intra-op Plan:  ? ?Post-operative Plan: Extubation in OR ? ?Informed Consent: I have reviewed the patients History and Physical, chart, labs and discussed the procedure including the risks, benefits and alternatives for the proposed anesthesia with the patient or authorized representative who has indicated his/her understanding and acceptance.  ? ? ? ? ? ?Plan Discussed with: CRNA and Anesthesiologist ? ?Anesthesia Plan Comments:   ? ? ? ? ? ? ?Anesthesia Quick Evaluation ? ?

## 2021-10-05 NOTE — Interval H&P Note (Signed)
History and Physical Interval Note: ? ?10/05/2021 ?10:10 AM ? ?Corey Guerrero  has presented today for surgery, with the diagnosis of TRICEP INJURY.  The various methods of treatment have been discussed with the patient and family. After consideration of risks, benefits and other options for treatment, the patient has consented to  Procedure(s): ?[name] (Left) as a surgical intervention.  The patient's history has been reviewed, patient examined, no change in status, stable for surgery.  I have reviewed the patient's chart and labs.  Questions were answered to the patient's satisfaction.   ? ? ?[name] ? ? ?

## 2021-10-06 ENCOUNTER — Encounter (HOSPITAL_COMMUNITY): Payer: Self-pay | Admitting: Orthopedic Surgery

## 2021-10-08 ENCOUNTER — Other Ambulatory Visit: Payer: Self-pay

## 2021-10-09 ENCOUNTER — Ambulatory Visit: Payer: Self-pay | Admitting: *Deleted

## 2021-10-09 NOTE — Telephone Encounter (Signed)
Summary: left elbow bandage removal/pain  ? Pt stated that he had surgery recently and that he needs the bandage removed and changed.Pt stated he cannot do this by himself.  ?Pt is asking where he can go to have them removed. Pt stated he is in pain as well pt stated not severe.  ? ?Pt seeking clinical advice.  ? ?No appointments available.   ?  ? ? ?Chief Complaint: dressing question ?Symptoms: none ?Frequency: none ?Pertinent Negatives: Patient denies na ?Disposition: [] ED /[] Urgent Care (no appt availability in office) / [] Appointment(In office/virtual)/ []  [facility]/ [] Home Care/ [] Refused Recommended Disposition /[] [facility]/ []  Follow-up with PCP ?Additional Notes: Pt advised to call surgeon's office to ask them if he can come in for a dressing change. Pt to call back if he is not able to get a resolution to the problem. ? ?Reason for Disposition ? Health Information question, no triage required and triager able to answer question ? ?Answer Assessment - Initial Assessment Questions ?1. REASON FOR CALL or QUESTION: "What is your reason for calling today?" or "How can I best help you?" or "What question do you have that I can help answer?" ?    Pt asking where he can go to have his surgical dressing changed. Advised pt to call the surgeon's office. ? ?Protocols used: Information Only Call - No Triage-A-AH ? ?

## 2021-10-10 ENCOUNTER — Ambulatory Visit (HOSPITAL_BASED_OUTPATIENT_CLINIC_OR_DEPARTMENT_OTHER): Admission: RE | Admit: 2021-10-10 | Payer: Medicare Other | Source: Home / Self Care | Admitting: Otolaryngology

## 2021-10-10 ENCOUNTER — Other Ambulatory Visit: Payer: Self-pay

## 2021-10-10 DIAGNOSIS — I10 Essential (primary) hypertension: Secondary | ICD-10-CM

## 2021-10-10 SURGERY — INSERTION, HYPOGLOSSAL NERVE STIMULATOR
Anesthesia: General | Laterality: Right

## 2021-10-11 ENCOUNTER — Encounter: Payer: Self-pay | Admitting: Nurse Practitioner

## 2021-10-22 ENCOUNTER — Other Ambulatory Visit: Payer: Self-pay

## 2021-11-11 ENCOUNTER — Other Ambulatory Visit: Payer: Self-pay | Admitting: Nurse Practitioner

## 2021-11-11 DIAGNOSIS — I10 Essential (primary) hypertension: Secondary | ICD-10-CM

## 2021-11-13 MED ORDER — LISINOPRIL 20 MG PO TABS
20.0000 mg | ORAL_TABLET | Freq: Every day | ORAL | 1 refills | Status: DC
  Filled 2021-11-13: qty 90, 90d supply, fill #0
  Filled 2021-12-25: qty 90, 90d supply, fill #1

## 2021-11-14 ENCOUNTER — Other Ambulatory Visit: Payer: Self-pay

## 2021-11-19 ENCOUNTER — Other Ambulatory Visit: Payer: Self-pay

## 2021-12-12 ENCOUNTER — Encounter: Payer: Self-pay | Admitting: Nurse Practitioner

## 2021-12-25 ENCOUNTER — Other Ambulatory Visit: Payer: Self-pay

## 2021-12-25 ENCOUNTER — Other Ambulatory Visit: Payer: Self-pay | Admitting: Internal Medicine

## 2021-12-25 ENCOUNTER — Other Ambulatory Visit: Payer: Self-pay | Admitting: Nurse Practitioner

## 2021-12-25 DIAGNOSIS — E785 Hyperlipidemia, unspecified: Secondary | ICD-10-CM

## 2021-12-25 DIAGNOSIS — I10 Essential (primary) hypertension: Secondary | ICD-10-CM

## 2021-12-25 DIAGNOSIS — K219 Gastro-esophageal reflux disease without esophagitis: Secondary | ICD-10-CM

## 2021-12-25 MED ORDER — CLONIDINE HCL 0.1 MG PO TABS
ORAL_TABLET | ORAL | 0 refills | Status: DC
  Filled 2021-12-25: qty 180, 90d supply, fill #0

## 2021-12-25 MED ORDER — ATORVASTATIN CALCIUM 40 MG PO TABS
40.0000 mg | ORAL_TABLET | Freq: Every day | ORAL | 0 refills | Status: DC
  Filled 2021-12-25: qty 90, 90d supply, fill #0

## 2021-12-25 MED ORDER — OMEPRAZOLE 20 MG PO CPDR
DELAYED_RELEASE_CAPSULE | Freq: Every day | ORAL | 0 refills | Status: DC
  Filled 2021-12-25: qty 90, fill #0
  Filled 2022-01-20 – 2022-01-28 (×2): qty 90, 90d supply, fill #0

## 2021-12-28 ENCOUNTER — Encounter: Payer: Self-pay | Admitting: Nurse Practitioner

## 2021-12-28 ENCOUNTER — Ambulatory Visit: Payer: Medicare Other | Attending: Nurse Practitioner | Admitting: Nurse Practitioner

## 2021-12-28 ENCOUNTER — Other Ambulatory Visit: Payer: Self-pay

## 2021-12-28 VITALS — BP 143/87 | HR 44 | Temp 98.2°F | Ht 66.0 in | Wt 185.6 lb

## 2021-12-28 DIAGNOSIS — I10 Essential (primary) hypertension: Secondary | ICD-10-CM

## 2021-12-28 DIAGNOSIS — E876 Hypokalemia: Secondary | ICD-10-CM

## 2021-12-28 DIAGNOSIS — E785 Hyperlipidemia, unspecified: Secondary | ICD-10-CM

## 2021-12-28 DIAGNOSIS — R7989 Other specified abnormal findings of blood chemistry: Secondary | ICD-10-CM | POA: Diagnosis not present

## 2021-12-28 DIAGNOSIS — N521 Erectile dysfunction due to diseases classified elsewhere: Secondary | ICD-10-CM

## 2021-12-28 MED ORDER — ATORVASTATIN CALCIUM 40 MG PO TABS
40.0000 mg | ORAL_TABLET | Freq: Every day | ORAL | 0 refills | Status: DC
  Filled 2021-12-28 – 2022-03-30 (×2): qty 90, 90d supply, fill #0

## 2021-12-28 MED ORDER — CARVEDILOL 12.5 MG PO TABS
12.5000 mg | ORAL_TABLET | Freq: Two times a day (BID) | ORAL | 0 refills | Status: DC
  Filled 2021-12-28 – 2022-01-07 (×2): qty 60, 30d supply, fill #0

## 2021-12-28 MED ORDER — LISINOPRIL 20 MG PO TABS
20.0000 mg | ORAL_TABLET | Freq: Every day | ORAL | 0 refills | Status: DC
Start: 2021-12-28 — End: 2022-03-30
  Filled 2021-12-28 – 2022-03-07 (×4): qty 30, 30d supply, fill #0

## 2021-12-28 MED ORDER — SILDENAFIL CITRATE 100 MG PO TABS
50.0000 mg | ORAL_TABLET | Freq: Every day | ORAL | 11 refills | Status: DC | PRN
  Filled 2021-12-28: qty 5, 5d supply, fill #0

## 2021-12-28 MED ORDER — CLONIDINE HCL 0.1 MG PO TABS
ORAL_TABLET | ORAL | 0 refills | Status: DC
  Filled 2021-12-28 – 2022-03-07 (×4): qty 60, 30d supply, fill #0

## 2021-12-28 MED ORDER — CHLORTHALIDONE 25 MG PO TABS
25.0000 mg | ORAL_TABLET | Freq: Every day | ORAL | 0 refills | Status: DC
  Filled 2021-12-28: qty 30, 30d supply, fill #0

## 2021-12-28 MED ORDER — ASPIRIN 81 MG PO CHEW
81.0000 mg | CHEWABLE_TABLET | Freq: Every day | ORAL | 1 refills | Status: DC
  Filled 2021-12-28 – 2022-04-08 (×6): qty 90, 90d supply, fill #0
  Filled 2022-05-07: qty 36, 30d supply, fill #0
  Filled 2022-06-29: qty 36, 30d supply, fill #1
  Filled 2022-07-10: qty 36, 36d supply, fill #1

## 2021-12-28 MED ORDER — AMLODIPINE BESYLATE 10 MG PO TABS
ORAL_TABLET | Freq: Every day | ORAL | 0 refills | Status: DC
  Filled 2021-12-28 – 2022-01-07 (×2): qty 30, 30d supply, fill #0
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
## 2021-12-29 LAB — CMP14+EGFR
ALT: 35 IU/L (ref 0–44)
AST: 39 IU/L (ref 0–40)
Albumin/Globulin Ratio: 1.3 (ref 1.2–2.2)
Albumin: 4.3 g/dL (ref 3.8–4.8)
Alkaline Phosphatase: 70 IU/L (ref 44–121)
BUN/Creatinine Ratio: 16 (ref 10–24)
BUN: 33 mg/dL — ABNORMAL HIGH (ref 8–27)
Bilirubin Total: 0.7 mg/dL (ref 0.0–1.2)
CO2: 28 mmol/L (ref 20–29)
Calcium: 9.7 mg/dL (ref 8.6–10.2)
Chloride: 100 mmol/L (ref 96–106)
Creatinine, Ser: 2.1 mg/dL — ABNORMAL HIGH (ref 0.76–1.27)
Globulin, Total: 3.2 g/dL (ref 1.5–4.5)
Glucose: 101 mg/dL — ABNORMAL HIGH (ref 70–99)
Potassium: 3.2 mmol/L — ABNORMAL LOW (ref 3.5–5.2)
Sodium: 144 mmol/L (ref 134–144)
Total Protein: 7.5 g/dL (ref 6.0–8.5)
eGFR: 35 mL/min/{1.73_m2} — ABNORMAL LOW (ref >59)

## 2021-12-31 ENCOUNTER — Other Ambulatory Visit: Payer: Self-pay

## 2021-12-31 ENCOUNTER — Encounter: Payer: Self-pay | Admitting: Nurse Practitioner

## 2021-12-31 ENCOUNTER — Other Ambulatory Visit: Payer: Self-pay | Admitting: Nurse Practitioner

## 2021-12-31 MED ORDER — POTASSIUM CHLORIDE CRYS ER 20 MEQ PO TBCR
20.0000 meq | EXTENDED_RELEASE_TABLET | Freq: Every day | ORAL | 0 refills | Status: DC
  Filled 2021-12-31 – 2022-01-07 (×2): qty 14, 14d supply, fill #0

## 2022-01-01 ENCOUNTER — Encounter: Payer: Self-pay | Admitting: Nurse Practitioner

## 2022-01-04 ENCOUNTER — Other Ambulatory Visit: Payer: Self-pay

## 2022-01-07 ENCOUNTER — Other Ambulatory Visit: Payer: Self-pay

## 2022-01-07 ENCOUNTER — Other Ambulatory Visit (HOSPITAL_BASED_OUTPATIENT_CLINIC_OR_DEPARTMENT_OTHER): Payer: Medicare Other | Admitting: Pharmacist

## 2022-01-08 ENCOUNTER — Other Ambulatory Visit: Payer: Self-pay
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
## 2022-01-10 NOTE — Progress Notes (Signed)
Patient was seen in person by Park Liter, PharmD Candidate at [facility] at [facility]. BP today was 126/84, p 47. Pt reports that pulse in 40s in normal for him consistent with chart review.   Patient has an automated home blood pressure machine, but could not remember home readings to report. Knows BP was slightly elevated at last PCP visit on 6/30.   Medication review was performed. They are taking medications as prescribed. Note that pt was instructed to stop taking chlorthalidone 25 mg daily at last PCP appt on 6/30, due to increased Scr and hypokalemia, though medication still appears on list. Pt is adherent to medications. Uses a pill box.   Pt endorses cramping recently, which we discussed could be related to low potassium. Pt to start taking potassium supplement as prescribed by Geryl Rankins after recent BMP.   The following barriers to adherence were noted: pill burden (patient does not like taking medications)   The following interventions were completed:  - Patient was educated on medications, including indication and administration  - Patient was educated on proper technique to check home blood pressure and reminded to bring home machine and readings to next provider appointment  - Patient was educated on how to access home blood pressure machine  - Patient was counseled on lifestyle modifications to improve blood pressure including working towards 150 minutes of moderate-intensity exercise/week and monitoring salt in his diet.   The patient does not have PCP f/u scheduled, but believes [facility] will be calling him to set up a f/u video visit with Geryl Rankins. Pt has cardiology f/u with Richardson Dopp on 7/14.  PCP: Yvone Neu, PharmD, [facility]

## 2022-01-11 ENCOUNTER — Ambulatory Visit: Payer: Medicare Other | Admitting: Physician Assistant

## 2022-01-11 DIAGNOSIS — I10 Essential (primary) hypertension: Secondary | ICD-10-CM
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
## 2022-01-15 ENCOUNTER — Ambulatory Visit (INDEPENDENT_AMBULATORY_CARE_PROVIDER_SITE_OTHER): Payer: Medicare Other | Admitting: Physician Assistant

## 2022-01-15 ENCOUNTER — Encounter: Payer: Self-pay | Admitting: Physician Assistant

## 2022-01-15 VITALS — BP 124/82 | HR 52 | Ht 66.0 in | Wt 188.6 lb

## 2022-01-15 DIAGNOSIS — I10 Essential (primary) hypertension: Secondary | ICD-10-CM | POA: Diagnosis not present

## 2022-01-15 DIAGNOSIS — N182 Chronic kidney disease, stage 2 (mild): Secondary | ICD-10-CM

## 2022-01-15 DIAGNOSIS — R001 Bradycardia, unspecified: Secondary | ICD-10-CM

## 2022-01-15 DIAGNOSIS — I700 Atherosclerosis of aorta: Secondary | ICD-10-CM | POA: Diagnosis not present

## 2022-01-15 DIAGNOSIS — E782 Mixed hyperlipidemia: Secondary | ICD-10-CM

## 2022-01-15 NOTE — Assessment & Plan Note (Signed)
BP remains well controlled. Continue Norvasc 10 mg once daily, Coreg 12.5 mg twice daily, clonidine 0.1 mg twice daily, Lisinopril 20 mg once daily. F/u in 1 year.

## 2022-01-15 NOTE — Assessment & Plan Note (Signed)
He is followed by Nephrology (Dr. Candiss Norse).

## 2022-01-15 NOTE — Patient Instructions (Signed)
Medication Instructions:  Your physician recommends that you continue on your current medications as directed. Please refer to the Current Medication list given to you today.  *If you need a refill on your cardiac medications before your next appointment, please call your pharmacy*   Lab Work: None ordered  If you have labs (blood work) drawn today and your tests are completely normal, you will receive your results only by: [facility] (if you have MyChart) OR A paper copy in the mail If you have any lab test that is abnormal or we need to change your treatment, we will call you to review the results.   Testing/Procedures: None ordered   Follow-Up: At [facility], you and your health needs are our priority.  As part of our continuing mission to provide you with exceptional heart care, we have created designated Provider Care Teams.  These Care Teams include your primary Cardiologist (physician) and Advanced Practice Providers (APPs -  Physician Assistants and Nurse Practitioners) who all work together to provide you with the care you need, when you need it.  We recommend signing up for the patient portal called "MyChart".  Sign up information is provided on this After Visit Summary.  MyChart is used to connect with patients for Virtual Visits (Telemedicine).  Patients are able to view lab/test results, encounter notes, upcoming appointments, etc.  Non-urgent messages can be sent to your provider as well.   To learn more about what you can do with MyChart, go to [email or web address].    Your next appointment:   1 year(s)  The format for your next appointment:   In Person  Provider:   Dorris Carnes, MD  or Richardson Dopp, PA-C         Other Instructions   Important Information About Sugar

## 2022-01-15 NOTE — Assessment & Plan Note (Signed)
Continue ASA, statin. ?

## 2022-01-15 NOTE — Assessment & Plan Note (Signed)
Asymptomatic. His HR has been 40-50s for years. Since his BP is well controlled, I would not adjust his beta-blocker at this time.

## 2022-01-15 NOTE — Assessment & Plan Note (Signed)
Recent LDL optimal on Atorvastatin.

## 2022-01-20 ENCOUNTER — Other Ambulatory Visit: Payer: Self-pay | Admitting: Nurse Practitioner

## 2022-01-21 ENCOUNTER — Other Ambulatory Visit: Payer: Self-pay

## 2022-01-22 NOTE — Telephone Encounter (Signed)
Requested medication (s) are due for refill today: yes  Requested medication (s) are on the active medication list: no expired today 01/22/22  Last refill:  12/31/21  Future visit scheduled: no  Notes to clinic:  Prescription ended today    Requested Prescriptions  Pending Prescriptions Disp Refills   potassium chloride SA (KLOR-CON M) 20 MEQ tablet 14 tablet 0    Sig: Take 1 tablet (20 mEq total) by mouth daily for 14 days.     Endocrinology:  Minerals - Potassium Supplementation Failed - 01/20/2022  6:35 AM      Failed - K in normal range and within 360 days    Potassium  Date Value Ref Range Status  12/28/2021 3.2 (L) 3.5 - 5.2 mmol/L Final         Failed - Cr in normal range and within 360 days    Creat  Date Value Ref Range Status  07/02/2016 1.41 (H) 0.70 - 1.33 mg/dL Final    Comment:      For patients > or = 65 years of age: The upper reference limit for Creatinine is approximately 13% higher for people identified as African-American.      Creatinine, Ser  Date Value Ref Range Status  12/28/2021 2.10 (H) 0.76 - 1.27 mg/dL Final   Creatinine, Urine  Date Value Ref Range Status  05/22/2011 80.8 mg/dL Final         Passed - Valid encounter within last 12 months    Recent Outpatient Visits           3 weeks ago Essential hypertension   [place], Vernia Buff, NP   9 months ago Encounter for annual physical exam   [place], Vernia Buff, NP   1 year ago Essential hypertension   [place], Vernia Buff, NP   1 year ago Poorly-controlled hypertension   [place], Vernia Buff, NP   1 year ago Essential hypertension   [place], Vernia Buff, NP

## 2022-01-23 ENCOUNTER — Other Ambulatory Visit: Payer: Self-pay

## 2022-01-25 ENCOUNTER — Other Ambulatory Visit: Payer: Self-pay

## 2022-01-28 ENCOUNTER — Other Ambulatory Visit: Payer: Self-pay

## 2022-02-01 ENCOUNTER — Emergency Department (HOSPITAL_COMMUNITY)
Admission: EM | Admit: 2022-02-01 | Discharge: 2022-02-01 | Disposition: A | Discharge status: Home / Self Care | Payer: Medicare Other | Attending: Emergency Medicine | Admitting: Emergency Medicine

## 2022-02-01 ENCOUNTER — Encounter (HOSPITAL_COMMUNITY): Payer: Self-pay

## 2022-02-01 ENCOUNTER — Other Ambulatory Visit: Payer: Self-pay

## 2022-02-01 DIAGNOSIS — Z7982 Long term (current) use of aspirin: Secondary | ICD-10-CM | POA: Insufficient documentation

## 2022-02-01 DIAGNOSIS — I10 Essential (primary) hypertension: Secondary | ICD-10-CM | POA: Diagnosis not present

## 2022-02-01 DIAGNOSIS — Z79899 Other long term (current) drug therapy: Secondary | ICD-10-CM | POA: Diagnosis not present

## 2022-02-01 DIAGNOSIS — R040 Epistaxis: Secondary | ICD-10-CM | POA: Insufficient documentation

## 2022-02-01 MED ORDER — LIDOCAINE-EPINEPHRINE 2 %-1:100000 IJ SOLN: 20.0000 mL | Freq: Once | INTRAMUSCULAR | Status: DC

## 2022-02-01 MED ORDER — LIDOCAINE-EPINEPHRINE (PF) 2 %-1:200000 IJ SOLN
20.0000 mL | Freq: Once | INTRAMUSCULAR | Status: AC
  Administered 2022-02-01: 20 mL
  Filled 2022-02-01: qty 20

## 2022-02-01 MED ORDER — SALINE SPRAY 0.65 % NA SOLN
1.0000 | NASAL | Status: DC | PRN
Start: 2022-02-01 — End: 2022-02-01
  Filled 2022-02-01: qty 44

## 2022-02-01 MED ORDER — OXYMETAZOLINE HCL 0.05 % NA SOLN
1.0000 | Freq: Once | NASAL | Status: DC
  Filled 2022-02-01: qty 30

## 2022-02-01 MED ORDER — LIDOCAINE-EPINEPHRINE-TETRACAINE (LET) TOPICAL GEL
3.0000 mL | Freq: Once | TOPICAL | Status: AC
  Administered 2022-02-01: 3 mL via TOPICAL
  Filled 2022-02-01: qty 3

## 2022-02-01 NOTE — Discharge Instructions (Signed)
Get help right away if: You have a nosebleed after a fall or a head injury. Your nosebleed does not go away after 20 minutes. You feel dizzy or weak. You have unusual bleeding from other parts of your body. You have unusual bruising on other parts of your body. You become sweaty. You vomit blood. Nasal Hygiene The nose has many positive effects on the air you breathe in that you may not be aware of. - Temperature regulation - Filtration and removal of particulate matter - Humidification - Defense against infections There are several things you can do to help keep your nose healthy. Foremost is nasal hygiene. This will help with your nose's natural function and keep it moist and healthy. Techniques to accomplish this are outlined below. These will help with nasal dryness, nasal crusting, and nose bleeds. They also assist with clearing thick mucus that cause you to blow your nose frequently and may be associated with thick postnasal drip.  1. Use nasal saline daily. You can buy small bottles of this over-the-counter at the drug store or grocery store. Some brand names are: Ocean, Sea Mist, Ayr. Apply 2-3 sprays each nostril several times a day. If your nose feels dry or have had recent nasal surgery, try to use it every couple of hours. There is no medicine in it so it can be used as often as you like. Do NOT use the sprays containing decongestants. The appropriate way to apply nasal sprays: Place the nozzle just inside your nostril and point it towards the corner of your eye. Often, it is helpful to use the right-hand to spray into the left nasal cavity and use the left-hand to spray into the right side.  2. Use nasal saline irrigations and flushing.SinuCleanse, Simply Saline, Ayr. Use this 1-2 times a day. We can also provide you with a recipe to use at home. Just ask us. To prevent reintroducing bacteria back into your nose, please keep your irrigation equipment clean and dry  between uses. Throw away and replace reusable irrigation equipment every 3 weeks.   3. Use Vaseline petroleum jelly or Aquaphor. You can apply this gently to each nostril 2-3 times a day to promote moisturization for your nose. You may also use triple antibiotic ointment such as Neosporin or Bacitracin. These can all be bought over-the-counter.  4. Consider other nasal emollients. A few preparations are available over-thecounter. Ponaris, Nose Better, Pretz. Ask your pharmacist what is available. Also, some nasal saline sprays have additives such as aloe and these are helpful.  5. Consider using a humidifier at home. If your nose feels dry and/or you have frequent nose bleeds, you can buy a humidifier for your home. Be cautious in using these if you have mold allergies.  6. Avoid excessive manual manipulation of your nose and nostrils. Frequent rubbing of your nostrils and the passing of tissues or fingers in your nostrils may aggravate nasal irritation from dryness and nose bleeds.   

## 2022-02-01 NOTE — ED Provider Notes (Signed)
[facility] EMERGENCY DEPARTMENT Provider Note   CSN: [id number] Arrival date & time: 02/01/22  3220     History  Chief Complaint  Patient presents with   Epistaxis    GOEBEL HELLUMS is a 66 y.o. male.  The history is provided by the patient. No language interpreter was used.  Epistaxis Location:  L nare Severity:  Mild Timing:  Sporadic Progression:  Resolved Chronicity:  New Context: hypertension   Context: not anticoagulants, not aspirin use, not bleeding disorder, not drug use, not foreign body, not nose picking, not recent infection and not trauma   Relieved by:  Applying pressure and vasoconstrictors Exacerbated by: blowing nose. Associated symptoms: congestion   Associated symptoms: no blood in oropharynx, no cough, no dizziness, no facial pain, no fever, no headaches, no sinus pain, no sneezing, no sore throat and no syncope   Risk factors: no alcohol use, no frequent nosebleeds, no intranasal steroids, no recent nasal surgery and no sinus problems        Home Medications Prior to Admission medications   Medication Sig Start Date End Date Taking? Authorizing Provider  amLODipine (NORVASC) 10 MG tablet TAKE 1 TABLET (10 MG TOTAL) BY MOUTH DAILY. 12/28/21   [name], NP  aspirin 81 MG chewable tablet Chew 1 tablet (81 mg total) by mouth daily. 12/28/21   [name], NP  atorvastatin (LIPITOR) 40 MG tablet Take 1 tablet (40 mg total) by mouth daily. 12/28/21   [name], NP  b complex vitamins capsule Take 1 capsule by mouth daily.    [provider]  carvedilol (COREG) 12.5 MG tablet Take 1 tablet (12.5 mg total) by mouth 2 (two) times daily with a meal. 12/28/21 02/07/22  [name], NP  cloNIDine (CATAPRES) 0.1 MG tablet TAKE 1 TABLET (0.1 MG TOTAL) BY MOUTH 2 (TWO) TIMES DAILY. 12/28/21 01/27/22  [name], NP  lisinopril (ZESTRIL) 20 MG tablet Take 1 tablet (20 mg total) by mouth daily. 12/28/21 01/27/22   [name], NP  Misc. Devices MISC Please provide with insurance approved CPAP:  auto CPAP therapy for 4 to 15cm H2O  with a Large size Fisher&Paykel Nasal Mask Eson mask and heated humidification. ICD 10 G47.33 01/06/21   [name], NP  multivitamin-iron-minerals-folic acid (CENTRUM) chewable tablet Chew 1 tablet by mouth daily.    [provider]  omeprazole (PRILOSEC) 20 MG capsule TAKE 1 CAPSULE (20 MG TOTAL) BY MOUTH DAILY. 12/25/21 12/25/22  [name], NP  potassium chloride SA (KLOR-CON M) 20 MEQ tablet Take 1 tablet (20 mEq total) by mouth daily for 14 days. 12/31/21 01/22/22  [name], NP  sildenafil (VIAGRA) 100 MG tablet Take 0.5-1 tablets (50-100 mg total) by mouth daily as needed for erectile dysfunction. 12/28/21   [name], NP      Allergies    Bee venom and Desyrel [trazodone hcl]    Review of Systems   Review of Systems  Constitutional:  Negative for fever.  HENT:  Positive for congestion and nosebleeds. Negative for sinus pain, sneezing and sore throat.   Respiratory:  Negative for cough.   Cardiovascular:  Negative for syncope.  Neurological:  Negative for dizziness and headaches.    Physical Exam Updated Vital Signs BP (!) 135/96 (BP Location: Right Arm)   Pulse (!) 52   Temp 98.1 F (36.7 C) (Oral)   Resp 18   SpO2 95%  Physical Exam  ED Results / Procedures / Treatments   Labs (all labs ordered are listed, but only abnormal results are displayed) Labs Reviewed - No data to display  EKG None  Radiology No results found.  Procedures .Epistaxis Management  Date/Time: 02/01/2022 2:07 PM  Performed by: Margarita Mail, PA-C Authorized by: Margarita Mail, PA-C   Consent:    Consent obtained:  Verbal   Consent given by:  Patient   Risks discussed:  Bleeding, infection, nasal injury and pain   Alternatives discussed:  No treatment Universal protocol:    Patient identity confirmed:  Verbally with  patient Anesthesia:    Anesthesia method:  Topical application   Topical anesthetic:  LET Procedure details:    Treatment site:  L anterior   Treatment method:  Anterior pack and silver nitrate   Treatment complexity:  Limited   Treatment episode: initial   Post-procedure details:    Assessment:  Bleeding stopped   Procedure completion:  Tolerated well, no immediate complications     Medications Ordered in ED Medications  oxymetazoline (AFRIN) 0.05 % nasal spray 1 spray (1 spray Each Nare Not Given 02/01/22 0914)    ED Course/ Medical Decision Making/ A&P                           Medical Decision Making Patient here with anterior epistaxis.  The nasal cavity on the left is swollen and irritated.  He appears to have a nasal polyp behind the turbinates which would suggest a history of chronic allergic rhinitis.  No active bleeding at this time.  I was able to cauterize the area of Kiesselbach's plexus on the left with resolution of all bleeding.  Patient given nasal hygiene documentation, ENT follow-up and return precautions as well as epistaxis home management care.  Patient appears otherwise appropriate for discharge at this time.  Risk OTC drugs. Prescription drug management.   =        Final Clinical Impression(s) / ED Diagnoses Final diagnoses:  None    Rx / DC Orders ED Discharge Orders     None         Margarita Mail, PA-C 02/01/22 1409    Isla Pence, MD 02/06/22 1736

## 2022-02-01 NOTE — ED Triage Notes (Signed)
Reports nosebleed since last night out of left nare. No blood thinners.  No visible bleeding at this time.

## 2022-02-04 ENCOUNTER — Other Ambulatory Visit: Payer: Self-pay

## 2022-02-04 ENCOUNTER — Emergency Department (HOSPITAL_COMMUNITY)
Admission: EM | Admit: 2022-02-04 | Discharge: 2022-02-04 | Disposition: A | Discharge status: Home / Self Care | Payer: Medicare Other | Attending: Emergency Medicine | Admitting: Emergency Medicine

## 2022-02-04 ENCOUNTER — Encounter (HOSPITAL_COMMUNITY): Payer: Self-pay | Admitting: Emergency Medicine

## 2022-02-04 ENCOUNTER — Emergency Department (HOSPITAL_COMMUNITY): Payer: Medicare Other

## 2022-02-04 DIAGNOSIS — M79675 Pain in left toe(s): Secondary | ICD-10-CM | POA: Diagnosis present

## 2022-02-04 DIAGNOSIS — M109 Gout, unspecified: Secondary | ICD-10-CM | POA: Diagnosis not present

## 2022-02-04 DIAGNOSIS — N183 Chronic kidney disease, stage 3 unspecified: Secondary | ICD-10-CM | POA: Insufficient documentation

## 2022-02-04 DIAGNOSIS — Z79899 Other long term (current) drug therapy: Secondary | ICD-10-CM | POA: Insufficient documentation

## 2022-02-04 DIAGNOSIS — I129 Hypertensive chronic kidney disease with stage 1 through stage 4 chronic kidney disease, or unspecified chronic kidney disease: Secondary | ICD-10-CM | POA: Diagnosis not present

## 2022-02-04 DIAGNOSIS — Z7982 Long term (current) use of aspirin: Secondary | ICD-10-CM | POA: Insufficient documentation

## 2022-02-04 MED ORDER — PREDNISONE 20 MG PO TABS
20.0000 mg | ORAL_TABLET | Freq: Every day | ORAL | 0 refills | Status: DC
  Filled 2022-02-04: qty 5, 5d supply, fill #0

## 2022-02-04 MED ORDER — HYDROCODONE-ACETAMINOPHEN 5-325 MG PO TABS
1.0000 | ORAL_TABLET | Freq: Once | ORAL | Status: AC
  Administered 2022-02-04: 1 via ORAL
  Filled 2022-02-04: qty 1

## 2022-02-04 MED ORDER — HYDROCODONE-ACETAMINOPHEN 5-325 MG PO TABS
1.0000 | ORAL_TABLET | ORAL | 0 refills | Status: AC | PRN
  Filled 2022-02-04: qty 10, 5d supply, fill #0

## 2022-02-04 NOTE — Discharge Instructions (Addendum)
You were evaluated in the Emergency Department and after careful evaluation, we did not find any emergent condition requiring admission or further testing in the hospital.  Your toe pain is likely due to gout.  Please see the handout for more information.  Please take the prescribed pain medication (Norco) as directed as needed.  Do not take additional Tylenol with this.  Please take the prescribed steroids as directed until finished.  Please see foods to avoid to prevent further gout flares.  Please make sure to follow-up with your primary care doctor  Please return to the Emergency Department if you experience any worsening of your condition.  Thank you for allowing [place] to be a part of your care.

## 2022-02-04 NOTE — ED Notes (Signed)
Got patient into a gown on the monitor patient is resting with call bell in reach 

## 2022-02-04 NOTE — ED Provider Notes (Signed)
[facility] EMERGENCY DEPARTMENT Provider Note   CSN: [id number] Arrival date & time: 02/04/22  0809     History  Chief Complaint  Patient presents with   Toe Pain    Corey Guerrero is a 66 y.o. male.  HPI 66 year old male with a history of hypertension, polysubstance abuse (patient states he has been sober for multiple 9 months now), CKD stage III, hyperlipidemia, sleep apnea presents to the ER with 3 days of left toe pain.  He does not recall any specific injury or fall.  He has taken extra strength Tylenol with little relief.  Denies any recent alcohol use or fatty foods.  No prior history of gout.  Denies any history of IV drug use.  No fevers or chills.  Has had pain with walking.  Pain is over the MTP joint.    Home Medications Prior to Admission medications   Medication Sig Start Date End Date Taking? Authorizing Provider  HYDROcodone-acetaminophen (NORCO/VICODIN) 5-325 MG tablet Take 1 tablet by mouth every 4 (four) hours as needed for up to 5 days. 02/04/22 02/09/22 Yes Garald Balding, PA-C  predniSONE (DELTASONE) 20 MG tablet Take 1 tablet (20 mg total) by mouth daily with breakfast. 02/04/22  Yes Sharyn Lull A, PA-C  amLODipine (NORVASC) 10 MG tablet TAKE 1 TABLET (10 MG TOTAL) BY MOUTH DAILY. 12/28/21   [name], NP  aspirin 81 MG chewable tablet Chew 1 tablet (81 mg total) by mouth daily. 12/28/21   [name], NP  atorvastatin (LIPITOR) 40 MG tablet Take 1 tablet (40 mg total) by mouth daily. 12/28/21   [name], NP  b complex vitamins capsule Take 1 capsule by mouth daily.    [provider]  carvedilol (COREG) 12.5 MG tablet Take 1 tablet (12.5 mg total) by mouth 2 (two) times daily with a meal. 12/28/21 02/07/22  [name], NP  cloNIDine (CATAPRES) 0.1 MG tablet TAKE 1 TABLET (0.1 MG TOTAL) BY MOUTH 2 (TWO) TIMES DAILY. 12/28/21 01/27/22  [name], NP  lisinopril (ZESTRIL) 20 MG tablet Take 1 tablet (20 mg total)  by mouth daily. 12/28/21 01/27/22  [name], NP  Misc. Devices MISC Please provide with insurance approved CPAP:  auto CPAP therapy for 4 to 15cm H2O  with a Large size Fisher&Paykel Nasal Mask Eson mask and heated humidification. ICD 10 G47.33 01/06/21   [name], NP  multivitamin-iron-minerals-folic acid (CENTRUM) chewable tablet Chew 1 tablet by mouth daily.    [provider]  omeprazole (PRILOSEC) 20 MG capsule TAKE 1 CAPSULE (20 MG TOTAL) BY MOUTH DAILY. 12/25/21 12/25/22  [name], NP  potassium chloride SA (KLOR-CON M) 20 MEQ tablet Take 1 tablet (20 mEq total) by mouth daily for 14 days. 12/31/21 01/22/22  [name], NP  sildenafil (VIAGRA) 100 MG tablet Take 0.5-1 tablets (50-100 mg total) by mouth daily as needed for erectile dysfunction. 12/28/21   [name], NP      Allergies    Bee venom and Desyrel [trazodone hcl]    Review of Systems   Review of Systems Ten systems reviewed and are negative for acute change, except as noted in the HPI.   Physical Exam Updated Vital Signs BP (!) 124/91 (BP Location: Right Arm)   Pulse (!) 51   Temp 98.1 F (36.7 C) (Oral)   Resp 16   SpO2 96%  Physical Exam Vitals and nursing note reviewed.  Constitutional:      General: He is not in acute distress.    Appearance: He is well-developed.  HENT:     Head: Normocephalic and atraumatic.  Eyes:     Conjunctiva/sclera: Conjunctivae normal.  Cardiovascular:     Rate and Rhythm: Normal rate and regular rhythm.     Heart sounds: No murmur heard. Pulmonary:     Effort: Pulmonary effort is normal. No respiratory distress.     Breath sounds: Normal breath sounds.  Abdominal:     Palpations: Abdomen is soft.     Tenderness: There is no abdominal tenderness.  Musculoskeletal:        General: Tenderness present. No swelling.     Cervical back: Neck supple.     Comments: Warmth to the left toe with mild erythema, pain over the MTP joint.  Flexion and  extension preserved though with some pain.  2+ DP pulses.  Foot is warm with no evidence of wounds.  Skin:    General: Skin is warm and dry.     Capillary Refill: Capillary refill takes less than 2 seconds.  Neurological:     Mental Status: He is alert.  Psychiatric:        Mood and Affect: Mood normal.     ED Results / Procedures / Treatments   Labs (all labs ordered are listed, but only abnormal results are displayed) Labs Reviewed - No data to display  EKG None  Radiology DG Foot Complete Left  Result Date: 02/04/2022 CLINICAL DATA:  Swollen left great toe. EXAM: LEFT FOOT - COMPLETE 3+ VIEW COMPARISON:  January 21, 2019 FINDINGS: There is no evidence of fracture or dislocation. There is soft tissue swelling adjacent to the first metatarsal phalangeal joint. There is bony erosion with mild sclerotic overhanging edge. IMPRESSION: Soft tissue swelling adjacent to the first metatarsophalangeal joint. There is bony erosion with mild sclerotic overhanging edge. Findings are suspicious for gout. Electronically Signed   By: Abelardo Diesel M.D.   On: 02/04/2022 08:43    Procedures Procedures    Medications Ordered in ED Medications  HYDROcodone-acetaminophen (NORCO/VICODIN) 5-325 MG per tablet 1 tablet (1 tablet Oral Given 02/04/22 0947)    ED Course/ Medical Decision Making/ A&P                           Medical Decision Making Amount and/or Complexity of Data Reviewed Radiology: ordered.  Risk Prescription drug management.  Pt presents with monoarticular pain, swelling and erythema.  Pt is afebrile and stable. Imaging reviewed, no evidence of occult fracture or injury. Low suspicion for septic joint given afebrile, no h/o IV  drug use. H/o CKD stg III. Given this, will avoid NSAIDS. Will prescribe short course of Norco. PDMP reviewed, appropriate. Given a course of steroids as well.  Encouraged PCP followup. Discussed that pt should respond to treatment with in 24 hour of begining  treatment & likely resolve in 2-3 days. Return precautions discussed. Stable for discharge.   Final Clinical Impression(s) / ED Diagnoses Final diagnoses:  Acute gout involving toe of left foot, unspecified cause    Rx / DC Orders ED Discharge Orders          Ordered    predniSONE (DELTASONE) 20 MG tablet  Daily with breakfast        02/04/22 0947    HYDROcodone-acetaminophen (NORCO/VICODIN) 5-325 MG tablet  Every 4 hours PRN  02/04/22 0950              Garald Balding, PA-C 02/04/22 4847    Godfrey Pick, MD 02/04/22 [phone]

## 2022-02-04 NOTE — ED Triage Notes (Signed)
Patient complains of left great toe pain since yesterday, denies history of gout, denies known injury. Patient is alert, oriented, and in no apparent distress at this time.

## 2022-02-05 ENCOUNTER — Ambulatory Visit: Payer: Medicare Other

## 2022-02-05 ENCOUNTER — Encounter: Payer: Self-pay | Admitting: Nurse Practitioner

## 2022-02-05 ENCOUNTER — Telehealth (HOSPITAL_BASED_OUTPATIENT_CLINIC_OR_DEPARTMENT_OTHER): Payer: Medicare Other | Admitting: Nurse Practitioner

## 2022-02-05 DIAGNOSIS — Z09 Encounter for follow-up examination after completed treatment for conditions other than malignant neoplasm: Secondary | ICD-10-CM | POA: Diagnosis not present
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
## 2022-02-06 ENCOUNTER — Ambulatory Visit: Payer: Medicare Other | Attending: Nurse Practitioner

## 2022-02-06 DIAGNOSIS — Z0000 Encounter for general adult medical examination without abnormal findings: Secondary | ICD-10-CM | POA: Diagnosis not present
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
## 2022-02-07 ENCOUNTER — Other Ambulatory Visit: Payer: Self-pay | Admitting: Nurse Practitioner

## 2022-02-07 ENCOUNTER — Other Ambulatory Visit: Payer: Self-pay | Admitting: Physician Assistant

## 2022-02-07 DIAGNOSIS — I10 Essential (primary) hypertension: Secondary | ICD-10-CM

## 2022-02-07 DIAGNOSIS — Z0100 Encounter for examination of eyes and vision without abnormal findings: Secondary | ICD-10-CM

## 2022-02-08 ENCOUNTER — Other Ambulatory Visit: Payer: Self-pay

## 2022-02-08 MED ORDER — AMLODIPINE BESYLATE 10 MG PO TABS
ORAL_TABLET | Freq: Every day | ORAL | 0 refills | Status: DC
  Filled 2022-02-08: qty 30, 30d supply, fill #0

## 2022-02-08 MED ORDER — CARVEDILOL 12.5 MG PO TABS
12.5000 mg | ORAL_TABLET | Freq: Two times a day (BID) | ORAL | 0 refills | Status: DC
  Filled 2022-02-08: qty 60, 30d supply, fill #0

## 2022-02-08 NOTE — Telephone Encounter (Signed)
Requested medication (s) are due for refill today:   Yes for both  Requested medication (s) are on the active medication list:   Yes for both  Future visit scheduled:   Yes 03/13/2022 with Zelda   Last ordered: Amlodipine 12/28/2021 #30, 0 refills;   Coreg 12/28/2021 #60, 0 refills  Returned because doesn't have enough to last until his upcoming appt. And 30 day courtesy supply given in June.   Provider to review for refill prior to upcoming appt.   Requested Prescriptions  Pending Prescriptions Disp Refills   amLODipine (NORVASC) 10 MG tablet 30 tablet 0    Sig: TAKE 1 TABLET (10 MG TOTAL) BY MOUTH DAILY.     Cardiovascular: Calcium Channel Blockers 2 Failed - 02/07/2022  7:18 PM      Failed - Last BP in normal range    BP Readings from Last 1 Encounters:  02/04/22 (!) 136/97         Passed - Last Heart Rate in normal range    Pulse Readings from Last 1 Encounters:  02/04/22 (!) 50         Passed - Valid encounter within last 6 months    Recent Outpatient Visits           3 days ago Hospital discharge follow-up   [place], Vernia Buff, NP   1 month ago Essential hypertension   [place], Vernia Buff, NP   10 months ago Encounter for annual physical exam   [place], [place] W, NP   1 year ago Essential hypertension   [place], Vernia Buff, NP   1 year ago Poorly-controlled hypertension   [place], Vernia Buff, NP       Future Appointments             In 1 month [name], NP [place]             carvedilol (COREG) 12.5 MG tablet 60 tablet 0    Sig: Take 1 tablet (12.5 mg total) by mouth 2 (two) times daily with a meal.     Cardiovascular: Beta Blockers 3 Failed - 02/07/2022  7:18 PM      Failed - Cr in normal range and within 360 days     Creat  Date Value Ref Range Status  07/02/2016 1.41 (H) 0.70 - 1.33 mg/dL Final    Comment:      For patients > or = 65 years of age: The upper reference limit for Creatinine is approximately 13% higher for people identified as African-American.      Creatinine, Ser  Date Value Ref Range Status  12/28/2021 2.10 (H) 0.76 - 1.27 mg/dL Final   Creatinine, Urine  Date Value Ref Range Status  05/22/2011 80.8 mg/dL Final         Failed - Last BP in normal range    BP Readings from Last 1 Encounters:  02/04/22 (!) 136/97         Passed - AST in normal range and within 360 days    AST  Date Value Ref Range Status  12/28/2021 39 0 - 40 IU/L Final         Passed - ALT in normal range and within 360 days    ALT  Date Value Ref Range Status  12/28/2021  35 0 - 44 IU/L Final         Passed - Last Heart Rate in normal range    Pulse Readings from Last 1 Encounters:  02/04/22 (!) 50         Passed - Valid encounter within last 6 months    Recent Outpatient Visits           3 days ago Hospital discharge follow-up   [place], Vernia Buff, NP   1 month ago Essential hypertension   [place], Vernia Buff, NP   10 months ago Encounter for annual physical exam   [place], Vernia Buff, NP   1 year ago Essential hypertension   [place], Vernia Buff, NP   1 year ago Poorly-controlled hypertension   [place], Vernia Buff, NP       Future Appointments             In 1 month [name], NP [place]

## 2022-02-12 ENCOUNTER — Other Ambulatory Visit: Payer: Self-pay

## 2022-03-05 ENCOUNTER — Ambulatory Visit: Payer: Self-pay | Admitting: Licensed Clinical Social Worker

## 2022-03-06 ENCOUNTER — Other Ambulatory Visit: Payer: Self-pay

## 2022-03-06 NOTE — Patient Outreach (Signed)
  Care Coordination   Initial Visit Note   03/06/2022 Name: Corey Guerrero MRN: [id number] DOB: May 23, 1956  Corey Guerrero is a 66 y.o. year old male who sees [name], NP for primary care. I spoke with  Corey Guerrero by phone today.  What matters to the patients health and wellness today?  Pt was informed of Care Coordination Services. Pt not in need at this time    Goals Addressed             This Visit's Progress    COMPLETED: Care Coordination Activities-No Follow Up Required       Care Coordination Interventions: Active listening / Reflection utilized  Emotional Support Provided Verbalization of feelings encouraged  LCSW informed pt of care coordination services to assist with management of health conditions. Pt is not interested at this time LCSW reviewed upcoming appts and reminded him that he is due a flu shot (01/29/22) Pt agreed to discuss with PCP at upcoming appt         SDOH assessments and interventions completed:  Yes  SDOH Interventions Today    Flowsheet Row Most Recent Value  SDOH Interventions   Housing Interventions Intervention Not Indicated        Care Coordination Interventions Activated:  Yes  Care Coordination Interventions:  Yes, provided   Follow up plan: No further intervention required.   Encounter Outcome:  Pt. Visit Completed   Christa See, MSW, [email or web address] Phone [phone] 1:18 AM

## 2022-03-06 NOTE — Patient Instructions (Signed)
Visit Information  Thank you for taking time to visit with me today. Please don't hesitate to contact me if I can be of assistance to you.   Following are the goals we discussed today:   Goals Addressed             This Visit's Progress    COMPLETED: Care Coordination Activities-No Follow Up Required       Care Coordination Interventions: Active listening / Reflection utilized  Emotional Support Provided Verbalization of feelings encouraged  LCSW informed pt of care coordination services to assist with management of health conditions. Pt is not interested at this time LCSW reviewed upcoming appts and reminded him that he is due a flu shot (01/29/22) Pt agreed to discuss with PCP at upcoming appt         If you are experiencing a Mental Health or [place] or need someone to talk to, please call the Suicide and Crisis Lifeline: 988 call 911   Patient verbalizes understanding of instructions and care plan provided today and agrees to view in [place]. Active MyChart status and patient understanding of how to access instructions and care plan via MyChart confirmed with patient.     No further follow up required:    Christa See, MSW, [email or web address] Phone [phone] 1:18 AM

## 2022-03-07 ENCOUNTER — Other Ambulatory Visit: Payer: Self-pay

## 2022-03-07 ENCOUNTER — Other Ambulatory Visit: Payer: Self-pay | Admitting: Family Medicine

## 2022-03-07 DIAGNOSIS — I10 Essential (primary) hypertension: Secondary | ICD-10-CM

## 2022-03-07 MED ORDER — AMLODIPINE BESYLATE 10 MG PO TABS
ORAL_TABLET | Freq: Every day | ORAL | 0 refills | Status: DC
  Filled 2022-03-07: qty 30, 30d supply, fill #0

## 2022-03-07 MED ORDER — CARVEDILOL 12.5 MG PO TABS
12.5000 mg | ORAL_TABLET | Freq: Two times a day (BID) | ORAL | 0 refills | Status: DC
  Filled 2022-03-07: qty 60, 30d supply, fill #0

## 2022-03-12 ENCOUNTER — Other Ambulatory Visit: Payer: Self-pay

## 2022-03-13 ENCOUNTER — Ambulatory Visit: Payer: Medicare Other | Admitting: Nurse Practitioner

## 2022-03-30 ENCOUNTER — Other Ambulatory Visit: Payer: Self-pay | Admitting: Nurse Practitioner

## 2022-03-30 DIAGNOSIS — I10 Essential (primary) hypertension: Secondary | ICD-10-CM

## 2022-04-01 ENCOUNTER — Other Ambulatory Visit: Payer: Self-pay

## 2022-04-01 MED ORDER — CLONIDINE HCL 0.1 MG PO TABS
0.1000 mg | ORAL_TABLET | Freq: Two times a day (BID) | ORAL | 0 refills | Status: DC
  Filled 2022-04-01: qty 180, 30d supply, fill #0
  Filled 2022-04-08: qty 180, 90d supply, fill #0

## 2022-04-01 MED ORDER — AMLODIPINE BESYLATE 10 MG PO TABS
10.0000 mg | ORAL_TABLET | Freq: Every day | ORAL | 0 refills | Status: DC
  Filled 2022-04-01: qty 90, fill #0
  Filled 2022-04-08: qty 90, 90d supply, fill #0

## 2022-04-01 MED ORDER — LISINOPRIL 20 MG PO TABS
20.0000 mg | ORAL_TABLET | Freq: Every day | ORAL | 0 refills | Status: DC
  Filled 2022-04-01 – 2022-04-08 (×2): qty 90, 90d supply, fill #0

## 2022-04-01 MED ORDER — CARVEDILOL 12.5 MG PO TABS
12.5000 mg | ORAL_TABLET | Freq: Two times a day (BID) | ORAL | 0 refills | Status: DC
  Filled 2022-04-01 – 2022-04-08 (×2): qty 180, 90d supply, fill #0

## 2022-04-01 NOTE — Telephone Encounter (Signed)
Requested Prescriptions  Pending Prescriptions Disp Refills  . cloNIDine (CATAPRES) 0.1 MG tablet 180 tablet 0    Sig: TAKE 1 TABLET (0.1 MG TOTAL) BY MOUTH 2 (TWO) TIMES DAILY.     Cardiovascular:  Alpha-2 Agonists Failed - 03/30/2022  7:58 AM      Failed - Last BP in normal range    BP Readings from Last 1 Encounters:  02/04/22 (!) 136/97         Passed - Last Heart Rate in normal range    Pulse Readings from Last 1 Encounters:  02/04/22 (!) 50         Passed - Valid encounter within last 6 months    Recent Outpatient Visits          1 month ago Hospital discharge follow-up   [place], Vernia Buff, NP   3 months ago Essential hypertension   [place], Vernia Buff, NP   12 months ago Encounter for annual physical exam   [place], Vernia Buff, NP   1 year ago Essential hypertension   [place], Vernia Buff, NP   1 year ago Poorly-controlled hypertension   [place], Vernia Buff, NP      Future Appointments            In 1 week [name], NP [place]           . lisinopril (ZESTRIL) 20 MG tablet 90 tablet 0    Sig: Take 1 tablet (20 mg total) by mouth daily.     Cardiovascular:  ACE Inhibitors Failed - 03/30/2022  7:58 AM      Failed - Cr in normal range and within 180 days    Creat  Date Value Ref Range Status  07/02/2016 1.41 (H) 0.70 - 1.33 mg/dL Final    Comment:      For patients > or = 65 years of age: The upper reference limit for Creatinine is approximately 13% higher for people identified as African-American.      Creatinine, Ser  Date Value Ref Range Status  12/28/2021 2.10 (H) 0.76 - 1.27 mg/dL Final   Creatinine, Urine  Date Value Ref Range Status  05/22/2011 80.8 mg/dL Final         Failed - K in normal range and within 180  days    Potassium  Date Value Ref Range Status  12/28/2021 3.2 (L) 3.5 - 5.2 mmol/L Final         Failed - Last BP in normal range    BP Readings from Last 1 Encounters:  02/04/22 (!) 136/97         Passed - Patient is not pregnant      Passed - Valid encounter within last 6 months    Recent Outpatient Visits          1 month ago Hospital discharge follow-up   [place], Vernia Buff, NP   3 months ago Essential hypertension   [place], Vernia Buff, NP   12 months ago Encounter for annual physical exam   [place], Vernia Buff, NP   1 year ago Essential hypertension   [place], Vernia Buff, NP   1 year  ago Poorly-controlled hypertension   [place], Vernia Buff, NP      Future Appointments            In 1 week [name], NP [place]           . amLODipine (NORVASC) 10 MG tablet 90 tablet 0    Sig: TAKE 1 TABLET (10 MG TOTAL) BY MOUTH DAILY.     Cardiovascular: Calcium Channel Blockers 2 Failed - 03/30/2022  7:58 AM      Failed - Last BP in normal range    BP Readings from Last 1 Encounters:  02/04/22 (!) 136/97         Passed - Last Heart Rate in normal range    Pulse Readings from Last 1 Encounters:  02/04/22 (!) 50         Passed - Valid encounter within last 6 months    Recent Outpatient Visits          1 month ago Hospital discharge follow-up   [place], Vernia Buff, NP   3 months ago Essential hypertension   [place], Vernia Buff, NP   12 months ago Encounter for annual physical exam   [place], Vernia Buff, NP   1 year ago Essential hypertension   [place], Vernia Buff, NP   1 year ago  Poorly-controlled hypertension   [place], Vernia Buff, NP      Future Appointments            In 1 week [name], NP [place]           . carvedilol (COREG) 12.5 MG tablet 180 tablet 0    Sig: Take 1 tablet (12.5 mg total) by mouth 2 (two) times daily with a meal.     Cardiovascular: Beta Blockers 3 Failed - 03/30/2022  7:58 AM      Failed - Cr in normal range and within 360 days    Creat  Date Value Ref Range Status  07/02/2016 1.41 (H) 0.70 - 1.33 mg/dL Final    Comment:      For patients > or = 65 years of age: The upper reference limit for Creatinine is approximately 13% higher for people identified as African-American.      Creatinine, Ser  Date Value Ref Range Status  12/28/2021 2.10 (H) 0.76 - 1.27 mg/dL Final   Creatinine, Urine  Date Value Ref Range Status  05/22/2011 80.8 mg/dL Final         Failed - Last BP in normal range    BP Readings from Last 1 Encounters:  02/04/22 (!) 136/97         Passed - AST in normal range and within 360 days    AST  Date Value Ref Range Status  12/28/2021 39 0 - 40 IU/L Final         Passed - ALT in normal range and within 360 days    ALT  Date Value Ref Range Status  12/28/2021 35 0 - 44 IU/L Final         Passed - Last Heart Rate in normal range    Pulse Readings from Last 1 Encounters:  02/04/22 (!) 50  Passed - Valid encounter within last 6 months    Recent Outpatient Visits          1 month ago Hospital discharge follow-up   [place] [name], NP   3 months ago Essential hypertension   [place], Zelda W, NP   12 months ago Encounter for annual physical exam   [place], Vernia Buff, NP   1 year ago Essential hypertension   [place], Vernia Buff, NP   1 year ago  Poorly-controlled hypertension   [place], Vernia Buff, NP      Future Appointments            In 1 week [name], NP [place]

## 2022-04-03 ENCOUNTER — Other Ambulatory Visit: Payer: Self-pay

## 2022-04-08 ENCOUNTER — Other Ambulatory Visit: Payer: Self-pay

## 2022-04-09 ENCOUNTER — Other Ambulatory Visit: Payer: Self-pay

## 2022-04-09 ENCOUNTER — Ambulatory Visit: Payer: Medicare Other | Admitting: Nurse Practitioner

## 2022-05-07 ENCOUNTER — Other Ambulatory Visit: Payer: Self-pay

## 2022-05-07 ENCOUNTER — Other Ambulatory Visit: Payer: Self-pay | Admitting: Nurse Practitioner

## 2022-05-07 DIAGNOSIS — K219 Gastro-esophageal reflux disease without esophagitis: Secondary | ICD-10-CM

## 2022-05-07 MED ORDER — OMEPRAZOLE 20 MG PO CPDR
20.0000 mg | DELAYED_RELEASE_CAPSULE | Freq: Every day | ORAL | 0 refills | Status: DC
  Filled 2022-05-07: qty 90, 90d supply, fill #0

## 2022-05-09 ENCOUNTER — Other Ambulatory Visit: Payer: Self-pay

## 2022-05-09 ENCOUNTER — Ambulatory Visit: Payer: Medicare Other | Attending: Nurse Practitioner | Admitting: Internal Medicine

## 2022-05-09 ENCOUNTER — Encounter: Payer: Self-pay | Admitting: Internal Medicine

## 2022-05-09 VITALS — BP 128/81 | HR 45 | Temp 97.6°F | Ht 66.0 in | Wt 189.0 lb

## 2022-05-09 DIAGNOSIS — Z23 Encounter for immunization: Secondary | ICD-10-CM | POA: Diagnosis not present

## 2022-05-09 DIAGNOSIS — E669 Obesity, unspecified: Secondary | ICD-10-CM | POA: Diagnosis not present

## 2022-05-09 DIAGNOSIS — Z2821 Immunization not carried out because of patient refusal: Secondary | ICD-10-CM

## 2022-05-09 DIAGNOSIS — I10 Essential (primary) hypertension: Secondary | ICD-10-CM | POA: Diagnosis not present

## 2022-05-09 DIAGNOSIS — N1832 Chronic kidney disease, stage 3b: Secondary | ICD-10-CM

## 2022-05-09 DIAGNOSIS — E785 Hyperlipidemia, unspecified: Secondary | ICD-10-CM

## 2022-05-09 DIAGNOSIS — R7303 Prediabetes: Secondary | ICD-10-CM

## 2022-05-09 LAB — GLUCOSE, POCT (MANUAL RESULT ENTRY): POC Glucose: 121 mg/dl — AB (ref 70–99)

## 2022-05-09 MED ORDER — CLONIDINE HCL 0.1 MG PO TABS
0.1000 mg | ORAL_TABLET | Freq: Two times a day (BID) | ORAL | 0 refills | Status: DC
  Filled 2022-05-09 – 2022-07-10 (×2): qty 180, 90d supply, fill #0

## 2022-05-09 MED ORDER — LISINOPRIL 20 MG PO TABS
20.0000 mg | ORAL_TABLET | Freq: Every day | ORAL | 0 refills | Status: DC
  Filled 2022-05-09 – 2022-07-10 (×2): qty 90, 90d supply, fill #0

## 2022-05-09 MED ORDER — ATORVASTATIN CALCIUM 40 MG PO TABS
40.0000 mg | ORAL_TABLET | Freq: Every day | ORAL | 0 refills | Status: DC
  Filled 2022-05-09 – 2022-06-29 (×2): qty 90, 90d supply, fill #0

## 2022-05-09 MED ORDER — CARVEDILOL 12.5 MG PO TABS
12.5000 mg | ORAL_TABLET | Freq: Two times a day (BID) | ORAL | 0 refills | Status: DC
  Filled 2022-05-09 – 2022-06-29 (×2): qty 180, 90d supply, fill #0

## 2022-05-09 MED ORDER — AMLODIPINE BESYLATE 10 MG PO TABS
10.0000 mg | ORAL_TABLET | Freq: Every day | ORAL | 0 refills | Status: DC
  Filled 2022-05-09 – 2022-06-29 (×2): qty 90, 90d supply, fill #0

## 2022-05-09 NOTE — Progress Notes (Addendum)
Patient ID: RENAN Guerrero, male    DOB: 05-Nov-1955  MRN: [id number]  CC: Hypertension (HTN f/u. Velta Addison to flu vax.)   Subjective: Corey Guerrero is a 66 y.o. male who presents for f/u HTN.  PCP is NP Raul Del.  Last visit with her [id number] His concerns today include:  He has a past medical history of HTN, HL, ED, CKD stage 3,  gout, and Sleep apnea.    HYPERTENSION/CKD Currently taking: see medication list.  Patient is supposed to be on amlodipine 10 mg daily, carvedilol 12.5 mg twice a day, clonidine 0.1 mg twice a day and lisinopril 20 mg daily. Med Adherence: [x]  Yes    []  No Medication side effects: [x]  Yes  -ED Adherence with salt restriction: [x]  Yes    []  No Home Monitoring?: [x]  Yes    []  No Monitoring Frequency: 1-2x/wk Home BP results range: does not recall his range. Forgot to write down SOB? []  Yes    [x]  No Chest Pain?: []  Yes    [x]  No Leg swelling?: []  Yes    [x]  No Headaches?: []  Yes    [x]  No Dizziness? []  Yes    [x]  No Comments: GFR has been in the 30s.  Last creatinine was 2.1 HL: reports compliance with checking lipitor.  History of prediabetes.  Patient states he is working on trying to get his weight down.  He does a lot of walking every day.  Working on improving his eating habits.  He loves bread.  He tries to drink mainly water.  Usually bake his meats.  HM:  agree for flu vaccine.  Declines pneumonia vaccine  Patient Active Problem List   Diagnosis Date Noted   Aortic atherosclerosis ([place]) 01/15/2022   Mixed hyperlipidemia 01/15/2022   Obstructive sleep apnea 09/11/2021   Accelerated hypertension 03/23/2020   Chronic kidney disease (CKD), stage IV (severe) ([place]) 08/18/2018   Noncompliance with medications    Poorly-controlled hypertension    CKD (chronic kidney disease) stage 3, GFR 30-59 ml/min ([place])    Hypertensive emergency 02/25/2018   Hypertensive urgency, malignant 02/24/2018   Strain of calf muscle, initial encounter 06/14/2016   Intractable  tension-type headache 04/01/2016   Acute allergic rhinitis 04/01/2016   Bradycardia 04/28/2015   Muscle right arm weakness    Olecranon bursitis of right elbow 01/19/2015   Erectile dysfunction 01/19/2015   CKD (chronic kidney disease) stage 2, GFR 60-89 ml/min 10/15/2011   Essential hypertension 05/20/2011   Hypokalemia 05/20/2011   Abnormal EKG 05/20/2011     Current Outpatient Medications on File Prior to Visit  Medication Sig Dispense Refill   aspirin 81 MG chewable tablet Chew 1 tablet (81 mg total) by mouth daily. 108 tablet 1   b complex vitamins capsule Take 1 capsule by mouth daily.     Misc. Devices MISC Please provide with insurance approved CPAP:  auto CPAP therapy for 4 to 15cm H2O  with a Large size Fisher[employer]Paykel Nasal Mask [place] mask and heated humidification. ICD 10 G47.33 1 each 0   multivitamin-iron-minerals-folic acid (CENTRUM) chewable tablet Chew 1 tablet by mouth daily.     omeprazole (PRILOSEC) 20 MG capsule Take 1 capsule (20 mg total) by mouth daily. 90 capsule 0   potassium chloride SA (KLOR-CON M) 20 MEQ tablet Take 1 tablet (20 mEq total) by mouth daily for 14 days. 14 tablet 0   predniSONE (DELTASONE) 20 MG tablet Take 1 tablet (20 mg total) by mouth daily with breakfast. 5  tablet 0   sildenafil (VIAGRA) 100 MG tablet Take 0.5-1 tablets (50-100 mg total) by mouth daily as needed for erectile dysfunction. 5 tablet 11   No current facility-administered medications on file prior to visit.    Allergies  Allergen Reactions   Bee Venom Anaphylaxis   Desyrel [Trazodone Hcl] Anaphylaxis and Shortness Of Breath    Patient STOPPED BREATHING!!    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   Drug use: Not Currently    Types: Cocaine, Marijuana    Comment: Last use December 01 2011   Sexual activity: Yes  Other Topics Concern   Not on file  Social History Narrative   Has a daughter as well as a new 59-month old granddaughter   Social Determinants of Radio broadcast assistant Strain: Not on file  Food Insecurity: Not on file  Transportation Needs: Not on file  Physical Activity: Not on file  Stress: Not on file  Social Connections: Not on file  Intimate Partner Violence: Not on file    Family History  Problem Relation Age of Onset   Hypertension Other    Colon cancer Neg Hx    Colon polyps Neg Hx    Esophageal cancer Neg Hx    Rectal cancer Neg Hx    Stomach cancer Neg Hx     Past Surgical History:  Procedure Laterality Date   BRAIN SURGERY     Benig tumor   DRUG INDUCED ENDOSCOPY N/A 09/11/2021   Procedure: DRUG INDUCED SLEEP ENDOSCOPY;  Surgeon: Jerrell Belfast, MD;  Location: [facility];  Service: ENT;  Laterality: N/A;   HAND SURGERY Right    SALIVARY GLAND SURGERY     TRICEPS TENDON REPAIR Left 10/05/2021   Procedure: TRICEPS  REPAIR;  Surgeon: [name], MD;  Location: [facility];  Service: Orthopedics;  Laterality: Left;    ROS: Review of Systems Negative except as stated above  PHYSICAL EXAM: BP 128/81 (BP Location: Left Arm, Patient Position: Sitting, Cuff Size: Normal)   Pulse (!) 45   Temp 97.6 F (36.4 C) (Oral)   Ht 5\' 6"  (1.676 m)   Wt 189 lb (85.7 kg)   SpO2 97%   BMI 30.51 kg/m   Physical Exam  General appearance - alert, well appearing, older AAM and in no distress Mental status - normal mood, behavior, speech, dress, motor activity, and thought processes Neck - supple, no significant adenopathy Chest - clear to  auscultation, no wheezes, rales or rhonchi, symmetric air entry Heart - normal rate, regular rhythm, normal S1, S2, no murmurs, rubs, clicks or gallops Extremities - peripheral pulses normal, no pedal edema, no clubbing or cyanosis  Results for orders placed or performed in visit on 05/09/22  POCT glucose (manual entry)  Result Value Ref Range   POC Glucose 121 (A) 70 - 99 mg/dl  This is fasting BS     Latest Ref  Rng & Units 12/28/2021    3:29 PM 10/05/2021    8:10 AM 09/06/2021    2:30 PM  CMP  Glucose 70 - 99 mg/dL 101  106  88   BUN 8 - 27 mg/dL 33  31  33   Creatinine 0.76 - 1.27 mg/dL 2.10  2.20  2.22   Sodium 134 - 144 mmol/L 144  137  140   Potassium 3.5 - 5.2 mmol/L 3.2  3.4  3.5   Chloride 96 - 106 mmol/L 100  103  102   CO2 20 - 29 mmol/L 28  24  28    Calcium 8.6 - 10.2 mg/dL 9.7  9.5  8.5   Total Protein 6.0 - 8.5 g/dL 7.5     Total Bilirubin 0.0 - 1.2 mg/dL 0.7     Alkaline Phos 44 - 121 IU/L 70     AST 0 - 40 IU/L 39     ALT 0 - 44 IU/L 35      Lipid Panel     Component Value Date/Time   CHOL 102 04/06/2021 1530   TRIG 152 (H) 04/06/2021 1530   HDL 33 (L) 04/06/2021 1530   CHOLHDL 3.1 04/06/2021 1530   CHOLHDL 4.4 04/17/2015 1830   VLDL 14 04/17/2015 1830   LDLCALC 43 04/06/2021 1530    CBC    Component Value Date/Time   WBC 5.8 10/05/2021 0810   RBC 4.93 10/05/2021 0810   HGB 15.9 10/05/2021 0810   HGB 15.8 11/19/2019 1040   HCT 46.4 10/05/2021 0810   HCT 46.4 11/19/2019 1040   PLT 169 10/05/2021 0810   PLT 160 11/19/2019 1040   MCV 94.1 10/05/2021 0810   MCV 92 11/19/2019 1040   MCH 32.3 10/05/2021 0810   MCHC 34.3 10/05/2021 0810   RDW 12.4 10/05/2021 0810   RDW 13.1 11/19/2019 1040   LYMPHSABS 2.3 03/14/2018 0237   MONOABS 0.3 03/14/2018 0237   EOSABS 0.1 03/14/2018 0237   BASOSABS 0.1 03/14/2018 0237    ASSESSMENT AND PLAN: 1. Essential hypertension Close to goal.  Continue current medications listed above. - amLODipine (NORVASC) 10 MG  tablet; Take 1 tablet (10 mg total) by mouth daily.  Dispense: 90 tablet; Refill: 0 - carvedilol (COREG) 12.5 MG tablet; Take 1 tablet (12.5 mg total) by mouth 2 (two) times daily with a meal.  Dispense: 180 tablet; Refill: 0 - cloNIDine (CATAPRES) 0.1 MG tablet; Take 1 tablet (0.1 mg total) by mouth 2 (two) times daily.  Dispense: 180 tablet; Refill: 0 - lisinopril (ZESTRIL) 20 MG tablet; Take 1 tablet (20 mg total) by mouth daily.  Dispense: 90 tablet; Refill: 0  2. Dyslipidemia Continue atorvastatin - atorvastatin (LIPITOR) 40 MG tablet; Take 1 tablet (40 mg total) by mouth daily.  Dispense: 90 tablet; Refill: 0  3. Obesity (BMI 30.0-34.9) 4. Prediabetes Patient advised to eliminate sugary drinks from the diet, cut back on portion sizes especially of white carbohydrates, eat more white lean meat like chicken [place] and seafood instead of beef or pork and incorporate fresh fruits and vegetables into the diet daily. Encouraged him to continue regular exercise. - POCT glucose (manual entry) - Hemoglobin A1c  5. Stage 3b chronic kidney disease ([place]) Discussed importance of good blood pressure control and helping to maintain kidney function - Basic Metabolic Panel  6. Need for influenza vaccination - Flu Vaccine QUAD High Dose(Fluad)  7. Pneumococcal vaccination declined    Patient was given the opportunity to ask questions.  Patient verbalized understanding of the plan and was able to repeat key elements of the plan.   This documentation was completed using Radio producer.  Any transcriptional errors are unintentional.  Orders Placed This Encounter  Procedures   Flu Vaccine QUAD High Dose(Fluad)   Basic Metabolic Panel   Hemoglobin A1c   POCT glucose (manual entry)     Requested Prescriptions   Signed Prescriptions Disp Refills   amLODipine (NORVASC) 10 MG tablet 90 tablet 0    Sig: Take 1 tablet (10 mg total) by mouth daily.   atorvastatin (LIPITOR)  40 MG tablet 90 tablet 0    Sig: Take 1 tablet (40 mg total) by mouth daily.   carvedilol (COREG) 12.5 MG tablet 180 tablet 0    Sig: Take 1 tablet (12.5 mg total) by mouth 2 (two) times daily with a meal.   cloNIDine (CATAPRES) 0.1 MG tablet 180 tablet 0    Sig: Take 1 tablet (0.1 mg total) by mouth 2 (two) times daily.   lisinopril (ZESTRIL) 20 MG tablet 90 tablet 0    Sig: Take 1 tablet (20 mg total) by mouth daily.    Return in about 3 months (around 08/09/2022) for Give f/u with his PCP Geryl Rankins.  Karle Plumber, MD, FACP

## 2022-05-09 NOTE — Patient Instructions (Signed)

## 2022-05-10 LAB — BASIC METABOLIC PANEL
BUN/Creatinine Ratio: 19 (ref 10–24)
BUN: 32 mg/dL — ABNORMAL HIGH (ref 8–27)
CO2: 23 mmol/L (ref 20–29)
Calcium: 9.2 mg/dL (ref 8.6–10.2)
Chloride: 103 mmol/L (ref 96–106)
Creatinine, Ser: 1.71 mg/dL — ABNORMAL HIGH (ref 0.76–1.27)
Glucose: 85 mg/dL (ref 70–99)
Potassium: 3.5 mmol/L (ref 3.5–5.2)
Sodium: 145 mmol/L — ABNORMAL HIGH (ref 134–144)
eGFR: 44 mL/min/{1.73_m2} — ABNORMAL LOW (ref >59)

## 2022-05-10 LAB — HEMOGLOBIN A1C
Est. average glucose Bld gHb Est-mCnc: 123 mg/dL
Hgb A1c MFr Bld: 5.9 % — ABNORMAL HIGH (ref 4.8–5.6)

## 2022-06-29 ENCOUNTER — Other Ambulatory Visit: Payer: Self-pay

## 2022-07-01 ENCOUNTER — Other Ambulatory Visit: Payer: Self-pay

## 2022-07-03 ENCOUNTER — Other Ambulatory Visit: Payer: Self-pay

## 2022-07-10 ENCOUNTER — Other Ambulatory Visit: Payer: Self-pay

## 2022-07-12 ENCOUNTER — Other Ambulatory Visit: Payer: Self-pay

## 2022-07-17 ENCOUNTER — Other Ambulatory Visit (HOSPITAL_COMMUNITY)
Admission: RE | Admit: 2022-07-17 | Discharge: 2022-07-17 | Disposition: A | Discharge status: Home / Self Care | Payer: 59 | Source: Ambulatory Visit | Attending: Nurse Practitioner | Admitting: Nurse Practitioner

## 2022-07-17 ENCOUNTER — Other Ambulatory Visit: Payer: Self-pay

## 2022-07-17 ENCOUNTER — Encounter: Payer: Self-pay | Admitting: Nurse Practitioner

## 2022-07-17 ENCOUNTER — Ambulatory Visit: Payer: 59 | Attending: Nurse Practitioner | Admitting: Nurse Practitioner

## 2022-07-17 VITALS — BP 138/86 | HR 50 | Ht 66.0 in | Wt 188.0 lb

## 2022-07-17 DIAGNOSIS — R350 Frequency of micturition: Secondary | ICD-10-CM | POA: Diagnosis present

## 2022-07-17 DIAGNOSIS — R0981 Nasal congestion: Secondary | ICD-10-CM

## 2022-07-17 DIAGNOSIS — I10 Essential (primary) hypertension: Secondary | ICD-10-CM

## 2022-07-17 DIAGNOSIS — E785 Hyperlipidemia, unspecified: Secondary | ICD-10-CM

## 2022-07-17 DIAGNOSIS — K4090 Unilateral inguinal hernia, without obstruction or gangrene, not specified as recurrent: Secondary | ICD-10-CM

## 2022-07-17 DIAGNOSIS — K219 Gastro-esophageal reflux disease without esophagitis: Secondary | ICD-10-CM

## 2022-07-17 LAB — POCT URINALYSIS DIP (CLINITEK)
Bilirubin, UA: NEGATIVE
Glucose, UA: NEGATIVE mg/dL
Ketones, POC UA: NEGATIVE mg/dL
Leukocytes, UA: NEGATIVE
Nitrite, UA: NEGATIVE
POC PROTEIN,UA: >=300 — AB
Spec Grav, UA: 1.025 (ref 1.010–1.025)
Urobilinogen, UA: 1 E.U./dL
pH, UA: 5.5 (ref 5.0–8.0)

## 2022-07-17 MED ORDER — CLONIDINE HCL 0.1 MG PO TABS
0.1000 mg | ORAL_TABLET | Freq: Two times a day (BID) | ORAL | 1 refills | Status: DC
  Filled 2022-07-17 – 2022-09-28 (×2): qty 180, 90d supply, fill #0
  Filled 2023-01-07: qty 180, 90d supply, fill #1

## 2022-07-17 MED ORDER — FLUTICASONE PROPIONATE 50 MCG/ACT NA SUSP
2.0000 | Freq: Every day | NASAL | 0 refills | Status: DC
  Filled 2022-07-17: qty 16, 30d supply, fill #0

## 2022-07-17 MED ORDER — AMLODIPINE BESYLATE 10 MG PO TABS
10.0000 mg | ORAL_TABLET | Freq: Every day | ORAL | 1 refills | Status: DC
  Filled 2022-07-17 – 2022-09-28 (×2): qty 90, 90d supply, fill #0
  Filled 2022-12-26: qty 90, 90d supply, fill #1

## 2022-07-17 MED ORDER — OMEPRAZOLE 20 MG PO CPDR
20.0000 mg | DELAYED_RELEASE_CAPSULE | Freq: Every day | ORAL | 1 refills | Status: DC
  Filled 2022-07-17 – 2022-08-04 (×2): qty 90, 90d supply, fill #0
  Filled 2022-10-31: qty 90, 90d supply, fill #1

## 2022-07-17 MED ORDER — CARVEDILOL 12.5 MG PO TABS
12.5000 mg | ORAL_TABLET | Freq: Two times a day (BID) | ORAL | 1 refills | Status: DC
  Filled 2022-07-17 – 2022-10-01 (×2): qty 180, 90d supply, fill #0
  Filled 2022-12-26: qty 180, 90d supply, fill #1

## 2022-07-17 MED ORDER — ASPIRIN 81 MG PO CHEW
81.0000 mg | CHEWABLE_TABLET | Freq: Every day | ORAL | 1 refills | Status: DC
  Filled 2022-07-17 – 2022-09-28 (×3): qty 108, 108d supply, fill #0
  Filled 2023-05-22: qty 90, 90d supply, fill #1
  Filled 2023-06-09: qty 108, 108d supply, fill #1

## 2022-07-17 MED ORDER — LISINOPRIL 20 MG PO TABS
20.0000 mg | ORAL_TABLET | Freq: Every day | ORAL | 1 refills | Status: DC
  Filled 2022-07-17 – 2022-09-28 (×3): qty 90, 90d supply, fill #0
  Filled 2022-12-26: qty 90, 90d supply, fill #1

## 2022-07-17 MED ORDER — ATORVASTATIN CALCIUM 40 MG PO TABS
40.0000 mg | ORAL_TABLET | Freq: Every day | ORAL | 1 refills | Status: DC
  Filled 2022-07-17 – 2022-10-01 (×2): qty 90, 90d supply, fill #0
  Filled 2022-12-26: qty 90, 90d supply, fill #1
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
## 2022-07-17 NOTE — Progress Notes (Signed)
Patient is experiencing swelling when needing to urinate. Its located above his groin area on the left side. Patient states the swelling goes down after urinating.  Onset 2 weeks ago.

## 2022-07-18 LAB — CMP14+EGFR
ALT: 38 IU/L (ref 0–44)
AST: 43 IU/L — ABNORMAL HIGH (ref 0–40)
Albumin/Globulin Ratio: 1.3 (ref 1.2–2.2)
Albumin: 4.1 g/dL (ref 3.9–4.9)
Alkaline Phosphatase: 80 IU/L (ref 44–121)
BUN/Creatinine Ratio: 13 (ref 10–24)
BUN: 20 mg/dL (ref 8–27)
Bilirubin Total: 0.8 mg/dL (ref 0.0–1.2)
CO2: 21 mmol/L (ref 20–29)
Calcium: 9 mg/dL (ref 8.6–10.2)
Chloride: 102 mmol/L (ref 96–106)
Creatinine, Ser: 1.53 mg/dL — ABNORMAL HIGH (ref 0.76–1.27)
Globulin, Total: 3.1 g/dL (ref 1.5–4.5)
Glucose: 117 mg/dL — ABNORMAL HIGH (ref 70–99)
Potassium: 3.7 mmol/L (ref 3.5–5.2)
Sodium: 140 mmol/L (ref 134–144)
Total Protein: 7.2 g/dL (ref 6.0–8.5)
eGFR: 50 mL/min/{1.73_m2} — ABNORMAL LOW (ref >59)

## 2022-07-18 LAB — URINALYSIS, COMPLETE
Bilirubin, UA: NEGATIVE
Glucose, UA: NEGATIVE
Ketones, UA: NEGATIVE
Leukocytes,UA: NEGATIVE
Nitrite, UA: NEGATIVE
RBC, UA: NEGATIVE
Specific Gravity, UA: 1.024 (ref 1.005–1.030)
Urobilinogen, Ur: 1 mg/dL (ref 0.2–1.0)
pH, UA: 6 (ref 5.0–7.5)

## 2022-07-18 LAB — MICROSCOPIC EXAMINATION
Bacteria, UA: NONE SEEN
Casts: NONE SEEN /lpf
Epithelial Cells (non renal): NONE SEEN /hpf (ref 0–10)
RBC, Urine: NONE SEEN /hpf (ref 0–2)
WBC, UA: NONE SEEN /hpf (ref 0–5)

## 2022-07-19 LAB — URINE CYTOLOGY ANCILLARY ONLY
Bacterial Vaginitis-Urine: NEGATIVE
Candida Urine: NEGATIVE
Chlamydia: NEGATIVE
Comment: NEGATIVE
Comment: NEGATIVE
Comment: NORMAL
Neisseria Gonorrhea: NEGATIVE
Trichomonas: NEGATIVE

## 2022-07-24 ENCOUNTER — Ambulatory Visit
Admission: RE | Admit: 2022-07-24 | Discharge: 2022-07-24 | Disposition: A | Discharge status: Home / Self Care | Payer: 59 | Source: Ambulatory Visit | Attending: Nurse Practitioner | Admitting: Nurse Practitioner

## 2022-07-24 DIAGNOSIS — K4090 Unilateral inguinal hernia, without obstruction or gangrene, not specified as recurrent: Secondary | ICD-10-CM

## 2022-07-31 ENCOUNTER — Encounter: Payer: Self-pay | Admitting: Nurse Practitioner

## 2022-07-31 ENCOUNTER — Other Ambulatory Visit: Payer: Self-pay | Admitting: Nurse Practitioner

## 2022-07-31 DIAGNOSIS — K4090 Unilateral inguinal hernia, without obstruction or gangrene, not specified as recurrent: Secondary | ICD-10-CM

## 2022-08-05 ENCOUNTER — Other Ambulatory Visit: Payer: Self-pay

## 2022-08-07 ENCOUNTER — Other Ambulatory Visit: Payer: Self-pay

## 2022-08-09 ENCOUNTER — Ambulatory Visit: Payer: Medicare Other | Admitting: Nurse Practitioner

## 2022-08-19 ENCOUNTER — Other Ambulatory Visit: Payer: Self-pay | Admitting: Surgery

## 2022-09-17 ENCOUNTER — Other Ambulatory Visit: Payer: Self-pay

## 2022-09-17 ENCOUNTER — Encounter (HOSPITAL_COMMUNITY): Payer: Self-pay | Admitting: Surgery

## 2022-09-17 NOTE — H&P (Signed)
  REFERRING PHYSICIAN: Geryl Rankins, NP PROVIDER: Beverlee Nims, MD MRN: [id number] DOB: [birth year]  Subjective  Chief Complaint: Hernia  History of Present Illness: Corey Guerrero is a 67 y.o. male who is seen  as an office consultation for evaluation of Hernia  This is a pleasant 67 year old gentleman presents with a left inguinal hernia. It was actually seen on a CT scan several years ago but was asymptomatic. He has now noticed a bulge in the left inguinal area especially when doing any kind of Valsalva maneuver like voiding. He reports it will easily reduced and he has had no pain or obstructive symptoms. He is otherwise healthy without complaints.  Review of Systems: A complete review of systems was obtained from the patient. I have reviewed this information and discussed as appropriate with the patient. See HPI as well for other ROS.  ROS  Medical History: Past Medical History: Diagnosis Date Chronic kidney disease GERD (gastroesophageal reflux disease) Hypertension Sleep apnea  There is no problem list on file for this patient.  Past Surgical History: Procedure Laterality Date elbow surgery 2023   Allergies Allergen Reactions Trazodone Hcl Anaphylaxis and Shortness Of Breath Patient STOPPED BREATHING!! Venom-Honey Bee Anaphylaxis  Current Outpatient Medications on File Prior to Visit Medication Sig Dispense Refill amLODIPine (NORVASC) 10 MG tablet Take 1 tablet by mouth once daily aspirin 81 MG chewable tablet Take by mouth atorvastatin (LIPITOR) 40 MG tablet Take 40 mg by mouth once daily carvediloL (COREG) 12.5 MG tablet Take by mouth cloNIDine HCL (CATAPRES) 0.1 MG tablet Take 0.1 mg by mouth 2 (two) times daily lisinopriL (ZESTRIL) 20 MG tablet Take 20 mg by mouth once daily omeprazole (PRILOSEC) 20 MG DR capsule Take 1 capsule by mouth once daily sildenafiL (VIAGRA) 100 MG tablet Take by mouth  No current facility-administered medications on file  prior to visit.  Family History Problem Relation Age of Onset High blood pressure (Hypertension) Mother Diabetes Father   Social History  Tobacco Use Smoking Status Never Smokeless Tobacco Never   Social History  Socioeconomic History Marital status: Single Tobacco Use Smoking status: Never Smokeless tobacco: Never Substance and Sexual Activity Alcohol use: Never Drug use: Never  Objective:  Vitals:  BP: (!) 142/100 Pulse: 97 Temp: 36.7 C (98 F) SpO2: 98% Weight: 85.5 kg (188 lb 9.6 oz) Height: 167.6 cm (5\' 6" ) PainSc: 2  Body mass index is 30.44 kg/m.  Physical Exam  He appears well on exam  His abdomen is soft and nontender.  There is an easily reducible left inguinal hernia. There is no evidence of right inguinal hernia or umbilical hernia  Labs, Imaging and Diagnostic Testing: I reviewed his CT scan and ultrasound  Assessment and Plan:  Diagnoses and all orders for this visit:  Left inguinal hernia   I had a discussion with the patient regarding hernias and abdominal wall anatomy. We discussed the reasons to repair hernias. He is interested in repair given the size of the hernia. I next discussed both the laparoscopic and open techniques as well as use of mesh. After long discussion he wishes to proceed with a open left inguinal hernia repair with mesh. I explained the surgical procedure in detail. We discussed the risks which includes but is not limited to bleeding, infection, injury to surrounding structures, nerve entrapment, chronic pain, use of mesh, hernia recurrence, postoperative recovery, cardiopulmonary issues, DVT, etc. He understands and wishes to proceed with surgery which will be scheduled.

## 2022-09-17 NOTE — Progress Notes (Addendum)
Mr. Mussen denies chest pain or shortness of breath. Patient denies having any s/s of Covid in his household, also denies any known exposure to Covid. Mr. Akina denies s/s of lower respiratory infection.  Mr. Huso reports that he had a stuffy nose  and was seen by Geryl Rankins, NP, (patient's PCP), on 07/17/22.  Patient said he stopped nasal spray 2 weeks ago and has no further symptoms. Mr. Diperna cardiologist is Dr. Lizbeth Bark.

## 2022-09-18 ENCOUNTER — Ambulatory Visit (HOSPITAL_BASED_OUTPATIENT_CLINIC_OR_DEPARTMENT_OTHER): Payer: 59 | Admitting: Anesthesiology

## 2022-09-18 ENCOUNTER — Other Ambulatory Visit: Payer: Self-pay

## 2022-09-18 ENCOUNTER — Ambulatory Visit (HOSPITAL_COMMUNITY)
Admission: RE | Admit: 2022-09-18 | Discharge: 2022-09-18 | Disposition: A | Discharge status: Home / Self Care | Payer: 59 | Attending: Surgery | Admitting: Surgery

## 2022-09-18 ENCOUNTER — Encounter (HOSPITAL_COMMUNITY)
Admission: RE | Disposition: A | Discharge status: Home / Self Care | Payer: Self-pay | Source: Home / Self Care | Attending: Surgery

## 2022-09-18 ENCOUNTER — Ambulatory Visit (HOSPITAL_COMMUNITY): Payer: 59 | Admitting: Anesthesiology

## 2022-09-18 ENCOUNTER — Encounter (HOSPITAL_COMMUNITY): Payer: Self-pay | Admitting: Surgery

## 2022-09-18 DIAGNOSIS — K4090 Unilateral inguinal hernia, without obstruction or gangrene, not specified as recurrent: Secondary | ICD-10-CM

## 2022-09-18 DIAGNOSIS — I10 Essential (primary) hypertension: Secondary | ICD-10-CM

## 2022-09-18 DIAGNOSIS — G709 Myoneural disorder, unspecified: Secondary | ICD-10-CM | POA: Insufficient documentation

## 2022-09-18 DIAGNOSIS — N189 Chronic kidney disease, unspecified: Secondary | ICD-10-CM | POA: Diagnosis not present

## 2022-09-18 DIAGNOSIS — G4730 Sleep apnea, unspecified: Secondary | ICD-10-CM | POA: Insufficient documentation

## 2022-09-18 DIAGNOSIS — I129 Hypertensive chronic kidney disease with stage 1 through stage 4 chronic kidney disease, or unspecified chronic kidney disease: Secondary | ICD-10-CM | POA: Diagnosis not present

## 2022-09-18 DIAGNOSIS — K219 Gastro-esophageal reflux disease without esophagitis: Secondary | ICD-10-CM | POA: Diagnosis not present

## 2022-09-18 DIAGNOSIS — Z8249 Family history of ischemic heart disease and other diseases of the circulatory system: Secondary | ICD-10-CM | POA: Insufficient documentation

## 2022-09-18 DIAGNOSIS — Z79899 Other long term (current) drug therapy: Secondary | ICD-10-CM | POA: Insufficient documentation

## 2022-09-18 HISTORY — PX: INGUINAL HERNIA REPAIR: SHX194

## 2022-09-18 HISTORY — DX: Chronic kidney disease, unspecified: N18.9

## 2022-09-18 HISTORY — DX: Headache, unspecified: R51.9

## 2022-09-18 LAB — BASIC METABOLIC PANEL
Anion gap: 10 (ref 5–15)
BUN: 23 mg/dL (ref 8–23)
CO2: 22 mmol/L (ref 22–32)
Calcium: 9.1 mg/dL (ref 8.9–10.3)
Chloride: 103 mmol/L (ref 98–111)
Creatinine, Ser: 1.48 mg/dL — ABNORMAL HIGH (ref 0.61–1.24)
GFR, Estimated: 52 mL/min — ABNORMAL LOW (ref >60)
Glucose, Bld: 100 mg/dL — ABNORMAL HIGH (ref 70–99)
Potassium: 3.9 mmol/L (ref 3.5–5.1)
Sodium: 135 mmol/L (ref 135–145)

## 2022-09-18 LAB — CBC
HCT: 47.4 % (ref 39.0–52.0)
Hemoglobin: 16.6 g/dL (ref 13.0–17.0)
MCH: 32.5 pg (ref 26.0–34.0)
MCHC: 35 g/dL (ref 30.0–36.0)
MCV: 92.9 fL (ref 80.0–100.0)
Platelets: 152 10*3/uL (ref 150–400)
RBC: 5.1 MIL/uL (ref 4.22–5.81)
RDW: 12.5 % (ref 11.5–15.5)
WBC: 5.2 10*3/uL (ref 4.0–10.5)
nRBC: 0 % (ref 0.0–0.2)

## 2022-09-18 SURGERY — REPAIR, HERNIA, INGUINAL, ADULT
Anesthesia: General | Site: Groin | Laterality: Left

## 2022-09-18 MED ORDER — MIDAZOLAM HCL 2 MG/2ML IJ SOLN
INTRAMUSCULAR | Status: AC
  Filled 2022-09-18: qty 2

## 2022-09-18 MED ORDER — BUPIVACAINE HCL (PF) 0.5 % IJ SOLN
INTRAMUSCULAR | Status: DC | PRN
  Administered 2022-09-18: 10 mL

## 2022-09-18 MED ORDER — 0.9 % SODIUM CHLORIDE (POUR BTL) OPTIME
TOPICAL | Status: DC | PRN
  Administered 2022-09-18: 1000 mL

## 2022-09-18 MED ORDER — ONDANSETRON HCL 4 MG/2ML IJ SOLN
INTRAMUSCULAR | Status: AC
  Filled 2022-09-18: qty 2

## 2022-09-18 MED ORDER — FENTANYL CITRATE (PF) 250 MCG/5ML IJ SOLN
INTRAMUSCULAR | Status: AC
  Filled 2022-09-18: qty 5

## 2022-09-18 MED ORDER — CHLORHEXIDINE GLUCONATE CLOTH 2 % EX PADS: 6.0000 | MEDICATED_PAD | Freq: Once | CUTANEOUS | Status: DC

## 2022-09-18 MED ORDER — OXYCODONE HCL 5 MG PO TABS
5.0000 mg | ORAL_TABLET | Freq: Once | ORAL | Status: AC | PRN
  Administered 2022-09-18: 5 mg via ORAL

## 2022-09-18 MED ORDER — PHENYLEPHRINE 80 MCG/ML (10ML) SYRINGE FOR IV PUSH (FOR BLOOD PRESSURE SUPPORT)
PREFILLED_SYRINGE | INTRAVENOUS | Status: DC | PRN
  Administered 2022-09-18: 80 ug via INTRAVENOUS

## 2022-09-18 MED ORDER — HYDROMORPHONE HCL 1 MG/ML IJ SOLN: 0.2500 mg | INTRAMUSCULAR | Status: DC | PRN

## 2022-09-18 MED ORDER — ORAL CARE MOUTH RINSE: 15.0000 mL | Freq: Once | OROMUCOSAL | Status: AC

## 2022-09-18 MED ORDER — DEXAMETHASONE SODIUM PHOSPHATE 10 MG/ML IJ SOLN
INTRAMUSCULAR | Status: AC
  Filled 2022-09-18: qty 1

## 2022-09-18 MED ORDER — EPHEDRINE 5 MG/ML INJ
INTRAVENOUS | Status: AC
  Filled 2022-09-18: qty 5

## 2022-09-18 MED ORDER — OXYCODONE HCL 5 MG PO TABS
ORAL_TABLET | ORAL | Status: AC
  Filled 2022-09-18: qty 1

## 2022-09-18 MED ORDER — ONDANSETRON HCL 4 MG/2ML IJ SOLN
INTRAMUSCULAR | Status: DC | PRN
  Administered 2022-09-18: 4 mg via INTRAVENOUS

## 2022-09-18 MED ORDER — GABAPENTIN 300 MG PO CAPS
300.0000 mg | ORAL_CAPSULE | Freq: Once | ORAL | Status: AC
  Administered 2022-09-18: 300 mg via ORAL
  Filled 2022-09-18: qty 1

## 2022-09-18 MED ORDER — ROPIVACAINE HCL 5 MG/ML IJ SOLN
INTRAMUSCULAR | Status: DC | PRN
  Administered 2022-09-18: 30 mL via PERINEURAL

## 2022-09-18 MED ORDER — PROPOFOL 10 MG/ML IV BOLUS
INTRAVENOUS | Status: AC
  Filled 2022-09-18: qty 20

## 2022-09-18 MED ORDER — OXYCODONE HCL 5 MG/5ML PO SOLN: 5.0000 mg | Freq: Once | ORAL | Status: AC | PRN

## 2022-09-18 MED ORDER — MIDAZOLAM HCL 2 MG/2ML IJ SOLN
1.0000 mg | Freq: Once | INTRAMUSCULAR | Status: AC
  Administered 2022-09-18: 1 mg via INTRAVENOUS

## 2022-09-18 MED ORDER — CEFAZOLIN SODIUM-DEXTROSE 2-4 GM/100ML-% IV SOLN
2.0000 g | INTRAVENOUS | Status: AC
  Administered 2022-09-18: 2 g via INTRAVENOUS
  Filled 2022-09-18: qty 100

## 2022-09-18 MED ORDER — BUPIVACAINE HCL (PF) 0.5 % IJ SOLN
INTRAMUSCULAR | Status: AC
  Filled 2022-09-18: qty 30

## 2022-09-18 MED ORDER — OXYCODONE HCL 5 MG PO TABS
5.0000 mg | ORAL_TABLET | Freq: Four times a day (QID) | ORAL | 0 refills | Status: DC | PRN
  Filled 2022-09-18: qty 20, 5d supply, fill #0

## 2022-09-18 MED ORDER — EPHEDRINE SULFATE-NACL 50-0.9 MG/10ML-% IV SOSY
PREFILLED_SYRINGE | INTRAVENOUS | Status: DC | PRN
  Administered 2022-09-18: 15 mg via INTRAVENOUS
  Administered 2022-09-18: 10 mg via INTRAVENOUS

## 2022-09-18 MED ORDER — FENTANYL CITRATE (PF) 100 MCG/2ML IJ SOLN
INTRAMUSCULAR | Status: AC
  Filled 2022-09-18: qty 2

## 2022-09-18 MED ORDER — ACETAMINOPHEN 500 MG PO TABS
1000.0000 mg | ORAL_TABLET | Freq: Once | ORAL | Status: AC
  Administered 2022-09-18: 1000 mg via ORAL
  Filled 2022-09-18: qty 2

## 2022-09-18 MED ORDER — BUPIVACAINE-EPINEPHRINE (PF) 0.5% -1:200000 IJ SOLN
INTRAMUSCULAR | Status: AC
  Filled 2022-09-18: qty 1.8

## 2022-09-18 MED ORDER — CLONIDINE HCL (ANALGESIA) 100 MCG/ML EP SOLN
EPIDURAL | Status: DC | PRN
  Administered 2022-09-18: 80 ug

## 2022-09-18 MED ORDER — FENTANYL CITRATE (PF) 250 MCG/5ML IJ SOLN
INTRAMUSCULAR | Status: DC | PRN
  Administered 2022-09-18 (×2): 50 ug via INTRAVENOUS

## 2022-09-18 MED ORDER — FENTANYL CITRATE (PF) 100 MCG/2ML IJ SOLN
50.0000 ug | Freq: Once | INTRAMUSCULAR | Status: AC
  Administered 2022-09-18: 50 ug via INTRAVENOUS

## 2022-09-18 MED ORDER — MEPERIDINE HCL 25 MG/ML IJ SOLN: 6.2500 mg | INTRAMUSCULAR | Status: DC | PRN

## 2022-09-18 MED ORDER — ENSURE PRE-SURGERY PO LIQD: 296.0000 mL | Freq: Once | ORAL | Status: DC

## 2022-09-18 MED ORDER — AMISULPRIDE (ANTIEMETIC) 5 MG/2ML IV SOLN: 10.0000 mg | Freq: Once | INTRAVENOUS | Status: DC | PRN

## 2022-09-18 MED ORDER — LACTATED RINGERS IV SOLN: INTRAVENOUS | Status: DC

## 2022-09-18 MED ORDER — DEXAMETHASONE SODIUM PHOSPHATE 4 MG/ML IJ SOLN
INTRAMUSCULAR | Status: DC | PRN
  Administered 2022-09-18: 5 mg via PERINEURAL

## 2022-09-18 MED ORDER — LIDOCAINE 2% (20 MG/ML) 5 ML SYRINGE
INTRAMUSCULAR | Status: DC | PRN
  Administered 2022-09-18: 80 mg via INTRAVENOUS

## 2022-09-18 MED ORDER — LIDOCAINE 2% (20 MG/ML) 5 ML SYRINGE
INTRAMUSCULAR | Status: AC
  Filled 2022-09-18: qty 5

## 2022-09-18 MED ORDER — DEXAMETHASONE SODIUM PHOSPHATE 10 MG/ML IJ SOLN
INTRAMUSCULAR | Status: DC | PRN
  Administered 2022-09-18: 10 mg via INTRAVENOUS

## 2022-09-18 MED ORDER — PROPOFOL 10 MG/ML IV BOLUS
INTRAVENOUS | Status: DC | PRN
  Administered 2022-09-18: 160 mg via INTRAVENOUS
  Administered 2022-09-18: 40 mg via INTRAVENOUS

## 2022-09-18 MED ORDER — CHLORHEXIDINE GLUCONATE 0.12 % MT SOLN
15.0000 mL | Freq: Once | OROMUCOSAL | Status: AC
  Administered 2022-09-18: 15 mL via OROMUCOSAL
  Filled 2022-09-18: qty 15

## 2022-09-18 MED ORDER — PROMETHAZINE HCL 25 MG/ML IJ SOLN: 6.2500 mg | INTRAMUSCULAR | Status: DC | PRN

## 2022-09-18 SURGICAL SUPPLY — 35 items
ADH SKN CLS APL DERMABOND .7 (GAUZE/BANDAGES/DRESSINGS) ×1
APL PRP STRL LF DISP 70% ISPRP (MISCELLANEOUS) ×1
BAG COUNTER SPONGE SURGICOUNT (BAG) IMPLANT
BAG SPNG CNTER NS LX DISP (BAG) ×1
BLADE CLIPPER SURG (BLADE) IMPLANT
CHLORAPREP W/TINT 26 (MISCELLANEOUS) ×1 IMPLANT
COVER SURGICAL LIGHT HANDLE (MISCELLANEOUS) ×1 IMPLANT
DERMABOND ADVANCED .7 DNX12 (GAUZE/BANDAGES/DRESSINGS) ×1 IMPLANT
DRAIN PENROSE .5X12 LATEX STL (DRAIN) IMPLANT
DRAPE LAPAROTOMY TRNSV 102X78 (DRAPES) ×1 IMPLANT
ELECT REM PT RETURN 9FT ADLT (ELECTROSURGICAL) ×1
ELECTRODE REM PT RTRN 9FT ADLT (ELECTROSURGICAL) ×1 IMPLANT
GLOVE SURG SIGNA 7.5 PF LTX (GLOVE) ×1 IMPLANT
GOWN STRL REUS W/ TWL LRG LVL3 (GOWN DISPOSABLE) ×1 IMPLANT
GOWN STRL REUS W/ TWL XL LVL3 (GOWN DISPOSABLE) ×1 IMPLANT
GOWN STRL REUS W/TWL LRG LVL3 (GOWN DISPOSABLE) ×1
GOWN STRL REUS W/TWL XL LVL3 (GOWN DISPOSABLE) ×1
KIT BASIN OR (CUSTOM PROCEDURE TRAY) ×1 IMPLANT
KIT TURNOVER KIT B (KITS) ×1 IMPLANT
MESH PARIETEX PROGRIP LEFT (Mesh General) IMPLANT
NDL HYPO 25GX1X1/2 BEV (NEEDLE) ×1 IMPLANT
NEEDLE HYPO 25GX1X1/2 BEV (NEEDLE) ×1 IMPLANT
NS IRRIG 1000ML POUR BTL (IV SOLUTION) ×1 IMPLANT
PACK GENERAL/GYN (CUSTOM PROCEDURE TRAY) ×1 IMPLANT
PAD ARMBOARD 7.5X6 YLW CONV (MISCELLANEOUS) ×1 IMPLANT
PENCIL SMOKE EVACUATOR (MISCELLANEOUS) ×1 IMPLANT
SUT MON AB 4-0 PC3 18 (SUTURE) ×1 IMPLANT
SUT SILK 2 0 SH (SUTURE) IMPLANT
SUT VIC AB 2-0 CT1 27 (SUTURE) ×2
SUT VIC AB 2-0 CT1 TAPERPNT 27 (SUTURE) ×1 IMPLANT
SUT VIC AB 3-0 CT1 27 (SUTURE) ×1
SUT VIC AB 3-0 CT1 TAPERPNT 27 (SUTURE) ×1 IMPLANT
SYR CONTROL 10ML LL (SYRINGE) ×1 IMPLANT
TOWEL GREEN STERILE (TOWEL DISPOSABLE) ×1 IMPLANT
TOWEL GREEN STERILE FF (TOWEL DISPOSABLE) ×1 IMPLANT

## 2022-09-18 NOTE — Discharge Instructions (Signed)
[facility]__[facility]  UMBILICAL OR INGUINAL HERNIA REPAIR: POST OP INSTRUCTIONS  Always review your discharge instruction sheet given to you by the facility where your surgery was performed. IF YOU HAVE DISABILITY OR FAMILY LEAVE FORMS, YOU MUST BRING THEM TO THE OFFICE FOR PROCESSING.   DO NOT GIVE THEM TO YOUR DOCTOR.  1. A  prescription for pain medication may be given to you upon discharge.  Take your pain medication as prescribed, if needed.  If narcotic pain medicine is not needed, then you may take acetaminophen (Tylenol) or ibuprofen (Advil) as needed. 2. Take your usually prescribed medications unless otherwise directed. If you need a refill on your pain medication, please contact your pharmacy.  They will contact our office to request authorization. Prescriptions will not be filled after 5 pm or on week-ends. 3. You should follow a light diet the first 24 hours after arrival home, such as soup and crackers, etc.  Be sure to include lots of fluids daily.  Resume your normal diet the day after surgery. 4.Most patients will experience some swelling and bruising around the umbilicus or in the groin and scrotum.  Ice packs and reclining will help.  Swelling and bruising can take several days to resolve.  6. It is common to experience some constipation if taking pain medication after surgery.  Increasing fluid intake and taking a stool softener (such as Colace) will usually help or prevent this problem from occurring.  A mild laxative (Milk of Magnesia or Miralax) should be taken according to package directions if there are no bowel movements after 48 hours. 7. Unless discharge instructions indicate otherwise, you may remove your bandages 24-48 hours after surgery, and you may shower at that time.  You may have steri-strips (small skin tapes) in place directly over the incision.  These strips should be left on the skin for 7-10 days.  If your surgeon used skin glue on the  incision, you may shower in 24 hours.  The glue will flake off over the next 2-3 weeks.  Any sutures or staples will be removed at the office during your follow-up visit. 8. ACTIVITIES:  You may resume regular (light) daily activities beginning the next day--such as daily self-care, walking, climbing stairs--gradually increasing activities as tolerated.  You may have sexual intercourse when it is comfortable.  Refrain from any heavy lifting or straining until approved by your doctor.  a.You may drive when you are no longer taking prescription pain medication, you can comfortably wear a seatbelt, and you can safely maneuver your car and apply brakes. b.RETURN TO WORK:   _____________________________________________  9.You should see your doctor in the office for a follow-up appointment approximately 2-3 weeks after your surgery.  Make sure that you call for this appointment within a day or two after you arrive home to insure a convenient appointment time. 10.OTHER INSTRUCTIONS: _YOU MAY SHOWER STARTING TOMORROW ICE PACK, TYLENOL, AND IBUPROFEN ALSO FOR PAIN NO LIFTING MORE THAN 15 POUNDS FOR 4 WEEKS________________________    _____________________________________  WHEN TO CALL YOUR DOCTOR: Fever over 101.0 Inability to urinate Nausea and/or vomiting Extreme swelling or bruising Continued bleeding from incision. Increased pain, redness, or drainage from the incision  The clinic staff is available to answer your questions during regular business hours.  Please don't hesitate to call and ask to speak to one of the nurses for clinical concerns.  If you have a medical emergency, go to the nearest emergency room or call 911.  A surgeon from [facility] is always on call at the hospital   [address], [address], [place], [place]  [address] ?  [address] [address], [place], [place]   [address] [phone] ? [phone] ? FAX [phone] Web site: [email or web address] 

## 2022-09-18 NOTE — Transfer of Care (Signed)
Immediate Anesthesia Transfer of Care Note  Patient: Corey Guerrero  Procedure(s) Performed: OPEN LEFT INGUINAL HERNIA REPAIR WITH MESH (Left: Groin)  Patient Location: PACU  Anesthesia Type:General  Level of Consciousness: drowsy and patient cooperative  Airway & Oxygen Therapy: Patient Spontanous Breathing  Post-op Assessment: Report given to RN and Post -op Vital signs reviewed and stable  Post vital signs: Reviewed and stable  Last Vitals:  Vitals Value Taken Time  BP    Temp    Pulse    Resp    SpO2      Last Pain:  Vitals:   09/18/22 1057  PainSc: 0-No pain         Complications: No notable events documented.

## 2022-09-18 NOTE — Op Note (Signed)
OPEN LEFT INGUINAL HERNIA REPAIR WITH MESH  Procedure Note  Corey Guerrero 09/18/2022   Pre-op Diagnosis: LEFT INGUINAL HERNIA     Post-op Diagnosis: same  Procedure(s): OPEN LEFT INGUINAL HERNIA REPAIR WITH MESH  Surgeon(s): Coralie Keens, MD Maczis, Carlena Hurl, PA-C  Anesthesia: General  Staff:  Circulator: Alonza Bogus, RN Scrub Person: Bebe Liter, RN  Estimated Blood Loss: Minimal               Findings: The patient was found of a direct left inguinal hernia which was repaired with a large piece of Prolene ProGrip mesh from [employer]  Procedure: The patient was identified in the preoperative holding area.  Anesthesiology did a preoperative tap block under ultrasound guidance.  He was then brought to the operating room and identified the correct patient.  He was placed upon the operating table general anesthesia was induced.  His left lower quadrant was then prepped and draped in usual sterile fashion.  I anesthetized the skin in this area with Marcaine and then made a longitudinal incision in the left inguinal area with a scalpel.  I then dissected down through Scarpa's fascia with the electrocautery.  The external oblique fascia was then identified and opened with internal and external rings.  The patient was found to have a large direct inguinal hernia without evidence of indirect inguinal hernia.  I controlled the testicular cord and in structure with a Penrose drain.  I then completely reduced the direct hernia in its sac back into the abdominal cavity and then repaired the floor over the top of the sac with interrupted silk sutures.  I next brought a piece of large Prolene ProGrip mesh onto the field.  It was placed against the pubic tubercle and the brought around the cord structures.  I then sutured the mesh to the pubic tubercle and the shelving edge of the inguinal ligament with a 2-0 Vicryl suture.  I then sutured it to the transversalis fascia with a single 2-0  Vicryl suture as well.  Wide coverage of the inguinal floor and internal ring appeared to be achieved.  The external bleak fascia was then closed over top of this with running 2-0 Vicryl suture.  Scarpa's fascia was closed with interrupted 3-0 Vicryl sutures and the skin was closed with a running 4-0 Monocryl.  Dermabond was then applied.  The patient tolerated the procedure well.  All the counts were correct at the end of the procedure.  The patient was then extubated in the operating room and taken in a stable condition to the recovery room.          Coralie Keens   Date: 09/18/2022  Time: 1:46 PM

## 2022-09-18 NOTE — Interval H&P Note (Signed)
History and Physical Interval Note: no change in H and P  09/18/2022 11:47 AM  Corey Guerrero  has presented today for surgery, with the diagnosis of LEFT INGUINAL HERNIA.  The various methods of treatment have been discussed with the patient and family. After consideration of risks, benefits and other options for treatment, the patient has consented to  Procedure(s): OPEN LEFT INGUINAL HERNIA REPAIR WITH MESH (Left) as a surgical intervention.  The patient's history has been reviewed, patient examined, no change in status, stable for surgery.  I have reviewed the patient's chart and labs.  Questions were answered to the patient's satisfaction.     Coralie Keens

## 2022-09-18 NOTE — Anesthesia Procedure Notes (Signed)
Procedure Name: LMA Insertion Date/Time: 09/18/2022 1:18 PM  Performed by: Lance Coon, CRNAPre-anesthesia Checklist: Patient identified, Emergency Drugs available, Suction available, Patient being monitored and Timeout performed Patient Re-evaluated:Patient Re-evaluated prior to induction Oxygen Delivery Method: Circle system utilized Preoxygenation: Pre-oxygenation with 100% oxygen Induction Type: IV induction LMA: LMA inserted LMA Size: 4.0 Number of attempts: 1 Placement Confirmation: positive ETCO2 and breath sounds checked- equal and bilateral Tube secured with: Tape Dental Injury: Teeth and Oropharynx as per pre-operative assessment

## 2022-09-18 NOTE — Anesthesia Procedure Notes (Signed)
Anesthesia Regional Block: TAP block   Pre-Anesthetic Checklist: , timeout performed,  Correct Patient, Correct Site, Correct Laterality,  Correct Procedure, Correct Position, site marked,  Risks and benefits discussed,  Surgical consent,  Pre-op evaluation,  At surgeon's request and post-op pain management  Laterality: Left  Prep: chloraprep       Needles:  Injection technique: Single-shot  Needle Type: Echogenic Stimulator Needle      Needle Gauge: 21     Additional Needles:   Procedures:,,,, ultrasound used (permanent image in chart),,    Narrative:  Start time: 09/18/2022 12:17 PM End time: 09/18/2022 12:37 PM Injection made incrementally with aspirations every 5 mL.  Performed by: Personally  Anesthesiologist: Nolon Nations, MD  Additional Notes: BP cuff, SpO2 and EKG monitors applied. Sedation begun.  Anesthetic injected incrementally, slowly, and after neg aspirations under direct ultrasound guidance. Good fascial spread noted. Patient tolerated well.

## 2022-09-18 NOTE — Anesthesia Preprocedure Evaluation (Addendum)
Anesthesia Evaluation  Patient identified by MRN, date of birth, ID band Patient awake    Reviewed: Allergy & Precautions, NPO status , Patient's Chart, lab work & pertinent test results  Airway Mallampati: II  TM Distance: >3 FB Neck ROM: Full    Dental  (+) Dental Advisory Given, Edentulous Upper, Edentulous Lower   Pulmonary sleep apnea    Pulmonary exam normal breath sounds clear to auscultation       Cardiovascular hypertension, Pt. on medications and Pt. on home beta blockers Normal cardiovascular exam Rhythm:Regular Rate:Normal  Echo 2019 - Left ventricle: The cavity size was normal. There was moderate concentric hypertrophy. Systolic function was normal. The estimated ejection fraction was in the range of 60% to 65%. Wall   motion was normal; there were no regional wall motion abnormalities. Features are consistent with a pseudonormal left ventricular filling pattern, with concomitant abnormal relaxation and increased filling pressure (grade 2 diastolic dysfunction). Doppler parameters are consistent with high ventricular filling    pressure.  - Left atrium: The atrium was mildly dilated.  - Atrial septum: There was increased thickness of the septum, consistent with lipomatous hypertrophy.     Neuro/Psych  Headaches  Neuromuscular disease    GI/Hepatic ,GERD  ,,(+)     substance abuse  alcohol use, cocaine use and marijuana use  Endo/Other    Renal/GU Renal disease     Musculoskeletal   Abdominal   Peds  Hematology   Anesthesia Other Findings   Reproductive/Obstetrics                             Anesthesia Physical Anesthesia Plan  ASA: 3  Anesthesia Plan: General   Post-op Pain Management: Regional block*, Tylenol PO (pre-op)* and Gabapentin PO (pre-op)*   Induction:   PONV Risk Score and Plan: 2 and Ondansetron, Dexamethasone, Midazolam and Treatment may vary due to age or  medical condition  Airway Management Planned: Oral ETT  Additional Equipment:   Intra-op Plan:   Post-operative Plan: Extubation in OR  Informed Consent: I have reviewed the patients History and Physical, chart, labs and discussed the procedure including the risks, benefits and alternatives for the proposed anesthesia with the patient or authorized representative who has indicated his/her understanding and acceptance.     Dental advisory given  Plan Discussed with: CRNA  Anesthesia Plan Comments:         Anesthesia Quick Evaluation

## 2022-09-19 ENCOUNTER — Encounter (HOSPITAL_COMMUNITY): Payer: Self-pay | Admitting: Surgery

## 2022-09-19 ENCOUNTER — Other Ambulatory Visit: Payer: Self-pay

## 2022-09-20 NOTE — Anesthesia Postprocedure Evaluation (Signed)
Anesthesia Post Note  Patient: Corey Guerrero  Procedure(s) Performed: OPEN LEFT INGUINAL HERNIA REPAIR WITH MESH (Left: Groin)     Patient location during evaluation: PACU Anesthesia Type: General Level of consciousness: sedated and patient cooperative Pain management: pain level controlled Vital Signs Assessment: post-procedure vital signs reviewed and stable Respiratory status: spontaneous breathing Cardiovascular status: stable Anesthetic complications: no   No notable events documented.  Last Vitals:  Vitals:   09/18/22 1445 09/18/22 1500  BP: (!) 139/96 (!) 143/106  Pulse: (!) 48 (!) 51  Resp: 16 20  Temp:  36.5 C  SpO2: 91% 98%    Last Pain:  Vitals:   09/18/22 1057  PainSc: 0-No pain                 Nolon Nations

## 2022-09-28 ENCOUNTER — Other Ambulatory Visit: Payer: Self-pay

## 2022-09-30 ENCOUNTER — Other Ambulatory Visit: Payer: Self-pay

## 2022-10-01 ENCOUNTER — Other Ambulatory Visit: Payer: Self-pay

## 2022-10-01 ENCOUNTER — Other Ambulatory Visit: Payer: Self-pay | Admitting: Nurse Practitioner

## 2022-10-01 DIAGNOSIS — R0981 Nasal congestion: Secondary | ICD-10-CM

## 2022-10-01 MED ORDER — FLUTICASONE PROPIONATE 50 MCG/ACT NA SUSP
2.0000 | Freq: Every day | NASAL | 0 refills | Status: DC
  Filled 2022-10-01: qty 16, 30d supply, fill #0

## 2022-10-01 NOTE — Telephone Encounter (Signed)
Requested Prescriptions  Pending Prescriptions Disp Refills   fluticasone (FLONASE) 50 MCG/ACT nasal spray 16 g 0    Sig: Place 2 sprays into both nostrils daily.     Ear, Nose, and Throat: Nasal Preparations - Corticosteroids Passed - 10/01/2022  6:57 AM      Passed - Valid encounter within last 12 months    Recent Outpatient Visits           2 months ago Frequency of urination   [place], NP   4 months ago Essential hypertension   [place], MD   7 months ago Hospital discharge follow-up   [facility] [name], NP   9 months ago Essential hypertension   [facility] [place], Vernia Buff, NP   1 year ago Encounter for annual physical exam   [facility] [name], NP       Future Appointments             In 2 weeks [name], NP [place]

## 2022-10-02 ENCOUNTER — Other Ambulatory Visit: Payer: Self-pay

## 2022-10-03 ENCOUNTER — Other Ambulatory Visit: Payer: Self-pay

## 2022-10-03 MED ORDER — OXYCODONE HCL 5 MG PO TABS
5.0000 mg | ORAL_TABLET | Freq: Four times a day (QID) | ORAL | 0 refills | Status: DC | PRN
  Filled 2022-10-03: qty 20, 5d supply, fill #0

## 2022-10-18 ENCOUNTER — Other Ambulatory Visit: Payer: Self-pay

## 2022-10-18 ENCOUNTER — Ambulatory Visit: Payer: 59 | Attending: Nurse Practitioner | Admitting: Nurse Practitioner

## 2022-10-18 ENCOUNTER — Encounter: Payer: Self-pay | Admitting: Nurse Practitioner

## 2022-10-18 VITALS — BP 148/93 | HR 50 | Ht 66.0 in | Wt 197.6 lb

## 2022-10-18 DIAGNOSIS — R7303 Prediabetes: Secondary | ICD-10-CM

## 2022-10-18 DIAGNOSIS — K5901 Slow transit constipation: Secondary | ICD-10-CM | POA: Diagnosis not present

## 2022-10-18 DIAGNOSIS — I10 Essential (primary) hypertension: Secondary | ICD-10-CM

## 2022-10-18 MED ORDER — SENNOSIDES-DOCUSATE SODIUM 8.6-50 MG PO TABS
2.0000 | ORAL_TABLET | Freq: Every day | ORAL | 1 refills | Status: DC
  Filled 2022-10-18 – 2022-10-31 (×2): qty 180, 90d supply, fill #0
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
## 2022-10-18 NOTE — Progress Notes (Signed)
No concerns. 

## 2022-10-19 LAB — HEMOGLOBIN A1C
Est. average glucose Bld gHb Est-mCnc: 128 mg/dL
Hgb A1c MFr Bld: 6.1 % — ABNORMAL HIGH (ref 4.8–5.6)

## 2022-10-19 LAB — CMP14+EGFR
ALT: 33 IU/L (ref 0–44)
AST: 36 IU/L (ref 0–40)
Albumin/Globulin Ratio: 1.3 (ref 1.2–2.2)
Albumin: 4.2 g/dL (ref 3.9–4.9)
Alkaline Phosphatase: 97 IU/L (ref 44–121)
BUN/Creatinine Ratio: 13 (ref 10–24)
BUN: 22 mg/dL (ref 8–27)
Bilirubin Total: 0.5 mg/dL (ref 0.0–1.2)
CO2: 23 mmol/L (ref 20–29)
Calcium: 9.4 mg/dL (ref 8.6–10.2)
Chloride: 103 mmol/L (ref 96–106)
Creatinine, Ser: 1.76 mg/dL — ABNORMAL HIGH (ref 0.76–1.27)
Globulin, Total: 3.3 g/dL (ref 1.5–4.5)
Glucose: 108 mg/dL — ABNORMAL HIGH (ref 70–99)
Potassium: 4 mmol/L (ref 3.5–5.2)
Sodium: 140 mmol/L (ref 134–144)
Total Protein: 7.5 g/dL (ref 6.0–8.5)
eGFR: 42 mL/min/{1.73_m2} — ABNORMAL LOW (ref >59)

## 2022-10-31 ENCOUNTER — Other Ambulatory Visit: Payer: Self-pay | Admitting: Nurse Practitioner

## 2022-10-31 ENCOUNTER — Other Ambulatory Visit: Payer: Self-pay

## 2022-10-31 DIAGNOSIS — R0981 Nasal congestion: Secondary | ICD-10-CM

## 2022-10-31 LAB — LAB REPORT - SCANNED
Albumin, Urine POC: 1042.2
Albumin/Creatinine Ratio, Urine, POC: 437
Creatinine, POC: 238.6 mg/dL
EGFR: 43

## 2022-10-31 MED ORDER — FLUTICASONE PROPIONATE 50 MCG/ACT NA SUSP
2.0000 | Freq: Every day | NASAL | 0 refills | Status: DC
  Filled 2022-10-31: qty 16, 30d supply, fill #0

## 2022-10-31 NOTE — Telephone Encounter (Signed)
Requested Prescriptions  Pending Prescriptions Disp Refills   fluticasone (FLONASE) 50 MCG/ACT nasal spray 16 g 0    Sig: Place 2 sprays into both nostrils daily.     Ear, Nose, and Throat: Nasal Preparations - Corticosteroids Passed - 10/31/2022 10:25 AM      Passed - Valid encounter within last 12 months    Recent Outpatient Visits           1 week ago Primary hypertension   [facility] [place], Shea Stakes, NP   3 months ago Frequency of urination   [facility] [place], Shea Stakes, NP   5 months ago Essential hypertension   [facility] Marcine Matar, MD   8 months ago Hospital discharge follow-up   [facility] Claiborne Rigg, NP   10 months ago Essential hypertension   [facility] [place] Claiborne Rigg, NP       Future Appointments             In 5 days Claiborne Rigg, NP [facility]   In 2 months Claiborne Rigg, NP [facility]

## 2022-11-04 ENCOUNTER — Encounter (HOSPITAL_BASED_OUTPATIENT_CLINIC_OR_DEPARTMENT_OTHER): Payer: Self-pay | Admitting: Emergency Medicine

## 2022-11-04 ENCOUNTER — Emergency Department (HOSPITAL_BASED_OUTPATIENT_CLINIC_OR_DEPARTMENT_OTHER)
Admission: EM | Admit: 2022-11-04 | Discharge: 2022-11-04 | Disposition: A | Discharge status: Home / Self Care | Payer: 59 | Attending: Emergency Medicine | Admitting: Emergency Medicine

## 2022-11-04 ENCOUNTER — Emergency Department (HOSPITAL_BASED_OUTPATIENT_CLINIC_OR_DEPARTMENT_OTHER): Payer: 59 | Admitting: Radiology

## 2022-11-04 ENCOUNTER — Other Ambulatory Visit: Payer: Self-pay

## 2022-11-04 DIAGNOSIS — Z7982 Long term (current) use of aspirin: Secondary | ICD-10-CM | POA: Diagnosis not present

## 2022-11-04 DIAGNOSIS — I129 Hypertensive chronic kidney disease with stage 1 through stage 4 chronic kidney disease, or unspecified chronic kidney disease: Secondary | ICD-10-CM | POA: Insufficient documentation

## 2022-11-04 DIAGNOSIS — M79674 Pain in right toe(s): Secondary | ICD-10-CM | POA: Diagnosis present

## 2022-11-04 DIAGNOSIS — N189 Chronic kidney disease, unspecified: Secondary | ICD-10-CM | POA: Insufficient documentation

## 2022-11-04 DIAGNOSIS — M109 Gout, unspecified: Secondary | ICD-10-CM | POA: Diagnosis not present

## 2022-11-04 MED ORDER — DOXYCYCLINE HYCLATE 100 MG PO TABS
100.0000 mg | ORAL_TABLET | Freq: Two times a day (BID) | ORAL | 0 refills | Status: DC
  Filled 2022-11-04: qty 14, 7d supply, fill #0

## 2022-11-04 MED ORDER — ACETAMINOPHEN 500 MG PO TABS
1000.0000 mg | ORAL_TABLET | Freq: Once | ORAL | Status: AC
  Administered 2022-11-04: 1000 mg via ORAL
  Filled 2022-11-04: qty 2

## 2022-11-04 MED ORDER — PREDNISONE 50 MG PO TABS
60.0000 mg | ORAL_TABLET | Freq: Once | ORAL | Status: AC
  Administered 2022-11-04: 60 mg via ORAL
  Filled 2022-11-04: qty 1

## 2022-11-04 MED ORDER — OXYCODONE-ACETAMINOPHEN 5-325 MG PO TABS
1.0000 | ORAL_TABLET | Freq: Four times a day (QID) | ORAL | 0 refills | Status: DC | PRN
  Filled 2022-11-04: qty 6, 2d supply, fill #0

## 2022-11-04 MED ORDER — PREDNISONE 20 MG PO TABS
40.0000 mg | ORAL_TABLET | Freq: Every day | ORAL | 0 refills | Status: DC
  Filled 2022-11-04: qty 10, 5d supply, fill #0

## 2022-11-04 NOTE — ED Triage Notes (Signed)
Pt arrives pov, to triage in wheelchair with c/o RT ankle pain and swelling today and bunion pain x 5 days. Pt denies injury. Swelling noted

## 2022-11-04 NOTE — Discharge Instructions (Addendum)
Elevate the foot and rest.  You were given a steroid here and don't need to start the prednisone till tomorrow.  If you start having fever, drainage or seems to be getting worse return to the ER.

## 2022-11-04 NOTE — ED Notes (Signed)
ED Provider at bedside. 

## 2022-11-04 NOTE — ED Notes (Signed)
Pt discharged to home using teachback Method. Discharge instructions have been discussed with patient and/or family members. Pt verbally acknowledges understanding d/c instructions, has been given opportunity for questions to be answered, and endorses comprehension to checkout at registration before leaving.  

## 2022-11-04 NOTE — ED Provider Notes (Signed)
[place] EMERGENCY DEPARTMENT AT [facility] Provider Note   CSN: [id number] Arrival date & time: 11/04/22  3244     History  Chief Complaint  Patient presents with   Joint Swelling    Corey Guerrero is a 67 y.o. male.  Patient is a well-appearing 67 year old male with a history of hypertension, chronic kidney disease who is presenting today with complaint of right great toe pain and swelling.  It has been present for about 5 days and has worsened and is now causing some swelling in his foot and ankle.  It is painful to walk.  He does have a bunion on that foot and he thought that that was the problem.  He has bought pads and bandages to go over it but reports it is not helping with the pain.  He has not had fever, other joint pain or trauma.  He has tried Tylenol without improvement but reports with elevation the swelling does improve.  He denies any calf pain, leg pain or swelling.  The history is provided by the patient.       Home Medications Prior to Admission medications   Medication Sig Start Date End Date Taking? Authorizing Provider  doxycycline (VIBRAMYCIN) 100 MG capsule Take 1 capsule (100 mg total) by mouth 2 (two) times daily. 11/04/22  Yes Gwyneth Sprout, MD  oxyCODONE-acetaminophen (PERCOCET/ROXICET) 5-325 MG tablet Take 1 tablet by mouth every 6 (six) hours as needed for severe pain. 11/04/22  Yes Hamsini Verrilli, Alphonzo Lemmings, MD  predniSONE (DELTASONE) 20 MG tablet Take 2 tablets (40 mg total) by mouth daily. 11/04/22  Yes Sirr Kabel, Alphonzo Lemmings, MD  amLODipine (NORVASC) 10 MG tablet Take 1 tablet (10 mg total) by mouth daily. 07/17/22   Claiborne Rigg, NP  aspirin 81 MG chewable tablet Chew 1 tablet (81 mg total) by mouth daily. 07/17/22   Claiborne Rigg, NP  atorvastatin (LIPITOR) 40 MG tablet Take 1 tablet (40 mg total) by mouth daily. 07/17/22   Claiborne Rigg, NP  carvedilol (COREG) 12.5 MG tablet Take 1 tablet (12.5 mg total) by mouth 2 (two) times daily with a  meal. 07/17/22   Claiborne Rigg, NP  cloNIDine (CATAPRES) 0.1 MG tablet Take 1 tablet (0.1 mg total) by mouth 2 (two) times daily. 07/17/22   Claiborne Rigg, NP  fluticasone (FLONASE) 50 MCG/ACT nasal spray Place 2 sprays into both nostrils daily. 10/31/22   Claiborne Rigg, NP  lisinopril (ZESTRIL) 20 MG tablet Take 1 tablet (20 mg total) by mouth daily. 07/17/22   Claiborne Rigg, NP  multivitamin-iron-minerals-folic acid (CENTRUM) chewable tablet Chew 1 tablet by mouth daily.    [provider]  omeprazole (PRILOSEC) 20 MG capsule Take 1 capsule (20 mg total) by mouth daily. 07/17/22   Claiborne Rigg, NP  oxyCODONE (OXY IR/ROXICODONE) 5 MG immediate release tablet Take 1 tablet (5 mg total) by mouth every 6 (six) hours as needed for pain. 10/03/22     senna-docusate (SENOKOT-S) 8.6-50 MG tablet Take 2 tablets by mouth daily. If stools become too lose you can take 1 tablet per day. 10/18/22   Claiborne Rigg, NP  sildenafil (VIAGRA) 100 MG tablet Take 0.5-1 tablets (50-100 mg total) by mouth daily as needed for erectile dysfunction. 12/28/21   Claiborne Rigg, NP      Allergies    Bee venom and Desyrel [trazodone hcl]    Review of Systems   Review of Systems  Physical Exam Updated Vital  Signs BP 132/89   Pulse (!) 54   Temp 98.4 F (36.9 C) (Oral)   Resp 16   Wt 87.1 kg   SpO2 95%   BMI 30.99 kg/m  Physical Exam Vitals and nursing note reviewed.  Constitutional:      Appearance: Normal appearance.  HENT:     Head: Normocephalic.  Cardiovascular:     Rate and Rhythm: Normal rate.  Pulmonary:     Effort: Pulmonary effort is normal.  Musculoskeletal:        General: Tenderness present.       Feet:     Comments: Mild nonpitting edema noted in the right ankle but no pain in the right ankle.  Full range of motion.  No calf pain or swelling on the right  Skin:    General: Skin is warm and dry.  Neurological:     Mental Status: He is alert. Mental status is at  baseline.  Psychiatric:        Mood and Affect: Mood normal.     ED Results / Procedures / Treatments   Labs (all labs ordered are listed, but only abnormal results are displayed) Labs Reviewed - No data to display  EKG None  Radiology DG Foot Complete Right  Result Date: 11/04/2022 CLINICAL DATA:  Pain and redness of the left foot for 2-3 days EXAM: RIGHT FOOT COMPLETE - 3+ VIEW COMPARISON:  06/20/2011 FINDINGS: No acute fracture or dislocation. No aggressive osseous lesion. Hallux valgus. Mild osteoarthritis of the first MTP joint. No periosteal reaction or bone destruction. Small plantar calcaneal spur. Soft tissue wound overlying the medial aspect of the first metatarsal head. No radiopaque foreign body or soft tissue emphysema. IMPRESSION: 1. No acute osseous injury of the right foot. Electronically Signed   By: Elige Ko M.D.   On: 11/04/2022 08:09    Procedures Procedures    Medications Ordered in ED Medications  predniSONE (DELTASONE) tablet 60 mg (60 mg Oral Given 11/04/22 0816)  acetaminophen (TYLENOL) tablet 1,000 mg (1,000 mg Oral Given 11/04/22 1610)    ED Course/ Medical Decision Making/ A&P                             Medical Decision Making Amount and/or Complexity of Data Reviewed Radiology: ordered and independent interpretation performed. Decision-making details documented in ED Course.  Risk OTC drugs.   Patient presenting today with pain swelling and erythema in his right great toe.  Findings most consistent with gout.  Patient does have a bunion on that foot however low suspicion for septic arthritis and no findings consistent with abscess but pt states after the pain started he started wearing a corn pad and now has a small callus/blister so possible cellulitis.  Patient does have a history of chronic kidney disease and hypertension he is on multiple medications that could lead to gout as well as having chronic kidney disease.  I have independently  visualized and interpreted pt's images today. Right foot film with signs of edema in the soft tissue and mild arthritis.  Will treat with steroids, abx and pain meds.   Unfortunately due to pt's CKD cannot use NSAIDs.        Final Clinical Impression(s) / ED Diagnoses Final diagnoses:  Acute gout involving toe of right foot, unspecified cause    Rx / DC Orders ED Discharge Orders          Ordered  oxyCODONE-acetaminophen (PERCOCET/ROXICET) 5-325 MG tablet  Every 6 hours PRN        11/04/22 0827    doxycycline (VIBRAMYCIN) 100 MG capsule  2 times daily        11/04/22 0827    predniSONE (DELTASONE) 20 MG tablet  Daily        11/04/22 0827              Gwyneth Sprout, MD 11/04/22 [phone]

## 2022-11-05 ENCOUNTER — Telehealth: Payer: 59 | Admitting: Nurse Practitioner

## 2022-11-08 ENCOUNTER — Encounter: Payer: Self-pay | Admitting: Nurse Practitioner

## 2022-11-08 ENCOUNTER — Telehealth (HOSPITAL_BASED_OUTPATIENT_CLINIC_OR_DEPARTMENT_OTHER): Payer: 59 | Admitting: Nurse Practitioner

## 2022-11-08 DIAGNOSIS — I10 Essential (primary) hypertension: Secondary | ICD-10-CM
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
## 2022-12-04 ENCOUNTER — Emergency Department (HOSPITAL_BASED_OUTPATIENT_CLINIC_OR_DEPARTMENT_OTHER)
Admission: EM | Admit: 2022-12-04 | Discharge: 2022-12-04 | Disposition: A | Discharge status: Home / Self Care | Payer: 59 | Attending: Emergency Medicine | Admitting: Emergency Medicine

## 2022-12-04 ENCOUNTER — Emergency Department (HOSPITAL_BASED_OUTPATIENT_CLINIC_OR_DEPARTMENT_OTHER): Payer: 59 | Admitting: Radiology

## 2022-12-04 ENCOUNTER — Encounter (HOSPITAL_BASED_OUTPATIENT_CLINIC_OR_DEPARTMENT_OTHER): Payer: Self-pay | Admitting: *Deleted

## 2022-12-04 ENCOUNTER — Other Ambulatory Visit: Payer: Self-pay

## 2022-12-04 DIAGNOSIS — I10 Essential (primary) hypertension: Secondary | ICD-10-CM

## 2022-12-04 DIAGNOSIS — R0789 Other chest pain: Secondary | ICD-10-CM | POA: Diagnosis not present

## 2022-12-04 DIAGNOSIS — R079 Chest pain, unspecified: Secondary | ICD-10-CM | POA: Diagnosis present

## 2022-12-04 LAB — CBC
HCT: 47 % (ref 39.0–52.0)
Hemoglobin: 16.1 g/dL (ref 13.0–17.0)
MCH: 31 pg (ref 26.0–34.0)
MCHC: 34.3 g/dL (ref 30.0–36.0)
MCV: 90.6 fL (ref 80.0–100.0)
Platelets: 174 10*3/uL (ref 150–400)
RBC: 5.19 MIL/uL (ref 4.22–5.81)
RDW: 12.4 % (ref 11.5–15.5)
WBC: 5.1 10*3/uL (ref 4.0–10.5)
nRBC: 0 % (ref 0.0–0.2)

## 2022-12-04 LAB — BASIC METABOLIC PANEL
Anion gap: 7 (ref 5–15)
BUN: 23 mg/dL (ref 8–23)
CO2: 27 mmol/L (ref 22–32)
Calcium: 9.2 mg/dL (ref 8.9–10.3)
Chloride: 104 mmol/L (ref 98–111)
Creatinine, Ser: 1.6 mg/dL — ABNORMAL HIGH (ref 0.61–1.24)
GFR, Estimated: 48 mL/min — ABNORMAL LOW (ref >60)
Glucose, Bld: 95 mg/dL (ref 70–99)
Potassium: 3.8 mmol/L (ref 3.5–5.1)
Sodium: 138 mmol/L (ref 135–145)

## 2022-12-04 LAB — TROPONIN I (HIGH SENSITIVITY): Troponin I (High Sensitivity): 15 ng/L (ref <18)

## 2022-12-04 NOTE — ED Provider Notes (Signed)
[place] EMERGENCY DEPARTMENT AT [facility] Provider Note   CSN: [id number] Arrival date & time: 12/04/22  1321     History  Chief Complaint  Patient presents with   Abnormal ECG    Corey Guerrero is a 67 y.o. male.  HPI   Patient has a history of hypertension acid reflux, chronic kidney disease.  Patient states he was going into work today.  He suddenly had an episode of feeling pressure in his head and chest.  This lasted for about a minute at around 930 this morning.  Patient states he had not had anything to eat in the morning.  He wonders if that could been related.  He is not having any trouble with his balance or speech or coordination.  He has not been feeling short of breath.  His symptoms all resolved and he did not have any recurrent episodes.  Talked to his doctor who thought he should get it checked out.  Patient went to an urgent care and they sent him to the ED for further evaluation.  Home Medications Prior to Admission medications   Medication Sig Start Date End Date Taking? Authorizing Provider  amLODipine (NORVASC) 10 MG tablet Take 1 tablet (10 mg total) by mouth daily. 07/17/22   Claiborne Rigg, NP  aspirin 81 MG chewable tablet Chew 1 tablet (81 mg total) by mouth daily. 07/17/22   Claiborne Rigg, NP  atorvastatin (LIPITOR) 40 MG tablet Take 1 tablet (40 mg total) by mouth daily. 07/17/22   Claiborne Rigg, NP  carvedilol (COREG) 12.5 MG tablet Take 1 tablet (12.5 mg total) by mouth 2 (two) times daily with a meal. 07/17/22   Claiborne Rigg, NP  cloNIDine (CATAPRES) 0.1 MG tablet Take 1 tablet (0.1 mg total) by mouth 2 (two) times daily. 07/17/22   Claiborne Rigg, NP  doxycycline (VIBRA-TABS) 100 MG tablet Take 1 tablet (100 mg total) by mouth 2 (two) times daily. 11/04/22   Gwyneth Sprout, MD  fluticasone (FLONASE) 50 MCG/ACT nasal spray Place 2 sprays into both nostrils daily. 10/31/22   Claiborne Rigg, NP  lisinopril (ZESTRIL) 20 MG tablet Take  1 tablet (20 mg total) by mouth daily. 07/17/22   Claiborne Rigg, NP  multivitamin-iron-minerals-folic acid (CENTRUM) chewable tablet Chew 1 tablet by mouth daily.    [provider]  omeprazole (PRILOSEC) 20 MG capsule Take 1 capsule (20 mg total) by mouth daily. 07/17/22   Claiborne Rigg, NP  oxyCODONE (OXY IR/ROXICODONE) 5 MG immediate release tablet Take 1 tablet (5 mg total) by mouth every 6 (six) hours as needed for pain. 10/03/22     oxyCODONE-acetaminophen (PERCOCET/ROXICET) 5-325 MG tablet Take 1 tablet by mouth every 6 (six) hours as needed for severe pain. 11/04/22   Gwyneth Sprout, MD  predniSONE (DELTASONE) 20 MG tablet Take 2 tablets (40 mg total) by mouth daily. 11/04/22   Gwyneth Sprout, MD  senna-docusate (SENOKOT-S) 8.6-50 MG tablet Take 2 tablets by mouth daily. If stools become too lose you can take 1 tablet per day. 10/18/22   Claiborne Rigg, NP  sildenafil (VIAGRA) 100 MG tablet Take 0.5-1 tablets (50-100 mg total) by mouth daily as needed for erectile dysfunction. 12/28/21   Claiborne Rigg, NP      Allergies    Bee venom and Desyrel [trazodone hcl]    Review of Systems   Review of Systems  Physical Exam Updated Vital Signs BP (!) 143/89 (BP Location:  Right Arm)   Pulse (!) 46   Temp 97.6 F (36.4 C) (Oral)   Resp 20   SpO2 94%  Physical Exam Vitals and nursing note reviewed.  Constitutional:      General: He is not in acute distress.    Appearance: He is well-developed.  HENT:     Head: Normocephalic and atraumatic.     Right Ear: External ear normal.     Left Ear: External ear normal.  Eyes:     General: No scleral icterus.       Right eye: No discharge.        Left eye: No discharge.     Conjunctiva/sclera: Conjunctivae normal.  Neck:     Trachea: No tracheal deviation.  Cardiovascular:     Rate and Rhythm: Normal rate and regular rhythm.  Pulmonary:     Effort: Pulmonary effort is normal. No respiratory distress.     Breath sounds:  Normal breath sounds. No stridor. No wheezing or rales.  Abdominal:     General: Bowel sounds are normal. There is no distension.     Palpations: Abdomen is soft.     Tenderness: There is no abdominal tenderness. There is no guarding or rebound.  Musculoskeletal:        General: No tenderness or deformity.     Cervical back: Neck supple.  Skin:    General: Skin is warm and dry.     Findings: No rash.  Neurological:     General: No focal deficit present.     Mental Status: He is alert.     Cranial Nerves: No cranial nerve deficit, dysarthria or facial asymmetry.     Sensory: Sensation is intact. No sensory deficit.     Motor: No weakness, abnormal muscle tone or seizure activity.     Coordination: Coordination normal.  Psychiatric:        Mood and Affect: Mood normal.     ED Results / Procedures / Treatments   Labs (all labs ordered are listed, but only abnormal results are displayed) Labs Reviewed  BASIC METABOLIC PANEL - Abnormal; Notable for the following components:      Result Value   Creatinine, Ser 1.60 (*)    GFR, Estimated 48 (*)    All other components within normal limits  CBC  TROPONIN I (HIGH SENSITIVITY)  TROPONIN I (HIGH SENSITIVITY)    EKG EKG Interpretation  Date/Time:  Wednesday December 04 2022 13:37:52 EDT Ventricular Rate:  51 PR Interval:  158 QRS Duration: 78 QT Interval:  456 QTC Calculation: 420 R Axis:   56 Text Interpretation: Sinus bradycardia with Premature atrial complexes Anteroseptal infarct , age undetermined Abnormal ECG When compared with ECG of 18-Sep-2022 10:51, Premature atrial complexes are now Present No significant change since last tracing Confirmed by Linwood Dibbles [phone]) on 12/04/2022 1:47:56 PM  Radiology DG Chest 2 View  Result Date: 12/04/2022 CLINICAL DATA:  Brief episode of chest pressure this morning. EXAM: CHEST - 2 VIEW COMPARISON:  07/13/2021 FINDINGS: Normal sized heart. Tortuous aorta. Clear lungs with normal  vascularity. Stable bilateral diaphragmatic eventration. Minimal thoracic spine degenerative changes. IMPRESSION: No acute abnormality. Electronically Signed   By: Beckie Salts M.D.   On: 12/04/2022 14:11    Procedures Procedures    Medications Ordered in ED Medications - No data to display  ED Course/ Medical Decision Making/ A&P Clinical Course as of 12/04/22 1547  Wed Dec 04, 2022  1450 Basic metabolic panel(!) CBC normal.  Metabolic panel normal.  Troponin normal [JK]    Clinical Course User Index [JK] Linwood Dibbles, MD                             Medical Decision Making Problems Addressed: Chest discomfort: acute illness or injury that poses a threat to life or bodily functions Hypertension, unspecified type: chronic illness or injury with exacerbation, progression, or side effects of treatment  Amount and/or Complexity of Data Reviewed Labs: ordered. Decision-making details documented in ED Course. Radiology: ordered and independent interpretation performed.   Patient presented to the ED for an episode of chest and head discomfort about 9:30 in the morning.  Symptoms were brief and resolved spontaneously.  Patient was instructed to come in and get evaluated further.  Patient has not had any chest pain or discomfort since 930.  His ED workup was reassuring.  Patient was noted to be hypertensive initially but his blood pressure improved without intervention.  No signs of anemia.  No signs of severe dehydration.  Patient's troponin is normal.  X-ray does not show pneumonia.  Doubt acute coronary syndrome, pulmonary embolism or other emergency medical condition at this time.  Patient is asymptomatic and feels well.  He would like to go home.  Recommend continuing his home medications.  Return to the ED for recurrent symptoms        Final Clinical Impression(s) / ED Diagnoses Final diagnoses:  Hypertension, unspecified type  Chest discomfort    Rx / DC Orders ED Discharge  Orders     None         Linwood Dibbles, MD 12/04/22 1549

## 2022-12-04 NOTE — Discharge Instructions (Signed)
Continue your current medications.  Follow up with your doctor to be rechecked.  Return to the ED for recurrent symptoms 

## 2022-12-04 NOTE — ED Notes (Signed)
Discharge paperwork given and verbally understood. 

## 2022-12-04 NOTE — ED Triage Notes (Addendum)
Pt was seen at [facility] due to a sudden feeling of pressure in head and chest that lasted about a minute and occurred around 9:30 this am.  Pt has d/c instructions that say to come here, he tells me that his EKG was abnormal at [facility].  Pain free at this time

## 2022-12-05 ENCOUNTER — Encounter: Payer: Self-pay | Admitting: Nurse Practitioner

## 2022-12-17 ENCOUNTER — Ambulatory Visit: Payer: 59 | Attending: Nurse Practitioner

## 2022-12-17 VITALS — Ht 66.0 in | Wt 192.0 lb

## 2022-12-17 DIAGNOSIS — Z0000 Encounter for general adult medical examination without abnormal findings: Secondary | ICD-10-CM
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
## 2022-12-17 NOTE — Patient Instructions (Signed)
Corey Guerrero , Thank you for taking time to come for your Medicare Wellness Visit. I appreciate your ongoing commitment to your health goals. Please review the following plan we discussed and let me know if I can assist you in the future.   These are the goals we discussed:  Goals      Blood Pressure < 140/90     Remain active and independent        This is a list of the screening recommended for you and due dates:  Health Maintenance  Topic Date Due   COVID-19 Vaccine (4 - 2023-24 season) 03/01/2022   Zoster (Shingles) Vaccine (1 of 2) 01/17/2023*   Pneumonia Vaccine (2 of 2 - PCV) 05/10/2023*   Flu Shot  01/30/2023   Medicare Annual Wellness Visit  12/17/2023   DTaP/Tdap/Td vaccine (3 - Td or Tdap) 11/01/2027   Colon Cancer Screening  09/08/2028   Hepatitis C Screening  Completed   HIV Screening  Completed   HPV Vaccine  Aged Out  *Topic was postponed. The date shown is not the original due date.    Advanced directives: Information on Advanced Care Planning can be found at [facility] Advance Health Care Directives Advance Health Care Directives ([email or web address] Please bring a copy of your health care power of attorney and living will to the office to be added to your chart at your convenience.  Conditions/risks identified: Aim for 30 minutes of exercise or brisk walking, 6-8 glasses of water, and 5 servings of fruits and vegetables each day.  Next appointment: Follow up in one year for your annual wellness visit.   Please coming fasting to your next appointment on 01/27/23 @ 9:10  Preventive Care 65 Years and Older, Male  Preventive care refers to lifestyle choices and visits with your health care provider that can promote health and wellness. What does preventive care include? A yearly physical exam. This is also called an annual well check. Dental exams once or twice a year. Routine eye exams. Ask your health care provider how often you should have your  eyes checked. Personal lifestyle choices, including: Daily care of your teeth and gums. Regular physical activity. Eating a healthy diet. Avoiding tobacco and drug use. Limiting alcohol use. Practicing safe sex. Taking low doses of aspirin every day. Taking vitamin and mineral supplements as recommended by your health care provider. What happens during an annual well check? The services and screenings done by your health care provider during your annual well check will depend on your age, overall health, lifestyle risk factors, and family history of disease. Counseling  Your health care provider may ask you questions about your: Alcohol use. Tobacco use. Drug use. Emotional well-being. Home and relationship well-being. Sexual activity. Eating habits. History of falls. Memory and ability to understand (cognition). Work and work Astronomer. Screening  You may have the following tests or measurements: Height, weight, and BMI. Blood pressure. Lipid and cholesterol levels. These may be checked every 5 years, or more frequently if you are over 3 years old. Skin check. Lung cancer screening. You may have this screening every year starting at age 2 if you have a 30-pack-year history of smoking and currently smoke or have quit within the past 15 years. Fecal occult blood test (FOBT) of the stool. You may have this test every year starting at age 38. Flexible sigmoidoscopy or colonoscopy. You may have a sigmoidoscopy every 5 years or a colonoscopy every 10 years starting at  age 61. Prostate cancer screening. Recommendations will vary depending on your family history and other risks. Hepatitis C blood test. Hepatitis B blood test. Sexually transmitted disease (STD) testing. Diabetes screening. This is done by checking your blood sugar (glucose) after you have not eaten for a while (fasting). You may have this done every 1-3 years. Abdominal aortic aneurysm (AAA) screening. You may need  this if you are a current or former smoker. Osteoporosis. You may be screened starting at age 83 if you are at high risk. Talk with your health care provider about your test results, treatment options, and if necessary, the need for more tests. Vaccines  Your health care provider may recommend certain vaccines, such as: Influenza vaccine. This is recommended every year. Tetanus, diphtheria, and acellular pertussis (Tdap, Td) vaccine. You may need a Td booster every 10 years. Zoster vaccine. You may need this after age 62. Pneumococcal 13-valent conjugate (PCV13) vaccine. One dose is recommended after age 37. Pneumococcal polysaccharide (PPSV23) vaccine. One dose is recommended after age 26. Talk to your health care provider about which screenings and vaccines you need and how often you need them. This information is not intended to replace advice given to you by your health care provider. Make sure you discuss any questions you have with your health care provider. Document Released: 07/14/2015 Document Revised: 03/06/2016 Document Reviewed: 04/18/2015 Elsevier Interactive Patient Education  2017 [employer].  Fall Prevention in the Home Falls can cause injuries. They can happen to people of all ages. There are many things you can do to make your home safe and to help prevent falls. What can I do on the outside of my home? Regularly fix the edges of walkways and driveways and fix any cracks. Remove anything that might make you trip as you walk through a door, such as a raised step or threshold. Trim any bushes or trees on the path to your home. Use bright outdoor lighting. Clear any walking paths of anything that might make someone trip, such as rocks or tools. Regularly check to see if handrails are loose or broken. Make sure that both sides of any steps have handrails. Any raised decks and porches should have guardrails on the edges. Have any leaves, snow, or ice cleared regularly. Use  sand or salt on walking paths during winter. Clean up any spills in your garage right away. This includes oil or grease spills. What can I do in the bathroom? Use night lights. Install grab bars by the toilet and in the tub and shower. Do not use towel bars as grab bars. Use non-skid mats or decals in the tub or shower. If you need to sit down in the shower, use a plastic, non-slip stool. Keep the floor dry. Clean up any water that spills on the floor as soon as it happens. Remove soap buildup in the tub or shower regularly. Attach bath mats securely with double-sided non-slip rug tape. Do not have throw rugs and other things on the floor that can make you trip. What can I do in the bedroom? Use night lights. Make sure that you have a light by your bed that is easy to reach. Do not use any sheets or blankets that are too big for your bed. They should not hang down onto the floor. Have a firm chair that has side arms. You can use this for support while you get dressed. Do not have throw rugs and other things on the floor that can make  you trip. What can I do in the kitchen? Clean up any spills right away. Avoid walking on wet floors. Keep items that you use a lot in easy-to-reach places. If you need to reach something above you, use a strong step stool that has a grab bar. Keep electrical cords out of the way. Do not use floor polish or wax that makes floors slippery. If you must use wax, use non-skid floor wax. Do not have throw rugs and other things on the floor that can make you trip. What can I do with my stairs? Do not leave any items on the stairs. Make sure that there are handrails on both sides of the stairs and use them. Fix handrails that are broken or loose. Make sure that handrails are as long as the stairways. Check any carpeting to make sure that it is firmly attached to the stairs. Fix any carpet that is loose or worn. Avoid having throw rugs at the top or bottom of the  stairs. If you do have throw rugs, attach them to the floor with carpet tape. Make sure that you have a light switch at the top of the stairs and the bottom of the stairs. If you do not have them, ask someone to add them for you. What else can I do to help prevent falls? Wear shoes that: Do not have high heels. Have rubber bottoms. Are comfortable and fit you well. Are closed at the toe. Do not wear sandals. If you use a stepladder: Make sure that it is fully opened. Do not climb a closed stepladder. Make sure that both sides of the stepladder are locked into place. Ask someone to hold it for you, if possible. Clearly mark and make sure that you can see: Any grab bars or handrails. First and last steps. Where the edge of each step is. Use tools that help you move around (mobility aids) if they are needed. These include: Canes. Walkers. Scooters. Crutches. Turn on the lights when you go into a dark area. Replace any light bulbs as soon as they burn out. Set up your furniture so you have a clear path. Avoid moving your furniture around. If any of your floors are uneven, fix them. If there are any pets around you, be aware of where they are. Review your medicines with your doctor. Some medicines can make you feel dizzy. This can increase your chance of falling. Ask your doctor what other things that you can do to help prevent falls. This information is not intended to replace advice given to you by your health care provider. Make sure you discuss any questions you have with your health care provider. Document Released: 04/13/2009 Document Revised: 11/23/2015 Document Reviewed: 07/22/2014 Elsevier Interactive Patient Education  2017 [employer].

## 2022-12-22 NOTE — Progress Notes (Signed)
I have reviewed and agree with the AWV documentation Attestation signed by Jaleel Allen FNP-BC on  12-22-2022 

## 2022-12-24 ENCOUNTER — Encounter (HOSPITAL_BASED_OUTPATIENT_CLINIC_OR_DEPARTMENT_OTHER): Payer: Self-pay | Admitting: Emergency Medicine

## 2022-12-24 ENCOUNTER — Other Ambulatory Visit: Payer: Self-pay

## 2022-12-24 ENCOUNTER — Other Ambulatory Visit (HOSPITAL_BASED_OUTPATIENT_CLINIC_OR_DEPARTMENT_OTHER): Payer: Self-pay

## 2022-12-24 ENCOUNTER — Emergency Department (HOSPITAL_BASED_OUTPATIENT_CLINIC_OR_DEPARTMENT_OTHER): Payer: 59 | Admitting: Radiology

## 2022-12-24 ENCOUNTER — Emergency Department (HOSPITAL_BASED_OUTPATIENT_CLINIC_OR_DEPARTMENT_OTHER)
Admission: EM | Admit: 2022-12-24 | Discharge: 2022-12-24 | Disposition: A | Discharge status: Home / Self Care | Payer: 59 | Attending: Emergency Medicine | Admitting: Emergency Medicine

## 2022-12-24 DIAGNOSIS — R0782 Intercostal pain: Secondary | ICD-10-CM | POA: Diagnosis present

## 2022-12-24 DIAGNOSIS — S20211A Contusion of right front wall of thorax, initial encounter: Secondary | ICD-10-CM | POA: Insufficient documentation

## 2022-12-24 DIAGNOSIS — W06XXXA Fall from bed, initial encounter: Secondary | ICD-10-CM | POA: Diagnosis not present

## 2022-12-24 MED ORDER — TIZANIDINE HCL 4 MG PO CAPS
4.0000 mg | ORAL_CAPSULE | Freq: Two times a day (BID) | ORAL | 0 refills | Status: DC | PRN
  Filled 2022-12-24: qty 20, 10d supply, fill #0

## 2022-12-24 MED ORDER — LIDOCAINE HCL 4 % EX SOLN: Freq: Once | CUTANEOUS | Status: DC

## 2022-12-24 MED ORDER — ACETAMINOPHEN 325 MG PO TABS
650.0000 mg | ORAL_TABLET | Freq: Once | ORAL | Status: AC
  Administered 2022-12-24: 650 mg via ORAL
  Filled 2022-12-24: qty 2

## 2022-12-24 MED ORDER — LIDOCAINE 5 % EX PTCH
1.0000 | MEDICATED_PATCH | Freq: Every day | CUTANEOUS | Status: DC
  Administered 2022-12-24: 1 via TRANSDERMAL
  Filled 2022-12-24: qty 1

## 2022-12-24 MED ORDER — ACETAMINOPHEN 500 MG PO TABS
500.0000 mg | ORAL_TABLET | Freq: Four times a day (QID) | ORAL | 0 refills | Status: AC | PRN
  Filled 2022-12-24: qty 30, 8d supply, fill #0

## 2022-12-24 NOTE — ED Provider Notes (Signed)
[place] EMERGENCY DEPARTMENT AT [facility] Provider Note   CSN: [id number] Arrival date & time: 12/24/22  1191     History  Chief Complaint  Patient presents with   Corey Guerrero is a 67 y.o. male.  HPI     Home Medications Prior to Admission medications   Medication Sig Start Date End Date Taking? Authorizing Provider  acetaminophen (TYLENOL) 500 MG tablet Take 1 tablet (500 mg total) by mouth every 6 (six) hours as needed. 12/24/22  Yes Derwood Kaplan, MD  tiZANidine (ZANAFLEX) 4 MG capsule Take 1 capsule (4 mg total) by mouth 2 (two) times daily as needed for muscle spasms. 12/24/22  Yes Lilya Smitherman, MD  amLODipine (NORVASC) 10 MG tablet Take 1 tablet (10 mg total) by mouth daily. 07/17/22   Claiborne Rigg, NP  aspirin 81 MG chewable tablet Chew 1 tablet (81 mg total) by mouth daily. 07/17/22   Claiborne Rigg, NP  atorvastatin (LIPITOR) 40 MG tablet Take 1 tablet (40 mg total) by mouth daily. 07/17/22   Claiborne Rigg, NP  carvedilol (COREG) 12.5 MG tablet Take 1 tablet (12.5 mg total) by mouth 2 (two) times daily with a meal. 07/17/22   Claiborne Rigg, NP  cloNIDine (CATAPRES) 0.1 MG tablet Take 1 tablet (0.1 mg total) by mouth 2 (two) times daily. 07/17/22   Claiborne Rigg, NP  fluticasone (FLONASE) 50 MCG/ACT nasal spray Place 2 sprays into both nostrils daily. 10/31/22   Claiborne Rigg, NP  lisinopril (ZESTRIL) 20 MG tablet Take 1 tablet (20 mg total) by mouth daily. 07/17/22   Claiborne Rigg, NP  multivitamin-iron-minerals-folic acid (CENTRUM) chewable tablet Chew 1 tablet by mouth daily.    [provider]  omeprazole (PRILOSEC) 20 MG capsule Take 1 capsule (20 mg total) by mouth daily. 07/17/22   Claiborne Rigg, NP  sildenafil (VIAGRA) 100 MG tablet Take 0.5-1 tablets (50-100 mg total) by mouth daily as needed for erectile dysfunction. 12/28/21   Claiborne Rigg, NP      Allergies    Bee venom and Desyrel [trazodone hcl]     Review of Systems   Review of Systems  Physical Exam Updated Vital Signs BP (!) 158/108 (BP Location: Right Arm)   Pulse (!) 46   Temp 97.7 F (36.5 C) (Oral)   Resp 18   Ht 5\' 6"  (1.676 m)   Wt 87.1 kg   SpO2 96%   BMI 30.99 kg/m  Physical Exam Vitals and nursing note reviewed.  Constitutional:      Appearance: He is well-developed.  HENT:     Head: Atraumatic.  Cardiovascular:     Rate and Rhythm: Normal rate.  Pulmonary:     Effort: Pulmonary effort is normal.  Musculoskeletal:     Cervical back: Neck supple.     Comments: Reproducible chest wall tenderness in the mid axillary line and anterior axillary line at the level of nipple  Skin:    General: Skin is warm.  Neurological:     Mental Status: He is alert and oriented to person, place, and time.     ED Results / Procedures / Treatments   Labs (all labs ordered are listed, but only abnormal results are displayed) Labs Reviewed - No data to display  EKG None  Radiology DG Shoulder Right  Result Date: 12/24/2022 CLINICAL DATA:  Right shoulder pain after falling out of bed a week ago. EXAM: RIGHT SHOULDER -  2+ VIEW COMPARISON:  Right shoulder x-rays dated January 22, 2021. FINDINGS: No acute fracture or dislocation. Similar mild glenohumeral and moderate acromioclavicular degenerative changes. Soft tissues are unremarkable. IMPRESSION: 1. No acute osseous abnormality. Electronically Signed   By: Obie Dredge M.D.   On: 12/24/2022 09:06   DG Ribs Unilateral W/Chest Right  Result Date: 12/24/2022 CLINICAL DATA:  Right anterior rib pain after rolling out of bed week ago. EXAM: RIGHT RIBS AND CHEST - 3+ VIEW COMPARISON:  Chest x-ray dated December 04, 2022. FINDINGS: No fracture or other bone lesions are seen involving the ribs. There is no evidence of pneumothorax or pleural effusion. Both lungs are clear. Heart size and mediastinal contours are within normal limits. IMPRESSION: Negative. Electronically Signed   By:  Obie Dredge M.D.   On: 12/24/2022 09:05    Procedures Procedures    Medications Ordered in ED Medications  lidocaine (LIDODERM) 5 % 1 patch (1 patch Transdermal Patch Applied 12/24/22 1044)  acetaminophen (TYLENOL) tablet 650 mg (650 mg Oral Given 12/24/22 1043)    ED Course/ Medical Decision Making/ A&P                             Medical Decision Making Amount and/or Complexity of Data Reviewed Radiology: ordered.  Risk OTC drugs. Prescription drug management.   67 year old male comes in with chief complaint of right-sided chest wall pain.  He indicates that he had a mechanical fall a few days back, and has subsequently had pain on the right side of the chest wall.  Differential diagnosis for him includes rib contusion, chest wall contusion, rib fracture.  Pain worse with deep inspiration, therefore pneumothorax also in the differential, but thought to be less likely.  Hematoma also another possibility.  X-ray of the ribs were ordered, I have independently interpreted the x-ray of the ribs.  There is no evidence of fracture.  Results discussed with the patient.  Will treat this as contusion.   Final Clinical Impression(s) / ED Diagnoses Final diagnoses:  Contusion of rib on right side, initial encounter    Rx / DC Orders ED Discharge Orders          Ordered    acetaminophen (TYLENOL) 500 MG tablet  Every 6 hours PRN        12/24/22 1049    tiZANidine (ZANAFLEX) 4 MG capsule  2 times daily PRN        12/24/22 1049              Derwood Kaplan, MD 12/24/22 1124

## 2022-12-24 NOTE — ED Triage Notes (Signed)
Pt arrives to ED with c/o fall that occurred x1 week ago when he fell out of bed. Pt notes right sided ribcage and shoulder pain.

## 2022-12-24 NOTE — Discharge Instructions (Addendum)
The x-ray of your ribs is reassuring.  We suspect that you most likely have bruising to the ribs or to the muscle area.  As discussed, alternate between ice and cold therapy every 2-3 hours for the next 2 days.   Transition to heat therapy there after.

## 2022-12-25 ENCOUNTER — Other Ambulatory Visit (HOSPITAL_BASED_OUTPATIENT_CLINIC_OR_DEPARTMENT_OTHER): Payer: Self-pay

## 2022-12-26 ENCOUNTER — Other Ambulatory Visit: Payer: Self-pay

## 2022-12-26 ENCOUNTER — Other Ambulatory Visit: Payer: Self-pay | Admitting: Nurse Practitioner

## 2022-12-26 DIAGNOSIS — K219 Gastro-esophageal reflux disease without esophagitis: Secondary | ICD-10-CM

## 2022-12-26 MED ORDER — OMEPRAZOLE 20 MG PO CPDR
20.0000 mg | DELAYED_RELEASE_CAPSULE | Freq: Every day | ORAL | 0 refills | Status: DC
  Filled 2022-12-26 – 2023-05-22 (×3): qty 90, 90d supply, fill #0

## 2022-12-30 ENCOUNTER — Other Ambulatory Visit: Payer: Self-pay

## 2023-01-07 ENCOUNTER — Other Ambulatory Visit: Payer: Self-pay

## 2023-01-20 ENCOUNTER — Emergency Department (HOSPITAL_BASED_OUTPATIENT_CLINIC_OR_DEPARTMENT_OTHER)
Admission: EM | Admit: 2023-01-20 | Discharge: 2023-01-20 | Disposition: A | Discharge status: Home / Self Care | Payer: 59 | Attending: Emergency Medicine | Admitting: Emergency Medicine

## 2023-01-20 ENCOUNTER — Encounter (HOSPITAL_BASED_OUTPATIENT_CLINIC_OR_DEPARTMENT_OTHER): Payer: Self-pay | Admitting: Emergency Medicine

## 2023-01-20 ENCOUNTER — Other Ambulatory Visit: Payer: Self-pay

## 2023-01-20 ENCOUNTER — Other Ambulatory Visit (HOSPITAL_BASED_OUTPATIENT_CLINIC_OR_DEPARTMENT_OTHER): Payer: Self-pay

## 2023-01-20 DIAGNOSIS — Z7982 Long term (current) use of aspirin: Secondary | ICD-10-CM | POA: Diagnosis not present

## 2023-01-20 DIAGNOSIS — S80862A Insect bite (nonvenomous), left lower leg, initial encounter: Secondary | ICD-10-CM | POA: Insufficient documentation

## 2023-01-20 DIAGNOSIS — I129 Hypertensive chronic kidney disease with stage 1 through stage 4 chronic kidney disease, or unspecified chronic kidney disease: Secondary | ICD-10-CM | POA: Diagnosis not present

## 2023-01-20 DIAGNOSIS — L03116 Cellulitis of left lower limb: Secondary | ICD-10-CM | POA: Insufficient documentation

## 2023-01-20 DIAGNOSIS — W57XXXA Bitten or stung by nonvenomous insect and other nonvenomous arthropods, initial encounter: Secondary | ICD-10-CM | POA: Insufficient documentation

## 2023-01-20 DIAGNOSIS — Z79899 Other long term (current) drug therapy: Secondary | ICD-10-CM | POA: Diagnosis not present

## 2023-01-20 DIAGNOSIS — N189 Chronic kidney disease, unspecified: Secondary | ICD-10-CM | POA: Diagnosis not present

## 2023-01-20 LAB — BASIC METABOLIC PANEL
Anion gap: 8 (ref 5–15)
BUN: 27 mg/dL — ABNORMAL HIGH (ref 8–23)
CO2: 26 mmol/L (ref 22–32)
Calcium: 9.3 mg/dL (ref 8.9–10.3)
Chloride: 105 mmol/L (ref 98–111)
Creatinine, Ser: 1.63 mg/dL — ABNORMAL HIGH (ref 0.61–1.24)
GFR, Estimated: 46 mL/min — ABNORMAL LOW (ref >60)
Glucose, Bld: 73 mg/dL (ref 70–99)
Potassium: 3.5 mmol/L (ref 3.5–5.1)
Sodium: 139 mmol/L (ref 135–145)

## 2023-01-20 LAB — CBC
HCT: 45.3 % (ref 39.0–52.0)
Hemoglobin: 15.8 g/dL (ref 13.0–17.0)
MCH: 31.6 pg (ref 26.0–34.0)
MCHC: 34.9 g/dL (ref 30.0–36.0)
MCV: 90.6 fL (ref 80.0–100.0)
Platelets: 175 10*3/uL (ref 150–400)
RBC: 5 MIL/uL (ref 4.22–5.81)
RDW: 12.8 % (ref 11.5–15.5)
WBC: 6 10*3/uL (ref 4.0–10.5)
nRBC: 0 % (ref 0.0–0.2)

## 2023-01-20 MED ORDER — CEPHALEXIN 500 MG PO CAPS: 500.0000 mg | ORAL_CAPSULE | Freq: Four times a day (QID) | ORAL | 0 refills | Status: DC

## 2023-01-20 MED ORDER — CEPHALEXIN 500 MG PO CAPS
500.0000 mg | ORAL_CAPSULE | Freq: Four times a day (QID) | ORAL | 0 refills | Status: DC
  Filled 2023-01-20: qty 20, 5d supply, fill #0

## 2023-01-20 NOTE — ED Provider Notes (Signed)
[place] EMERGENCY DEPARTMENT AT [facility] Provider Note   CSN: [id number] Arrival date & time: 01/20/23  1531     History  Chief Complaint  Patient presents with   Leg Swelling    Corey Guerrero is a 67 y.o. male.  HPI   67 year old male with medical history significant for hypertension, polysubstance abuse, CKD, OSA not on CPAP who presents to the emergency department with left leg pain and swelling after an insect bite.  The patient states that 3 days ago he was bit by an unspecified insight.  He states that he has had some surrounding pain and swelling associated with the bite.  The bite itself has scabbed over.  He denies any fevers or chills.  He denies any whole leg swelling or cramping in his calf.  No history of DVT or PE.  He is not on anticoagulation.  Swelling has not rapidly progressed.  Home Medications Prior to Admission medications   Medication Sig Start Date End Date Taking? Authorizing Provider  acetaminophen (TYLENOL) 500 MG tablet Take 1 tablet (500 mg total) by mouth every 6 (six) hours as needed. 12/24/22   Derwood Kaplan, MD  amLODipine (NORVASC) 10 MG tablet Take 1 tablet (10 mg total) by mouth daily. 07/17/22   Claiborne Rigg, NP  aspirin 81 MG chewable tablet Chew 1 tablet (81 mg total) by mouth daily. 07/17/22   Claiborne Rigg, NP  atorvastatin (LIPITOR) 40 MG tablet Take 1 tablet (40 mg total) by mouth daily. 07/17/22   Claiborne Rigg, NP  carvedilol (COREG) 12.5 MG tablet Take 1 tablet (12.5 mg total) by mouth 2 (two) times daily with a meal. 07/17/22   Claiborne Rigg, NP  cephALEXin (KEFLEX) 500 MG capsule Take 1 capsule (500 mg total) by mouth 4 (four) times daily. 01/20/23   Ernie Avena, MD  cloNIDine (CATAPRES) 0.1 MG tablet Take 1 tablet (0.1 mg total) by mouth 2 (two) times daily. 07/17/22   Claiborne Rigg, NP  fluticasone (FLONASE) 50 MCG/ACT nasal spray Place 2 sprays into both nostrils daily. 10/31/22   Claiborne Rigg, NP   lisinopril (ZESTRIL) 20 MG tablet Take 1 tablet (20 mg total) by mouth daily. 07/17/22   Claiborne Rigg, NP  multivitamin-iron-minerals-folic acid (CENTRUM) chewable tablet Chew 1 tablet by mouth daily.    [provider]  omeprazole (PRILOSEC) 20 MG capsule Take 1 capsule (20 mg total) by mouth daily. 12/26/22   Hoy Register, MD  sildenafil (VIAGRA) 100 MG tablet Take 0.5-1 tablets (50-100 mg total) by mouth daily as needed for erectile dysfunction. 12/28/21   Claiborne Rigg, NP  tiZANidine (ZANAFLEX) 4 MG capsule Take 1 capsule (4 mg total) by mouth 2 (two) times daily as needed for muscle spasms. 12/24/22   Derwood Kaplan, MD      Allergies    Bee venom and Desyrel [trazodone hcl]    Review of Systems   Review of Systems  All other systems reviewed and are negative.   Physical Exam Updated Vital Signs BP (!) 143/98 (BP Location: Right Arm)   Pulse (!) 52   Temp 98.3 F (36.8 C) (Oral)   Resp 15   Ht 5\' 6"  (1.676 m)   Wt 85.7 kg   SpO2 95%   BMI 30.51 kg/m  Physical Exam Vitals and nursing note reviewed.  Constitutional:      General: He is not in acute distress.    Appearance: He is well-developed.  HENT:     Head: Normocephalic and atraumatic.  Eyes:     Conjunctiva/sclera: Conjunctivae normal.  Cardiovascular:     Rate and Rhythm: Normal rate and regular rhythm.  Pulmonary:     Effort: Pulmonary effort is normal. No respiratory distress.  Abdominal:     General: There is no distension.     Palpations: Abdomen is soft.  Musculoskeletal:        General: Tenderness present.     Cervical back: Neck supple.     Comments: Right leg: Small scabbed over area that appears to be an insect bite, surrounding tenderness, mild erythema and warmth, no significant fluctuance, no crepitus.  No whole leg swelling, intact distal pulses  Skin:    General: Skin is warm and dry.     Capillary Refill: Capillary refill takes less than 2 seconds.  Neurological:      Mental Status: He is alert.  Psychiatric:        Mood and Affect: Mood normal.     ED Results / Procedures / Treatments   Labs (all labs ordered are listed, but only abnormal results are displayed) Labs Reviewed  BASIC METABOLIC PANEL - Abnormal; Notable for the following components:      Result Value   BUN 27 (*)    Creatinine, Ser 1.63 (*)    GFR, Estimated 46 (*)    All other components within normal limits  CBC    EKG None  Radiology No results found.  Procedures Procedures    Medications Ordered in ED Medications - No data to display  ED Course/ Medical Decision Making/ A&P                             Medical Decision Making Amount and/or Complexity of Data Reviewed Labs: ordered.  Risk Prescription drug management.    67 year old male with medical history significant for hypertension, polysubstance abuse, CKD, OSA not on CPAP who presents to the emergency department with left leg pain and swelling after an insect bite.  The patient states that 3 days ago he was bit by an unspecified insight.  He states that he has had some surrounding pain and swelling associated with the bite.  The bite itself has scabbed over.  He denies any fevers or chills.  He denies any whole leg swelling or cramping in his calf.  No history of DVT or PE.  He is not on anticoagulation.  Swelling has not rapidly progressed.  Arrival, the patient was vitally stable, afebrile, mildly bradycardic heart rate 52, BP 143/98, saturating 95% on room air.  Physical exam revealed evidence of mild erythema and swelling surrounding what appears to be an insect bite.  No whole leg swelling or cramping.  Intact distal pulses.  Suspect likely developing mild cellulitis surrounding an insect bite.  No crepitus to suggest necrotizing fasciitis.  No fluctuance to suggest abscess.  Low concern for DVT.  Patient denies any other symptoms at this time.  No systemic signs of infection.  CBC without a leukocytosis  or anemia, BMP with creatinine at baseline, no significant electrolyte abnormality.  Will initiate antibiotic coverage with keflex.  Return precautions provided, stable for discharge.    Final Clinical Impression(s) / ED Diagnoses Final diagnoses:  Cellulitis of left lower extremity  Insect bite of left lower leg, initial encounter    Rx / DC Orders ED Discharge Orders  Ordered    cephALEXin (KEFLEX) 500 MG capsule  4 times daily,   Status:  Discontinued        01/20/23 1611    cephALEXin (KEFLEX) 500 MG capsule  4 times daily        01/20/23 1703              Ernie Avena, MD 01/20/23 [phone]

## 2023-01-20 NOTE — ED Triage Notes (Signed)
Pt arrives to ED with c/o left leg swelling after insect bite x3 days ago.

## 2023-01-20 NOTE — Discharge Instructions (Addendum)
Take antibiotics as prescribed, follow-up with your PCP to ensure resolution, return to the emergency department if you have any severe worsening of symptoms despite your outpatient antibiotic regimen.

## 2023-01-20 NOTE — ED Notes (Signed)
Discharge instructions, follow up care, and prescription reviewed and explained, pt verbalized understanding and had no further questions on d/c. Pt caox4, ambulatory, NAD on d/c.  

## 2023-01-27 ENCOUNTER — Encounter: Payer: Self-pay | Admitting: Nurse Practitioner

## 2023-01-27 ENCOUNTER — Ambulatory Visit: Payer: 59 | Attending: Nurse Practitioner | Admitting: Nurse Practitioner

## 2023-01-27 ENCOUNTER — Other Ambulatory Visit: Payer: Self-pay

## 2023-01-27 VITALS — BP 133/89 | HR 52 | Ht 66.0 in | Wt 189.0 lb

## 2023-01-27 DIAGNOSIS — E785 Hyperlipidemia, unspecified: Secondary | ICD-10-CM

## 2023-01-27 DIAGNOSIS — G4733 Obstructive sleep apnea (adult) (pediatric): Secondary | ICD-10-CM

## 2023-01-27 DIAGNOSIS — I10 Essential (primary) hypertension: Secondary | ICD-10-CM | POA: Diagnosis not present

## 2023-01-27 DIAGNOSIS — R350 Frequency of micturition: Secondary | ICD-10-CM

## 2023-01-27 DIAGNOSIS — R0981 Nasal congestion: Secondary | ICD-10-CM

## 2023-01-27 MED ORDER — CLONIDINE HCL 0.1 MG PO TABS
0.1000 mg | ORAL_TABLET | Freq: Two times a day (BID) | ORAL | 1 refills | Status: DC
Start: 2023-01-27 — End: 2023-11-05
  Filled 2023-01-27 – 2023-04-07 (×2): qty 180, 90d supply, fill #0
  Filled 2023-07-28: qty 180, 90d supply, fill #1

## 2023-01-27 MED ORDER — FLUTICASONE PROPIONATE 50 MCG/ACT NA SUSP
2.0000 | Freq: Every day | NASAL | 0 refills | Status: DC
Start: 2023-01-27 — End: 2023-04-29
  Filled 2023-01-27: qty 16, 30d supply, fill #0

## 2023-01-27 MED ORDER — CARVEDILOL 12.5 MG PO TABS
12.5000 mg | ORAL_TABLET | Freq: Two times a day (BID) | ORAL | 1 refills | Status: DC
Start: 2023-01-27 — End: 2023-12-23
  Filled 2023-01-27 – 2023-03-24 (×2): qty 180, 90d supply, fill #0
  Filled 2023-07-28 – 2023-09-23 (×3): qty 180, 90d supply, fill #1

## 2023-01-27 MED ORDER — AMLODIPINE BESYLATE 10 MG PO TABS
10.0000 mg | ORAL_TABLET | Freq: Every day | ORAL | 1 refills | Status: DC
  Filled 2023-01-27: qty 90, 90d supply, fill #0

## 2023-01-27 MED ORDER — ATORVASTATIN CALCIUM 40 MG PO TABS
40.0000 mg | ORAL_TABLET | Freq: Every day | ORAL | 1 refills | Status: DC
Start: 2023-01-27 — End: 2023-09-24
  Filled 2023-01-27 – 2023-09-23 (×6): qty 90, 90d supply, fill #0

## 2023-01-27 MED ORDER — LISINOPRIL 20 MG PO TABS
20.0000 mg | ORAL_TABLET | Freq: Every day | ORAL | 1 refills | Status: DC
Start: 2023-01-27 — End: 2023-03-18
  Filled 2023-01-27: qty 90, 90d supply, fill #0

## 2023-01-27 NOTE — Progress Notes (Signed)
Patient was bitten by an insect bite on outer left ankle  Patient had received antibiotics and states that the site is still sore to touch and swollen.

## 2023-01-27 NOTE — Patient Instructions (Addendum)
[facility] at [facility] clinic in [place], [place] Address: [address], [place], [place] [address] Phone: [phone]  [facility] center in [place], [place] Address: [address] [place], [place] [address] Phone: [phone]
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
## 2023-02-03 ENCOUNTER — Other Ambulatory Visit: Payer: Self-pay

## 2023-02-11 ENCOUNTER — Other Ambulatory Visit: Payer: Self-pay

## 2023-02-11 MED ORDER — OMEPRAZOLE 20 MG PO CPDR
20.0000 mg | DELAYED_RELEASE_CAPSULE | Freq: Every morning | ORAL | 3 refills | Status: DC
  Filled 2023-02-11: qty 90, 90d supply, fill #0

## 2023-02-18 ENCOUNTER — Other Ambulatory Visit: Payer: Self-pay

## 2023-02-20 ENCOUNTER — Other Ambulatory Visit: Payer: Self-pay

## 2023-02-20 ENCOUNTER — Encounter: Payer: Self-pay | Admitting: Nurse Practitioner

## 2023-02-20 MED ORDER — CLONIDINE HCL 0.1 MG PO TABS
0.1000 mg | ORAL_TABLET | Freq: Two times a day (BID) | ORAL | 3 refills | Status: DC
  Filled 2023-03-24 – 2023-07-28 (×4): qty 180, 90d supply, fill #0

## 2023-02-20 MED ORDER — LISINOPRIL 10 MG PO TABS
10.0000 mg | ORAL_TABLET | Freq: Every day | ORAL | 3 refills | Status: DC
  Filled 2023-03-24: qty 90, 90d supply, fill #0
  Filled 2023-06-19: qty 90, 90d supply, fill #1
  Filled 2023-09-23: qty 90, 90d supply, fill #2
  Filled 2023-12-01: qty 90, 90d supply, fill #3

## 2023-02-20 MED ORDER — ATORVASTATIN CALCIUM 40 MG PO TABS
40.0000 mg | ORAL_TABLET | Freq: Every evening | ORAL | 3 refills | Status: DC
  Filled 2023-04-03: qty 90, 90d supply, fill #0
  Filled 2023-07-28: qty 90, 90d supply, fill #1
  Filled 2023-11-01: qty 90, 90d supply, fill #2
  Filled 2024-01-24: qty 90, 90d supply, fill #3

## 2023-02-20 MED ORDER — AMLODIPINE BESYLATE 10 MG PO TABS
10.0000 mg | ORAL_TABLET | Freq: Every morning | ORAL | 3 refills | Status: DC
  Filled 2023-03-24: qty 90, 90d supply, fill #0
  Filled 2023-06-09: qty 90, 90d supply, fill #1
  Filled 2023-09-10: qty 90, 90d supply, fill #2
  Filled 2023-12-01: qty 90, 90d supply, fill #3

## 2023-02-20 MED ORDER — CARVEDILOL 12.5 MG PO TABS
12.5000 mg | ORAL_TABLET | Freq: Two times a day (BID) | ORAL | 3 refills | Status: DC
  Filled 2023-06-09 – 2023-07-05 (×2): qty 180, 90d supply, fill #0

## 2023-03-01 LAB — LAB REPORT - SCANNED
Albumin, Urine POC: 45.1
Creatinine, POC: 106 mg/dL
EGFR: 41
HM Hepatitis Screen: NEGATIVE
Microalb Creat Ratio: 425
PSA, Total: 1.29

## 2023-03-04 ENCOUNTER — Other Ambulatory Visit: Payer: Self-pay

## 2023-03-04 MED ORDER — JARDIANCE 10 MG PO TABS
ORAL_TABLET | ORAL | 3 refills | Status: DC
  Filled 2023-03-04 – 2023-03-24 (×2): qty 90, 90d supply, fill #0
  Filled 2023-07-05: qty 90, 90d supply, fill #1
  Filled 2023-09-08 – 2023-09-23 (×2): qty 90, 90d supply, fill #2
  Filled 2023-12-01: qty 90, 90d supply, fill #3

## 2023-03-11 ENCOUNTER — Other Ambulatory Visit: Payer: Self-pay
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
## 2023-03-18 ENCOUNTER — Encounter: Payer: Self-pay | Admitting: Adult Health

## 2023-03-18 ENCOUNTER — Ambulatory Visit: Payer: 59 | Attending: Adult Health | Admitting: Adult Health

## 2023-03-18 VITALS — BP 126/88 | HR 90 | Ht 66.0 in | Wt 189.6 lb

## 2023-03-18 DIAGNOSIS — N184 Chronic kidney disease, stage 4 (severe): Secondary | ICD-10-CM

## 2023-03-18 DIAGNOSIS — I10 Essential (primary) hypertension: Secondary | ICD-10-CM | POA: Diagnosis not present

## 2023-03-18 DIAGNOSIS — E7800 Pure hypercholesterolemia, unspecified: Secondary | ICD-10-CM | POA: Diagnosis not present

## 2023-03-18 NOTE — Patient Instructions (Signed)
Medication Instructions:  No changes *If you need a refill on your cardiac medications before your next appointment, please call your pharmacy*   Lab Work: No Labs If you have labs (blood work) drawn today and your tests are completely normal, you will receive your results only by: MyChart Message (if you have MyChart) OR A paper copy in the mail If you have any lab test that is abnormal or we need to change your treatment, we will call you to review the results.   Testing/Procedures: No Testing   Follow-Up: At [facility], you and your health needs are our priority.  As part of our continuing mission to provide you with exceptional heart care, we have created designated Provider Care Teams.  These Care Teams include your primary Cardiologist (physician) and Advanced Practice Providers (APPs -  Physician Assistants and Nurse Practitioners) who all work together to provide you with the care you need, when you need it.  We recommend signing up for the patient portal called "MyChart".  Sign up information is provided on this After Visit Summary.  MyChart is used to connect with patients for Virtual Visits (Telemedicine).  Patients are able to view lab/test results, encounter notes, upcoming appointments, etc.  Non-urgent messages can be sent to your provider as well.   To learn more about what you can do with MyChart, go to [email or web address].    Your next appointment:   1 year(s)  Provider:   Dietrich Pates, MD

## 2023-03-24 ENCOUNTER — Other Ambulatory Visit: Payer: Self-pay

## 2023-04-03 ENCOUNTER — Other Ambulatory Visit: Payer: Self-pay

## 2023-04-08 ENCOUNTER — Other Ambulatory Visit: Payer: Self-pay

## 2023-04-09 ENCOUNTER — Ambulatory Visit (HOSPITAL_BASED_OUTPATIENT_CLINIC_OR_DEPARTMENT_OTHER): Payer: 59 | Attending: Nurse Practitioner | Admitting: Internal Medicine

## 2023-04-09 VITALS — Ht 66.0 in | Wt 184.0 lb

## 2023-04-09 DIAGNOSIS — G4733 Obstructive sleep apnea (adult) (pediatric): Secondary | ICD-10-CM | POA: Diagnosis present

## 2023-04-13 DIAGNOSIS — G4733 Obstructive sleep apnea (adult) (pediatric): Secondary | ICD-10-CM | POA: Diagnosis not present

## 2023-04-13 NOTE — Procedures (Signed)
Patient Name: Corey Guerrero, Corey Guerrero Date: 04/09/2023 Gender: Male D.O.B: 06-Dec-1955 Age (years): 76 Referring Provider: Claiborne Rigg NP Height (inches): 66 Interpreting Physician: Jetty Duhamel MD, ABSM Weight (lbs): 184 RPSGT: Lowry Ram BMI: 30 MRN: [id number] Neck Size: 16.00  CLINICAL INFORMATION Sleep Study Type: Split Night CPAP Indication for sleep study: Hypertension, Snoring, Uncontrolled Hypertension Epworth Sleepiness Score: 6  SLEEP STUDY TECHNIQUE As per the AASM Manual for the Scoring of Sleep and Associated Events v2.3 (April 2016) with a hypopnea requiring 4% desaturations.  The channels recorded and monitored were frontal, central and occipital EEG, electrooculogram (EOG), submentalis EMG (chin), nasal and oral airflow, thoracic and abdominal wall motion, anterior tibialis EMG, snore microphone, electrocardiogram, and pulse oximetry. Continuous positive airway pressure (CPAP) was initiated when the patient met split night criteria and was titrated according to treat sleep-disordered breathing.  MEDICATIONS Medications self-administered by patient taken the night of the study : none reported  RESPIRATORY PARAMETERS Diagnostic  Total AHI (/hr): 15.3 RDI (/hr): 16.1 OA Index (/hr): - CA Index (/hr): 0.0 REM AHI (/hr): 33.3 NREM AHI (/hr): 9.5 Supine AHI (/hr): 15.3 Non-supine AHI (/hr): 0 Min O2 Sat (%): 76.0 Mean O2 (%): 91.1 Time below 88% (min): 7.2   Titration  Optimal Pressure (cm):  AHI at Optimal Pressure (/hr): N/A Min O2 at Optimal Pressure (%): 82.0 Supine % at Optimal (%): N/A Sleep % at Optimal (%): N/A   SLEEP ARCHITECTURE The recording time for the entire night was 395.2 minutes.  During a baseline period of 159.3 minutes, the patient slept for 149.3 minutes in REM and nonREM, yielding a sleep efficiency of 93.7%. Sleep onset after lights out was 5.1 minutes with a REM latency of 9.0 minutes. The patient spent 3.0% of the night in  stage N1 sleep, 72.9% in stage N2 sleep, 0.0% in stage N3 and 24.1% in REM.  During the titration period of 227.9 minutes, the patient slept for 130.5 minutes in REM and nonREM, yielding a sleep efficiency of 57.3%. Sleep onset after CPAP initiation was 2.8 minutes with a REM latency of 43.0 minutes. The patient spent 4.6% of the night in stage N1 sleep, 84.7% in stage N2 sleep, 0.0% in stage N3 and 10.7% in REM.  CARDIAC DATA The 2 lead EKG demonstrated sinus rhythm. The mean heart rate was 100.0 beats per minute. Other EKG findings include: PVCs.  LEG MOVEMENT DATA The total Periodic Limb Movements of Sleep (PLMS) were 0. The PLMS index was 0.0 .  IMPRESSIONS - Moderate obstructive sleep apnea occurred during the diagnostic portion of the study(AHI = 15.3/hour). - Optimal pressure 11 cwp. - No significant central sleep apnea occurred during the diagnostic portion of the study (CAI = 0.0/hour). - Moderate oxygen desaturation was noted during the diagnostic portion of the study (Min O2 =76.0%). Minimum O2 saturation on CPAP 10 was 92%. - The patient snored with soft snoring volume during the diagnostic portion of the study. - EKG findings include PVCs. - Clinically significant periodic limb movements did not occur during sleep.  DIAGNOSIS - Obstructive Sleep Apnea (G47.33)  RECOMMENDATIONS - Suggest CPAP 11 or autopap 5-15. - Patient used a small-wide ResMed AirFit N30i mask with heated humidity. - Be careful with alcohol, sedatives and other CNS depressants that may worsen sleep apnea and disrupt normal sleep architecture. - Note patient's usual wake time is 03:00 AM. - Sleep hygiene should be reviewed to assess factors that may improve sleep quality. - Weight management and regular  exercise should be initiated or continued.  [Electronically signed] 04/13/2023 11:28 AM  Jetty Duhamel MD, ABSM Diplomate, American Board of Sleep Medicine NPI: [id number]                           Jetty Duhamel Diplomate, American Board of Sleep Medicine  ELECTRONICALLY SIGNED ON:  04/13/2023, 11:22 AM [facility] PH: [phone]   FX: [phone] ACCREDITED BY THE AMERICAN ACADEMY OF SLEEP MEDICINE

## 2023-04-23 ENCOUNTER — Other Ambulatory Visit: Payer: Self-pay | Admitting: Nurse Practitioner

## 2023-04-23 DIAGNOSIS — R519 Headache, unspecified: Secondary | ICD-10-CM

## 2023-04-29 ENCOUNTER — Other Ambulatory Visit (HOSPITAL_COMMUNITY): Payer: Self-pay

## 2023-04-29 ENCOUNTER — Ambulatory Visit (INDEPENDENT_AMBULATORY_CARE_PROVIDER_SITE_OTHER): Payer: 59 | Admitting: Family

## 2023-04-29 ENCOUNTER — Telehealth: Payer: Self-pay

## 2023-04-29 ENCOUNTER — Encounter: Payer: Self-pay | Admitting: Family

## 2023-04-29 ENCOUNTER — Other Ambulatory Visit: Payer: Self-pay

## 2023-04-29 VITALS — BP 147/95 | HR 55 | Temp 98.3°F | Ht 66.0 in | Wt 185.0 lb

## 2023-04-29 DIAGNOSIS — B182 Chronic viral hepatitis C: Secondary | ICD-10-CM | POA: Diagnosis not present

## 2023-04-29 DIAGNOSIS — N1832 Chronic kidney disease, stage 3b: Secondary | ICD-10-CM | POA: Diagnosis not present

## 2023-04-29 NOTE — Telephone Encounter (Addendum)
[facility] Patient Advocate Encounter  Insurance verification completed.    The patient is insured through  [employer] . Patient has Medicare and is not eligible for a copay card, but may be able to apply for patient assistance, if available.    Ran test claim for MAVYRET & [place]CL[place]   Medication will need a PA.  We will continue to follow to see if copay assistance is needed.  This test claim was processed through [facility]- copay amounts may vary at other pharmacies due to pharmacy/plan contracts, or as the patient moves through the different stages of their insurance plan.

## 2023-04-29 NOTE — Assessment & Plan Note (Signed)
Corey Guerrero has chronic kidney disease Stage 3b. No adjustment of Hepatitis C treatment medication is needed.

## 2023-04-29 NOTE — Patient Instructions (Addendum)
Nice to see you.  Continue to take your medication daily as prescribed.  Refills have been sent to the pharmacy.  Plan for follow up in 1 month after start of medication or sooner if needed.   Have a great day and stay safe!
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
## 2023-04-29 NOTE — Assessment & Plan Note (Addendum)
Corey Guerrero is a 67 y/o AA male with chronic Hepatitis C with initial viral load of 10,500 and risk factor of age and drug use. Treatment naive and asymptomatic. denies abdominal pain, nausea, vomiting, fatigue, fever, scleral icterus or jaundice. Check lab work. Will need to adjust medications (atorvastatin if Mavyret or omeprazole if Epclusa)pending treatment option chosen. Plan for follow up in 1 month after start of medication with treatment of Mavyret or Epclusa.

## 2023-04-30 ENCOUNTER — Ambulatory Visit: Payer: 59 | Attending: Nurse Practitioner | Admitting: Nurse Practitioner

## 2023-04-30 VITALS — BP 124/78 | HR 50 | Ht 66.0 in | Wt 187.0 lb

## 2023-04-30 DIAGNOSIS — Z0100 Encounter for examination of eyes and vision without abnormal findings: Secondary | ICD-10-CM | POA: Diagnosis not present

## 2023-04-30 DIAGNOSIS — G4733 Obstructive sleep apnea (adult) (pediatric): Secondary | ICD-10-CM | POA: Diagnosis not present

## 2023-04-30 DIAGNOSIS — R7303 Prediabetes: Secondary | ICD-10-CM

## 2023-04-30 DIAGNOSIS — I10 Essential (primary) hypertension: Secondary | ICD-10-CM | POA: Diagnosis not present

## 2023-04-30 LAB — POCT GLYCOSYLATED HEMOGLOBIN (HGB A1C): HbA1c, POC (prediabetic range): 5.8 % (ref 5.7–6.4)
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
## 2023-05-01 LAB — CMP14+EGFR
ALT: 34 [IU]/L (ref 0–44)
AST: 39 [IU]/L (ref 0–40)
Albumin: 4.1 g/dL (ref 3.9–4.9)
Alkaline Phosphatase: 84 [IU]/L (ref 44–121)
BUN/Creatinine Ratio: 14 (ref 10–24)
BUN: 22 mg/dL (ref 8–27)
Bilirubin Total: 0.6 mg/dL (ref 0.0–1.2)
CO2: 21 mmol/L (ref 20–29)
Calcium: 9.2 mg/dL (ref 8.6–10.2)
Chloride: 105 mmol/L (ref 96–106)
Creatinine, Ser: 1.54 mg/dL — ABNORMAL HIGH (ref 0.76–1.27)
Globulin, Total: 2.9 g/dL (ref 1.5–4.5)
Glucose: 108 mg/dL — ABNORMAL HIGH (ref 70–99)
Potassium: 4 mmol/L (ref 3.5–5.2)
Sodium: 141 mmol/L (ref 134–144)
Total Protein: 7 g/dL (ref 6.0–8.5)
eGFR: 49 mL/min/{1.73_m2} — ABNORMAL LOW (ref >59)

## 2023-05-02 ENCOUNTER — Ambulatory Visit
Admission: RE | Admit: 2023-05-02 | Discharge: 2023-05-02 | Disposition: A | Discharge status: Home / Self Care | Payer: 59 | Source: Ambulatory Visit | Attending: Nurse Practitioner

## 2023-05-02 DIAGNOSIS — R519 Headache, unspecified: Secondary | ICD-10-CM

## 2023-05-06 LAB — CBC
HCT: 46.1 % (ref 38.5–50.0)
Hemoglobin: 15.4 g/dL (ref 13.2–17.1)
MCH: 31 pg (ref 27.0–33.0)
MCHC: 33.4 g/dL (ref 32.0–36.0)
MCV: 92.9 fL (ref 80.0–100.0)
MPV: 13.5 fL — ABNORMAL HIGH (ref 7.5–12.5)
Platelets: 200 10*3/uL (ref 140–400)
RBC: 4.96 10*6/uL (ref 4.20–5.80)
RDW: 12.3 % (ref 11.0–15.0)
WBC: 5.1 10*3/uL (ref 3.8–10.8)

## 2023-05-06 LAB — PROTIME-INR
INR: 1
Prothrombin Time: 11.2 s (ref 9.0–11.5)

## 2023-05-06 LAB — HEPATITIS C GENOTYPE

## 2023-05-06 LAB — HEPATIC FUNCTION PANEL
AG Ratio: 1.3 (calc) (ref 1.0–2.5)
ALT: 34 U/L (ref 9–46)
AST: 35 U/L (ref 10–35)
Albumin: 4.1 g/dL (ref 3.6–5.1)
Alkaline phosphatase (APISO): 72 U/L (ref 35–144)
Bilirubin, Direct: 0.3 mg/dL — ABNORMAL HIGH (ref 0.0–0.2)
Globulin: 3.1 g/dL (ref 1.9–3.7)
Indirect Bilirubin: 0.6 mg/dL (ref 0.2–1.2)
Total Bilirubin: 0.9 mg/dL (ref 0.2–1.2)
Total Protein: 7.2 g/dL (ref 6.1–8.1)

## 2023-05-06 LAB — LIVER FIBROSIS, FIBROTEST-ACTITEST
ALT: 32 U/L (ref 9–46)
Alpha-2-Macroglobulin: 293 mg/dL — ABNORMAL HIGH (ref 106–279)
Apolipoprotein A1: 141 mg/dL (ref 94–176)
Bilirubin: 0.8 mg/dL (ref 0.2–1.2)
Fibrosis Score: 0.65
GGT: 45 U/L (ref 3–70)
Haptoglobin: 115 mg/dL (ref 43–212)
Necroinflammat ACT Score: 0.25
Reference ID: 5185982

## 2023-05-06 LAB — HIV ANTIBODY (ROUTINE TESTING W REFLEX): HIV 1&2 Ab, 4th Generation: NONREACTIVE

## 2023-05-06 LAB — HEPATITIS B SURFACE ANTIGEN: Hepatitis B Surface Ag: NONREACTIVE

## 2023-05-06 LAB — HEPATITIS B SURFACE ANTIBODY,QUALITATIVE: Hep B S Ab: NONREACTIVE

## 2023-05-06 LAB — HEPATITIS B CORE ANTIBODY, TOTAL: Hep B Core Total Ab: NONREACTIVE

## 2023-05-08 ENCOUNTER — Other Ambulatory Visit: Payer: Self-pay | Admitting: Family

## 2023-05-08 ENCOUNTER — Other Ambulatory Visit (HOSPITAL_COMMUNITY): Payer: Self-pay

## 2023-05-08 DIAGNOSIS — B182 Chronic viral hepatitis C: Secondary | ICD-10-CM

## 2023-05-14 ENCOUNTER — Ambulatory Visit (HOSPITAL_COMMUNITY)
Admission: RE | Admit: 2023-05-14 | Discharge: 2023-05-14 | Disposition: A | Discharge status: Home / Self Care | Payer: 59 | Source: Ambulatory Visit | Attending: Family | Admitting: Family

## 2023-05-14 DIAGNOSIS — B182 Chronic viral hepatitis C: Secondary | ICD-10-CM | POA: Diagnosis present

## 2023-05-22 ENCOUNTER — Other Ambulatory Visit: Payer: Self-pay

## 2023-05-26 ENCOUNTER — Other Ambulatory Visit: Payer: Self-pay

## 2023-05-28 ENCOUNTER — Other Ambulatory Visit: Payer: Self-pay | Admitting: Family

## 2023-05-30 ENCOUNTER — Telehealth: Payer: Self-pay

## 2023-05-30 ENCOUNTER — Other Ambulatory Visit (HOSPITAL_COMMUNITY): Payer: Self-pay

## 2023-05-30 NOTE — Telephone Encounter (Signed)
[facility] Patient Advocate Encounter   Received notification from [employer] that prior authorization for Mavyret is required.   PA submitted on 05/30/23 Key [id number] Status is pending    [facility] Clinic will continue to follow.   Clearance Coots, CPhT Specialty Pharmacy Patient [facility] for Infectious Disease Phone: [phone] Fax:  [phone]

## 2023-06-02 ENCOUNTER — Other Ambulatory Visit (HOSPITAL_COMMUNITY): Payer: Self-pay

## 2023-06-02 ENCOUNTER — Telehealth: Payer: Self-pay

## 2023-06-02 DIAGNOSIS — E785 Hyperlipidemia, unspecified: Secondary | ICD-10-CM

## 2023-06-02 DIAGNOSIS — B182 Chronic viral hepatitis C: Secondary | ICD-10-CM

## 2023-06-02 NOTE — Telephone Encounter (Signed)
[facility] Patient Advocate Encounter  Prior Authorization for Mavyret has been approved.    PA# [id number] Effective dates: 06/01/23/ through 05/24/24  Patients co-pay is $0.00.   Prescription can be filled at [facility].  [facility] will continue to follow.  Clearance Coots, CPhT Specialty Pharmacy Patient [facility] for Infectious Disease Phone: [phone] Fax:  [phone]

## 2023-06-03 ENCOUNTER — Other Ambulatory Visit: Payer: Self-pay

## 2023-06-03 ENCOUNTER — Other Ambulatory Visit: Payer: Self-pay | Admitting: Pharmacist

## 2023-06-03 ENCOUNTER — Other Ambulatory Visit (HOSPITAL_COMMUNITY): Payer: Self-pay

## 2023-06-03 MED ORDER — MAVYRET 100-40 MG PO TABS
3.0000 | ORAL_TABLET | Freq: Every day | ORAL | 1 refills | Status: DC
Start: 2023-06-03 — End: 2023-11-13
  Filled 2023-06-03: qty 84, 28d supply, fill #0
  Filled 2023-06-23: qty 84, 28d supply, fill #1

## 2023-06-03 MED ORDER — ROSUVASTATIN CALCIUM 10 MG PO TABS
10.0000 mg | ORAL_TABLET | Freq: Every day | ORAL | 1 refills | Status: DC
Start: 2023-06-03 — End: 2023-08-13
  Filled 2023-06-03: qty 30, 30d supply, fill #0
  Filled 2023-07-05: qty 30, 30d supply, fill #1

## 2023-06-03 NOTE — Telephone Encounter (Signed)
Will send in Mavyret prescription today. Tammy Sours, please advise about atorvastatin contraindication with Mavyret. Looks like it is for primary prevention - would you want to hold atorvastatin while on treatment or switch to another statin?

## 2023-06-03 NOTE — Addendum Note (Signed)
Addended by: Jennette Kettle on: 06/03/2023 10:16 AM   Modules accepted: Orders

## 2023-06-03 NOTE — Addendum Note (Signed)
Addended by: Jennette Kettle on: 06/03/2023 09:15 AM   Modules accepted: Orders

## 2023-06-03 NOTE — Progress Notes (Signed)
Specialty Pharmacy Initiation Note   Corey Guerrero is a 67 y.o. male who will be followed by the specialty pharmacy service for RxSp Hepatitis C    Review of administration, indication, effectiveness, safety, potential side effects, storage/disposable, and missed dose instructions occurred today for patient's specialty medication(s) Glecaprevir-Pibrentasvir     Patient/Caregiver did not have any additional questions or concerns.   Patient's therapy is appropriate to: Initiate    Goals Addressed             This Visit's Progress    Achieve virologic cure as evidenced by SVR       Patient is initiating therapy. Patient will be evaluated at upcoming provider appointment to assess progress      Maintain optimal adherence to therapy       Patient is initiating therapy. Patient will be evaluated at upcoming provider appointment to assess progress         Jennette Kettle Specialty Pharmacist

## 2023-06-03 NOTE — Progress Notes (Signed)
Specialty Pharmacy Initial Fill Coordination Note  Corey Guerrero is a 67 y.o. male contacted today regarding initial fill of specialty medication(s) Glecaprevir-Pibrentasvir   Patient requested Delivery   Delivery date: 06/05/23   Verified address: [address] [place] [address]   Medication will be filled on 06/04/23.   Patient is aware of 0.00 copayment.

## 2023-06-03 NOTE — Progress Notes (Signed)
Patient is approved to receive Mavyret x 8 weeks for chronic Hepatitis C infection. Counseled patient to take all three tablets of Mavyret daily with food.  Counseled patient the need to take all three tablets together and to not separate them out during the day. Encouraged patient not to miss any doses and explained how their chance of cure could go down with each dose missed. Counseled patient on what to do if dose is missed - if it is closer to the missed dose take immediately; if closer to next dose then skip dose and take the next dose at the usual time. Counseled patient on common side effects such as headache, fatigue, and nausea and that these normally decrease with time. I reviewed patient medications and found one drug interaction with atorvastatin. Will hold atorvastatin and start Crestor 10 mg per Tammy Sours while on Mavyret. Discussed with patient that there are several drug interactions with Mavyret and instructed patient to call the clinic if he wishes to start a new medication during course of therapy. Also advised patient to call if he experiences any side effects. Patient will follow-up with me in the pharmacy clinic on 1/9.  Margarite Gouge, PharmD, CPP, BCIDP, AAHIVP Clinical Pharmacist Practitioner Infectious Diseases Clinical Pharmacist [facility] for Infectious Disease

## 2023-06-04 ENCOUNTER — Other Ambulatory Visit: Payer: Self-pay

## 2023-06-09 ENCOUNTER — Other Ambulatory Visit: Payer: Self-pay

## 2023-06-18 ENCOUNTER — Other Ambulatory Visit: Payer: Self-pay

## 2023-06-23 ENCOUNTER — Other Ambulatory Visit (HOSPITAL_COMMUNITY): Payer: Self-pay | Admitting: Pharmacy Technician

## 2023-06-23 ENCOUNTER — Other Ambulatory Visit (HOSPITAL_COMMUNITY): Payer: Self-pay

## 2023-06-23 NOTE — Progress Notes (Signed)
Specialty Pharmacy Refill Coordination Note  Corey Guerrero is a 67 y.o. male contacted today regarding refills of specialty medication(s) Glecaprevir-Pibrentasvir [facility]bal[facility])   Patient requested Delivery   Delivery date: 06/30/23   Verified address: [address]  [place]   Medication will be filled on 06/27/23.

## 2023-06-24 ENCOUNTER — Other Ambulatory Visit: Payer: Self-pay

## 2023-06-27 ENCOUNTER — Other Ambulatory Visit: Payer: Self-pay

## 2023-07-01 ENCOUNTER — Encounter: Payer: Self-pay | Admitting: Nurse Practitioner

## 2023-07-05 ENCOUNTER — Other Ambulatory Visit: Payer: Self-pay | Admitting: Nurse Practitioner

## 2023-07-05 ENCOUNTER — Other Ambulatory Visit: Payer: Self-pay

## 2023-07-05 DIAGNOSIS — M1000 Idiopathic gout, unspecified site: Secondary | ICD-10-CM

## 2023-07-05 MED ORDER — PREDNISONE 20 MG PO TABS
20.0000 mg | ORAL_TABLET | Freq: Every day | ORAL | 0 refills | Status: AC
Start: 2023-07-05 — End: 2023-07-14
  Filled 2023-07-05: qty 7, 7d supply, fill #0

## 2023-07-05 NOTE — Telephone Encounter (Signed)
 Prednisone sent

## 2023-07-06 ENCOUNTER — Other Ambulatory Visit: Payer: Self-pay

## 2023-07-07 ENCOUNTER — Other Ambulatory Visit: Payer: Self-pay

## 2023-07-09 ENCOUNTER — Other Ambulatory Visit (HOSPITAL_COMMUNITY): Payer: Self-pay

## 2023-07-10 ENCOUNTER — Ambulatory Visit (INDEPENDENT_AMBULATORY_CARE_PROVIDER_SITE_OTHER): Payer: 59 | Admitting: Pharmacist

## 2023-07-10 ENCOUNTER — Other Ambulatory Visit: Payer: Self-pay

## 2023-07-10 DIAGNOSIS — B182 Chronic viral hepatitis C: Secondary | ICD-10-CM | POA: Diagnosis not present
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
## 2023-07-12 LAB — HEPATITIS C RNA QUANTITATIVE
HCV Quantitative Log: <1.18 {Log}
HCV RNA, PCR, QN: <15 [IU]/mL

## 2023-07-22 ENCOUNTER — Other Ambulatory Visit: Payer: Self-pay

## 2023-07-23 ENCOUNTER — Other Ambulatory Visit: Payer: Self-pay

## 2023-07-28 ENCOUNTER — Other Ambulatory Visit: Payer: Self-pay

## 2023-07-29 ENCOUNTER — Other Ambulatory Visit: Payer: Self-pay

## 2023-07-30 ENCOUNTER — Other Ambulatory Visit: Payer: Self-pay

## 2023-08-08 LAB — LAB REPORT - SCANNED
Albumin, Urine POC: 746.7
Creatinine, POC: 120.9 mg/dL
EGFR: 40
Microalb Creat Ratio: 618

## 2023-08-11 ENCOUNTER — Other Ambulatory Visit: Payer: Self-pay

## 2023-08-11 MED ORDER — ALLOPURINOL 100 MG PO TABS
100.0000 mg | ORAL_TABLET | Freq: Every day | ORAL | 6 refills | Status: DC
  Filled 2023-08-11: qty 30, 30d supply, fill #0
  Filled 2023-09-08: qty 30, 30d supply, fill #1
  Filled 2023-09-23 – 2023-10-08 (×2): qty 30, 30d supply, fill #2
  Filled 2023-11-05: qty 30, 30d supply, fill #3
  Filled 2023-12-01: qty 30, 30d supply, fill #4
  Filled 2024-01-03: qty 30, 30d supply, fill #5
  Filled 2024-01-24 – 2024-01-26 (×2): qty 30, 30d supply, fill #6

## 2023-08-12 ENCOUNTER — Other Ambulatory Visit: Payer: Self-pay

## 2023-08-13 ENCOUNTER — Other Ambulatory Visit: Payer: Self-pay

## 2023-08-13 ENCOUNTER — Encounter: Payer: Self-pay | Admitting: Nephrology

## 2023-08-13 ENCOUNTER — Ambulatory Visit (INDEPENDENT_AMBULATORY_CARE_PROVIDER_SITE_OTHER): Payer: 59 | Admitting: Pharmacist

## 2023-08-13 DIAGNOSIS — B182 Chronic viral hepatitis C: Secondary | ICD-10-CM

## 2023-08-13 NOTE — Progress Notes (Signed)
HPI: Corey Guerrero is a 68 y.o. male who presents to the [facility] pharmacy clinic for Hepatitis C follow-up.  Medication: Mavyret x 8 weeks  Start Date: 06/03/2023  Hepatitis C Genotype: 1a  Fibrosis Score: F3  Hepatitis C RNA: 1.54 million historically > undetectable (07/10/23)  Patient Active Problem List   Diagnosis Date Noted   Chronic hepatitis C without hepatic coma (HCC) 04/29/2023   Aortic atherosclerosis (HCC) 01/15/2022   Mixed hyperlipidemia 01/15/2022   Pain in right foot 08/22/2021   Obstructive sleep apnea 08/06/2021   Accelerated hypertension 03/23/2020   Chronic kidney disease (CKD), stage IV (severe) (HCC) 08/18/2018   Noncompliance with medications    Poorly-controlled hypertension    CKD (chronic kidney disease) stage 3, GFR 30-59 ml/min (HCC)    Hypertensive emergency 02/25/2018   Hypertensive urgency, malignant 02/24/2018   Strain of calf muscle, initial encounter 06/14/2016   Intractable tension-type headache 04/01/2016   Acute allergic rhinitis 04/01/2016   Bradycardia 04/28/2015   Muscle right arm weakness    Olecranon bursitis of right elbow 01/19/2015   Erectile dysfunction 01/19/2015   CKD (chronic kidney disease) stage 2, GFR 60-89 ml/min 10/15/2011   Essential hypertension 05/20/2011   Hypokalemia 05/20/2011   Abnormal EKG 05/20/2011    Patient's Medications  New Prescriptions   No medications on file  Previous Medications   ACETAMINOPHEN (TYLENOL) 500 MG TABLET    Take 1 tablet (500 mg total) by mouth every 6 (six) hours as needed.   ALLOPURINOL (ZYLOPRIM) 100 MG TABLET    Take 1 tablet (100 mg total) by mouth daily.   AMLODIPINE (NORVASC) 10 MG TABLET    Take 1 tablet (10 mg total) by mouth in the morning.   ASPIRIN 81 MG CHEWABLE TABLET    Chew 1 tablet (81 mg total) by mouth daily.   ATORVASTATIN (LIPITOR) 40 MG TABLET    Take 1 tablet (40 mg total) by mouth daily.   ATORVASTATIN (LIPITOR) 40 MG TABLET    Take 1 tablet (40 mg total) by  mouth at bedtime.   CARVEDILOL (COREG) 12.5 MG TABLET    Take 1 tablet (12.5 mg total) by mouth 2 (two) times daily with a meal.   CARVEDILOL (COREG) 12.5 MG TABLET    Take 1 tablet (12.5 mg total) by mouth 2 (two) times daily with food.   CLONIDINE (CATAPRES) 0.1 MG TABLET    Take 1 tablet (0.1 mg total) by mouth 2 (two) times daily.   CLONIDINE (CATAPRES) 0.1 MG TABLET    Take 1 tablet (0.1 mg total) by mouth 2 (two) times daily.   EMPAGLIFLOZIN (JARDIANCE) 10 MG TABS TABLET    Take 1 tablet by mouth daily in the morning for kidney and heart health   GLECAPREVIR-PIBRENTASVIR (MAVYRET) 100-40 MG TABS    Take 3 tablets by mouth daily with breakfast.   LISINOPRIL (ZESTRIL) 10 MG TABLET    Take 1 tablet (10 mg total) by mouth daily.   MULTIVITAMIN-IRON-MINERALS-FOLIC ACID (CENTRUM) CHEWABLE TABLET    Chew 1 tablet by mouth daily.   OMEPRAZOLE (PRILOSEC) 20 MG CAPSULE    Take 1 capsule (20 mg total) by mouth daily.   SILDENAFIL (VIAGRA) 100 MG TABLET    Take 0.5-1 tablets (50-100 mg total) by mouth daily as needed for erectile dysfunction.  Modified Medications   No medications on file  Discontinued Medications   ROSUVASTATIN (CRESTOR) 10 MG TABLET    Take 1 tablet (10 mg total) by mouth  daily. Stop atorvastatin and take Crestor while taking Mavyret    Labs: Hepatitis C Lab Results  Component Value Date   HCVGENOTYPE 1a 04/29/2023   HCVRNAPCRQN <15 07/10/2023   FIBROSTAGE F3 04/29/2023   Hepatitis B Lab Results  Component Value Date   HEPBSAB NON-REACTIVE 04/29/2023   HEPBSAG NON-REACTIVE 04/29/2023   HEPBCAB NON-REACTIVE 04/29/2023   Hepatitis A No results found for: "HAV" HIV Lab Results  Component Value Date   HIV NON-REACTIVE 04/29/2023   HIV Non Reactive 02/24/2018   HIV NONREACTIVE 06/15/2015   Lab Results  Component Value Date   CREATININE 1.54 (H) 04/30/2023   CREATININE 1.76 (H) 01/27/2023   CREATININE 1.63 (H) 01/20/2023   CREATININE 1.60 (H) 12/04/2022    CREATININE 1.76 (H) 10/18/2022   Lab Results  Component Value Date   AST 39 04/30/2023   AST 35 04/29/2023   AST 84 (H) 01/27/2023   ALT 34 04/30/2023   ALT 34 04/29/2023   ALT 32 04/29/2023   INR 1.0 04/29/2023   INR 1.06 03/22/2014   INR 0.92 11/12/2011    Assessment: [name] presents to clinic today for HCV follow-up. He has completed his 8 week course of Mavyret and has not missed any doses. No concerns with side effects. We discussed with patient that he may resume atorvastatin and discontinue rosuvastatin now that he is off Mavyret. We recommended vaccinations today including COVID, PCV20, and Hep B. He declined vaccines today. We will obtain a Hep A antibody test. We will also check HCV RNA today and follow-up in 12 weeks.   Plan: - Check HCV RNA and Hep A Ab - Discontinue Rosuvastatin - Resume Atorvastatin - Follow up on 11/13/23 for repeat HCV RNA   Buddy Duty, PharmD Student [facility] 08/13/2023, 3:34 PM

## 2023-08-16 LAB — HEPATITIS C RNA QUANTITATIVE
HCV Quantitative Log: <1.18 {Log}
HCV RNA, PCR, QN: <15 [IU]/mL

## 2023-08-16 LAB — HEPATITIS A ANTIBODY, TOTAL: Hepatitis A AB,Total: REACTIVE — AB

## 2023-09-08 ENCOUNTER — Other Ambulatory Visit: Payer: Self-pay | Admitting: Family Medicine

## 2023-09-08 ENCOUNTER — Other Ambulatory Visit: Payer: Self-pay

## 2023-09-08 DIAGNOSIS — K219 Gastro-esophageal reflux disease without esophagitis: Secondary | ICD-10-CM

## 2023-09-08 MED ORDER — OMEPRAZOLE 20 MG PO CPDR
20.0000 mg | DELAYED_RELEASE_CAPSULE | Freq: Every day | ORAL | 0 refills | Status: DC
  Filled 2023-09-08: qty 90, 90d supply, fill #0

## 2023-09-09 ENCOUNTER — Other Ambulatory Visit: Payer: Self-pay

## 2023-09-10 ENCOUNTER — Other Ambulatory Visit: Payer: Self-pay

## 2023-09-23 ENCOUNTER — Other Ambulatory Visit: Payer: Self-pay

## 2023-09-23 ENCOUNTER — Other Ambulatory Visit: Payer: Self-pay | Admitting: Nurse Practitioner

## 2023-09-23 DIAGNOSIS — K219 Gastro-esophageal reflux disease without esophagitis: Secondary | ICD-10-CM

## 2023-09-24 ENCOUNTER — Ambulatory Visit: Attending: Family Medicine | Admitting: Nurse Practitioner

## 2023-09-24 ENCOUNTER — Other Ambulatory Visit: Payer: Self-pay

## 2023-09-24 VITALS — BP 135/83 | HR 52 | Resp 19 | Ht 66.0 in | Wt 186.5 lb

## 2023-09-24 DIAGNOSIS — R7303 Prediabetes: Secondary | ICD-10-CM | POA: Diagnosis not present

## 2023-09-24 DIAGNOSIS — M10071 Idiopathic gout, right ankle and foot: Secondary | ICD-10-CM

## 2023-09-24 DIAGNOSIS — R1032 Left lower quadrant pain: Secondary | ICD-10-CM | POA: Diagnosis not present
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
## 2023-09-25 LAB — HEMOGLOBIN A1C
Est. average glucose Bld gHb Est-mCnc: 128 mg/dL
Hgb A1c MFr Bld: 6.1 % — ABNORMAL HIGH (ref 4.8–5.6)

## 2023-09-25 LAB — URIC ACID: Uric Acid: 5.4 mg/dL (ref 3.8–8.4)

## 2023-09-26 ENCOUNTER — Encounter: Payer: Self-pay | Admitting: Nurse Practitioner

## 2023-10-02 ENCOUNTER — Other Ambulatory Visit: Payer: Self-pay

## 2023-10-08 ENCOUNTER — Other Ambulatory Visit: Payer: Self-pay | Admitting: Nurse Practitioner

## 2023-10-08 ENCOUNTER — Other Ambulatory Visit: Payer: Self-pay

## 2023-10-08 DIAGNOSIS — R350 Frequency of micturition: Secondary | ICD-10-CM

## 2023-10-08 MED ORDER — ASPIRIN 81 MG PO CHEW
81.0000 mg | CHEWABLE_TABLET | Freq: Every day | ORAL | 1 refills | Status: DC
  Filled 2023-10-08: qty 90, 90d supply, fill #0
  Filled 2024-01-03: qty 90, 90d supply, fill #1

## 2023-10-10 ENCOUNTER — Other Ambulatory Visit: Payer: Self-pay

## 2023-11-05 ENCOUNTER — Other Ambulatory Visit: Payer: Self-pay

## 2023-11-05 ENCOUNTER — Other Ambulatory Visit: Payer: Self-pay | Admitting: Nurse Practitioner

## 2023-11-05 DIAGNOSIS — I10 Essential (primary) hypertension: Secondary | ICD-10-CM

## 2023-11-05 MED ORDER — CLONIDINE HCL 0.1 MG PO TABS
0.1000 mg | ORAL_TABLET | Freq: Two times a day (BID) | ORAL | 1 refills | Status: DC
  Filled 2023-11-05: qty 180, 90d supply, fill #0
  Filled 2024-02-06: qty 180, 90d supply, fill #1

## 2023-11-06 ENCOUNTER — Other Ambulatory Visit: Payer: Self-pay

## 2023-11-13 ENCOUNTER — Encounter: Payer: Self-pay | Admitting: Family

## 2023-11-13 ENCOUNTER — Ambulatory Visit (INDEPENDENT_AMBULATORY_CARE_PROVIDER_SITE_OTHER): Payer: 59 | Admitting: Family

## 2023-11-13 ENCOUNTER — Other Ambulatory Visit: Payer: Self-pay

## 2023-11-13 VITALS — BP 115/77 | HR 48 | Temp 97.8°F | Ht 66.0 in | Wt 185.0 lb

## 2023-11-13 DIAGNOSIS — B182 Chronic viral hepatitis C: Secondary | ICD-10-CM | POA: Diagnosis not present

## 2023-11-13 NOTE — Patient Instructions (Signed)
 Nice to see you.  We will check your lab work today.  Recommend routine liver cancer screening every 6 months with ultrasound with or without lab work (alpha fetoprotein).   If you need to be tested for Hepatitis C in the future a Hepatitis C RNA level or viral load will need to be checked as the Hepatitis C antibody used for initial screening will always be positive.   No follow up with ID needed if lab work is negative.   Have a great day and stay safe!

## 2023-11-13 NOTE — Assessment & Plan Note (Signed)
 Corey Guerrero has completed 8 weeks of Mavyret  for treatment of chronic hepatitis B with no adverse side effects.  Reviewed previous lab work and discussed plan of care to include checking hepatitis C RNA level for confirmation of sustained viremic response.  Fibrosis score F3 and recommend hepatocellular carcinoma screenings every 6 months with ultrasound with or without alpha-fetoprotein although would suspect his risk is on the lower side.  Counseled that if he needs to be tested for hepatitis C in the future we will need to use hepatitis C RNA level as hepatitis C antibody will always remain positive.  No additional treatment at this time.  Follow-up with ID as needed pending lab work.
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
## 2023-11-15 LAB — HEPATITIS C RNA QUANTITATIVE
HCV Quantitative Log: <1.18 {Log_IU}/mL
HCV RNA, PCR, QN: <15 [IU]/mL

## 2023-11-17 ENCOUNTER — Ambulatory Visit: Payer: Self-pay | Admitting: Family

## 2023-12-03 ENCOUNTER — Other Ambulatory Visit: Payer: Self-pay

## 2023-12-23 ENCOUNTER — Other Ambulatory Visit: Payer: Self-pay | Admitting: Nurse Practitioner

## 2023-12-23 ENCOUNTER — Other Ambulatory Visit: Payer: Self-pay

## 2023-12-23 ENCOUNTER — Telehealth: Payer: Self-pay | Admitting: Nurse Practitioner

## 2023-12-23 DIAGNOSIS — K219 Gastro-esophageal reflux disease without esophagitis: Secondary | ICD-10-CM

## 2023-12-23 DIAGNOSIS — I10 Essential (primary) hypertension: Secondary | ICD-10-CM

## 2023-12-23 MED ORDER — OMEPRAZOLE 20 MG PO CPDR
20.0000 mg | DELAYED_RELEASE_CAPSULE | Freq: Every day | ORAL | 0 refills | Status: DC
  Filled 2023-12-23: qty 90, 90d supply, fill #0

## 2023-12-23 MED ORDER — CARVEDILOL 12.5 MG PO TABS
12.5000 mg | ORAL_TABLET | Freq: Two times a day (BID) | ORAL | 1 refills | Status: DC
Start: 2023-12-23 — End: 2024-04-26
  Filled 2023-12-23: qty 180, 90d supply, fill #0
  Filled 2024-04-03: qty 180, 90d supply, fill #1

## 2023-12-23 NOTE — Telephone Encounter (Signed)
 Contacted pt left vm to confirmed appt sent out text message as well

## 2023-12-23 NOTE — Telephone Encounter (Signed)
 2nd attempt contacted pt to confirmed appt

## 2023-12-24 ENCOUNTER — Encounter: Payer: Self-pay | Admitting: Nurse Practitioner

## 2023-12-24 ENCOUNTER — Ambulatory Visit: Attending: Nurse Practitioner | Admitting: Nurse Practitioner

## 2023-12-24 ENCOUNTER — Other Ambulatory Visit: Payer: Self-pay

## 2023-12-24 VITALS — BP 146/94 | HR 53 | Resp 19 | Ht 66.0 in | Wt 184.6 lb

## 2023-12-24 DIAGNOSIS — R7303 Prediabetes: Secondary | ICD-10-CM | POA: Diagnosis not present

## 2023-12-24 DIAGNOSIS — I10 Essential (primary) hypertension: Secondary | ICD-10-CM | POA: Diagnosis not present

## 2023-12-24 DIAGNOSIS — M79672 Pain in left foot: Secondary | ICD-10-CM

## 2023-12-24 MED ORDER — EMPAGLIFLOZIN 10 MG PO TABS
10.0000 mg | ORAL_TABLET | Freq: Every morning | ORAL | 3 refills | Status: AC
  Filled 2023-12-24: qty 90, fill #0
  Filled 2024-02-23: qty 90, 90d supply, fill #0
  Filled 2024-05-23: qty 90, 90d supply, fill #1

## 2023-12-24 MED ORDER — AMLODIPINE BESYLATE 10 MG PO TABS
10.0000 mg | ORAL_TABLET | Freq: Every morning | ORAL | 3 refills | Status: AC
  Filled 2023-12-24 – 2024-03-19 (×2): qty 90, 90d supply, fill #0
  Filled 2024-06-12: qty 90, 90d supply, fill #1

## 2023-12-24 MED ORDER — LISINOPRIL 10 MG PO TABS
10.0000 mg | ORAL_TABLET | Freq: Every day | ORAL | 3 refills | Status: AC
  Filled 2023-12-24 – 2024-02-20 (×3): qty 90, 90d supply, fill #0
  Filled 2024-05-23: qty 90, 90d supply, fill #1
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
## 2023-12-25 LAB — HEMOGLOBIN A1C
Est. average glucose Bld gHb Est-mCnc: 126 mg/dL
Hgb A1c MFr Bld: 6 % — ABNORMAL HIGH (ref 4.8–5.6)

## 2023-12-25 LAB — CMP14+EGFR
ALT: 14 IU/L (ref 0–44)
AST: 26 IU/L (ref 0–40)
Albumin: 4.5 g/dL (ref 3.9–4.9)
Alkaline Phosphatase: 82 IU/L (ref 44–121)
BUN/Creatinine Ratio: 13 (ref 10–24)
BUN: 21 mg/dL (ref 8–27)
Bilirubin Total: 0.5 mg/dL (ref 0.0–1.2)
CO2: 22 mmol/L (ref 20–29)
Calcium: 9.6 mg/dL (ref 8.6–10.2)
Chloride: 103 mmol/L (ref 96–106)
Creatinine, Ser: 1.66 mg/dL — ABNORMAL HIGH (ref 0.76–1.27)
Globulin, Total: 2.7 g/dL (ref 1.5–4.5)
Glucose: 88 mg/dL (ref 70–99)
Potassium: 4.2 mmol/L (ref 3.5–5.2)
Sodium: 143 mmol/L (ref 134–144)
Total Protein: 7.2 g/dL (ref 6.0–8.5)
eGFR: 45 mL/min/{1.73_m2} — ABNORMAL LOW (ref >59)

## 2023-12-26 ENCOUNTER — Ambulatory Visit: Payer: Self-pay

## 2023-12-26 NOTE — Telephone Encounter (Signed)
 FYI Only or Action Required?: FYI only for provider.  Patient was last seen in primary care on 12/24/2023 by Guerrero Haze ORN, NP. Called Nurse Triage reporting No chief complaint on file.. Symptoms began today. Interventions attempted: Nothing. Symptoms are: stable.  Triage Disposition: No disposition on file.  Patient/caregiver understands and will follow disposition?:   Copied from CRM [id number]. Topic: Clinical - Medication Question >> Dec 26, 2023  3:29 PM Corey Guerrero wrote: Reason for CRM: medication question  Reason for Disposition  [1] DOUBLE DOSE (an extra dose or lesser amount) of prescription drug AND [2] NO symptoms  (Exception: A double dose of antibiotics.)  Answer Assessment - Initial Assessment Questions 1. NAME of MEDICINE: What medicine(s) are you calling about?     Lisinopril , Amlodipine   2. QUESTION: What is your question? (e.g., double dose of medicine, side effect)     Double Dose  3. PRESCRIBER: Who prescribed the medicine? Reason: if prescribed by specialist, call should be referred to that group.     Z. Theotis, NP  4. SYMPTOMS: Do you have any symptoms? If Yes, ask: What symptoms are you having?  How bad are the symptoms (e.g., mild, moderate, severe)     None  Protocols used: Medication Question Call-A-AH

## 2023-12-27 ENCOUNTER — Ambulatory Visit: Payer: Self-pay | Admitting: Nurse Practitioner

## 2023-12-29 NOTE — Telephone Encounter (Signed)
 Noted

## 2023-12-31 ENCOUNTER — Emergency Department (HOSPITAL_COMMUNITY)

## 2023-12-31 ENCOUNTER — Other Ambulatory Visit: Payer: Self-pay

## 2023-12-31 ENCOUNTER — Emergency Department (HOSPITAL_COMMUNITY)
Admission: EM | Admit: 2023-12-31 | Discharge: 2023-12-31 | Disposition: A | Discharge status: Home / Self Care | Attending: Emergency Medicine | Admitting: Emergency Medicine

## 2023-12-31 ENCOUNTER — Other Ambulatory Visit (HOSPITAL_COMMUNITY): Payer: Self-pay

## 2023-12-31 DIAGNOSIS — Z7982 Long term (current) use of aspirin: Secondary | ICD-10-CM | POA: Insufficient documentation

## 2023-12-31 DIAGNOSIS — R079 Chest pain, unspecified: Secondary | ICD-10-CM | POA: Diagnosis present

## 2023-12-31 DIAGNOSIS — Z79899 Other long term (current) drug therapy: Secondary | ICD-10-CM | POA: Insufficient documentation

## 2023-12-31 DIAGNOSIS — N184 Chronic kidney disease, stage 4 (severe): Secondary | ICD-10-CM | POA: Diagnosis not present

## 2023-12-31 DIAGNOSIS — I129 Hypertensive chronic kidney disease with stage 1 through stage 4 chronic kidney disease, or unspecified chronic kidney disease: Secondary | ICD-10-CM | POA: Diagnosis not present

## 2023-12-31 DIAGNOSIS — R001 Bradycardia, unspecified: Secondary | ICD-10-CM | POA: Insufficient documentation

## 2023-12-31 LAB — CBC
HCT: 47.7 % (ref 39.0–52.0)
Hemoglobin: 15.8 g/dL (ref 13.0–17.0)
MCH: 30.8 pg (ref 26.0–34.0)
MCHC: 33.1 g/dL (ref 30.0–36.0)
MCV: 93 fL (ref 80.0–100.0)
Platelets: 131 10*3/uL — ABNORMAL LOW (ref 150–400)
RBC: 5.13 MIL/uL (ref 4.22–5.81)
RDW: 12.6 % (ref 11.5–15.5)
WBC: 5.7 10*3/uL (ref 4.0–10.5)
nRBC: 0 % (ref 0.0–0.2)

## 2023-12-31 LAB — COMPREHENSIVE METABOLIC PANEL WITH GFR
ALT: 18 U/L (ref 0–44)
AST: 30 U/L (ref 15–41)
Albumin: 3.6 g/dL (ref 3.5–5.0)
Alkaline Phosphatase: 60 U/L (ref 38–126)
Anion gap: 13 (ref 5–15)
BUN: 19 mg/dL (ref 8–23)
CO2: 22 mmol/L (ref 22–32)
Calcium: 8.9 mg/dL (ref 8.9–10.3)
Chloride: 104 mmol/L (ref 98–111)
Creatinine, Ser: 1.72 mg/dL — ABNORMAL HIGH (ref 0.61–1.24)
GFR, Estimated: 43 mL/min — ABNORMAL LOW (ref >60)
Glucose, Bld: 118 mg/dL — ABNORMAL HIGH (ref 70–99)
Potassium: 3.9 mmol/L (ref 3.5–5.1)
Sodium: 139 mmol/L (ref 135–145)
Total Bilirubin: 1.1 mg/dL (ref 0.0–1.2)
Total Protein: 7 g/dL (ref 6.5–8.1)

## 2023-12-31 LAB — TROPONIN I (HIGH SENSITIVITY)
Troponin I (High Sensitivity): 23 ng/L — ABNORMAL HIGH (ref <18)
Troponin I (High Sensitivity): 21 ng/L — ABNORMAL HIGH (ref <18)

## 2023-12-31 MED ORDER — SODIUM CHLORIDE 0.9 % IV BOLUS
1000.0000 mL | Freq: Once | INTRAVENOUS | Status: AC
  Administered 2023-12-31: 1000 mL via INTRAVENOUS

## 2023-12-31 MED ORDER — LIDOCAINE 5 % EX PTCH
1.0000 | MEDICATED_PATCH | Freq: Once | CUTANEOUS | Status: DC
  Administered 2023-12-31: 1 via TRANSDERMAL
  Filled 2023-12-31: qty 1

## 2023-12-31 MED ORDER — CYCLOBENZAPRINE HCL 10 MG PO TABS
5.0000 mg | ORAL_TABLET | Freq: Once | ORAL | Status: AC
  Administered 2023-12-31: 5 mg via ORAL
  Filled 2023-12-31: qty 1

## 2023-12-31 MED ORDER — CYCLOBENZAPRINE HCL 5 MG PO TABS
5.0000 mg | ORAL_TABLET | Freq: Two times a day (BID) | ORAL | 0 refills | Status: DC | PRN
  Filled 2023-12-31 – 2024-01-06 (×2): qty 10, 5d supply, fill #0

## 2023-12-31 NOTE — ED Notes (Signed)
 Drink of water permitted verbally by Dr Elnor.

## 2023-12-31 NOTE — ED Notes (Signed)
 Patient verbalizes understanding of discharge instructions. Opportunity for questioning and answers were provided. Pt discharged from ED.

## 2023-12-31 NOTE — Discharge Instructions (Addendum)
 It was a pleasure caring for you today in the emergency department.  Return to the Emergency Department if you have unusual chest pain, pressure, or discomfort, shortness of breath, nausea, vomiting, burping, heartburn, tingling upper body parts, sweating, cold, clammy skin, or racing heartbeat. Call 911 if you think you are having a heart attack. Take all cardiac medications as prescribed - notify your doctor if you have any side effects. Follow cardiac diet - avoid fatty & fried foods, don't eat too much red meat, eat lots of fruits & vegetables, and dairy products should be low fat. Please lose weight if you are overweight. Become more active with walking, gardening, or any other activity that gets you to moving.  Do not use any illicit drugs, avoid alcohol and tobacco.  Please see PCP for further blood pressure management and to recheck your lab work   Please return to the emergency department immediately for any new or concerning symptoms, or if you get worse.
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
## 2023-12-31 NOTE — ED Triage Notes (Signed)
 Patient arrives via POV for chest pain x a few days. Patient was picking up medicine at community pharmacy when he endorsed chest pain and was encouraged to be evaluated. Patient endorses center pain is intermittent and increases with movement, without endorsed radiation. Patient endorses high blood pressure with medication regimen. Patient arrives alert and ambulatory. GCS 15.

## 2023-12-31 NOTE — ED Notes (Signed)
 CCMD is monitoring this patient.

## 2024-01-04 ENCOUNTER — Other Ambulatory Visit: Payer: Self-pay

## 2024-01-04 ENCOUNTER — Emergency Department (HOSPITAL_COMMUNITY)
Admission: EM | Admit: 2024-01-04 | Discharge: 2024-01-04 | Disposition: A | Discharge status: Home / Self Care | Attending: Emergency Medicine | Admitting: Emergency Medicine

## 2024-01-04 ENCOUNTER — Emergency Department (HOSPITAL_COMMUNITY)

## 2024-01-04 DIAGNOSIS — N189 Chronic kidney disease, unspecified: Secondary | ICD-10-CM | POA: Diagnosis not present

## 2024-01-04 DIAGNOSIS — H9191 Unspecified hearing loss, right ear: Secondary | ICD-10-CM | POA: Diagnosis present

## 2024-01-04 DIAGNOSIS — I2510 Atherosclerotic heart disease of native coronary artery without angina pectoris: Secondary | ICD-10-CM | POA: Diagnosis not present

## 2024-01-04 DIAGNOSIS — Z79899 Other long term (current) drug therapy: Secondary | ICD-10-CM | POA: Insufficient documentation

## 2024-01-04 DIAGNOSIS — I129 Hypertensive chronic kidney disease with stage 1 through stage 4 chronic kidney disease, or unspecified chronic kidney disease: Secondary | ICD-10-CM | POA: Diagnosis not present

## 2024-01-04 DIAGNOSIS — Z7982 Long term (current) use of aspirin: Secondary | ICD-10-CM | POA: Diagnosis not present

## 2024-01-04 DIAGNOSIS — Z720 Tobacco use: Secondary | ICD-10-CM | POA: Insufficient documentation

## 2024-01-04 LAB — CBC WITH DIFFERENTIAL/PLATELET
Abs Immature Granulocytes: 0.01 K/uL (ref 0.00–0.07)
Basophils Absolute: 0.1 K/uL (ref 0.0–0.1)
Basophils Relative: 1 %
Eosinophils Absolute: 0.1 K/uL (ref 0.0–0.5)
Eosinophils Relative: 2 %
HCT: 49.9 % (ref 39.0–52.0)
Hemoglobin: 16.8 g/dL (ref 13.0–17.0)
Immature Granulocytes: 0 %
Lymphocytes Relative: 26 %
Lymphs Abs: 1.5 K/uL (ref 0.7–4.0)
MCH: 31.2 pg (ref 26.0–34.0)
MCHC: 33.7 g/dL (ref 30.0–36.0)
MCV: 92.8 fL (ref 80.0–100.0)
Monocytes Absolute: 0.3 K/uL (ref 0.1–1.0)
Monocytes Relative: 6 %
Neutro Abs: 3.8 K/uL (ref 1.7–7.7)
Neutrophils Relative %: 65 %
Platelets: 143 K/uL — ABNORMAL LOW (ref 150–400)
RBC: 5.38 MIL/uL (ref 4.22–5.81)
RDW: 12.9 % (ref 11.5–15.5)
WBC: 5.9 K/uL (ref 4.0–10.5)
nRBC: 0 % (ref 0.0–0.2)

## 2024-01-04 LAB — BASIC METABOLIC PANEL WITH GFR
Anion gap: 7 (ref 5–15)
BUN: 25 mg/dL — ABNORMAL HIGH (ref 8–23)
CO2: 25 mmol/L (ref 22–32)
Calcium: 8.9 mg/dL (ref 8.9–10.3)
Chloride: 106 mmol/L (ref 98–111)
Creatinine, Ser: 1.66 mg/dL — ABNORMAL HIGH (ref 0.61–1.24)
GFR, Estimated: 45 mL/min — ABNORMAL LOW (ref >60)
Glucose, Bld: 114 mg/dL — ABNORMAL HIGH (ref 70–99)
Potassium: 3.7 mmol/L (ref 3.5–5.1)
Sodium: 138 mmol/L (ref 135–145)

## 2024-01-04 LAB — TROPONIN I (HIGH SENSITIVITY)
Troponin I (High Sensitivity): 25 ng/L — ABNORMAL HIGH (ref <18)
Troponin I (High Sensitivity): 26 ng/L — ABNORMAL HIGH (ref <18)

## 2024-01-04 MED ORDER — AMOXICILLIN-POT CLAVULANATE 875-125 MG PO TABS: 1.0000 | ORAL_TABLET | Freq: Two times a day (BID) | ORAL | 0 refills | Status: DC

## 2024-01-04 MED ORDER — LORAZEPAM 1 MG PO TABS
1.0000 mg | ORAL_TABLET | ORAL | Status: DC | PRN
  Administered 2024-01-04: 1 mg via ORAL
  Filled 2024-01-04: qty 1

## 2024-01-04 NOTE — ED Triage Notes (Signed)
 Pt states that he woke up this morning with decreased hearing in the right ear. Pt states that he feels pressure in the canal as if he has a blockage. Pt denies pain at this time. Pt denies dizziness.

## 2024-01-04 NOTE — ED Provider Notes (Signed)
 [place] EMERGENCY DEPARTMENT AT [facility] Provider Note   CSN: [id number] Arrival date & time: 01/04/24  9277     Patient presents with: Hearing Problem (Right ear)   Corey Guerrero is a 68 y.o. male.   The history is provided by the patient and medical records. No language interpreter was used.     68 year old male history of hypertension, chronic kidney disease, substance use presenting with complaints of hearing changes.  Patient report this morning he woke up noticing decreased hearing from his right ear.  Felt like there is some pressure in his ear and sounds diminished.  He hears most all through his left ear.  He does not endorse any significant headache, ear pain, ringing in the ear, jaw pain, or dental pain.  He reports that he noticed some decreased hearing in his left ear yesterday that was transient and resolved.  When asked if he has any chest pain patient states he did have some mild left-sided chest discomfort for the past few days but attributed to lifting a 50 pound bag of sand and carrying it earlier this week.  No active chest pain at this time.  Denies any new changes in medication.  Prior to Admission medications   Medication Sig Start Date End Date Taking? Authorizing Provider  acetaminophen  (TYLENOL ) 500 MG tablet Take 1 tablet (500 mg total) by mouth every 6 (six) hours as needed. 12/24/22   Charlyn Sora, MD  allopurinol  (ZYLOPRIM ) 100 MG tablet Take 1 tablet (100 mg total) by mouth daily. 08/11/23     amLODipine  (NORVASC ) 10 MG tablet Take 1 tablet (10 mg total) by mouth in the morning. 12/24/23   Theotis Haze ORN, NP  aspirin  (ASPIRIN  LOW DOSE) 81 MG chewable tablet Chew 1 tablet (81 mg total) by mouth daily. 10/08/23   Newlin, Enobong, MD  atorvastatin  (LIPITOR) 40 MG tablet Take 1 tablet (40 mg total) by mouth at bedtime. 02/20/23     carvedilol  (COREG ) 12.5 MG tablet Take 1 tablet (12.5 mg total) by mouth 2 (two) times daily with a meal. 12/23/23    Theotis Haze ORN, NP  cloNIDine  (CATAPRES ) 0.1 MG tablet Take 1 tablet (0.1 mg total) by mouth 2 (two) times daily. 11/05/23   Fleming, Zelda W, NP  cyclobenzaprine  (FLEXERIL ) 5 MG tablet Take 1 tablet (5 mg total) by mouth 2 (two) times daily as needed for muscle spasms. 12/31/23   Elnor Jayson LABOR, DO  empagliflozin  (JARDIANCE ) 10 MG TABS tablet Take 1 tablet by mouth daily in the morning for kidney and heart health 12/24/23   Fleming, Zelda W, NP  lisinopril  (ZESTRIL ) 10 MG tablet Take 1 tablet (10 mg total) by mouth daily. 12/24/23   Fleming, Zelda W, NP  multivitamin-iron-minerals-folic acid  (CENTRUM) chewable tablet Chew 1 tablet by mouth daily.    [provider]  omeprazole  (PRILOSEC) 20 MG capsule Take 1 capsule (20 mg total) by mouth daily. 12/23/23   Fleming, Zelda W, NP    Allergies: Bee venom and Desyrel  [trazodone  hcl]    Review of Systems  All other systems reviewed and are negative.   Updated Vital Signs BP (!) 151/101 (BP Location: Left Arm)   Pulse (!) 52   Temp 97.9 F (36.6 C) (Oral)   Resp 17   Ht 5' 6 (1.676 m)   Wt 83 kg   SpO2 96%   BMI 29.53 kg/m   Physical Exam Constitutional:      General: He is not  in acute distress.    Appearance: He is well-developed.  HENT:     Head: Atraumatic.     Ears:     Comments: Examination of right ear overall reassuring normal ear canal, no cerumen impaction, no TM erythema or perforation noted.  Diminished hearing compared to left  Left ear exam unremarkable Eyes:     Conjunctiva/sclera: Conjunctivae normal.  Cardiovascular:     Rate and Rhythm: Normal rate and regular rhythm.     Pulses: Normal pulses.     Heart sounds: Normal heart sounds.  Pulmonary:     Effort: Pulmonary effort is normal.     Breath sounds: Normal breath sounds.  Abdominal:     Palpations: Abdomen is soft.     Tenderness: There is no abdominal tenderness.  Musculoskeletal:        General: Normal range of motion.     Cervical back: Normal  range of motion and neck supple.  Skin:    Findings: No rash.  Neurological:     Mental Status: He is alert.     (all labs ordered are listed, but only abnormal results are displayed) Labs Reviewed  BASIC METABOLIC PANEL WITH GFR - Abnormal; Notable for the following components:      Result Value   Glucose, Bld 114 (*)    BUN 25 (*)    Creatinine, Ser 1.66 (*)    GFR, Estimated 45 (*)    All other components within normal limits  CBC WITH DIFFERENTIAL/PLATELET - Abnormal; Notable for the following components:   Platelets 143 (*)    All other components within normal limits  TROPONIN I (HIGH SENSITIVITY) - Abnormal; Notable for the following components:   Troponin I (High Sensitivity) 25 (*)    All other components within normal limits  TROPONIN I (HIGH SENSITIVITY) - Abnormal; Notable for the following components:   Troponin I (High Sensitivity) 26 (*)    All other components within normal limits  SALICYLATE LEVEL    EKG: None  Date: 01/04/2024  Rate: 50  Rhythm: sinus bradycardia  QRS Axis: normal  Intervals: normal  ST/T Wave abnormalities: normal  Conduction Disutrbances: none  Narrative Interpretation:   Old EKG Reviewed: No significant changes noted    Radiology: MR BRAIN WO CONTRAST Result Date: 01/04/2024 CLINICAL DATA:  68 year old male who woke this morning with right side hearing loss. Right ear pressure. Denies pain or dizziness. EXAM: MRI HEAD WITHOUT CONTRAST TECHNIQUE: Multiplanar, multiecho pulse sequences of the brain and surrounding structures were obtained without intravenous contrast. COMPARISON:  Head CT 05/02/2023. FINDINGS: Brain: Normal cerebral volume for age. No restricted diffusion to suggest acute infarction. No midline shift, mass effect, evidence of mass lesion, ventriculomegaly, extra-axial collection or acute intracranial hemorrhage. Cervicomedullary junction and pituitary are within normal limits. Patchy and widely scattered cerebral white  matter T2 and FLAIR hyperintensity in both hemispheres, relatively symmetric. Configuration is nonspecific. No cortical encephalomalacia identified. No chronic cerebral blood products identified on SWI. Deep gray nuclei minimal signal heterogeneity. Brainstem and cerebellum appear negative. Vascular: Major intracranial vascular flow voids are preserved. Skull and upper cervical spine: Cervical spine disc and endplate degeneration below C2-C3. Visualized bone marrow signal is within normal limits. Sinuses/Orbits: Negative orbits. Paranasal sinuses are well aerated. Other: Visible internal auditory structures appear symmetric, grossly normal. Mastoids are clear. There does appear to be some opacification of the medial right external auditory canal near the tympanic membrane level on series 8, image 6. Normal stylomastoid foramina. Negative visible  scalp and face. IMPRESSION: 1. No acute intracranial abnormality. Moderate for age but nonspecific cerebral white matter signal changes, most commonly due to chronic small vessel disease. 2. Asymmetric signal at or near the right tympanic membrane. Query fluid or opacity in the right external auditory canal or the right tympanic cavity. Otherwise negative visible internal auditory structures. Electronically Signed   By: VEAR Hurst M.D.   On: 01/04/2024 10:05     Procedures   Medications Ordered in the ED  LORazepam  (ATIVAN ) tablet 1 mg (1 mg Oral Given 01/04/24 0840)                                    Medical Decision Making Amount and/or Complexity of Data Reviewed Labs: ordered. Radiology: ordered.  Risk Prescription drug management.   BP (!) 151/101 (BP Location: Left Arm)   Pulse (!) 52   Temp 97.9 F (36.6 C) (Oral)   Resp 17   Ht 5' 6 (1.676 m)   Wt 83 kg   SpO2 96%   BMI 29.53 kg/m   26:22 AM   68 year old male history of hypertension, chronic kidney disease, substance use presenting with complaints of hearing changes.  Patient report  this morning he woke up noticing decreased hearing from his right ear.  Felt like there is some pressure in his ear and sounds diminished.  He hears most all through his left ear.  He does not endorse any significant headache, ear pain, ringing in the ear, jaw pain, or dental pain.  He reports that he noticed some decreased hearing in his left ear yesterday that was transient and resolved.  When asked if he has any chest pain patient states he did have some mild left-sided chest discomfort for the past few days but attributed to lifting a 50 pound bag of sand and carrying it earlier this week.  No active chest pain at this time.  Denies any new changes in medication.  Patient also denies any facial tingling sensation, no weakness, no dizziness and no difficulty swallowing.  On exam, patient is resting comfortably appears to be in no acute discomfort.  He does have some diminished ability to hear in his right ear compared to left.  Ear exam overall reassuring normal ear canal no cerumen impaction normal TM no significant effusion or erythema.  No tenderness to palpation of the earlobe.  Left ear exam unremarkable.  Patient is is without any focal neurodeficit, speaking in complete sentences, strength are equal throughout.  -Labs ordered, independently viewed and interpreted by me.  Labs remarkable for troponin is 25, 26.  It is flat.  Patient has elevated troponin in the past.  He does not have any active chest pain.  Doubt active cardiac disease causing symptoms.  Creatinine of 1.66 similar to baseline.  Normal WBC -The patient was maintained on a cardiac monitor.  I personally viewed and interpreted the cardiac monitored which showed an underlying rhythm of: Sinus bradycardia -Imaging independently viewed and interpreted by me and I agree with radiologist's interpretation.  Result remarkable for brain MRI without any acute intracranial abnormalities.  Patient does have asymmetric signal near the right  tympanic membrane, possible fluid opacity in the right external auditory canal wall of the right tympanic cavity.  On visual exam I do not appreciate any significant effusion or any changes to the canal or cerumen impaction.  However, plan to prescribe antibiotic for  potential infection and have patient follow-up closely with ENT for outpt evaluation. -This patient presents to the ED for concern of hearing changes, this involves an extensive number of treatment options, and is a complaint that carries with it a high risk of complications and morbidity.  The differential diagnosis includes neurosensory hearing loss, conductive hearing loss, otitis media, otitis externa, schwannoma, mastoiditis, drug-induced hearing loss -Co morbidities that complicate the patient evaluation includes hypertension, CAD, CKD -Treatment includes Ativan  -Reevaluation of the patient after these medicines showed that the patient stayed the same -PCP office notes or outside notes reviewed -Discussion with attending Dr. Dean -Escalation to admission/observation considered: patients feels much better, is comfortable with discharge, and will follow up with PCP -Prescription medication considered, patient comfortable with augmentin  and ENT f/u -Social Determinant of Health considered which includes tobacco use.          Final diagnoses:  Hearing loss of right ear, unspecified hearing loss type    ED Discharge Orders          Ordered    amoxicillin -clavulanate (AUGMENTIN ) 875-125 MG tablet  Every 12 hours        01/04/24 1214               Nivia Colon, PA-C 01/04/24 1215    Dean Clarity, MD 01/04/24 [phone]

## 2024-01-04 NOTE — ED Notes (Signed)
 DC instructions and scripts reviewed with pt no questions or concerns at this time. Will follow up with ENT

## 2024-01-04 NOTE — Discharge Instructions (Signed)
 You have been evaluated for your hearing loss, MRI of your brain did not show anything concerning except some fluid behind your right ear.  Please take antibiotic as prescribed.  If after 3 to 5 days you notice no improvement please call and follow-up closely with ear nose and throat specialist for outpatient evaluation and management.  Your chest pain is likely due to heavy lifting.  However if your chest pain return please return to the ER for further care.

## 2024-01-04 NOTE — ED Notes (Signed)
 Patient transported to MRI

## 2024-01-05 ENCOUNTER — Other Ambulatory Visit: Payer: Self-pay

## 2024-01-06 ENCOUNTER — Other Ambulatory Visit: Payer: Self-pay

## 2024-01-07 ENCOUNTER — Other Ambulatory Visit (HOSPITAL_COMMUNITY): Payer: Self-pay

## 2024-01-08 ENCOUNTER — Ambulatory Visit (INDEPENDENT_AMBULATORY_CARE_PROVIDER_SITE_OTHER): Admitting: Podiatry

## 2024-01-08 ENCOUNTER — Ambulatory Visit (INDEPENDENT_AMBULATORY_CARE_PROVIDER_SITE_OTHER)

## 2024-01-08 ENCOUNTER — Encounter: Payer: Self-pay | Admitting: Podiatry

## 2024-01-08 DIAGNOSIS — M7752 Other enthesopathy of left foot: Secondary | ICD-10-CM

## 2024-01-08 DIAGNOSIS — M7742 Metatarsalgia, left foot: Secondary | ICD-10-CM | POA: Diagnosis not present

## 2024-01-08 MED ORDER — TRIAMCINOLONE ACETONIDE 10 MG/ML IJ SUSP
5.0000 mg | Freq: Once | INTRAMUSCULAR | Status: AC
  Administered 2024-01-08: 5 mg
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
## 2024-01-10 ENCOUNTER — Other Ambulatory Visit: Payer: Self-pay

## 2024-01-10 ENCOUNTER — Encounter: Payer: Self-pay | Admitting: Nurse Practitioner

## 2024-01-10 ENCOUNTER — Encounter (HOSPITAL_COMMUNITY): Payer: Self-pay

## 2024-01-10 ENCOUNTER — Emergency Department (HOSPITAL_COMMUNITY)
Admission: EM | Admit: 2024-01-10 | Discharge: 2024-01-10 | Disposition: A | Discharge status: Home / Self Care | Attending: Emergency Medicine | Admitting: Emergency Medicine

## 2024-01-10 DIAGNOSIS — R040 Epistaxis: Secondary | ICD-10-CM | POA: Insufficient documentation

## 2024-01-10 DIAGNOSIS — I10 Essential (primary) hypertension: Secondary | ICD-10-CM | POA: Insufficient documentation

## 2024-01-10 DIAGNOSIS — Z7982 Long term (current) use of aspirin: Secondary | ICD-10-CM | POA: Insufficient documentation

## 2024-01-10 DIAGNOSIS — Z79899 Other long term (current) drug therapy: Secondary | ICD-10-CM | POA: Diagnosis not present

## 2024-01-10 MED ORDER — OXYMETAZOLINE HCL 0.05 % NA SOLN
1.0000 | Freq: Once | NASAL | Status: AC
  Administered 2024-01-10: 1 via NASAL
  Filled 2024-01-10: qty 30

## 2024-01-10 MED ORDER — CLONIDINE HCL 0.1 MG PO TABS
0.2000 mg | ORAL_TABLET | Freq: Once | ORAL | Status: DC
  Filled 2024-01-10: qty 2

## 2024-01-10 NOTE — ED Notes (Signed)
 Pt ambulated independently to room/restroom.  Steady gait noted

## 2024-01-10 NOTE — ED Triage Notes (Addendum)
 Pt arrived reporting nose bleed that started this morning, denies injury. Reports history of HBP, states complaint with his medications Bleeding controlled at this time

## 2024-01-10 NOTE — Discharge Instructions (Signed)
 Return for any problem.  ?

## 2024-01-10 NOTE — ED Provider Notes (Signed)
 [place] EMERGENCY DEPARTMENT AT [facility] Provider Note   CSN: [id number] Arrival date & time: 01/10/24  0740     Patient presents with: Epistaxis   Corey Guerrero is a 68 y.o. male.   68 year old male with prior medical history as detailed below presents for evaluation.  Patient reports that he has a longstanding history of high blood pressure.  He took his morning blood pressure medications.  He was on the way to work.  He reports spontaneous right sided epistaxis.  He is applying pressure.  Bleeding is somewhat improved on arrival to the ED.  He denies other complaint.  The history is provided by the patient and medical records.       Prior to Admission medications   Medication Sig Start Date End Date Taking? Authorizing Provider  acetaminophen  (TYLENOL ) 500 MG tablet Take 1 tablet (500 mg total) by mouth every 6 (six) hours as needed. 12/24/22   Charlyn Sora, MD  allopurinol  (ZYLOPRIM ) 100 MG tablet Take 1 tablet (100 mg total) by mouth daily. 08/11/23     amLODipine  (NORVASC ) 10 MG tablet Take 1 tablet (10 mg total) by mouth in the morning. 12/24/23   Fleming, Zelda W, NP  amoxicillin -clavulanate (AUGMENTIN ) 875-125 MG tablet Take 1 tablet by mouth every 12 (twelve) hours. 01/04/24   Nivia Colon, PA-C  aspirin  (ASPIRIN  LOW DOSE) 81 MG chewable tablet Chew 1 tablet (81 mg total) by mouth daily. 10/08/23   Newlin, Enobong, MD  atorvastatin  (LIPITOR) 40 MG tablet Take 1 tablet (40 mg total) by mouth at bedtime. 02/20/23     carvedilol  (COREG ) 12.5 MG tablet Take 1 tablet (12.5 mg total) by mouth 2 (two) times daily with a meal. 12/23/23   Theotis Haze ORN, NP  cloNIDine  (CATAPRES ) 0.1 MG tablet Take 1 tablet (0.1 mg total) by mouth 2 (two) times daily. 11/05/23   Fleming, Zelda W, NP  cyclobenzaprine  (FLEXERIL ) 5 MG tablet Take 1 tablet (5 mg total) by mouth 2 (two) times daily as needed for muscle spasms. 12/31/23   Elnor Jayson LABOR, DO  empagliflozin  (JARDIANCE ) 10 MG TABS  tablet Take 1 tablet by mouth daily in the morning for kidney and heart health 12/24/23   Fleming, Zelda W, NP  lisinopril  (ZESTRIL ) 10 MG tablet Take 1 tablet (10 mg total) by mouth daily. 12/24/23   Theotis Haze ORN, NP  multivitamin-iron-minerals-folic acid  (CENTRUM) chewable tablet Chew 1 tablet by mouth daily.    [provider]  omeprazole  (PRILOSEC) 20 MG capsule Take 1 capsule (20 mg total) by mouth daily. 12/23/23   Fleming, Zelda W, NP    Allergies: Bee venom and Desyrel  [trazodone  hcl]    Review of Systems  All other systems reviewed and are negative.   Updated Vital Signs BP (!) 171/106 (BP Location: Left Arm)   Pulse (!) 55   Temp 98.2 F (36.8 C) (Oral)   Resp 18   Ht 5' 6 (1.676 m)   Wt 83.9 kg   SpO2 96%   BMI 29.86 kg/m   Physical Exam Vitals and nursing note reviewed.  Constitutional:      General: He is not in acute distress.    Appearance: Normal appearance. He is well-developed.  HENT:     Head: Normocephalic and atraumatic.     Nose:     Comments: Epistaxis to right nare Eyes:     Conjunctiva/sclera: Conjunctivae normal.     Pupils: Pupils are equal, round, and reactive to light.  Cardiovascular:     Rate and Rhythm: Normal rate and regular rhythm.     Heart sounds: Normal heart sounds.  Pulmonary:     Effort: Pulmonary effort is normal. No respiratory distress.     Breath sounds: Normal breath sounds.  Abdominal:     General: There is no distension.     Palpations: Abdomen is soft.     Tenderness: There is no abdominal tenderness.  Musculoskeletal:        General: No deformity. Normal range of motion.     Cervical back: Normal range of motion and neck supple.  Skin:    General: Skin is warm and dry.  Neurological:     General: No focal deficit present.     Mental Status: He is alert and oriented to person, place, and time.     (all labs ordered are listed, but only abnormal results are displayed) Labs Reviewed - No data to  display  EKG: None  Radiology: DG Foot Complete Left Result Date: 01/08/2024 Please see detailed radiograph report in office note.    Procedures   Medications Ordered in the ED  oxymetazoline  (AFRIN) 0.05 % nasal spray 1 spray (1 spray Each Nare Given by Other 01/10/24 0818)                                    Medical Decision Making Risk OTC drugs. Prescription drug management.    Medical Screen Complete  This patient presented to the ED with complaint of epistaxis.  This complaint involves an extensive number of treatment options. The initial differential diagnosis includes, but is not limited to, epistaxis  This presentation is: Acute and Self-Limited  Patient presents with epistaxis.  With pressure and application of Afrin his bleeding is controlled.  Importance of close follow-up is stressed.  Strict return precautions given understood.  Co morbidities that complicated the patient's evaluation  Hypertension   Additional history obtained:  External records from outside sources obtained and reviewed including prior ED visits and prior Inpatient records.   Problem List / ED Course:  Epistaxis   Reevaluation:  After the interventions noted above, I reevaluated the patient and found that they have: resolved Disposition:  After consideration of the diagnostic results and the patients response to treatment, I feel that the patent would benefit from close outpatient follow-up.       Final diagnoses:  Epistaxis    ED Discharge Orders     None          Laurice Maude BROCKS, MD 01/10/24 [phone]

## 2024-01-24 ENCOUNTER — Other Ambulatory Visit: Payer: Self-pay

## 2024-01-29 ENCOUNTER — Encounter: Payer: Self-pay | Admitting: Podiatry

## 2024-01-29 ENCOUNTER — Other Ambulatory Visit: Payer: Self-pay

## 2024-01-29 ENCOUNTER — Ambulatory Visit: Admitting: Podiatry

## 2024-01-29 DIAGNOSIS — M65979 Unspecified synovitis and tenosynovitis, unspecified ankle and foot: Secondary | ICD-10-CM

## 2024-01-29 DIAGNOSIS — M65872 Other synovitis and tenosynovitis, left ankle and foot: Secondary | ICD-10-CM

## 2024-01-29 MED ORDER — TRIAMCINOLONE ACETONIDE 10 MG/ML IJ SUSP
5.0000 mg | Freq: Once | INTRAMUSCULAR | Status: AC
Start: 2024-01-29 — End: 2024-01-29
  Administered 2024-01-29: 5 mg
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
## 2024-01-29 NOTE — Patient Instructions (Signed)
 Look up 2nd toe MPJ capsulitis/ plantar plate taping technique.  Maintain taping technique on the second toe left foot for the next 2 to 3 weeks when you are weightbearing on your foot.  Once you do the taping, you may leave it in place for several days at a time and we can do it daily.  It is not necessary to sleep with the taping applied.

## 2024-01-30 ENCOUNTER — Telehealth: Payer: Self-pay | Admitting: Nurse Practitioner

## 2024-01-30 NOTE — Telephone Encounter (Signed)
 Called patient to confirm upcoming appointment 02/02/2024 at 8:50 am. Patient appointment has been successfully confirmed

## 2024-02-02 ENCOUNTER — Other Ambulatory Visit: Payer: Self-pay

## 2024-02-02 ENCOUNTER — Ambulatory Visit: Attending: Nurse Practitioner | Admitting: Nurse Practitioner

## 2024-02-02 ENCOUNTER — Encounter: Payer: Self-pay | Admitting: Nurse Practitioner

## 2024-02-02 VITALS — BP 125/80 | HR 85 | Resp 18 | Ht 66.0 in | Wt 186.0 lb

## 2024-02-02 DIAGNOSIS — M1A00X0 Idiopathic chronic gout, unspecified site, without tophus (tophi): Secondary | ICD-10-CM

## 2024-02-02 DIAGNOSIS — K219 Gastro-esophageal reflux disease without esophagitis: Secondary | ICD-10-CM | POA: Diagnosis not present

## 2024-02-02 DIAGNOSIS — I10 Essential (primary) hypertension: Secondary | ICD-10-CM

## 2024-02-02 MED ORDER — OMEPRAZOLE 20 MG PO CPDR
20.0000 mg | DELAYED_RELEASE_CAPSULE | Freq: Every day | ORAL | 3 refills | Status: AC
  Filled 2024-02-02 – 2024-04-03 (×2): qty 90, 90d supply, fill #0
  Filled 2024-07-10: qty 90, 90d supply, fill #1

## 2024-02-02 MED ORDER — ALLOPURINOL 100 MG PO TABS
100.0000 mg | ORAL_TABLET | Freq: Every day | ORAL | 6 refills | Status: AC
  Filled 2024-02-02 – 2024-03-19 (×2): qty 30, 30d supply, fill #0
  Filled 2024-05-11: qty 30, 30d supply, fill #1
  Filled 2024-06-12: qty 30, 30d supply, fill #2
  Filled 2024-07-10: qty 30, 30d supply, fill #3
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
## 2024-02-05 ENCOUNTER — Encounter (HOSPITAL_COMMUNITY): Payer: Self-pay | Admitting: Nephrology

## 2024-02-05 ENCOUNTER — Other Ambulatory Visit (HOSPITAL_COMMUNITY): Payer: Self-pay | Admitting: Nephrology

## 2024-02-05 DIAGNOSIS — R809 Proteinuria, unspecified: Secondary | ICD-10-CM

## 2024-02-06 ENCOUNTER — Other Ambulatory Visit: Payer: Self-pay

## 2024-02-10 LAB — LAB REPORT - SCANNED
Albumin, Urine POC: 878.5
Creatinine, POC: 105.3 mg/dL
EGFR: 45
Microalb Creat Ratio: 834

## 2024-02-11 ENCOUNTER — Other Ambulatory Visit: Payer: Self-pay

## 2024-02-20 ENCOUNTER — Other Ambulatory Visit: Payer: Self-pay

## 2024-02-23 ENCOUNTER — Other Ambulatory Visit: Payer: Self-pay | Admitting: Radiology

## 2024-02-23 ENCOUNTER — Other Ambulatory Visit: Payer: Self-pay

## 2024-02-23 DIAGNOSIS — R809 Proteinuria, unspecified: Secondary | ICD-10-CM
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
## 2024-02-24 ENCOUNTER — Encounter (HOSPITAL_COMMUNITY): Payer: Self-pay

## 2024-02-24 ENCOUNTER — Other Ambulatory Visit: Payer: Self-pay

## 2024-02-24 ENCOUNTER — Ambulatory Visit (HOSPITAL_COMMUNITY)
Admission: RE | Admit: 2024-02-24 | Discharge: 2024-02-24 | Disposition: A | Discharge status: Home / Self Care | Source: Ambulatory Visit | Attending: Nephrology | Admitting: Nephrology

## 2024-02-24 DIAGNOSIS — R809 Proteinuria, unspecified: Secondary | ICD-10-CM | POA: Insufficient documentation

## 2024-02-24 DIAGNOSIS — I129 Hypertensive chronic kidney disease with stage 1 through stage 4 chronic kidney disease, or unspecified chronic kidney disease: Secondary | ICD-10-CM | POA: Diagnosis not present

## 2024-02-24 DIAGNOSIS — N189 Chronic kidney disease, unspecified: Secondary | ICD-10-CM | POA: Insufficient documentation

## 2024-02-24 LAB — CBC
HCT: 50.3 % (ref 39.0–52.0)
Hemoglobin: 17 g/dL (ref 13.0–17.0)
MCH: 31.5 pg (ref 26.0–34.0)
MCHC: 33.8 g/dL (ref 30.0–36.0)
MCV: 93.3 fL (ref 80.0–100.0)
Platelets: 147 K/uL — ABNORMAL LOW (ref 150–400)
RBC: 5.39 MIL/uL (ref 4.22–5.81)
RDW: 12.8 % (ref 11.5–15.5)
WBC: 6.1 K/uL (ref 4.0–10.5)
nRBC: 0 % (ref 0.0–0.2)

## 2024-02-24 LAB — PROTIME-INR
INR: 1 (ref 0.8–1.2)
Prothrombin Time: 14 s (ref 11.4–15.2)

## 2024-02-24 MED ORDER — MIDAZOLAM HCL 2 MG/2ML IJ SOLN
INTRAMUSCULAR | Status: AC | PRN
  Administered 2024-02-24 (×3): .5 mg via INTRAVENOUS

## 2024-02-24 MED ORDER — HYDRALAZINE HCL 20 MG/ML IJ SOLN
10.0000 mg | Freq: Once | INTRAMUSCULAR | Status: AC
  Administered 2024-02-24: 10 mg via INTRAVENOUS

## 2024-02-24 MED ORDER — FENTANYL CITRATE (PF) 100 MCG/2ML IJ SOLN
INTRAMUSCULAR | Status: AC | PRN
  Administered 2024-02-24: 25 ug via INTRAVENOUS
  Administered 2024-02-24: 50 ug via INTRAVENOUS

## 2024-02-24 MED ORDER — MIDAZOLAM HCL 2 MG/2ML IJ SOLN
INTRAMUSCULAR | Status: AC
Start: 2024-02-24 — End: 2024-02-24
  Filled 2024-02-24: qty 2

## 2024-02-24 MED ORDER — LIDOCAINE HCL (PF) 1 % IJ SOLN
10.0000 mL | Freq: Once | INTRAMUSCULAR | Status: AC
  Administered 2024-02-24: 10 mL via INTRADERMAL

## 2024-02-24 MED ORDER — GELATIN ABSORBABLE 12-7 MM EX MISC
1.0000 | Freq: Once | CUTANEOUS | Status: AC
  Administered 2024-02-24: 1 via TOPICAL

## 2024-02-24 MED ORDER — HYDRALAZINE HCL 20 MG/ML IJ SOLN
INTRAMUSCULAR | Status: AC
Start: 2024-02-24 — End: 2024-02-24
  Filled 2024-02-24: qty 1

## 2024-02-24 MED ORDER — HYDRALAZINE HCL 20 MG/ML IJ SOLN
INTRAMUSCULAR | Status: AC
  Filled 2024-02-24: qty 1

## 2024-02-24 MED ORDER — SODIUM CHLORIDE 0.9 % IV SOLN: INTRAVENOUS | Status: DC

## 2024-02-24 MED ORDER — FENTANYL CITRATE (PF) 100 MCG/2ML IJ SOLN
INTRAMUSCULAR | Status: AC
Start: 2024-02-24 — End: 2024-02-24
  Filled 2024-02-24: qty 2

## 2024-02-24 NOTE — Procedures (Signed)
 Interventional Radiology Procedure Note  Procedure: Ultrasound guided left renal biopsy  Findings: Please refer to procedural dictation for full description. 18 ga core x2 from inferior left pole cortex.  Gelfoam slurry needle track embolization.  Complications: None immediate  Estimated Blood Loss: < 5 ml  Recommendations: Strict 3 hour bedrest. Follow Pathology results.   Ester Sides, MD

## 2024-02-24 NOTE — Progress Notes (Signed)
 Up and walked and voided clear urine

## 2024-02-26 ENCOUNTER — Ambulatory Visit (INDEPENDENT_AMBULATORY_CARE_PROVIDER_SITE_OTHER): Admitting: Podiatry

## 2024-02-26 ENCOUNTER — Encounter: Payer: Self-pay | Admitting: Podiatry

## 2024-02-26 ENCOUNTER — Encounter (HOSPITAL_COMMUNITY): Payer: Self-pay

## 2024-02-26 DIAGNOSIS — M7752 Other enthesopathy of left foot: Secondary | ICD-10-CM

## 2024-02-26 DIAGNOSIS — M7742 Metatarsalgia, left foot: Secondary | ICD-10-CM

## 2024-02-26 DIAGNOSIS — S93312A Subluxation of tarsal joint of left foot, initial encounter: Secondary | ICD-10-CM

## 2024-02-26 LAB — SURGICAL PATHOLOGY
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
## 2024-02-26 NOTE — Patient Instructions (Signed)
 Recommend looking at [place]  foot doctors on YouTube and finding videos for cuboid massage technique/cuboid syndrome and second MPJ capsulitis taping technique.  Continue with these over the next several weeks.  If pain from outside your foot does not improve over the next couple weeks you may need time in a walking boot.

## 2024-02-27 ENCOUNTER — Encounter: Payer: Self-pay | Admitting: Podiatry

## 2024-02-27 ENCOUNTER — Other Ambulatory Visit: Payer: Self-pay

## 2024-02-29 ENCOUNTER — Encounter: Payer: Self-pay | Admitting: Nurse Practitioner

## 2024-03-02 NOTE — Telephone Encounter (Signed)
 Please schedule VV for tomorrow. Thank you.

## 2024-03-09 ENCOUNTER — Telehealth: Admitting: Nurse Practitioner

## 2024-03-10 ENCOUNTER — Other Ambulatory Visit: Payer: Self-pay

## 2024-03-10 ENCOUNTER — Telehealth (HOSPITAL_BASED_OUTPATIENT_CLINIC_OR_DEPARTMENT_OTHER): Admitting: Nurse Practitioner

## 2024-03-10 ENCOUNTER — Encounter: Payer: Self-pay | Admitting: Nurse Practitioner

## 2024-03-10 DIAGNOSIS — R079 Chest pain, unspecified: Secondary | ICD-10-CM | POA: Diagnosis not present

## 2024-03-10 MED ORDER — DICLOFENAC SODIUM 1 % EX GEL
4.0000 g | Freq: Four times a day (QID) | CUTANEOUS | 1 refills | Status: DC
  Filled 2024-03-10: qty 200, 13d supply, fill #0

## 2024-03-10 MED ORDER — CYCLOBENZAPRINE HCL 5 MG PO TABS
5.0000 mg | ORAL_TABLET | Freq: Three times a day (TID) | ORAL | 1 refills | Status: DC | PRN
  Filled 2024-03-10: qty 30, 10d supply, fill #0

## 2024-03-10 NOTE — Progress Notes (Signed)
 Virtual Visit Consent   Alm LITTIE Hoyle, you are scheduled for a virtual visit with a [facility] provider today. Just as with appointments in the office, your consent must be obtained to participate. Your consent will be active for this visit and any virtual visit you may have with one of our providers in the next 365 days. If you have a MyChart account, a copy of this consent can be sent to you electronically.  As this is a virtual visit, video technology does not allow for your provider to perform a traditional examination. This may limit your provider's ability to fully assess your condition. If your provider identifies any concerns that need to be evaluated in person or the need to arrange testing (such as labs, EKG, etc.), we will make arrangements to do so. Although advances in technology are sophisticated, we cannot ensure that it will always work on either your end or our end. If the connection with a video visit is poor, the visit may have to be switched to a telephone visit. With either a video or telephone visit, we are not always able to ensure that we have a secure connection.  By engaging in this virtual visit, you consent to the provision of healthcare and authorize for your insurance to be billed (if applicable) for the services provided during this visit. Depending on your insurance coverage, you may receive a charge related to this service.  I need to obtain your verbal consent now. Are you willing to proceed with your visit today? Alm LITTIE Hoyle has provided verbal consent on 03/10/2024 for a virtual visit (video or telephone). Haze LELON Servant, NP  Date: 03/10/2024 1:45 PM   Virtual Visit via Video Note   I, Haze LELON Servant, connected with  JAIDEEP POLLACK  ([id number], Sep 10, 1955) on 03/10/24 at  1:30 PM EDT by a video-enabled telemedicine application and verified that I am speaking with the correct person using two identifiers.  Location: Patient: Virtual Visit Location Patient:  Home Provider: Virtual Visit Location Provider: Home Office   I discussed the limitations of evaluation and management by telemedicine and the availability of in person appointments. The patient expressed understanding and agreed to proceed.    History of Present Illness: BRALYN FOLKERT is a 68 y.o. who identifies as a male who was assigned male at birth, and is being seen today for left-sided chest pain.  Mr. Lott has been experiencing pain in the left side of his chest under the left axilla over the past week.  Pain is described as soreness.  It is reproducible. Denies any heavy lifting. He does not tend to sleep on his left side and usually sleeps on his back. Denies shortness of breath. He has not taken any OTC medication for pain.    Problems:  Patient Active Problem List   Diagnosis Date Noted   Chronic hepatitis C without hepatic coma (HCC) 04/29/2023   Aortic atherosclerosis (HCC) 01/15/2022   Mixed hyperlipidemia 01/15/2022   Pain in right foot 08/22/2021   Obstructive sleep apnea 08/06/2021   Accelerated hypertension 03/23/2020   Chronic kidney disease (CKD), stage IV (severe) (HCC) 08/18/2018   Noncompliance with medications    Poorly-controlled hypertension    CKD (chronic kidney disease) stage 3, GFR 30-59 ml/min (HCC)    Hypertensive emergency 02/25/2018   Hypertensive urgency, malignant 02/24/2018   Strain of calf muscle, initial encounter 06/14/2016   Intractable tension-type headache 04/01/2016   Acute allergic rhinitis 04/01/2016   Bradycardia  04/28/2015   Muscle right arm weakness    Olecranon bursitis of right elbow 01/19/2015   Erectile dysfunction 01/19/2015   CKD (chronic kidney disease) stage 2, GFR 60-89 ml/min 10/15/2011   Essential hypertension 05/20/2011   Hypokalemia 05/20/2011   Abnormal EKG 05/20/2011    Allergies:  Allergies  Allergen Reactions   Bee Venom Anaphylaxis   Desyrel  [Trazodone  Hcl] Anaphylaxis and Shortness Of Breath     Patient STOPPED BREATHING!!   Medications:  Current Outpatient Medications:    cyclobenzaprine  (FLEXERIL ) 5 MG tablet, Take 1 tablet (5 mg total) by mouth 3 (three) times daily as needed for muscle spasms., Disp: 30 tablet, Rfl: 1   diclofenac  Sodium (VOLTAREN ) 1 % GEL, Apply 4 g topically 4 (four) times daily., Disp: 200 g, Rfl: 1   acetaminophen  (TYLENOL ) 500 MG tablet, Take 1 tablet (500 mg total) by mouth every 6 (six) hours as needed., Disp: 30 tablet, Rfl: 0   allopurinol  (ZYLOPRIM ) 100 MG tablet, Take 1 tablet (100 mg total) by mouth daily., Disp: 30 tablet, Rfl: 6   amLODipine  (NORVASC ) 10 MG tablet, Take 1 tablet (10 mg total) by mouth in the morning., Disp: 90 tablet, Rfl: 3   aspirin  (ASPIRIN  LOW DOSE) 81 MG chewable tablet, Chew 1 tablet (81 mg total) by mouth daily., Disp: 90 tablet, Rfl: 1   atorvastatin  (LIPITOR) 40 MG tablet, Take 1 tablet (40 mg total) by mouth at bedtime., Disp: 90 tablet, Rfl: 3   carvedilol  (COREG ) 12.5 MG tablet, Take 1 tablet (12.5 mg total) by mouth 2 (two) times daily with a meal., Disp: 180 tablet, Rfl: 1   cloNIDine  (CATAPRES ) 0.1 MG tablet, Take 1 tablet (0.1 mg total) by mouth 2 (two) times daily., Disp: 180 tablet, Rfl: 1   empagliflozin  (JARDIANCE ) 10 MG TABS tablet, Take 1 tablet (10 mg total) by mouth in the morning for kidney and heart health, Disp: 90 tablet, Rfl: 3   lisinopril  (ZESTRIL ) 10 MG tablet, Take 1 tablet (10 mg total) by mouth daily., Disp: 90 tablet, Rfl: 3   multivitamin-iron-minerals-folic acid  (CENTRUM) chewable tablet, Chew 1 tablet by mouth daily., Disp: , Rfl:    omeprazole  (PRILOSEC) 20 MG capsule, Take 1 capsule (20 mg total) by mouth daily., Disp: 90 capsule, Rfl: 3  Observations/Objective: Patient is well-developed, well-nourished in no acute distress.  Resting comfortably at home.  Head is normocephalic, atraumatic.  No labored breathing.  Speech is clear and coherent with logical content.  Patient is alert and  oriented at baseline.    Assessment and Plan: 1. Left-sided chest pain (Primary) - diclofenac  Sodium (VOLTAREN ) 1 % GEL; Apply 4 g topically 4 (four) times daily.  Dispense: 200 g; Refill: 1 - cyclobenzaprine  (FLEXERIL ) 5 MG tablet; Take 1 tablet (5 mg total) by mouth 3 (three) times daily as needed for muscle spasms.  Dispense: 30 tablet; Refill: 1  Follow up in office next week if pain persistent  Follow Up Instructions: I discussed the assessment and treatment plan with the patient. The patient was provided an opportunity to ask questions and all were answered. The patient agreed with the plan and demonstrated an understanding of the instructions.  A copy of instructions were sent to the patient via MyChart unless otherwise noted below.    The patient was advised to call back or seek an in-person evaluation if the symptoms worsen or if the condition fails to improve as anticipated.    Seiya Silsby W Meldon Hanzlik, NP

## 2024-03-11 ENCOUNTER — Other Ambulatory Visit: Payer: Self-pay

## 2024-03-19 ENCOUNTER — Other Ambulatory Visit: Payer: Self-pay

## 2024-03-23 ENCOUNTER — Encounter: Payer: Self-pay | Admitting: Nurse Practitioner

## 2024-03-23 ENCOUNTER — Other Ambulatory Visit: Payer: Self-pay

## 2024-03-24 ENCOUNTER — Emergency Department (HOSPITAL_BASED_OUTPATIENT_CLINIC_OR_DEPARTMENT_OTHER)
Admission: EM | Admit: 2024-03-24 | Discharge: 2024-03-24 | Disposition: A | Discharge status: Home / Self Care | Attending: Emergency Medicine | Admitting: Emergency Medicine

## 2024-03-24 ENCOUNTER — Other Ambulatory Visit: Payer: Self-pay

## 2024-03-24 ENCOUNTER — Emergency Department (HOSPITAL_BASED_OUTPATIENT_CLINIC_OR_DEPARTMENT_OTHER)

## 2024-03-24 ENCOUNTER — Encounter (HOSPITAL_BASED_OUTPATIENT_CLINIC_OR_DEPARTMENT_OTHER): Payer: Self-pay | Admitting: Emergency Medicine

## 2024-03-24 ENCOUNTER — Other Ambulatory Visit (HOSPITAL_BASED_OUTPATIENT_CLINIC_OR_DEPARTMENT_OTHER): Payer: Self-pay

## 2024-03-24 DIAGNOSIS — R519 Headache, unspecified: Secondary | ICD-10-CM

## 2024-03-24 DIAGNOSIS — I10 Essential (primary) hypertension: Secondary | ICD-10-CM | POA: Insufficient documentation

## 2024-03-24 DIAGNOSIS — Z7982 Long term (current) use of aspirin: Secondary | ICD-10-CM | POA: Insufficient documentation

## 2024-03-24 DIAGNOSIS — Z79899 Other long term (current) drug therapy: Secondary | ICD-10-CM | POA: Insufficient documentation

## 2024-03-24 LAB — CBC WITH DIFFERENTIAL/PLATELET
Abs Immature Granulocytes: 0.01 K/uL (ref 0.00–0.07)
Basophils Absolute: 0 K/uL (ref 0.0–0.1)
Basophils Relative: 1 %
Eosinophils Absolute: 0.1 K/uL (ref 0.0–0.5)
Eosinophils Relative: 2 %
HCT: 50.4 % (ref 39.0–52.0)
Hemoglobin: 16.9 g/dL (ref 13.0–17.0)
Immature Granulocytes: 0 %
Lymphocytes Relative: 33 %
Lymphs Abs: 2.2 K/uL (ref 0.7–4.0)
MCH: 31.2 pg (ref 26.0–34.0)
MCHC: 33.5 g/dL (ref 30.0–36.0)
MCV: 93.2 fL (ref 80.0–100.0)
Monocytes Absolute: 0.5 K/uL (ref 0.1–1.0)
Monocytes Relative: 7 %
Neutro Abs: 3.8 K/uL (ref 1.7–7.7)
Neutrophils Relative %: 57 %
Platelets: 131 K/uL — ABNORMAL LOW (ref 150–400)
RBC: 5.41 MIL/uL (ref 4.22–5.81)
RDW: 12.7 % (ref 11.5–15.5)
WBC: 6.6 K/uL (ref 4.0–10.5)
nRBC: 0 % (ref 0.0–0.2)

## 2024-03-24 LAB — COMPREHENSIVE METABOLIC PANEL WITH GFR
ALT: 15 U/L (ref 0–44)
AST: 27 U/L (ref 15–41)
Albumin: 4.4 g/dL (ref 3.5–5.0)
Alkaline Phosphatase: 88 U/L (ref 38–126)
Anion gap: 12 (ref 5–15)
BUN: 21 mg/dL (ref 8–23)
CO2: 26 mmol/L (ref 22–32)
Calcium: 9.8 mg/dL (ref 8.9–10.3)
Chloride: 102 mmol/L (ref 98–111)
Creatinine, Ser: 1.77 mg/dL — ABNORMAL HIGH (ref 0.61–1.24)
GFR, Estimated: 42 mL/min — ABNORMAL LOW (ref >60)
Glucose, Bld: 94 mg/dL (ref 70–99)
Potassium: 3.7 mmol/L (ref 3.5–5.1)
Sodium: 140 mmol/L (ref 135–145)
Total Bilirubin: 1.1 mg/dL (ref 0.0–1.2)
Total Protein: 7.6 g/dL (ref 6.5–8.1)

## 2024-03-24 MED ORDER — KETOROLAC TROMETHAMINE 15 MG/ML IJ SOLN
15.0000 mg | Freq: Once | INTRAMUSCULAR | Status: AC
  Administered 2024-03-24: 15 mg via INTRAVENOUS
  Filled 2024-03-24: qty 1

## 2024-03-24 MED ORDER — CLONIDINE HCL 0.2 MG PO TABS
0.2000 mg | ORAL_TABLET | Freq: Two times a day (BID) | ORAL | 0 refills | Status: DC
  Filled 2024-03-24: qty 60, 30d supply, fill #0

## 2024-03-24 MED ORDER — HYDROCODONE-ACETAMINOPHEN 5-325 MG PO TABS
1.0000 | ORAL_TABLET | Freq: Once | ORAL | Status: AC
  Administered 2024-03-24: 1 via ORAL
  Filled 2024-03-24: qty 1

## 2024-03-24 MED ORDER — CLONIDINE HCL 0.1 MG PO TABS
0.2000 mg | ORAL_TABLET | Freq: Once | ORAL | Status: AC
  Administered 2024-03-24: 0.2 mg via ORAL
  Filled 2024-03-24: qty 2

## 2024-03-24 MED ORDER — SODIUM CHLORIDE 0.9 % IV BOLUS
500.0000 mL | Freq: Once | INTRAVENOUS | Status: AC
  Administered 2024-03-24: 500 mL via INTRAVENOUS

## 2024-03-24 NOTE — Discharge Instructions (Addendum)
 Follow up with your Physician for recheck

## 2024-03-24 NOTE — ED Triage Notes (Signed)
 High BP readings at home with headache for several days. States he is compliant with BP medication.

## 2024-03-24 NOTE — ED Provider Notes (Signed)
 [place] EMERGENCY DEPARTMENT AT [facility] Provider Note   CSN: [id number] Arrival date & time: 03/24/24  9152     Patient presents with: Hypertension   Corey Guerrero is a 68 y.o. male.   Pt complains of high blood pressure and a headache. Pt reports he spoke to his primary care who advised him to come in for evaltuion of his blood pressure.  Pt states he is taking his blood pressure medications.  Pt took his morning doses.  Pt has been taking otc blood pressure medications.  Pt denies any chest pain    The history is provided by the patient. No language interpreter was used.  Hypertension This is a new problem. The current episode started more than 1 week ago. The problem occurs constantly. Associated symptoms include headaches. Pertinent negatives include no chest pain.       Prior to Admission medications   Medication Sig Start Date End Date Taking? Authorizing Provider  cloNIDine  (CATAPRES ) 0.2 MG tablet Take 1 tablet (0.2 mg total) by mouth 2 (two) times daily. 03/24/24  Yes Emonni Depasquale K, PA-C  acetaminophen  (TYLENOL ) 500 MG tablet Take 1 tablet (500 mg total) by mouth every 6 (six) hours as needed. 12/24/22   Charlyn Sora, MD  allopurinol  (ZYLOPRIM ) 100 MG tablet Take 1 tablet (100 mg total) by mouth daily. 02/02/24   Fleming, Zelda W, NP  amLODipine  (NORVASC ) 10 MG tablet Take 1 tablet (10 mg total) by mouth in the morning. 12/24/23   Theotis Haze ORN, NP  aspirin  (ASPIRIN  LOW DOSE) 81 MG chewable tablet Chew 1 tablet (81 mg total) by mouth daily. 10/08/23   Newlin, Enobong, MD  atorvastatin  (LIPITOR) 40 MG tablet Take 1 tablet (40 mg total) by mouth at bedtime. 02/20/23     carvedilol  (COREG ) 12.5 MG tablet Take 1 tablet (12.5 mg total) by mouth 2 (two) times daily with a meal. 12/23/23   Theotis Haze ORN, NP  cyclobenzaprine  (FLEXERIL ) 5 MG tablet Take 1 tablet (5 mg total) by mouth 3 (three) times daily as needed for muscle spasms. 03/10/24   Fleming, Zelda W, NP   diclofenac  Sodium (VOLTAREN ) 1 % GEL Apply 4 g topically 4 (four) times daily. 03/10/24   Fleming, Zelda W, NP  empagliflozin  (JARDIANCE ) 10 MG TABS tablet Take 1 tablet (10 mg total) by mouth in the morning for kidney and heart health 12/24/23   Theotis Haze ORN, NP  lisinopril  (ZESTRIL ) 10 MG tablet Take 1 tablet (10 mg total) by mouth daily. 12/24/23   Fleming, Zelda W, NP  multivitamin-iron-minerals-folic acid  (CENTRUM) chewable tablet Chew 1 tablet by mouth daily.    [provider]  omeprazole  (PRILOSEC) 20 MG capsule Take 1 capsule (20 mg total) by mouth daily. 02/02/24   Fleming, Zelda W, NP    Allergies: Bee venom and Desyrel  [trazodone  hcl]    Review of Systems  Cardiovascular:  Negative for chest pain.  Neurological:  Positive for headaches.  All other systems reviewed and are negative.   Updated Vital Signs BP (!) 143/109   Pulse (!) 43   Temp 98.6 F (37 C)   Resp 12   Ht 5' 6 (1.676 m)   Wt 83.9 kg   SpO2 99%   BMI 29.86 kg/m   Physical Exam Vitals reviewed.  Constitutional:      Appearance: Normal appearance.  HENT:     Right Ear: External ear normal.     Left Ear: External ear normal.  Mouth/Throat:     Mouth: Mucous membranes are moist.  Eyes:     Pupils: Pupils are equal, round, and reactive to light.  Cardiovascular:     Rate and Rhythm: Normal rate.  Pulmonary:     Effort: Pulmonary effort is normal.  Abdominal:     General: Abdomen is flat.  Musculoskeletal:        General: Normal range of motion.  Skin:    General: Skin is warm.  Neurological:     General: No focal deficit present.     Mental Status: He is alert.  Psychiatric:        Mood and Affect: Mood normal.     (all labs ordered are listed, but only abnormal results are displayed) Labs Reviewed  CBC WITH DIFFERENTIAL/PLATELET - Abnormal; Notable for the following components:      Result Value   Platelets 131 (*)    All other components within normal limits   COMPREHENSIVE METABOLIC PANEL WITH GFR - Abnormal; Notable for the following components:   Creatinine, Ser 1.77 (*)    GFR, Estimated 42 (*)    All other components within normal limits    EKG: EKG Interpretation Date/Time:  Wednesday March 24 2024 12:39:29 EDT Ventricular Rate:  45 PR Interval:  152 QRS Duration:  93 QT Interval:  513 QTC Calculation: 444 R Axis:   79  Text Interpretation: Sinus bradycardia Confirmed by Yolande Charleston [phone]) on 03/24/2024 1:01:01 PM  Radiology: CT Head Wo Contrast Result Date: 03/24/2024 CLINICAL DATA:  Provided history: Headache, new onset. EXAM: CT HEAD WITHOUT CONTRAST TECHNIQUE: Contiguous axial images were obtained from the base of the skull through the vertex without intravenous contrast. RADIATION DOSE REDUCTION: This exam was performed according to the departmental dose-optimization program which includes automated exposure control, adjustment of the mA and/or kV according to patient size and/or use of iterative reconstruction technique. COMPARISON:  Brain MRI 01/04/2024.  Head CT 05/02/2023. FINDINGS: Brain: No age-advanced or lobar predominant cerebral atrophy. Patchy and ill-defined hypoattenuation within the cerebral white matter, nonspecific but compatible with mild-to-moderate chronic small vessel ischemic disease. There is no acute intracranial hemorrhage. No demarcated cortical infarct. No extra-axial fluid collection. No evidence of an intracranial mass. No midline shift. Vascular: No hyperdense vessel. Atherosclerotic calcifications. Skull: No calvarial fracture or aggressive osseous lesion. Sinuses/Orbits: No mass or acute finding within the imaged orbits. No significant paranasal sinus disease at the imaged levels. IMPRESSION: 1. No evidence of an acute intracranial abnormality. 2. Mild-to-moderate chronic small vessel ischemic changes within the cerebral white matter. Electronically Signed   By: Rockey Childs D.O.   On: 03/24/2024  12:58     Procedures   Medications Ordered in the ED  cloNIDine  (CATAPRES ) tablet 0.2 mg (0.2 mg Oral Given 03/24/24 1236)  sodium chloride  0.9 % bolus 500 mL ( Intravenous Stopped 03/24/24 1323)  HYDROcodone -acetaminophen  (NORCO/VICODIN) 5-325 MG per tablet 1 tablet (1 tablet Oral Given 03/24/24 1236)  ketorolac  (TORADOL ) 15 MG/ML injection 15 mg (15 mg Intravenous Given 03/24/24 1447)                                    Medical Decision Making Patient complains of a headache and elevated blood pressure.  Patient called his primary care physician who advised him to come to the emergency department for evaluation.  Patient reports he is taking his blood pressure medicine.  Amount and/or Complexity of  Data Reviewed Labs: ordered. Decision-making details documented in ED Course.    Details: Labs ordered reviewed and interpreted GFR is 42 Radiology: ordered and independent interpretation performed. Decision-making details documented in ED Course.    Details: CT head no acute findings  Risk Prescription drug management.        Final diagnoses:  Hypertension, unspecified type  Nonintractable headache, unspecified chronicity pattern, unspecified headache type    ED Discharge Orders          Ordered    cloNIDine  (CATAPRES ) 0.2 MG tablet  2 times daily        03/24/24 1449           Patient reports feeling better.  I will increase his clonidine .  Patient is advised to follow-up with his primary care physician for further evaluation.  Patient is discharged in stable condition.    Flint Sonny MARLA DEVONNA 03/24/24 1522    Yolande Lamar BROCKS, MD 03/26/24 [phone]

## 2024-04-01 ENCOUNTER — Ambulatory Visit: Admitting: Podiatry

## 2024-04-05 ENCOUNTER — Other Ambulatory Visit: Payer: Self-pay

## 2024-04-12 ENCOUNTER — Other Ambulatory Visit: Payer: Self-pay

## 2024-04-16 ENCOUNTER — Encounter: Payer: Self-pay | Admitting: Nurse Practitioner

## 2024-04-17 ENCOUNTER — Other Ambulatory Visit: Payer: Self-pay

## 2024-04-17 ENCOUNTER — Encounter (HOSPITAL_BASED_OUTPATIENT_CLINIC_OR_DEPARTMENT_OTHER): Payer: Self-pay

## 2024-04-17 ENCOUNTER — Other Ambulatory Visit (HOSPITAL_BASED_OUTPATIENT_CLINIC_OR_DEPARTMENT_OTHER): Payer: Self-pay

## 2024-04-17 ENCOUNTER — Other Ambulatory Visit: Payer: Self-pay | Admitting: Nurse Practitioner

## 2024-04-19 ENCOUNTER — Other Ambulatory Visit (HOSPITAL_BASED_OUTPATIENT_CLINIC_OR_DEPARTMENT_OTHER): Payer: Self-pay

## 2024-04-19 MED ORDER — CLONIDINE HCL 0.2 MG PO TABS
0.2000 mg | ORAL_TABLET | Freq: Two times a day (BID) | ORAL | 0 refills | Status: DC
  Filled 2024-04-19: qty 180, 90d supply, fill #0

## 2024-04-22 ENCOUNTER — Other Ambulatory Visit: Payer: Self-pay

## 2024-04-23 ENCOUNTER — Other Ambulatory Visit: Payer: Self-pay | Admitting: Pharmacist

## 2024-04-23 NOTE — Progress Notes (Signed)
 Specialty Pharmacy Ongoing Clinical Assessment Note  Corey Guerrero is a 68 y.o. male who is being followed by the specialty pharmacy service for RxSp Hepatitis C   Patient's specialty medication(s) reviewed today: Glecaprevir -Pibrentasvir  (Mavyret )   Missed doses in the last 4 weeks: 0   Patient/Caregiver did not have any additional questions or concerns.   Therapeutic benefit summary: Patient is achieving benefit   Adverse events/side effects summary: No adverse events/side effects   Patient's therapy is appropriate to: Discontinue    Goals Addressed             This Visit's Progress    COMPLETED: Achieve virologic cure as evidenced by SVR   On track    Patient is initiating therapy. Patient will be evaluated at upcoming provider appointment to assess progress      Maintain optimal adherence to therapy   On track    Patient is initiating therapy. Patient will be evaluated at upcoming provider appointment to assess progress         Follow up: none required -therapy completed  Alan JINNY Geralds Specialty Pharmacist

## 2024-04-26 ENCOUNTER — Ambulatory Visit: Attending: Nurse Practitioner | Admitting: Nurse Practitioner

## 2024-04-26 ENCOUNTER — Other Ambulatory Visit (HOSPITAL_BASED_OUTPATIENT_CLINIC_OR_DEPARTMENT_OTHER): Payer: Self-pay

## 2024-04-26 ENCOUNTER — Other Ambulatory Visit: Payer: Self-pay

## 2024-04-26 VITALS — BP 119/73 | HR 43 | Resp 18 | Ht 66.0 in | Wt 184.6 lb

## 2024-04-26 DIAGNOSIS — R350 Frequency of micturition: Secondary | ICD-10-CM

## 2024-04-26 DIAGNOSIS — Z23 Encounter for immunization: Secondary | ICD-10-CM

## 2024-04-26 DIAGNOSIS — I10 Essential (primary) hypertension: Secondary | ICD-10-CM

## 2024-04-26 DIAGNOSIS — E7800 Pure hypercholesterolemia, unspecified: Secondary | ICD-10-CM | POA: Diagnosis not present

## 2024-04-26 DIAGNOSIS — R001 Bradycardia, unspecified: Secondary | ICD-10-CM | POA: Diagnosis not present

## 2024-04-26 MED ORDER — ASPIRIN 81 MG PO CHEW
81.0000 mg | CHEWABLE_TABLET | Freq: Every day | ORAL | 3 refills | Status: AC
  Filled 2024-04-26 – 2024-06-24 (×2): qty 90, 90d supply, fill #0

## 2024-04-26 MED ORDER — CLONIDINE HCL 0.1 MG PO TABS
0.1000 mg | ORAL_TABLET | Freq: Two times a day (BID) | ORAL | 1 refills | Status: DC
  Filled 2024-04-26: qty 180, 90d supply, fill #0

## 2024-04-26 MED ORDER — CARVEDILOL 12.5 MG PO TABS
12.5000 mg | ORAL_TABLET | Freq: Two times a day (BID) | ORAL | 1 refills | Status: AC
  Filled 2024-04-26 – 2024-07-10 (×2): qty 180, 90d supply, fill #0

## 2024-04-26 MED ORDER — ATORVASTATIN CALCIUM 40 MG PO TABS
40.0000 mg | ORAL_TABLET | Freq: Every evening | ORAL | 3 refills | Status: AC
  Filled 2024-04-26: qty 90, 90d supply, fill #0
  Filled 2024-07-31: qty 90, 90d supply, fill #1
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
## 2024-04-26 NOTE — Patient Instructions (Addendum)
 AM Lisinopril   Clonidine   Carvedilol    PM Amlodipine  Clonidine  Carvedilol    CARDIOLOGY Address: [address] [place], [place] [address] Phone: [phone]

## 2024-04-27 ENCOUNTER — Encounter: Payer: Self-pay | Admitting: Nurse Practitioner

## 2024-04-27 ENCOUNTER — Ambulatory Visit: Payer: Self-pay | Admitting: Nurse Practitioner

## 2024-04-27 ENCOUNTER — Telehealth: Payer: Self-pay | Admitting: Nurse Practitioner

## 2024-04-27 NOTE — Telephone Encounter (Signed)
 Copied from CRM [id number]. Topic: General - Other >> Apr 26, 2024  5:09 PM Hadassah PARAS wrote:  Reason for CRM: Pt is requesting a dr. notes for work. Please add to MyChart. Please advise #[phone]

## 2024-04-27 NOTE — Telephone Encounter (Signed)
 Patient identified by name and date of birth.  Patient stated that he does not need the note for work again.  Due to patient having a low pulse at his 10/27 appointment he mentioned needing a note for work, but stated he didn't need one again.

## 2024-04-28 ENCOUNTER — Encounter: Payer: Self-pay | Admitting: Nurse Practitioner

## 2024-04-29 ENCOUNTER — Other Ambulatory Visit: Payer: Self-pay

## 2024-05-05 ENCOUNTER — Encounter: Payer: Self-pay | Admitting: Nurse Practitioner

## 2024-05-06 ENCOUNTER — Encounter: Payer: Self-pay | Admitting: Podiatry

## 2024-05-06 ENCOUNTER — Ambulatory Visit: Admitting: Podiatry

## 2024-05-06 DIAGNOSIS — M2141 Flat foot [pes planus] (acquired), right foot: Secondary | ICD-10-CM | POA: Diagnosis not present

## 2024-05-06 DIAGNOSIS — M2142 Flat foot [pes planus] (acquired), left foot: Secondary | ICD-10-CM

## 2024-05-06 DIAGNOSIS — M7752 Other enthesopathy of left foot: Secondary | ICD-10-CM | POA: Diagnosis not present

## 2024-05-06 NOTE — Progress Notes (Signed)
"  °  Subjective:  Patient ID: Corey Guerrero, male    DOB: 01-16-1956,  MRN: [id number]  Chief Complaint  Patient presents with   Foot Pain    L 2nd met pain 7.  Causing Patient to limp.  Not diabetic. 81 mg Asprin     Discussed the use of AI scribe software for clinical note transcription with the patient, who gave verbal consent to proceed.  History of Present Illness Corey Guerrero is a 68 year old male who presents with pain and swelling in the left second toe joint.  Pain and swelling have been present in the left second metatarsal phalangeal joint for approximately a week and a half, following mountain climbing without shoes. Pain worsens with plantar flexion of the second toe and direct pressure to the area with weightbearing.  Previous treatments included injections and taping, which were effective until the recent exacerbation. Increased pain has led to a limp.  He typically walks on his toes and is considering footwear with thicker soles to reduce pressure on the foot. Current footwear includes Skechers, and he is exploring options like Hoka's, Brooks, Asics, and New Balance. He is willing to continue using taping techniques and metatarsal pads for joint stabilization and pain management.      Objective:    Physical Exam EXTREMITIES: Recurrent left second MPJ capsulitis, pes planus foot type, elongated second metatarsal. Bunion deformity present.  SKIN: Skin well hydrated with normal texture, no ecchymosis, and no lesions. Vascular: DP and PT pulses palpable 2/4 bilaterally.  Capillary refill less than 3 seconds to the digits. Neurological: Light touch sensation grossly intact bilateral feet.   No images are attached to the encounter.    Results    Assessment:   1. Capsulitis of metatarsophalangeal (MTP) joint of left foot   2. Pes planus of both feet      Plan:  Patient was evaluated and treated and all questions answered.  Assessment and Plan Assessment &  Plan Left second metatarsophalangeal joint capsulitis and metatarsalgia Recurrent capsulitis and metatarsalgia exacerbated by recent activity. Previous interventions provided relief. Conservative management prioritized due to chronic kidney disease. - Resume padding and taping to stabilize joint. - Use metatarsal pads to alleviate pressure. - Consider supportive shoes with thick soles like Hoka, Brooks, Asics, or New Balance. - Explore over-the-counter inserts like Power Steps or Super Feet for arch support. - Perform Achilles stretching exercises to reduce forefoot pressure. - Follow up in 3-4 weeks if symptoms persist.  Left pes planus (flat foot) Pes planus increasing strain on second metatarsophalangeal joint. Arch support emphasized to improve foot mechanics. - Use supportive shoes with good arch support. - Consider over-the-counter inserts like Power Steps or Super Feet for arch support. -Power steps were offered to the patient today which she is deferring for now. - Perform Achilles stretching exercises to improve foot mechanics.  Exacerbated by tight heel cord.  Procedure: Pad strapping of left second toe - Extensor capsulitis taping of the second MPJ was performed today using 3 strands of 1 inch paper tape to splint the second MPJ into neutral position with slight plantarflexion.  Metatarsal pad applied.  Reviewed technique with patient and instructed him to maintain this regularly over the next several weeks.      Return if symptoms worsen or fail to improve, for 2nd toe capsultis.    "

## 2024-05-06 NOTE — Patient Instructions (Signed)
 Achilles Tendinitis  with Rehab Achilles tendinitis is a disorder of the Achilles tendon. The Achilles tendon connects the large calf muscles (Gastrocnemius and Soleus) to the heel bone (calcaneus). This tendon is sometimes called the heel cord. It is important for pushing-off and standing on your toes and is important for walking, running, or jumping. Tendinitis is often caused by overuse and repetitive microtrauma. SYMPTOMS Pain, tenderness, swelling, warmth, and redness may occur over the Achilles tendon even at rest. Pain with pushing off, or flexing or extending the ankle. Pain that is worsened after or during activity. CAUSES  Overuse sometimes seen with rapid increase in exercise programs or in sports requiring running and jumping. Poor physical conditioning (strength and flexibility or endurance). Running sports, especially training running down hills. Inadequate warm-up before practice or play or failure to stretch before participation. Injury to the tendon. PREVENTION  Warm up and stretch before practice or competition. Allow time for adequate rest and recovery between practices and competition. Keep up conditioning. Keep up ankle and leg flexibility. Improve or keep muscle strength and endurance. Improve cardiovascular fitness. Use proper technique. Use proper equipment (shoes, skates). To help prevent recurrence, taping, protective strapping, or an adhesive bandage may be recommended for several weeks after healing is complete. PROGNOSIS  Recovery may take weeks to several months to heal. Longer recovery is expected if symptoms have been prolonged. Recovery is usually quicker if the inflammation is due to a direct blow as compared with overuse or sudden strain. RELATED COMPLICATIONS  Healing time will be prolonged if the condition is not correctly treated. The injury must be given plenty of time to heal. Symptoms can reoccur if activity is resumed too soon. Untreated,  tendinitis may increase the risk of tendon rupture requiring additional time for recovery and possibly surgery. TREATMENT  The first treatment consists of rest anti-inflammatory medication, and ice to relieve the pain. Stretching and strengthening exercises after resolution of pain will likely help reduce the risk of recurrence. Referral to a physical therapist or athletic trainer for further evaluation and treatment may be helpful. A walking boot or cast may be recommended to rest the Achilles tendon. This can help break the cycle of inflammation and microtrauma. Arch supports (orthotics) may be prescribed or recommended by your caregiver as an adjunct to therapy and rest. Surgery to remove the inflamed tendon lining or degenerated tendon tissue is rarely necessary and has shown less than predictable results. MEDICATION  Nonsteroidal anti-inflammatory medications, such as aspirin  and ibuprofen , may be used for pain and inflammation relief. Do not take within 7 days before surgery. Take these as directed by your caregiver. Contact your caregiver immediately if any bleeding, stomach upset, or signs of allergic reaction occur. Other minor pain relievers, such as acetaminophen , may also be used. Pain relievers may be prescribed as necessary by your caregiver. Do not take prescription pain medication for longer than 4 to 7 days. Use only as directed and only as much as you need. Cortisone injections are rarely indicated. Cortisone injections may weaken tendons and predispose to rupture. It is better to give the condition more time to heal than to use them. HEAT AND COLD Cold is used to relieve pain and reduce inflammation for acute and chronic Achilles tendinitis. Cold should be applied for 10 to 15 minutes every 2 to 3 hours for inflammation and pain and immediately after any activity that aggravates your symptoms. Use ice packs or an ice massage. Heat may be used before performing stretching  and  strengthening activities prescribed by your caregiver. Use a heat pack or a warm soak. SEEK MEDICAL CARE IF: Symptoms get worse or do not improve in 2 weeks despite treatment. New, unexplained symptoms develop. Drugs used in treatment may produce side effects.  EXERCISES:  RANGE OF MOTION (ROM) AND STRETCHING EXERCISES - Achilles Tendinitis  These exercises may help you when beginning to rehabilitate your injury. Your symptoms may resolve with or without further involvement from your physician, physical therapist or athletic trainer. While completing these exercises, remember:  Restoring tissue flexibility helps normal motion to return to the joints. This allows healthier, less painful movement and activity. An effective stretch should be held for at least 30 seconds. A stretch should never be painful. You should only feel a gentle lengthening or release in the stretched tissue.  STRETCH  Gastroc, Standing  Place hands on wall. Extend right / left leg, keeping the front knee somewhat bent. Slightly point your toes inward on your back foot. Keeping your right / left heel on the floor and your knee straight, shift your weight toward the wall, not allowing your back to arch. You should feel a gentle stretch in the right / left calf. Hold this position for 10 seconds. Repeat 3 times. Complete this stretch 2 times per day.  STRETCH  Soleus, Standing  Place hands on wall. Extend right / left leg, keeping the other knee somewhat bent. Slightly point your toes inward on your back foot. Keep your right / left heel on the floor, bend your back knee, and slightly shift your weight over the back leg so that you feel a gentle stretch deep in your back calf. Hold this position for 10 seconds. Repeat 3 times. Complete this stretch 2 times per day.  STRETCH  Gastrocsoleus, Standing  Note: This exercise can place a lot of stress on your foot and ankle. Please complete this exercise only if specifically  instructed by your caregiver.  Place the ball of your right / left foot on a step, keeping your other foot firmly on the same step. Hold on to the wall or a rail for balance. Slowly lift your other foot, allowing your body weight to press your heel down over the edge of the step. You should feel a stretch in your right / left calf. Hold this position for 10 seconds. Repeat this exercise with a slight bend in your knee. Repeat 3 times. Complete this stretch 2 times per day.   STRENGTHENING EXERCISES - Achilles Tendinitis These exercises may help you when beginning to rehabilitate your injury. They may resolve your symptoms with or without further involvement from your physician, physical therapist or athletic trainer. While completing these exercises, remember:  Muscles can gain both the endurance and the strength needed for everyday activities through controlled exercises. Complete these exercises as instructed by your physician, physical therapist or athletic trainer. Progress the resistance and repetitions only as guided. You may experience muscle soreness or fatigue, but the pain or discomfort you are trying to eliminate should never worsen during these exercises. If this pain does worsen, stop and make certain you are following the directions exactly. If the pain is still present after adjustments, discontinue the exercise until you can discuss the trouble with your clinician.  STRENGTH - Plantar-flexors  Sit with your right / left leg extended. Holding onto both ends of a rubber exercise band/tubing, loop it around the ball of your foot. Keep a slight tension in the band. Slowly  push your toes away from you, pointing them downward. Hold this position for 10 seconds. Return slowly, controlling the tension in the band/tubing. Repeat 3 times. Complete this exercise 2 times per day.   STRENGTH - Plantar-flexors  Stand with your feet shoulder width apart. Steady yourself with a wall or table  using as little support as needed. Keeping your weight evenly spread over the width of your feet, rise up on your toes.* Hold this position for 10 seconds. Repeat 3 times. Complete this exercise 2 times per day.  *If this is too easy, shift your weight toward your right / left leg until you feel challenged. Ultimately, you may be asked to do this exercise with your right / left foot only.  STRENGTH  Plantar-flexors, Eccentric  Note: This exercise can place a lot of stress on your foot and ankle. Please complete this exercise only if specifically instructed by your caregiver.  Place the balls of your feet on a step. With your hands, use only enough support from a wall or rail to keep your balance. Keep your knees straight and rise up on your toes. Slowly shift your weight entirely to your right / left toes and pick up your opposite foot. Gently and with controlled movement, lower your weight through your right / left foot so that your heel drops below the level of the step. You will feel a slight stretch in the back of your calf at the end position. Use the healthy leg to help rise up onto the balls of both feet, then lower weight only on the right / left leg again. Build up to 15 repetitions. Then progress to 3 consecutive sets of 15 repetitions.* After completing the above exercise, complete the same exercise with a slight knee bend (about 30 degrees). Again, build up to 15 repetitions. Then progress to 3 consecutive sets of 15 repetitions.* Perform this exercise 2 times per day.  *When you easily complete 3 sets of 15, your physician, physical therapist or athletic trainer may advise you to add resistance by wearing a backpack filled with additional weight.  STRENGTH - Plantar Flexors, Seated  Sit on a chair that allows your feet to rest flat on the ground. If necessary, sit at the edge of the chair. Keeping your toes firmly on the ground, lift your right / left heel as far as you can without  increasing any discomfort in your ankle. Repeat 3 times. Complete this exercise 2 times a day.    Continue to tape around the second toe to splint the second MPJ joint and add stability.  Also pad the ball of your foot with metatarsal pads.  Continue this over the next 2 to 4 weeks.

## 2024-05-10 ENCOUNTER — Ambulatory Visit

## 2024-05-10 ENCOUNTER — Ambulatory Visit: Attending: Internal Medicine | Admitting: Internal Medicine

## 2024-05-10 ENCOUNTER — Encounter: Payer: Self-pay | Admitting: Internal Medicine

## 2024-05-10 VITALS — BP 110/76 | HR 47 | Ht 66.0 in | Wt 184.8 lb

## 2024-05-10 DIAGNOSIS — R001 Bradycardia, unspecified: Secondary | ICD-10-CM | POA: Diagnosis not present

## 2024-05-10 NOTE — Patient Instructions (Addendum)
 Medication Instructions:  Your physician recommends that you continue on your current medications as directed. Please refer to the Current Medication list given to you today.  *If you need a refill on your cardiac medications before your next appointment, please call your pharmacy*  Testing/Procedures: HEART MONITOR Your physician has requested that you wear a Zio heart monitor for 3 days. This will be mailed to your home with instructions on how to apply the monitor and how to return it when finished. Please allow 2 weeks after returning the heart monitor before our office calls you with the results.   Follow-Up: At [facility], you and your health needs are our priority.  As part of our continuing mission to provide you with exceptional heart care, our providers are all part of one team.  This team includes your primary Cardiologist (physician) and Advanced Practice Providers or APPs (Physician Assistants and Nurse Practitioners) who all work together to provide you with the care you need, when you need it.  Your next appointment:   9-10 month(s)  Provider:   Vina Gull, MD   We recommend signing up for the patient portal called MyChart.  Sign up information is provided on this After Visit Summary.  MyChart is used to connect with patients for Virtual Visits (Telemedicine).  Patients are able to view lab/test results, encounter notes, upcoming appointments, etc.  Non-urgent messages can be sent to your provider as well.    To learn more about what you can do with MyChart, go to [email or web address].   Other Instructions  ZIO XT- Long Term Monitor Instructions  Your physician has requested you wear a ZIO patch monitor for 3 days.   This is a single patch monitor. [employer] supplies one patch monitor per enrollment. Additional  stickers are not available. Please do not apply patch if you will be having a Nuclear Stress Test,  Echocardiogram, Cardiac CT, MRI, or Chest Xray  during the period you would be wearing the  monitor. The patch cannot be worn during these tests. You cannot remove and re-apply the  ZIO XT patch monitor.   Your ZIO patch monitor will be mailed 3 day USPS to your address on file. It may take 3-5 days  to receive your monitor after you have been enrolled.   Once you have received your monitor, please review the enclosed instructions. Your monitor  has already been registered assigning a specific monitor serial # to you.     Billing and Patient Assistance Program Information  We have supplied [employer] with any of your insurance information on file for billing purposes.  [employer] offers a sliding scale Patient Assistance Program for patients that do not have  insurance, or whose insurance does not completely cover the cost of the ZIO monitor.  You must apply for the Patient Assistance Program to qualify for this discounted rate.   To apply, please call [employer] at [phone], select option 4, select option 2, ask to apply for  Patient Assistance Program. Meredeth will ask your household income, and how many people  are in your household. They will quote your out-of-pocket cost based on that information.  [employer] will also be able to set up a 22-month, interest-free payment plan if needed.     Applying the monitor  Shave hair from upper left chest.  Hold abrader disc by orange tab. Rub abrader in 40 strokes over the upper left chest as  indicated in your monitor instructions.  Clean area with 4 enclosed  alcohol pads. Let dry.  Apply patch as indicated in monitor instructions. Patch will be placed under collarbone on left  side of chest with arrow pointing upward.  Rub patch adhesive wings for 2 minutes. Remove white label marked 1. Remove the white  label marked 2. Rub patch adhesive wings for 2 additional minutes.  While looking in a mirror, press and release button in center of patch. A small green light will  flash 3-4 times. This  will be your only indicator that the monitor has been turned on.  Do not shower for the first 24 hours. You may shower after the first 24 hours.  Press the button if you feel a symptom. You will hear a small click. Record Date, Time and  Symptom in the Patient Logbook.  When you are ready to remove the patch, follow instructions on the last 2 pages of Patient  Logbook. Stick patch monitor onto the last page of Patient Logbook.   Place Patient Logbook in the blue and white box. Use locking tab on box and tape box closed  securely. The blue and white box has prepaid postage on it. Please place it in the mailbox as  soon as possible. Your physician should have your test results approximately 7 days after the  monitor has been mailed back to [facility].   Call [facility] at [phone] if you have questions regarding  your ZIO XT patch monitor. Call them immediately if you see an orange light blinking on your  monitor.   If your monitor falls off in less than 4 days, contact our Monitor department at [phone].   If your monitor becomes loose or falls off after 4 days call [employer] at [phone] for  suggestions on securing your monitor.
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
## 2024-05-10 NOTE — Progress Notes (Unsigned)
 Enrolled for [employer] to mail a ZIO XT long term holter monitor to the patients address on file.

## 2024-05-11 ENCOUNTER — Ambulatory Visit: Attending: Nurse Practitioner

## 2024-05-11 VITALS — BP 111/76 | Wt 184.0 lb

## 2024-05-11 DIAGNOSIS — Z0000 Encounter for general adult medical examination without abnormal findings: Secondary | ICD-10-CM

## 2024-05-11 NOTE — Patient Instructions (Signed)
 Mr. Theurer,  Thank you for taking the time for your Medicare Wellness Visit. I appreciate your continued commitment to your health goals. Please review the care plan we discussed, and feel free to reach out if I can assist you further.  Please note that Annual Wellness Visits do not include a physical exam. Some assessments may be limited, especially if the visit was conducted virtually. If needed, we may recommend an in-person follow-up with your provider.  Ongoing Care Seeing your primary care provider every 3 to 6 months helps us  monitor your health and provide consistent, personalized care.   Referrals If a referral was made during today's visit and you haven't received any updates within two weeks, please contact the referred provider directly to check on the status.  Recommended Screenings:  Health Maintenance  Topic Date Due   Complete foot exam   Never done   Eye exam for diabetics  Never done   Zoster (Shingles) Vaccine (1 of 2) Never done   Medicare Annual Wellness Visit  12/17/2023   COVID-19 Vaccine (4 - 2025-26 season) 03/01/2024   Pneumococcal Vaccine for age over 42 (2 of 2 - PCV) 09/23/2024*   Flu Shot  09/28/2024*   Hemoglobin A1C  06/24/2024   Yearly kidney health urinalysis for diabetes  02/09/2025   Yearly kidney function blood test for diabetes  03/24/2025   DTaP/Tdap/Td vaccine (3 - Td or Tdap) 11/01/2027   Colon Cancer Screening  09/08/2028   Hepatitis C Screening  Completed   Meningitis B Vaccine  Aged Out  *Topic was postponed. The date shown is not the original due date.       05/11/2024    2:17 PM  Advanced Directives  Does Patient Have a Medical Advance Directive? No  Would patient like information on creating a medical advance directive? No - Patient declined    Vision: Annual vision screenings are recommended for early detection of glaucoma, cataracts, and diabetic retinopathy. These exams can also reveal signs of chronic conditions such as  diabetes and high blood pressure.  Dental: Annual dental screenings help detect early signs of oral cancer, gum disease, and other conditions linked to overall health, including heart disease and diabetes.  Please see the attached documents for additional preventive care recommendations.
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
## 2024-05-13 ENCOUNTER — Other Ambulatory Visit: Payer: Self-pay

## 2024-05-13 ENCOUNTER — Encounter: Payer: Self-pay | Admitting: Internal Medicine

## 2024-05-26 ENCOUNTER — Other Ambulatory Visit: Payer: Self-pay

## 2024-05-26 MED ORDER — SPIRONOLACTONE 25 MG PO TABS
25.0000 mg | ORAL_TABLET | Freq: Every day | ORAL | 2 refills | Status: DC
  Filled 2024-05-26: qty 30, 30d supply, fill #0

## 2024-05-31 ENCOUNTER — Other Ambulatory Visit: Payer: Self-pay | Admitting: Nurse Practitioner

## 2024-05-31 DIAGNOSIS — M79644 Pain in right finger(s): Secondary | ICD-10-CM

## 2024-06-01 ENCOUNTER — Other Ambulatory Visit: Payer: Self-pay

## 2024-06-01 ENCOUNTER — Encounter

## 2024-06-01 ENCOUNTER — Ambulatory Visit: Payer: Self-pay | Admitting: Internal Medicine

## 2024-06-01 DIAGNOSIS — R001 Bradycardia, unspecified: Secondary | ICD-10-CM

## 2024-06-12 ENCOUNTER — Encounter: Payer: Self-pay | Admitting: Nurse Practitioner

## 2024-06-15 ENCOUNTER — Ambulatory Visit: Admitting: Orthopedic Surgery

## 2024-06-15 DIAGNOSIS — M65311 Trigger thumb, right thumb: Secondary | ICD-10-CM
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
## 2024-06-17 ENCOUNTER — Encounter: Payer: Self-pay | Admitting: Internal Medicine

## 2024-06-17 ENCOUNTER — Other Ambulatory Visit: Payer: Self-pay

## 2024-06-21 ENCOUNTER — Ambulatory Visit: Attending: Family Medicine | Admitting: Family Medicine

## 2024-06-21 ENCOUNTER — Encounter: Payer: Self-pay | Admitting: Family Medicine

## 2024-06-21 ENCOUNTER — Ambulatory Visit: Payer: Self-pay

## 2024-06-21 VITALS — BP 176/99 | HR 56 | Temp 98.0°F | Ht 66.0 in | Wt 186.0 lb

## 2024-06-21 DIAGNOSIS — I10 Essential (primary) hypertension: Secondary | ICD-10-CM | POA: Diagnosis not present

## 2024-06-21 DIAGNOSIS — R519 Headache, unspecified: Secondary | ICD-10-CM

## 2024-06-21 NOTE — Patient Instructions (Signed)
 Managing Your Hypertension Hypertension, also called high blood pressure, is when the force of the blood pressing against the walls of the arteries is too strong. Arteries are blood vessels that carry blood from your heart throughout your body. Hypertension forces the heart to work harder to pump blood and may cause the arteries to become narrow or stiff. Understanding blood pressure readings A blood pressure reading includes a higher number over a lower number: The first, or top, number is called the systolic pressure. It is a measure of the pressure in your arteries as your heart beats. The second, or bottom number, is called the diastolic pressure. It is a measure of the pressure in your arteries as the heart relaxes. For most people, a normal blood pressure is below 120/80. Your personal target blood pressure may vary depending on your medical conditions, your age, and other factors. Blood pressure is classified into four stages. Based on your blood pressure reading, your health care provider may use the following stages to determine what type of treatment you need, if any. Systolic pressure and diastolic pressure are measured in a unit called millimeters of mercury (mmHg). Normal Systolic pressure: below 120. Diastolic pressure: below 80. Elevated Systolic pressure: 120-129. Diastolic pressure: below 80. Hypertension stage 1 Systolic pressure: 130-139. Diastolic pressure: 80-89. Hypertension stage 2 Systolic pressure: 140 or above. Diastolic pressure: 90 or above. How can this condition affect me? Managing your hypertension is very important. Over time, hypertension can damage the arteries and decrease blood flow to parts of the body, including the brain, heart, and kidneys. Having untreated or uncontrolled hypertension can lead to: A heart attack. A stroke. A weakened blood vessel (aneurysm). Heart failure. Kidney damage. Eye damage. Memory and concentration problems. Vascular  dementia. What actions can I take to manage this condition? Hypertension can be managed by making lifestyle changes and possibly by taking medicines. Your health care provider will help you make a plan to bring your blood pressure within a normal range. You may be referred for counseling on a healthy diet and physical activity. Nutrition  Eat a diet that is high in fiber and potassium, and low in salt (sodium), added sugar, and fat. An example eating plan is called the DASH diet. DASH stands for Dietary Approaches to Stop Hypertension. To eat this way: Eat plenty of fresh fruits and vegetables. Try to fill one-half of your plate at each meal with fruits and vegetables. Eat whole grains, such as whole-wheat pasta, brown rice, or whole-grain bread. Fill about one-fourth of your plate with whole grains. Eat low-fat dairy products. Avoid fatty cuts of meat, processed or cured meats, and poultry with skin. Fill about one-fourth of your plate with lean proteins such as fish, chicken without skin, beans, eggs, and tofu. Avoid pre-made and processed foods. These tend to be higher in sodium, added sugar, and fat. Reduce your daily sodium intake. Many people with hypertension should eat less than 1,500 mg of sodium a day. Lifestyle  Work with your health care provider to maintain a healthy body weight or to lose weight. Ask what an ideal weight is for you. Get at least 30 minutes of exercise that causes your heart to beat faster (aerobic exercise) most days of the week. Activities may include walking, swimming, or biking. Include exercise to strengthen your muscles (resistance exercise), such as weight lifting, as part of your weekly exercise routine. Try to do these types of exercises for 30 minutes at least 3 days a week. Do  not use any products that contain nicotine  or tobacco. These products include cigarettes, chewing tobacco, and vaping devices, such as e-cigarettes. If you need help quitting, ask your  health care provider. Control any long-term (chronic) conditions you have, such as high cholesterol or diabetes. Identify your sources of stress and find ways to manage stress. This may include meditation, deep breathing, or making time for fun activities. Alcohol use Do not drink alcohol if: Your health care provider tells you not to drink. You are pregnant, may be pregnant, or are planning to become pregnant. If you drink alcohol: Limit how much you have to: 0-1 drink a day for women. 0-2 drinks a day for men. Know how much alcohol is in your drink. In the [place] one drink equals one 12 oz bottle of beer (355 mL), one 5 oz glass of wine (148 mL), or one 1 oz glass of hard liquor (44 mL). Medicines Your health care provider may prescribe medicine if lifestyle changes are not enough to get your blood pressure under control and if: Your systolic blood pressure is 130 or higher. Your diastolic blood pressure is 80 or higher. Take medicines only as told by your health care provider. Follow the directions carefully. Blood pressure medicines must be taken as told by your health care provider. The medicine does not work as well when you skip doses. Skipping doses also puts you at risk for problems. Monitoring Before you monitor your blood pressure: Do not smoke, drink caffeinated beverages, or exercise within 30 minutes before taking a measurement. Use the bathroom and empty your bladder (urinate). Sit quietly for at least 5 minutes before taking measurements. Monitor your blood pressure at home as told by your health care provider. To do this: Sit with your back straight and supported. Place your feet flat on the floor. Do not cross your legs. Support your arm on a flat surface, such as a table. Make sure your upper arm is at heart level. Each time you measure, take two or three readings one minute apart and record the results. You may also need to have your blood pressure checked regularly by  your health care provider. General information Talk with your health care provider about your diet, exercise habits, and other lifestyle factors that may be contributing to hypertension. Review all the medicines you take with your health care provider because there may be side effects or interactions. Keep all follow-up visits. Your health care provider can help you create and adjust your plan for managing your high blood pressure. Where to find more information National Heart, Lung, and Blood Institute: [employer]is American Heart Association: www.heart.org Contact a health care provider if: You think you are having a reaction to medicines you have taken. You have repeated (recurrent) headaches. You feel dizzy. You have swelling in your ankles. You have trouble with your vision. Get help right away if: You develop a severe headache or confusion. You have unusual weakness or numbness, or you feel faint. You have severe pain in your chest or abdomen. You vomit repeatedly. You have trouble breathing. These symptoms may be an emergency. Get help right away. Call 911. Do not wait to see if the symptoms will go away. Do not drive yourself to the hospital. Summary Hypertension is when the force of blood pumping through your arteries is too strong. If this condition is not controlled, it may put you at risk for serious complications. Your personal target blood pressure may vary depending on your medical conditions,  your age, and other factors. For most people, a normal blood pressure is less than 120/80. Hypertension is managed by lifestyle changes, medicines, or both. Lifestyle changes to help manage hypertension include losing weight, eating a healthy, low-sodium diet, exercising more, stopping smoking, and limiting alcohol. This information is not intended to replace advice given to you by your health care provider. Make sure you discuss any questions you have with your health care  provider. Document Revised: 03/01/2021 Document Reviewed: 03/01/2021 Elsevier Patient Education  2024 [employer].
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
## 2024-06-21 NOTE — Telephone Encounter (Signed)
 FYI Only or Action Required?: FYI only for provider: appointment scheduled on today.  Patient was last seen in primary care on 04/26/2024 by Theotis Haze ORN, NP.  Called Nurse Triage reporting Headache.  Symptoms began several days ago.  Interventions attempted: Nothing.  Symptoms are: gradually worsening.  Triage Disposition: See Physician Within 24 Hours  Patient/caregiver understands and will follow disposition?: Yes, will follow disposition  Copied from CRM #[id number]. Topic: Clinical - Red Word Triage >> Jun 21, 2024  9:49 AM Shanda MATSU wrote: Red Word that prompted transfer to Nurse Triage: Patient is reporting that 1-2 days after he started taking med, spironolactone  (ALDACTONE ) 25 MG tablet, he began experiencing SOB, patient stated he stopped taking it about 3 days ago and the SOB has stopped but now he is experiencing a head ache that has not gone away. Patient stated that he started taking this med, about 5 days before he stopped it so around 06/13/2024. Reason for Disposition  [1] MODERATE headache (e.g., interferes with normal activities) AND [2] present > 24 hours AND [3] unexplained  (Exceptions: Pain medicines not tried, typical migraine, or headache part of viral illness.)  Answer Assessment - Initial Assessment Questions 1. LOCATION: Where does it hurt?      Back of head 2. ONSET: When did the headache start? (e.g., minutes, hours, days)      1-2 days 3. PATTERN: Does the pain come and go, or has it been constant since it started?     constant 4. SEVERITY: How bad is the pain? and What does it keep you from doing?  (e.g., Scale 1-10; mild, moderate, or severe)     7 5. RECURRENT SYMPTOM: Have you ever had headaches before? If Yes, ask: When was the last time? and What happened that time?      Hx of migraines, but that was in front of head 6. CAUSE: What do you think is causing the headache?     Pt states that he recently started and stopped  spironolactone ,  7. MIGRAINE: Have you been diagnosed with migraine headaches? If Yes, ask: Is this headache similar?      Yes hx of migraines states this does not feel like previous migraine 8. HEAD INJURY: Has there been any recent injury to your head?      denies 9. OTHER SYMPTOMS: Do you have any other symptoms? (e.g., fever, stiff neck, eye pain, sore throat, cold symptoms)     Denies  Denies vision changes, denies speech changes.  Protocols used: [facility]

## 2024-06-25 ENCOUNTER — Other Ambulatory Visit: Payer: Self-pay

## 2024-06-28 ENCOUNTER — Other Ambulatory Visit: Payer: Self-pay

## 2024-07-11 ENCOUNTER — Other Ambulatory Visit: Payer: Self-pay

## 2024-07-12 ENCOUNTER — Other Ambulatory Visit: Payer: Self-pay

## 2024-07-16 ENCOUNTER — Other Ambulatory Visit: Payer: Self-pay

## 2024-07-26 ENCOUNTER — Encounter: Payer: Self-pay | Admitting: Nurse Practitioner

## 2024-07-27 ENCOUNTER — Ambulatory Visit: Admitting: Orthopedic Surgery

## 2024-07-27 ENCOUNTER — Encounter: Payer: Self-pay | Admitting: Nurse Practitioner

## 2024-07-27 ENCOUNTER — Other Ambulatory Visit: Payer: Self-pay

## 2024-07-27 ENCOUNTER — Other Ambulatory Visit: Payer: Self-pay | Admitting: Nurse Practitioner

## 2024-07-27 ENCOUNTER — Ambulatory Visit: Attending: Nurse Practitioner | Admitting: Nurse Practitioner

## 2024-07-27 VITALS — BP 151/95 | HR 62 | Ht 66.0 in | Wt 187.6 lb

## 2024-07-27 DIAGNOSIS — R7303 Prediabetes: Secondary | ICD-10-CM

## 2024-07-27 DIAGNOSIS — M545 Low back pain, unspecified: Secondary | ICD-10-CM | POA: Diagnosis not present

## 2024-07-27 DIAGNOSIS — I10 Essential (primary) hypertension: Secondary | ICD-10-CM

## 2024-07-27 MED ORDER — BACLOFEN 10 MG PO TABS
10.0000 mg | ORAL_TABLET | Freq: Three times a day (TID) | ORAL | 0 refills | Status: AC
  Filled 2024-07-27: qty 30, 10d supply, fill #0

## 2024-07-27 MED ORDER — MELOXICAM 7.5 MG PO TABS
7.5000 mg | ORAL_TABLET | Freq: Every day | ORAL | 0 refills | Status: AC
  Filled 2024-07-27: qty 30, 30d supply, fill #0
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
## 2024-07-28 LAB — URINALYSIS, COMPLETE
Bilirubin, UA: NEGATIVE
Ketones, UA: NEGATIVE
Leukocytes,UA: NEGATIVE
Nitrite, UA: NEGATIVE
RBC, UA: NEGATIVE
Urobilinogen, Ur: 0.2 mg/dL (ref 0.2–1.0)
pH, UA: 5.5 (ref 5.0–7.5)

## 2024-07-28 LAB — MICROSCOPIC EXAMINATION
Bacteria, UA: NONE SEEN
Casts: NONE SEEN /LPF
RBC, Urine: NONE SEEN /HPF (ref 0–2)
WBC, UA: NONE SEEN /HPF (ref 0–5)

## 2024-07-28 LAB — CMP14+EGFR
ALT: 14 [IU]/L (ref 0–44)
AST: 22 [IU]/L (ref 0–40)
Albumin: 4.3 g/dL (ref 3.9–4.9)
Alkaline Phosphatase: 78 [IU]/L (ref 47–123)
BUN/Creatinine Ratio: 15 (ref 10–24)
BUN: 29 mg/dL — ABNORMAL HIGH (ref 8–27)
Bilirubin Total: 0.7 mg/dL (ref 0.0–1.2)
CO2: 22 mmol/L (ref 20–29)
Calcium: 9.5 mg/dL (ref 8.6–10.2)
Chloride: 105 mmol/L (ref 96–106)
Creatinine, Ser: 1.94 mg/dL — ABNORMAL HIGH (ref 0.76–1.27)
Globulin, Total: 2.8 g/dL (ref 1.5–4.5)
Glucose: 74 mg/dL (ref 70–99)
Potassium: 3.8 mmol/L (ref 3.5–5.2)
Sodium: 144 mmol/L (ref 134–144)
Total Protein: 7.1 g/dL (ref 6.0–8.5)
eGFR: 37 mL/min/{1.73_m2} — ABNORMAL LOW

## 2024-07-28 LAB — HEMOGLOBIN A1C
Est. average glucose Bld gHb Est-mCnc: 120 mg/dL
Hgb A1c MFr Bld: 5.8 % — ABNORMAL HIGH (ref 4.8–5.6)

## 2024-07-29 ENCOUNTER — Ambulatory Visit: Admitting: Orthopedic Surgery

## 2024-07-29 DIAGNOSIS — M65311 Trigger thumb, right thumb: Secondary | ICD-10-CM

## 2024-07-29 NOTE — Progress Notes (Signed)
 Right thumb trigger injection follow up

## 2024-08-03 ENCOUNTER — Encounter: Payer: Self-pay | Admitting: Nurse Practitioner

## 2024-08-03 ENCOUNTER — Ambulatory Visit: Payer: Self-pay | Admitting: Nurse Practitioner

## 2024-08-05 ENCOUNTER — Other Ambulatory Visit: Payer: Self-pay

## 2024-10-25 ENCOUNTER — Ambulatory Visit: Payer: Self-pay | Admitting: Nurse Practitioner
